# Patient Record
Sex: Male | Born: 1965 | Race: Black or African American | Hispanic: No | Smoking: Current every day smoker
Health system: Southern US, Community
[De-identification: ages and names within clinical notes are randomized; demographics above are authoritative.]

## PROBLEM LIST (undated history)

## (undated) DIAGNOSIS — J111 Influenza due to unidentified influenza virus with other respiratory manifestations: Secondary | ICD-10-CM

## (undated) DIAGNOSIS — G8929 Other chronic pain: Secondary | ICD-10-CM

## (undated) DIAGNOSIS — F32A Depression, unspecified: Secondary | ICD-10-CM

## (undated) DIAGNOSIS — Z9689 Presence of other specified functional implants: Secondary | ICD-10-CM

## (undated) DIAGNOSIS — M549 Dorsalgia, unspecified: Secondary | ICD-10-CM

## (undated) HISTORY — PX: SPINE SURGERY: SHX786

## (undated) HISTORY — DX: Depression, unspecified: F32.A

## (undated) HISTORY — DX: Influenza due to unidentified influenza virus with other respiratory manifestations: J11.1

## (undated) HISTORY — PX: FRACTURE SURGERY: SHX138

---
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
## 2007-06-15 ENCOUNTER — Ambulatory Visit: Payer: Self-pay | Admitting: Family Medicine

## 2013-12-06 ENCOUNTER — Emergency Department: Payer: Self-pay | Admitting: Emergency Medicine

## 2013-12-09 ENCOUNTER — Emergency Department: Payer: Self-pay | Admitting: Student

## 2014-05-16 ENCOUNTER — Emergency Department: Payer: Self-pay | Admitting: Emergency Medicine

## 2014-05-16 LAB — URINALYSIS, COMPLETE
BACTERIA: NONE SEEN
Bilirubin,UR: NEGATIVE
Blood: NEGATIVE
GLUCOSE, UR: NEGATIVE mg/dL (ref 0–75)
KETONE: NEGATIVE
Leukocyte Esterase: NEGATIVE
Nitrite: NEGATIVE
Ph: 6 (ref 4.5–8.0)
Protein: 30
RBC,UR: 1 /HPF (ref 0–5)
SPECIFIC GRAVITY: 1.032 (ref 1.003–1.030)
Squamous Epithelial: <1
WBC UR: 2 /HPF (ref 0–5)

## 2014-05-27 ENCOUNTER — Emergency Department: Payer: Self-pay | Admitting: Emergency Medicine

## 2014-05-27 HISTORY — PX: BACK SURGERY: SHX140

## 2014-05-29 ENCOUNTER — Ambulatory Visit: Payer: Self-pay | Admitting: Family Medicine

## 2015-01-13 ENCOUNTER — Other Ambulatory Visit: Payer: Self-pay | Admitting: Neurosurgery

## 2015-01-17 NOTE — Pre-Procedure Instructions (Addendum)
Wesley Adams  01/17/2015     No Pharmacies Listed   Your procedure is scheduled on Wed, Sept 28 @ 10:30 AM  Report to [facility] Admitting at 7:30 AM.  Call this number if you have problems the morning of surgery:  [phone]   Remember:  Do not eat food or drink liquids after midnight.  Take these medicines the morning of surgery with A SIP OF WATER  ---Pain medicine as needed             No Goody's,BC's,Aspirin,Ibuprofen,Aleve,Fish Oil,or any Herbal Medications.   Do not wear jewelry.  Do not wear lotions, powders, or colognes.  You may wear deodorant.  Men may shave face and neck.  Do not bring valuables to the hospital.  [facility] is not responsible for any belongings or valuables.  Contacts, dentures or bridgework may not be worn into surgery.  Leave your suitcase in the car.  After surgery it may be brought to your room.  For patients admitted to the hospital, discharge time will be determined by your treatment team.  Patients discharged the day of surgery will not be allowed to drive home.    Special instructions:  [place] - Preparing for Surgery  Before surgery, you can play an important role.  Because skin is not sterile, your skin needs to be as free of germs as possible.  You can reduce the number of germs on you skin by washing with CHG (chlorahexidine gluconate) soap before surgery.  CHG is an antiseptic cleaner which kills germs and bonds with the skin to continue killing germs even after washing.  Please DO NOT use if you have an allergy to CHG or antibacterial soaps.  If your skin becomes reddened/irritated stop using the CHG and inform your nurse when you arrive at Short Stay.  Do not shave (including legs and underarms) for at least 48 hours prior to the first CHG shower.  You may shave your face.  Please follow these instructions carefully:   1.  Shower with CHG Soap the night before surgery and the                                 morning of Surgery.  2.  If you choose to wash your hair, wash your hair first as usual with your       normal shampoo.  3.  After you shampoo, rinse your hair and body thoroughly to remove the                      Shampoo.  4.  Use CHG as you would any other liquid soap.  You can apply chg directly       to the skin and wash gently with scrungie or a clean washcloth.  5.  Apply the CHG Soap to your body ONLY FROM THE NECK DOWN.        Do not use on open wounds or open sores.  Avoid contact with your eyes,       ears, mouth and genitals (private parts).  Wash genitals (private parts)       with your normal soap.  6.  Wash thoroughly, paying special attention to the area where your surgery        will be performed.  7.  Thoroughly rinse your body with warm water from the neck down.  8.  DO NOT shower/wash with your normal soap after using and rinsing off       the CHG Soap.  9.  Pat yourself dry with a clean towel.            10.  Wear clean pajamas.            11.  Place clean sheets on your bed the night of your first shower and do not        sleep with pets.  Day of Surgery  Do not apply any lotions/deoderants the morning of surgery.  Please wear clean clothes to the hospital/surgery center.   Please read over the following fact sheets that you were given. Pain Booklet, Coughing and Deep Breathing, Blood Transfusion Information, Surgical Site Infection Prevention and Anesthesia Post-op Instructions

## 2015-01-20 ENCOUNTER — Encounter (HOSPITAL_COMMUNITY): Payer: Self-pay

## 2015-01-20 ENCOUNTER — Encounter (HOSPITAL_COMMUNITY)
Admission: RE | Admit: 2015-01-20 | Discharge: 2015-01-20 | Disposition: A | Discharge status: Home / Self Care | Payer: BLUE CROSS/BLUE SHIELD | Source: Ambulatory Visit | Attending: Neurosurgery | Admitting: Neurosurgery

## 2015-01-20 DIAGNOSIS — Z0183 Encounter for blood typing: Secondary | ICD-10-CM | POA: Diagnosis not present

## 2015-01-20 DIAGNOSIS — Z01812 Encounter for preprocedural laboratory examination: Secondary | ICD-10-CM | POA: Insufficient documentation

## 2015-01-20 DIAGNOSIS — M5136 Other intervertebral disc degeneration, lumbar region: Secondary | ICD-10-CM | POA: Diagnosis not present

## 2015-01-20 LAB — BASIC METABOLIC PANEL
Anion gap: 8 (ref 5–15)
BUN: 11 mg/dL (ref 6–20)
CALCIUM: 9.4 mg/dL (ref 8.9–10.3)
CHLORIDE: 106 mmol/L (ref 101–111)
CO2: 26 mmol/L (ref 22–32)
CREATININE: 0.81 mg/dL (ref 0.61–1.24)
Glucose, Bld: 95 mg/dL (ref 65–99)
Potassium: 4.3 mmol/L (ref 3.5–5.1)
SODIUM: 140 mmol/L (ref 135–145)

## 2015-01-20 LAB — TYPE AND SCREEN
ABO/RH(D): O POS
Antibody Screen: NEGATIVE

## 2015-01-20 LAB — SURGICAL PCR SCREEN
MRSA, PCR: NEGATIVE
Staphylococcus aureus: NEGATIVE

## 2015-01-20 LAB — CBC
HCT: 44 % (ref 39.0–52.0)
Hemoglobin: 14.8 g/dL (ref 13.0–17.0)
MCH: 30.5 pg (ref 26.0–34.0)
MCHC: 33.6 g/dL (ref 30.0–36.0)
MCV: 90.5 fL (ref 78.0–100.0)
PLATELETS: 189 10*3/uL (ref 150–400)
RBC: 4.86 MIL/uL (ref 4.22–5.81)
RDW: 14.3 % (ref 11.5–15.5)
WBC: 7.4 10*3/uL (ref 4.0–10.5)

## 2015-01-20 LAB — ABO/RH: ABO/RH(D): O POS

## 2015-01-20 NOTE — Progress Notes (Signed)
No cardiologist  PCP [place]

## 2015-01-20 NOTE — Pre-Procedure Instructions (Signed)
    TYSHAN ENDERLE  01/20/2015      [id number] - [place], [place] - [address] [place] [address] Phone: [phone] Fax: [phone]    Your procedure is scheduled on Wednesday Sept 28  Report to [facility] Admitting at  7:30 A.M.  Call this number if you have problems the morning of surgery:  [phone]   Remember:  Do not eat food or drink liquids after midnight.  Take these medicines the morning of surgery with A SIP OF WATER none   Do not wear jewelry, make-up or nail polish.  Do not wear lotions, powders, or perfumes.  You may wear deodorant.  Do not shave 48 hours prior to surgery.  Men may shave face and neck.  Do not bring valuables to the hospital.  [facility] is not responsible for any belongings or valuables.  Contacts, dentures or bridgework may not be worn into surgery.  Leave your suitcase in the car.  After surgery it may be brought to your room.  For patients admitted to the hospital, discharge time will be determined by your treatment team.  Patients discharged the day of surgery will not be allowed to drive home.    Special instructions:    Please read over the following fact sheets that you were given. Pain Booklet, Coughing and Deep Breathing, MRSA Information and Surgical Site Infection Prevention

## 2015-01-21 MED ORDER — CEFAZOLIN SODIUM-DEXTROSE 2-3 GM-% IV SOLR: 2.0000 g | INTRAVENOUS | Status: AC

## 2015-01-29 NOTE — Progress Notes (Signed)
Left message on pt's voicemail of time of arrival for tomorrow is 9:15 AM.

## 2015-01-30 ENCOUNTER — Inpatient Hospital Stay (HOSPITAL_COMMUNITY)
Admission: RE | Admit: 2015-01-30 | Discharge: 2015-01-31 | DRG: 460 | Disposition: A | Discharge status: Home / Self Care | Payer: BLUE CROSS/BLUE SHIELD | Source: Ambulatory Visit | Attending: Neurosurgery | Admitting: Neurosurgery

## 2015-01-30 ENCOUNTER — Encounter (HOSPITAL_COMMUNITY): Payer: Self-pay | Admitting: *Deleted

## 2015-01-30 ENCOUNTER — Inpatient Hospital Stay (HOSPITAL_COMMUNITY): Payer: BLUE CROSS/BLUE SHIELD | Admitting: Certified Registered"

## 2015-01-30 ENCOUNTER — Inpatient Hospital Stay (HOSPITAL_COMMUNITY): Payer: BLUE CROSS/BLUE SHIELD

## 2015-01-30 ENCOUNTER — Encounter (HOSPITAL_COMMUNITY)
Admission: RE | Disposition: A | Discharge status: Home / Self Care | Payer: BLUE CROSS/BLUE SHIELD | Source: Ambulatory Visit | Attending: Neurosurgery

## 2015-01-30 DIAGNOSIS — M5116 Intervertebral disc disorders with radiculopathy, lumbar region: Principal | ICD-10-CM | POA: Diagnosis present

## 2015-01-30 DIAGNOSIS — M5136 Other intervertebral disc degeneration, lumbar region: Secondary | ICD-10-CM | POA: Diagnosis present

## 2015-01-30 DIAGNOSIS — F17200 Nicotine dependence, unspecified, uncomplicated: Secondary | ICD-10-CM | POA: Diagnosis present

## 2015-01-30 DIAGNOSIS — M51369 Other intervertebral disc degeneration, lumbar region without mention of lumbar back pain or lower extremity pain: Secondary | ICD-10-CM | POA: Diagnosis present

## 2015-01-30 DIAGNOSIS — M549 Dorsalgia, unspecified: Secondary | ICD-10-CM

## 2015-01-30 SURGERY — POSTERIOR LUMBAR FUSION 1 LEVEL
Anesthesia: General | Site: Back

## 2015-01-30 MED ORDER — EPHEDRINE SULFATE 50 MG/ML IJ SOLN
INTRAMUSCULAR | Status: DC | PRN
  Administered 2015-01-30: 5 mg via INTRAVENOUS

## 2015-01-30 MED ORDER — CEFAZOLIN SODIUM-DEXTROSE 2-3 GM-% IV SOLR
2.0000 g | Freq: Three times a day (TID) | INTRAVENOUS | Status: AC
  Administered 2015-01-30 – 2015-01-31 (×2): 2 g via INTRAVENOUS
  Filled 2015-01-30 (×2): qty 50

## 2015-01-30 MED ORDER — FENTANYL CITRATE (PF) 250 MCG/5ML IJ SOLN
INTRAMUSCULAR | Status: AC
  Filled 2015-01-30: qty 5

## 2015-01-30 MED ORDER — BUPIVACAINE LIPOSOME 1.3 % IJ SUSP
INTRAMUSCULAR | Status: DC | PRN
  Administered 2015-01-30: 20 mL

## 2015-01-30 MED ORDER — 0.9 % SODIUM CHLORIDE (POUR BTL) OPTIME
TOPICAL | Status: DC | PRN
  Administered 2015-01-30: 1000 mL

## 2015-01-30 MED ORDER — ARTIFICIAL TEARS OP OINT
TOPICAL_OINTMENT | OPHTHALMIC | Status: DC | PRN
  Administered 2015-01-30: 1 via OPHTHALMIC

## 2015-01-30 MED ORDER — BUPIVACAINE-EPINEPHRINE (PF) 0.5% -1:200000 IJ SOLN
INTRAMUSCULAR | Status: DC | PRN
  Administered 2015-01-30: 10 mL via PERINEURAL

## 2015-01-30 MED ORDER — ONDANSETRON HCL 4 MG/2ML IJ SOLN: 4.0000 mg | INTRAMUSCULAR | Status: DC | PRN

## 2015-01-30 MED ORDER — MEPERIDINE HCL 25 MG/ML IJ SOLN: 6.2500 mg | INTRAMUSCULAR | Status: DC | PRN

## 2015-01-30 MED ORDER — ARTIFICIAL TEARS OP OINT
TOPICAL_OINTMENT | OPHTHALMIC | Status: AC
  Filled 2015-01-30: qty 3.5

## 2015-01-30 MED ORDER — VANCOMYCIN HCL 1000 MG IV SOLR
INTRAVENOUS | Status: DC | PRN
  Administered 2015-01-30: 1000 mg

## 2015-01-30 MED ORDER — BACITRACIN ZINC 500 UNIT/GM EX OINT
TOPICAL_OINTMENT | CUTANEOUS | Status: DC | PRN
  Administered 2015-01-30: 1 via TOPICAL

## 2015-01-30 MED ORDER — OXYCODONE-ACETAMINOPHEN 5-325 MG PO TABS
1.0000 | ORAL_TABLET | ORAL | Status: DC | PRN
  Administered 2015-01-31 (×3): 2 via ORAL
  Filled 2015-01-30 (×3): qty 2

## 2015-01-30 MED ORDER — PHENOL 1.4 % MT LIQD: 1.0000 | OROMUCOSAL | Status: DC | PRN

## 2015-01-30 MED ORDER — LACTATED RINGERS IV SOLN
INTRAVENOUS | Status: DC
  Administered 2015-01-30: 18:00:00 via INTRAVENOUS

## 2015-01-30 MED ORDER — BUPIVACAINE LIPOSOME 1.3 % IJ SUSP
20.0000 mL | Freq: Once | INTRAMUSCULAR | Status: DC
  Filled 2015-01-30: qty 20

## 2015-01-30 MED ORDER — MIDAZOLAM HCL 2 MG/2ML IJ SOLN
INTRAMUSCULAR | Status: AC
  Filled 2015-01-30: qty 4

## 2015-01-30 MED ORDER — THROMBIN 20000 UNITS EX SOLR
CUTANEOUS | Status: DC | PRN
  Administered 2015-01-30: 20 mL via TOPICAL

## 2015-01-30 MED ORDER — DEXAMETHASONE SODIUM PHOSPHATE 10 MG/ML IJ SOLN
INTRAMUSCULAR | Status: AC
  Filled 2015-01-30: qty 1

## 2015-01-30 MED ORDER — HYDROMORPHONE HCL 1 MG/ML IJ SOLN
INTRAMUSCULAR | Status: AC
  Filled 2015-01-30: qty 1

## 2015-01-30 MED ORDER — LIDOCAINE HCL (CARDIAC) 20 MG/ML IV SOLN
INTRAVENOUS | Status: DC | PRN
  Administered 2015-01-30: 100 mg via INTRAVENOUS

## 2015-01-30 MED ORDER — MORPHINE SULFATE (PF) 2 MG/ML IV SOLN
1.0000 mg | INTRAVENOUS | Status: DC | PRN
  Administered 2015-01-30: 2 mg via INTRAVENOUS
  Administered 2015-01-30 – 2015-01-31 (×2): 4 mg via INTRAVENOUS
  Filled 2015-01-30: qty 2
  Filled 2015-01-30: qty 1
  Filled 2015-01-30: qty 2

## 2015-01-30 MED ORDER — ONDANSETRON HCL 4 MG/2ML IJ SOLN
INTRAMUSCULAR | Status: AC
  Filled 2015-01-30: qty 2

## 2015-01-30 MED ORDER — ROCURONIUM BROMIDE 100 MG/10ML IV SOLN
INTRAVENOUS | Status: DC | PRN
  Administered 2015-01-30: 50 mg via INTRAVENOUS

## 2015-01-30 MED ORDER — DEXAMETHASONE SODIUM PHOSPHATE 10 MG/ML IJ SOLN
INTRAMUSCULAR | Status: DC | PRN
  Administered 2015-01-30: 10 mg via INTRAVENOUS

## 2015-01-30 MED ORDER — PROPOFOL 10 MG/ML IV BOLUS
INTRAVENOUS | Status: DC | PRN
  Administered 2015-01-30: 150 mg via INTRAVENOUS
  Administered 2015-01-30: 50 mg via INTRAVENOUS

## 2015-01-30 MED ORDER — CEFAZOLIN SODIUM-DEXTROSE 2-3 GM-% IV SOLR
INTRAVENOUS | Status: AC
Start: 2015-01-30 — End: 2015-01-30
  Administered 2015-01-30: 2 g via INTRAVENOUS
  Filled 2015-01-30: qty 50

## 2015-01-30 MED ORDER — MIDAZOLAM HCL 5 MG/5ML IJ SOLN
INTRAMUSCULAR | Status: DC | PRN
  Administered 2015-01-30: 2 mg via INTRAVENOUS

## 2015-01-30 MED ORDER — ALUM & MAG HYDROXIDE-SIMETH 200-200-20 MG/5ML PO SUSP: 30.0000 mL | Freq: Four times a day (QID) | ORAL | Status: DC | PRN

## 2015-01-30 MED ORDER — VANCOMYCIN HCL 1000 MG IV SOLR
INTRAVENOUS | Status: AC
  Filled 2015-01-30: qty 1000

## 2015-01-30 MED ORDER — ACETAMINOPHEN 650 MG RE SUPP: 650.0000 mg | RECTAL | Status: DC | PRN

## 2015-01-30 MED ORDER — DOCUSATE SODIUM 100 MG PO CAPS
100.0000 mg | ORAL_CAPSULE | Freq: Two times a day (BID) | ORAL | Status: DC
  Administered 2015-01-30 – 2015-01-31 (×2): 100 mg via ORAL
  Filled 2015-01-30 (×2): qty 1

## 2015-01-30 MED ORDER — HYDROCODONE-ACETAMINOPHEN 5-325 MG PO TABS
1.0000 | ORAL_TABLET | ORAL | Status: DC | PRN
Start: 2015-01-30 — End: 2015-01-31

## 2015-01-30 MED ORDER — BACITRACIN 50000 UNITS IM SOLR
INTRAMUSCULAR | Status: DC | PRN
  Administered 2015-01-30: 500 mL

## 2015-01-30 MED ORDER — ONDANSETRON HCL 4 MG/2ML IJ SOLN: 4.0000 mg | Freq: Once | INTRAMUSCULAR | Status: DC | PRN

## 2015-01-30 MED ORDER — DIAZEPAM 5 MG PO TABS: 5.0000 mg | ORAL_TABLET | Freq: Four times a day (QID) | ORAL | Status: DC | PRN

## 2015-01-30 MED ORDER — HYDROMORPHONE HCL 1 MG/ML IJ SOLN
0.2500 mg | INTRAMUSCULAR | Status: DC | PRN
Start: 2015-01-30 — End: 2015-01-30
  Administered 2015-01-30 (×2): 0.5 mg via INTRAVENOUS

## 2015-01-30 MED ORDER — MENTHOL 3 MG MT LOZG: 1.0000 | LOZENGE | OROMUCOSAL | Status: DC | PRN

## 2015-01-30 MED ORDER — BISACODYL 10 MG RE SUPP: 10.0000 mg | Freq: Every day | RECTAL | Status: DC | PRN

## 2015-01-30 MED ORDER — LACTATED RINGERS IV SOLN
INTRAVENOUS | Status: DC
  Administered 2015-01-30 (×3): via INTRAVENOUS

## 2015-01-30 MED ORDER — ONDANSETRON HCL 4 MG/2ML IJ SOLN
INTRAMUSCULAR | Status: DC | PRN
Start: 2015-01-30 — End: 2015-01-30
  Administered 2015-01-30: 4 mg via INTRAVENOUS

## 2015-01-30 MED ORDER — ACETAMINOPHEN 325 MG PO TABS: 650.0000 mg | ORAL_TABLET | ORAL | Status: DC | PRN

## 2015-01-30 MED ORDER — FENTANYL CITRATE (PF) 250 MCG/5ML IJ SOLN
INTRAMUSCULAR | Status: DC | PRN
  Administered 2015-01-30 (×5): 50 ug via INTRAVENOUS
  Administered 2015-01-30: 100 ug via INTRAVENOUS
  Administered 2015-01-30 (×6): 50 ug via INTRAVENOUS

## 2015-01-30 MED FILL — Heparin Sodium (Porcine) Inj 1000 Unit/ML: INTRAMUSCULAR | Qty: 30 | Status: AC

## 2015-01-30 MED FILL — Sodium Chloride IV Soln 0.9%: INTRAVENOUS | Qty: 1000 | Status: AC

## 2015-01-30 SURGICAL SUPPLY — 62 items
BAG DECANTER FOR FLEXI CONT (MISCELLANEOUS) ×2 IMPLANT
BENZOIN TINCTURE PRP APPL 2/3 (GAUZE/BANDAGES/DRESSINGS) ×2 IMPLANT
BLADE CLIPPER SURG (BLADE) IMPLANT
BRUSH SCRUB EZ PLAIN DRY (MISCELLANEOUS) ×2 IMPLANT
BUR MATCHSTICK NEURO 3.0 LAGG (BURR) ×2 IMPLANT
BUR PRECISION FLUTE 6.0 (BURR) ×2 IMPLANT
CAGE ALTERA 10X31X9-13 15D (Cage) ×2 IMPLANT
CANISTER SUCT 3000ML PPV (MISCELLANEOUS) ×2 IMPLANT
CAP REVERE LOCKING (Cap) ×8 IMPLANT
CONT SPEC 4OZ CLIKSEAL STRL BL (MISCELLANEOUS) ×2 IMPLANT
COVER BACK TABLE 60X90IN (DRAPES) ×2 IMPLANT
DRAPE C-ARM 42X72 X-RAY (DRAPES) ×4 IMPLANT
DRAPE LAPAROTOMY 100X72X124 (DRAPES) ×2 IMPLANT
DRAPE POUCH INSTRU U-SHP 10X18 (DRAPES) ×2 IMPLANT
DRAPE PROXIMA HALF (DRAPES) ×2 IMPLANT
DRAPE SURG 17X23 STRL (DRAPES) ×8 IMPLANT
ELECT BLADE 4.0 EZ CLEAN MEGAD (MISCELLANEOUS) ×2
ELECT REM PT RETURN 9FT ADLT (ELECTROSURGICAL) ×2
ELECTRODE BLDE 4.0 EZ CLN MEGD (MISCELLANEOUS) ×1 IMPLANT
ELECTRODE REM PT RTRN 9FT ADLT (ELECTROSURGICAL) ×1 IMPLANT
EVACUATOR 1/8 PVC DRAIN (DRAIN) IMPLANT
GAUZE SPONGE 4X4 12PLY STRL (GAUZE/BANDAGES/DRESSINGS) ×2 IMPLANT
GAUZE SPONGE 4X4 16PLY XRAY LF (GAUZE/BANDAGES/DRESSINGS) ×2 IMPLANT
GLOVE BIO SURGEON STRL SZ8 (GLOVE) ×4 IMPLANT
GLOVE BIO SURGEON STRL SZ8.5 (GLOVE) ×4 IMPLANT
GLOVE BIOGEL PI IND STRL 6.5 (GLOVE) ×1 IMPLANT
GLOVE BIOGEL PI INDICATOR 6.5 (GLOVE) ×1
GLOVE ECLIPSE 6.5 STRL STRAW (GLOVE) ×2 IMPLANT
GLOVE EXAM NITRILE LRG STRL (GLOVE) IMPLANT
GLOVE EXAM NITRILE MD LF STRL (GLOVE) IMPLANT
GLOVE EXAM NITRILE XL STR (GLOVE) IMPLANT
GLOVE EXAM NITRILE XS STR PU (GLOVE) IMPLANT
GOWN STRL REUS W/ TWL LRG LVL3 (GOWN DISPOSABLE) IMPLANT
GOWN STRL REUS W/ TWL XL LVL3 (GOWN DISPOSABLE) ×2 IMPLANT
GOWN STRL REUS W/TWL 2XL LVL3 (GOWN DISPOSABLE) IMPLANT
GOWN STRL REUS W/TWL LRG LVL3 (GOWN DISPOSABLE)
GOWN STRL REUS W/TWL XL LVL3 (GOWN DISPOSABLE) ×2
KIT BASIN OR (CUSTOM PROCEDURE TRAY) ×2 IMPLANT
KIT ROOM TURNOVER OR (KITS) ×2 IMPLANT
NEEDLE HYPO 21X1.5 SAFETY (NEEDLE) IMPLANT
NEEDLE HYPO 22GX1.5 SAFETY (NEEDLE) ×2 IMPLANT
NS IRRIG 1000ML POUR BTL (IV SOLUTION) ×2 IMPLANT
PACK LAMINECTOMY NEURO (CUSTOM PROCEDURE TRAY) ×2 IMPLANT
PAD ARMBOARD 7.5X6 YLW CONV (MISCELLANEOUS) ×6 IMPLANT
PATTIES SURGICAL .5 X1 (DISPOSABLE) IMPLANT
ROD REVERE 6.35 45MM (Rod) ×4 IMPLANT
SCREW 7.5X50MM (Screw) ×8 IMPLANT
SPONGE LAP 4X18 X RAY DECT (DISPOSABLE) IMPLANT
SPONGE NEURO XRAY DETECT 1X3 (DISPOSABLE) IMPLANT
SPONGE SURGIFOAM ABS GEL 100 (HEMOSTASIS) ×2 IMPLANT
STRIP BIOACTIVE 20CC 25X100X8 (Miscellaneous) ×2 IMPLANT
STRIP BIOACTIVE 5CC 25X50X4MM (Miscellaneous) ×2 IMPLANT
STRIP CLOSURE SKIN 1/2X4 (GAUZE/BANDAGES/DRESSINGS) ×2 IMPLANT
SUT VIC AB 1 CT1 18XBRD ANBCTR (SUTURE) ×2 IMPLANT
SUT VIC AB 1 CT1 8-18 (SUTURE) ×2
SUT VIC AB 2-0 CP2 18 (SUTURE) ×4 IMPLANT
TAPE CLOTH SURG 4X10 WHT LF (GAUZE/BANDAGES/DRESSINGS) ×2 IMPLANT
TAPE STRIPS DRAPE STRL (GAUZE/BANDAGES/DRESSINGS) ×2 IMPLANT
TOWEL OR 17X24 6PK STRL BLUE (TOWEL DISPOSABLE) ×2 IMPLANT
TOWEL OR 17X26 10 PK STRL BLUE (TOWEL DISPOSABLE) ×2 IMPLANT
TRAY FOLEY W/METER SILVER 14FR (SET/KITS/TRAYS/PACK) ×2 IMPLANT
WATER STERILE IRR 1000ML POUR (IV SOLUTION) ×2 IMPLANT

## 2015-01-30 NOTE — Anesthesia Postprocedure Evaluation (Signed)
Anesthesia Post Note  Patient: Wesley Adams  Procedure(s) Performed: Procedure(s) (LRB): POSTERIOR LUMBAR FUSION 1 LEVEL (N/A)  Anesthesia type: general  Patient location: PACU  Post pain: Pain level controlled  Post assessment: Patient's Cardiovascular Status Stable  Last Vitals:  Filed Vitals:   01/30/15 1530  BP:   Pulse: 73  Temp:   Resp: 18    Post vital signs: Reviewed and stable  Level of consciousness: sedated  Complications: No apparent anesthesia complications

## 2015-01-30 NOTE — Anesthesia Preprocedure Evaluation (Signed)
Anesthesia Evaluation  Patient identified by MRN, date of birth, ID band Patient awake    Reviewed: Allergy & Precautions, NPO status , Patient's Chart, lab work & pertinent test results  Airway Mallampati: I  TM Distance: >3 FB Neck ROM: Full    Dental   Pulmonary Current Smoker,    Pulmonary exam normal        Cardiovascular Normal cardiovascular exam     Neuro/Psych    GI/Hepatic   Endo/Other    Renal/GU      Musculoskeletal   Abdominal   Peds  Hematology   Anesthesia Other Findings   Reproductive/Obstetrics                             Anesthesia Physical Anesthesia Plan  ASA: II  Anesthesia Plan: General   Post-op Pain Management:    Induction: Intravenous  Airway Management Planned: Oral ETT  Additional Equipment:   Intra-op Plan:   Post-operative Plan: Extubation in OR  Informed Consent: I have reviewed the patients History and Physical, chart, labs and discussed the procedure including the risks, benefits and alternatives for the proposed anesthesia with the patient or authorized representative who has indicated his/her understanding and acceptance.     Plan Discussed with: CRNA and Surgeon  Anesthesia Plan Comments:         Anesthesia Quick Evaluation  

## 2015-01-30 NOTE — Op Note (Signed)
Brief history: The patient is a 49 year old black male on whom I performed an L4-5 discectomy. He has had persistent back and leg pain. He has failed medical management. He was worked up with a lumbar MRI which demonstrated disc degeneration most prominent at L4-5. I discussed the various treatment options with the patient including surgery. He has weighed the risks, benefits, and alternative surgery and decided proceed with an L4-5 decompression, instrumentation, and fusion.  Preoperative diagnosis: L4-5 Degenerative disc disease, spinal stenosis compressing both the L4 and the L5 nerve roots; lumbago; lumbar radiculopathy  Postoperative diagnosis: The same  Procedure: Bilateral L4-5 Laminotomy/foraminotomies to decompress the bilateral L4 and L5 nerve roots(the work required to do this was in addition to the work required to do the posterior lumbar interbody fusion because of the patient's spinal stenosis, facet arthropathy. Etc. requiring a wide decompression of the nerve roots.); L4-5 transforaminal lumbar interbody fusion with local morselized autograft bone and Kinnex graft extender; insertion of interbody prosthesis at L4-5 (globus peek expandable interbody prosthesis); posterior nonsegmental instrumentation from L4 to L5 with globus titanium pedicle screws and rods; posterior lateral arthrodesis at L4-5 with local morselized autograft bone and Kinnex bone graft extender.  Surgeon: Dr. Earle Gell  Asst.: Dr. Sherley Bounds  Anesthesia: Gen. endotracheal  Estimated blood loss: 250 mL  Drains: One medium Hemovac  Complications: None  Description of procedure: The patient was brought to the operating room by the anesthesia team. General endotracheal anesthesia was induced. The patient was turned to the prone position on the Wilson frame. The patient's lumbosacral region was then prepared with Betadine scrub and Betadine solution. Sterile drapes were applied.  I then injected the area to be  incised with Marcaine with epinephrine solution. I then used the scalpel to make a linear midline incision over the L4-5 interspace, incising through the old surgical scar. I then used electrocautery to perform a bilateral subperiosteal dissection exposing the spinous process and lamina of L4 and L5. We then obtained intraoperative radiograph to confirm our location. We then inserted the Verstrac retractor to provide exposure.  I began the decompression by using the high speed drill to perform laminotomies at L4-5 bilaterally. We then used the Kerrison punches to widen the laminotomy and removed the ligamentum flavum at L4-5 bilaterally. We used the Kerrison punches to remove the medial facets at L4-5 bilaterally. We performed wide foraminotomies about the bilateral L4 and L5 nerve roots completing the decompression.  We now turned our attention to the posterior lumbar interbody fusion. I used a scalpel to incise the intervertebral disc at L4-5 bilaterally. I then performed a partial intervertebral discectomy at L4-5 bilaterally using the pituitary forceps. We prepared the vertebral endplates at Y1-8 bilaterally for the fusion by removing the soft tissues with the curettes. We then used the trial spacers to pick the appropriate sized interbody prosthesis. We prefilled his prosthesis with a combination of local morselized autograft bone that we obtained during the decompression as well as Kinnex bone graft extender. We inserted the prefilled prosthesis into the interspace at L4-5. I then expanded the prosthesis. There was a good snug fit of the prosthesis in the interspace. We then filled and the remainder of the intervertebral disc space with local morselized autograft bone and Kinnex. This completed the posterior lumbar interbody arthrodesis.  We now turned attention to the instrumentation. Under fluoroscopic guidance we cannulated the bilateral L4 and L5 pedicles with the bone probe. We then removed the bone  probe. We then tapped  the pedicle with a 6.5 millimeter tap. We then removed the tap. We probed inside the tapped pedicle with a ball probe to rule out cortical breaches. We then inserted a 7.5 x 50 millimeter pedicle screw into the L4 and L5 pedicles bilaterally under fluoroscopic guidance. We then palpated along the medial aspect of the pedicles to rule out cortical breaches. There were none. The nerve roots were not injured. We then connected the unilateral pedicle screws with a lordotic rod. We compressed the construct and secured the rod in place with the caps. We then tightened the caps appropriately. This completed the instrumentation from L4-5.  We now turned our attention to the posterior lateral arthrodesis at L4-5. We used the high-speed drill to decorticate the remainder of the facets, pars, transverse process at L4-5. We then applied a combination of local morselized autograft bone and Kinnex bone graft extender over these decorticated posterior lateral structures. This completed the posterior lateral arthrodesis.  We then obtained hemostasis using bipolar electrocautery. We irrigated the wound out with bacitracin solution. We inspected the thecal sac and nerve roots and noted they were well decompressed. We then removed the retractor. I placed vancomycin powder in the wound. We placed a medium Hemovac drain in the epidural space and tunneled out through separate stab wound. We reapproximated patient's thoracolumbar fascia with interrupted #1 Vicryl suture. We reapproximated patient's subcutaneous tissue with interrupted 2-0 Vicryl suture. The reapproximated patient's skin with Steri-Strips and benzoin. The wound was then coated with bacitracin ointment. A sterile dressing was applied. The drapes were removed. The patient was subsequently returned to the supine position where they were extubated by the anesthesia team. He was then transported to the post anesthesia care unit in stable condition. All  sponge instrument and needle counts were reportedly correct at the end of this case.

## 2015-01-30 NOTE — Anesthesia Procedure Notes (Signed)
Procedure Name: Intubation Date/Time: 01/30/2015 11:21 AM Performed by: Merrilyn Puma B Pre-anesthesia Checklist: Patient identified, Timeout performed, Emergency Drugs available, Suction available and Patient being monitored Patient Re-evaluated:Patient Re-evaluated prior to inductionOxygen Delivery Method: Circle system utilized Preoxygenation: Pre-oxygenation with 100% oxygen Intubation Type: IV induction and Cricoid Pressure applied Ventilation: Mask ventilation without difficulty Laryngoscope Size: Mac and 4 Grade View: Grade III Tube type: Oral Tube size: 8.0 mm Number of attempts: 1 Airway Equipment and Method: Stylet Placement Confirmation: CO2 detector,  positive ETCO2,  ETT inserted through vocal cords under direct vision and breath sounds checked- equal and bilateral Secured at: 23 cm Tube secured with: Tape Dental Injury: Teeth and Oropharynx as per pre-operative assessment

## 2015-01-30 NOTE — H&P (Signed)
Subjective: The patient is a 49 year old black male on whom I previously performed a lumbar discectomy. He's had persistent back pain. He has failed medical management and was worked up with a lumbar MRI. This demonstrates disc degeneration at L4-5. I discussed the various treatment options with the patient. He has weighed the risks, benefits, and alternative surgery and decided to proceed with an L4-5 decompression, instrumentation, and fusion.   Past Medical History  Diagnosis Date  . Medical history non-contributory     Past Surgical History  Procedure Laterality Date  . Back surgery  Feb 2016    cut disc off of nerve with Dr Arnoldo Morale  . Fracture surgery Right as child    rod in right femur    No Known Allergies  Social History  Substance Use Topics  . Smoking status: Current Every Day Smoker -- 1.00 packs/day for 25 years    Types: Cigarettes  . Smokeless tobacco: Not on file  . Alcohol Use: 1.2 - 1.8 oz/week    2-3 Cans of beer per week     Comment: beer on weekend    History reviewed. No pertinent family history. Prior to Admission medications   Medication Sig Start Date End Date Taking? Authorizing Provider  ibuprofen (ADVIL,MOTRIN) 200 MG tablet Take 400 mg by mouth every 8 (eight) hours as needed for mild pain or moderate pain.   Yes Historical Provider, MD     Review of Systems  Positive ROS: As above  All other systems have been reviewed and were otherwise negative with the exception of those mentioned in the HPI and as above.  Objective: Vital signs in last 24 hours: Temp:  [97.2 F (36.2 C)] 97.2 F (36.2 C) (10/06 0921) Pulse Rate:  [56] 56 (10/06 0921) Resp:  [20] 20 (10/06 0921) BP: (142)/(72) 142/72 mmHg (10/06 0921) SpO2:  [100 %] 100 % (10/06 0921) Weight:  [91.173 kg (201 lb)] 91.173 kg (201 lb) (10/06 0936)  General Appearance: Alert, cooperative, no distress, Head: Normocephalic, without obvious abnormality, atraumatic Eyes: PERRL,  conjunctiva/corneas clear, EOM's intact,    Ears: Normal  Throat: Normal  Neck: Supple, symmetrical, trachea midline, no adenopathy; thyroid: No enlargement/tenderness/nodules; no carotid bruit or JVD Back: Symmetric, no curvature, ROM normal, no CVA tenderness. The patient's lumbar incision is well-healed. Lungs: Clear to auscultation bilaterally, respirations unlabored Heart: Regular rate and rhythm, no murmur, rub or gallop Abdomen: Soft, non-tender,, no masses, no organomegaly Extremities: Extremities normal, atraumatic, no cyanosis or edema Pulses: 2+ and symmetric all extremities Skin: Skin color, texture, turgor normal, no rashes or lesions  NEUROLOGIC:   Mental status: alert and oriented, no aphasia, good attention span, Fund of knowledge/ memory ok Motor Exam - grossly normal Sensory Exam - grossly normal Reflexes:  Coordination - grossly normal Gait - grossly normal Balance - grossly normal Cranial Nerves: I: smell Not tested  II: visual acuity  OS: Normal  OD: Normal   II: visual fields Full to confrontation  II: pupils Equal, round, reactive to light  III,VII: ptosis None  III,IV,VI: extraocular muscles  Full ROM  V: mastication Normal  V: facial light touch sensation  Normal  V,VII: corneal reflex  Present  VII: facial muscle function - upper  Normal  VII: facial muscle function - lower Normal  VIII: hearing Not tested  IX: soft palate elevation  Normal  IX,X: gag reflex Present  XI: trapezius strength  5/5  XI: sternocleidomastoid strength 5/5  XI: neck flexion strength  5/5  XII: tongue strength  Normal    Data Review Lab Results  Component Value Date   WBC 7.4 01/20/2015   HGB 14.8 01/20/2015   HCT 44.0 01/20/2015   MCV 90.5 01/20/2015   PLT 189 01/20/2015   Lab Results  Component Value Date   NA 140 01/20/2015   K 4.3 01/20/2015   CL 106 01/20/2015   CO2 26 01/20/2015   BUN 11 01/20/2015   CREATININE 0.81 01/20/2015   GLUCOSE 95 01/20/2015    No results found for: INR, PROTIME  Assessment/Plan: L4-5 disc degeneration, lumbago, lumbar radiculopathy: I have discussed the situation with the patient. I have reviewed his imaging studies with him and pointed out the abnormalities. We have discussed the various treatment options including surgery. I have described the surgical treatment option of an L4-5 decompression, instrumentation, and fusion. I have shown him surgical models. We have discussed the risks, benefits, alternatives, and likelihood of achieving her goals with surgery. I have answered all the patient's questions. He has decided to proceed with surgery.   Jhayla Podgorski D 01/30/2015 10:49 AM

## 2015-01-30 NOTE — Transfer of Care (Signed)
Immediate Anesthesia Transfer of Care Note  Patient: Wesley Adams  Procedure(s) Performed: Procedure(s) with comments: POSTERIOR LUMBAR FUSION 1 LEVEL (N/A) - L45 posterior lumbar interbody fusion with interbody prosthesis posterior lateral arthrodesis and posterior nonsegmental instrumentation  Patient Location: PACU  Anesthesia Type:General  Level of Consciousness: sedated  Airway & Oxygen Therapy: Patient Spontanous Breathing and Patient connected to nasal cannula oxygen  Post-op Assessment: Report given to RN, Post -op Vital signs reviewed and stable and Patient moving all extremities  Post vital signs: Reviewed and stable  Last Vitals:  Filed Vitals:   01/30/15 0921  BP: 142/72  Pulse: 56  Temp: 36.2 C  Resp: 20  HR 71, RR 12, BP 113/59, Sats 863%  Complications: No apparent anesthesia complications

## 2015-01-31 LAB — CBC
HCT: 36.8 % — ABNORMAL LOW (ref 39.0–52.0)
Hemoglobin: 12 g/dL — ABNORMAL LOW (ref 13.0–17.0)
MCH: 29.2 pg (ref 26.0–34.0)
MCHC: 32.6 g/dL (ref 30.0–36.0)
MCV: 89.5 fL (ref 78.0–100.0)
PLATELETS: 174 10*3/uL (ref 150–400)
RBC: 4.11 MIL/uL — AB (ref 4.22–5.81)
RDW: 13.8 % (ref 11.5–15.5)
WBC: 15.1 10*3/uL — AB (ref 4.0–10.5)

## 2015-01-31 LAB — BASIC METABOLIC PANEL
ANION GAP: 12 (ref 5–15)
BUN: 6 mg/dL (ref 6–20)
CALCIUM: 8.8 mg/dL — AB (ref 8.9–10.3)
CO2: 25 mmol/L (ref 22–32)
Chloride: 101 mmol/L (ref 101–111)
Creatinine, Ser: 0.81 mg/dL (ref 0.61–1.24)
GFR calc Af Amer: >60 mL/min (ref >60)
GLUCOSE: 119 mg/dL — AB (ref 65–99)
POTASSIUM: 3.9 mmol/L (ref 3.5–5.1)
SODIUM: 138 mmol/L (ref 135–145)

## 2015-01-31 MED ORDER — CYCLOBENZAPRINE HCL 10 MG PO TABS: 10.0000 mg | ORAL_TABLET | Freq: Three times a day (TID) | ORAL | Status: DC | PRN

## 2015-01-31 MED ORDER — DOCUSATE SODIUM 100 MG PO CAPS: 100.0000 mg | ORAL_CAPSULE | Freq: Two times a day (BID) | ORAL | Status: DC

## 2015-01-31 MED ORDER — OXYCODONE-ACETAMINOPHEN 10-325 MG PO TABS: 1.0000 | ORAL_TABLET | ORAL | Status: DC | PRN

## 2015-01-31 NOTE — Progress Notes (Signed)
Patient discharged home with significant other. IV removed and hemovac discontinued. Discharge information given. Left unit via wheelchair with volunteer services at 1217pm.

## 2015-01-31 NOTE — Evaluation (Signed)
Physical Therapy Evaluation Patient Details Name: Wesley Adams MRN: [id number] DOB: [birth year] Today's Date: 01/31/2015   History of Present Illness  pt presents with L4-5 PLIF and hx of Lumbar diskectomy.    Clinical Impression  Pt moving great and demonstrates good safety with mobility.  Pt ed on back precautions and continued mobility at home.  No further PT needs at this time, will sign off.      Follow Up Recommendations No PT follow up;Supervision - Intermittent    Equipment Recommendations  None recommended by PT    Recommendations for Other Services       Precautions / Restrictions Precautions Precautions: Back Precaution Booklet Issued: Yes (comment) Precaution Comments: Reviewed back precautions Required Braces or Orthoses: Spinal Brace Spinal Brace: Lumbar corset;Applied in sitting position Restrictions Weight Bearing Restrictions: No      Mobility  Bed Mobility Overal bed mobility: Modified Independent             General bed mobility comments: Cues initially for technique, but no physical A needed.    Transfers Overall transfer level: Modified independent Equipment used: None             General transfer comment: pt demonstrates good technique and safety.    Ambulation/Gait Ambulation/Gait assistance: Modified independent (Device/Increase time) Ambulation Distance (Feet): 200 Feet Assistive device: None Gait Pattern/deviations: WFL(Within Functional Limits)     General Gait Details: pt initially moving slowly and cautiously, but noted to have more fluid gait the further he ambulated.    Stairs Stairs: Yes Stairs assistance: Supervision Stair Management: One rail Left;Alternating pattern;Forwards Number of Stairs: 11 General stair comments: cues for safety on stairs with pt returning demonstration.    Wheelchair Mobility    Modified Rankin (Stroke Patients Only)       Balance Overall balance assessment: No apparent balance  deficits (not formally assessed)                                           Pertinent Vitals/Pain Pain Assessment: 0-10 Pain Score: 3  Pain Location: Back Pain Descriptors / Indicators: Sore Pain Intervention(s): Monitored during session;Premedicated before session;Repositioned    Home Living Family/patient expects to be discharged to:: Private residence Living Arrangements: Spouse/significant other;Children Available Help at Discharge: Family;Available 24 hours/day Type of Home: House Home Access: Stairs to enter Entrance Stairs-Rails: Left Entrance Stairs-Number of Steps: 3 Home Layout: One level Home Equipment: None      Prior Function Level of Independence: Independent               Hand Dominance        Extremity/Trunk Assessment   Upper Extremity Assessment: Overall WFL for tasks assessed           Lower Extremity Assessment: Overall WFL for tasks assessed      Cervical / Trunk Assessment: Normal  Communication   Communication: No difficulties  Cognition Arousal/Alertness: Awake/alert Behavior During Therapy: WFL for tasks assessed/performed Overall Cognitive Status: Within Functional Limits for tasks assessed                      General Comments      Exercises        Assessment/Plan    PT Assessment Patent does not need any further PT services  PT Diagnosis Difficulty walking   PT Problem List  PT Treatment Interventions     PT Goals (Current goals can be found in the Care Plan section) Acute Rehab PT Goals PT Goal Formulation: All assessment and education complete, DC therapy    Frequency     Barriers to discharge        Co-evaluation               End of Session Equipment Utilized During Treatment: Back brace Activity Tolerance: Patient tolerated treatment well Patient left: in chair;with call bell/phone within reach Nurse Communication: Mobility status         Time: 3094-0768 PT  Time Calculation (min) (ACUTE ONLY): 21 min   Charges:   PT Evaluation $Initial PT Evaluation Tier I: 1 Procedure     PT G CodesCatarina Hartshorn, [place] 01/31/2015, 11:01 AM

## 2015-01-31 NOTE — Care Management Note (Signed)
Case Management Note  Patient Details  Name: AZAEL RAGAIN MRN: [id number] Date of Birth: December 28, 1965  Subjective/Objective:                    Action/Plan: Patient admitted with lumbar degenerative disc disease. Pt had a L1 PLIF. Patient is from home with spouse. CM will continue to follow for discharge needs.   Expected Discharge Date:   (pending)               Expected Discharge Plan:  Home/Self Care  In-House Referral:     Discharge planning Services     Post Acute Care Choice:    Choice offered to:     DME Arranged:    DME Agency:     HH Arranged:    HH Agency:     Status of Service:  In process, will continue to follow  Medicare Important Message Given:    Date Medicare IM Given:    Medicare IM give by:    Date Additional Medicare IM Given:    Additional Medicare Important Message give by:     If discussed at [place] of Stay Meetings, dates discussed:    Additional Comments:  Pollie Friar, RN 01/31/2015, 10:37 AM

## 2015-01-31 NOTE — Progress Notes (Signed)
Ortho tech paged about pt back brace, and they notified nurse that it is coming from an outside vendor.  The brace should be here tomorrow.

## 2015-01-31 NOTE — Discharge Summary (Signed)
  Physician Discharge Summary  Patient ID: Wesley Adams MRN: [id number] DOB/AGE: 1965-11-19 49 y.o.  Admit date: 01/30/2015 Discharge date: 01/31/2015  Admission Diagnoses: L4-5 degenerative disc disease, lumbago, lumbar radiculopathy  Discharge Diagnoses: The same Active Problems:   Lumbar degenerative disc disease   Discharged Condition: good  Hospital Course: I performed an L4-5 decompression, instrumentation, and fusion on the patient on 01/30/2015. The surgery went well.  The patient's postoperative course was unremarkable. On postoperative day #1 he requested discharge to home. He was given oral and written discharge instructions. All his questions were answered. The plan is to send him home if he can urinate well as the Foley catheter has just been removed recently.  Consults: None Significant Diagnostic Studies: None Treatments: L4-5 decompression, agitation, and fusion. Discharge Exam: Blood pressure 117/60, pulse 55, temperature 98 F (36.7 C), temperature source Oral, resp. rate 18, height 6\' 1"  (1.854 m), weight 91.173 kg (201 lb), SpO2 99 %. The patient is alert and pleasant. His strength is normal in his lower extremities. He looks well. His dressing is clean and dry.  Disposition: Home     Medication List    STOP taking these medications        ibuprofen 200 MG tablet  Commonly known as:  ADVIL,MOTRIN      TAKE these medications        cyclobenzaprine 10 MG tablet  Commonly known as:  FLEXERIL  Take 1 tablet (10 mg total) by mouth 3 (three) times daily as needed for muscle spasms.     docusate sodium 100 MG capsule  Commonly known as:  COLACE  Take 1 capsule (100 mg total) by mouth 2 (two) times daily.     oxyCODONE-acetaminophen 10-325 MG tablet  Commonly known as:  PERCOCET  Take 1 tablet by mouth every 4 (four) hours as needed for pain.         SignedOphelia Charter 01/31/2015, 7:42 AM

## 2015-01-31 NOTE — Progress Notes (Signed)
Spoke with [employer], Fritz Pickerel regarding back brace. Gave height and weight measurements. Bio Tech will check on status of brace. Wendee Copp

## 2015-05-02 ENCOUNTER — Ambulatory Visit
Admission: RE | Admit: 2015-05-02 | Discharge: 2015-05-02 | Disposition: A | Discharge status: Home / Self Care | Payer: BLUE CROSS/BLUE SHIELD | Source: Ambulatory Visit | Attending: Neurosurgery | Admitting: Neurosurgery

## 2015-05-02 ENCOUNTER — Other Ambulatory Visit: Payer: Self-pay | Admitting: Neurosurgery

## 2015-05-02 DIAGNOSIS — M545 Low back pain, unspecified: Secondary | ICD-10-CM

## 2015-05-02 DIAGNOSIS — G8929 Other chronic pain: Secondary | ICD-10-CM

## 2015-05-02 DIAGNOSIS — Z9889 Other specified postprocedural states: Secondary | ICD-10-CM | POA: Insufficient documentation

## 2015-06-17 ENCOUNTER — Emergency Department
Admission: EM | Admit: 2015-06-17 | Discharge: 2015-06-17 | Disposition: A | Discharge status: Home / Self Care | Payer: BLUE CROSS/BLUE SHIELD | Attending: Emergency Medicine | Admitting: Emergency Medicine

## 2015-06-17 ENCOUNTER — Encounter: Payer: Self-pay | Admitting: Emergency Medicine

## 2015-06-17 DIAGNOSIS — F17210 Nicotine dependence, cigarettes, uncomplicated: Secondary | ICD-10-CM | POA: Diagnosis not present

## 2015-06-17 DIAGNOSIS — R112 Nausea with vomiting, unspecified: Secondary | ICD-10-CM | POA: Diagnosis present

## 2015-06-17 DIAGNOSIS — Z79899 Other long term (current) drug therapy: Secondary | ICD-10-CM | POA: Insufficient documentation

## 2015-06-17 DIAGNOSIS — A084 Viral intestinal infection, unspecified: Secondary | ICD-10-CM

## 2015-06-17 HISTORY — DX: Other chronic pain: G89.29

## 2015-06-17 HISTORY — DX: Dorsalgia, unspecified: M54.9

## 2015-06-17 LAB — COMPREHENSIVE METABOLIC PANEL
ALBUMIN: 4.3 g/dL (ref 3.5–5.0)
ALK PHOS: 66 U/L (ref 38–126)
ALT: 21 U/L (ref 17–63)
ANION GAP: 3 — AB (ref 5–15)
AST: 22 U/L (ref 15–41)
BUN: 14 mg/dL (ref 6–20)
CHLORIDE: 107 mmol/L (ref 101–111)
CO2: 29 mmol/L (ref 22–32)
Calcium: 9.3 mg/dL (ref 8.9–10.3)
Creatinine, Ser: 0.72 mg/dL (ref 0.61–1.24)
GFR calc Af Amer: >60 mL/min (ref >60)
GFR calc non Af Amer: >60 mL/min (ref >60)
GLUCOSE: 97 mg/dL (ref 65–99)
POTASSIUM: 4.2 mmol/L (ref 3.5–5.1)
SODIUM: 139 mmol/L (ref 135–145)
Total Bilirubin: 0.6 mg/dL (ref 0.3–1.2)
Total Protein: 7.8 g/dL (ref 6.5–8.1)

## 2015-06-17 LAB — CBC
HEMATOCRIT: 46 % (ref 40.0–52.0)
HEMOGLOBIN: 15.2 g/dL (ref 13.0–18.0)
MCH: 28.8 pg (ref 26.0–34.0)
MCHC: 33.1 g/dL (ref 32.0–36.0)
MCV: 87.1 fL (ref 80.0–100.0)
Platelets: 193 10*3/uL (ref 150–440)
RBC: 5.28 MIL/uL (ref 4.40–5.90)
RDW: 14.9 % — ABNORMAL HIGH (ref 11.5–14.5)
WBC: 9 10*3/uL (ref 3.8–10.6)

## 2015-06-17 LAB — LIPASE, BLOOD: Lipase: 30 U/L (ref 11–51)

## 2015-06-17 MED ORDER — ONDANSETRON HCL 4 MG/2ML IJ SOLN
INTRAMUSCULAR | Status: AC
  Filled 2015-06-17: qty 2

## 2015-06-17 MED ORDER — ONDANSETRON HCL 4 MG/2ML IJ SOLN
4.0000 mg | Freq: Once | INTRAMUSCULAR | Status: AC
  Administered 2015-06-17: 4 mg via INTRAVENOUS

## 2015-06-17 MED ORDER — ONDANSETRON HCL 4 MG PO TABS: 4.0000 mg | ORAL_TABLET | Freq: Every day | ORAL | Status: DC | PRN

## 2015-06-17 MED ORDER — SODIUM CHLORIDE 0.9 % IV SOLN
1000.0000 mL | Freq: Once | INTRAVENOUS | Status: AC
  Administered 2015-06-17: 1000 mL via INTRAVENOUS

## 2015-06-17 NOTE — Discharge Instructions (Signed)

## 2015-06-17 NOTE — ED Notes (Signed)
Pt to ed with c/o vomiting x 15 in the last 24 hours, also diarrhea x 10 in the last 24 hours.

## 2015-06-17 NOTE — ED Provider Notes (Signed)
[facility] Emergency Department Provider Note  ____________________________________________   I have reviewed the triage vital signs and the nursing notes.   HISTORY  Chief Complaint Nausea; Emesis; and Diarrhea    HPI Wesley Adams is a 50 y.o. male who presents with complaints of nausea vomiting and diarrhea started yesterday evening. He reports he is not able tolerate by mouth's. He reports watery diarrhea. Nonbilious nonbloody. He denies sick contacts. Denies fevers or chills. Denies abdominal pain     Past Medical History  Diagnosis Date  . Medical history non-contributory   . Chronic back pain     Patient Active Problem List   Diagnosis Date Noted  . Lumbar degenerative disc disease 01/30/2015    Past Surgical History  Procedure Laterality Date  . Back surgery  Feb 2016    cut disc off of nerve with Dr Arnoldo Morale  . Fracture surgery Right as child    rod in right femur    Current Outpatient Rx  Name  Route  Sig  Dispense  Refill  . cyclobenzaprine (FLEXERIL) 10 MG tablet   Oral   Take 1 tablet (10 mg total) by mouth 3 (three) times daily as needed for muscle spasms.   50 tablet   1   . docusate sodium (COLACE) 100 MG capsule   Oral   Take 1 capsule (100 mg total) by mouth 2 (two) times daily.   60 capsule   0   . oxyCODONE-acetaminophen (PERCOCET) 10-325 MG tablet   Oral   Take 1 tablet by mouth every 4 (four) hours as needed for pain.   100 tablet   0     Allergies Review of patient's allergies indicates no known allergies.  History reviewed. No pertinent family history.  Social History Social History  Substance Use Topics  . Smoking status: Current Every Day Smoker -- 1.00 packs/day for 25 years    Types: Cigarettes  . Smokeless tobacco: None  . Alcohol Use: No     Comment: beer on weekend    Review of Systems  Constitutional: Negative for fever. Negative for chills Eyes: Negative for visual  changes. ENT: Negative for sore throat Cardiovascular: Negative for chest pain. Respiratory: Negative for shortness of breath. Gastrointestinal: As above Genitourinary: Negative for dysuria. Musculoskeletal: Negative for back pain. Skin: Negative for rash. Neurological: Negative for headaches Psychiatric: No anxiety    ____________________________________________   PHYSICAL EXAM:  VITAL SIGNS: ED Triage Vitals  Enc Vitals Group     BP 06/17/15 1117 130/83 mmHg     Pulse Rate 06/17/15 1117 87     Resp 06/17/15 1117 20     Temp 06/17/15 1117 97.9 F (36.6 C)     Temp Source 06/17/15 1117 Oral     SpO2 06/17/15 1117 100 %     Weight 06/17/15 1117 205 lb (92.987 kg)     Height 06/17/15 1117 6\' 1"  (1.854 m)     Head Cir --      Peak Flow --      Pain Score 06/17/15 1117 7     Pain Loc --      Pain Edu? --      Excl. in [place]? --      Constitutional: Alert and oriented. No acute distress Eyes: Conjunctivae are normal.  ENT   Head: Normocephalic and atraumatic.   Mouth/Throat: Mucous membranes are moist. Cardiovascular: Normal rate, regular rhythm. Normal and symmetric distal pulses are present in all extremities.  Respiratory: Normal respiratory effort without tachypnea nor retractions.  Gastrointestinal: Soft and non-tender in all quadrants. No distention. There is no CVA tenderness. Genitourinary: deferred Musculoskeletal: Nontender with normal range of motion in all extremities. No lower extremity tenderness nor edema. Neurologic:  Normal speech and language.  Skin:  Skin is warm, dry and intact. No rash noted. Psychiatric: Mood and affect are normal. Patient exhibits appropriate insight and judgment.  ____________________________________________    LABS (pertinent positives/negatives)  Labs Reviewed  COMPREHENSIVE METABOLIC PANEL - Abnormal; Notable for the following:    Anion gap 3 (*)    All other components within normal limits  CBC - Abnormal;  Notable for the following:    RDW 14.9 (*)    All other components within normal limits  LIPASE, BLOOD    ____________________________________________   EKG  None  ____________________________________________    RADIOLOGY I have personally reviewed any xrays that were ordered on this patient: None  ____________________________________________   PROCEDURES  Procedure(s) performed: none  Critical Care performed:none  ____________________________________________   INITIAL IMPRESSION / ASSESSMENT AND PLAN / ED COURSE  Pertinent labs & imaging results that were available during my care of the patient were reviewed by me and considered in my medical decision making (see chart for details).  Patient presents with nausea vomiting and diarrhea. He has no abdominal tenderness to palpation. Symptoms are consistent with viral gastroenteritis which is rampant in the community at this time. We will treat with IV fluids, Zofran and reevaluate  Patient feeling significant better after fluids and Zofran. I'll discharge him home with outpatient perception for Zofran and PCP follow-up as needed  ____________________________________________   FINAL CLINICAL IMPRESSION(S) / ED DIAGNOSES  Final diagnoses:  Viral gastroenteritis     Lavonia Drafts, MD 06/17/15 1513

## 2015-06-30 ENCOUNTER — Other Ambulatory Visit: Payer: Self-pay | Admitting: Neurosurgery

## 2015-06-30 DIAGNOSIS — M545 Low back pain: Principal | ICD-10-CM

## 2015-06-30 DIAGNOSIS — G8929 Other chronic pain: Secondary | ICD-10-CM

## 2015-07-18 ENCOUNTER — Ambulatory Visit
Admission: RE | Admit: 2015-07-18 | Discharge: 2015-07-18 | Disposition: A | Discharge status: Home / Self Care | Payer: BLUE CROSS/BLUE SHIELD | Source: Ambulatory Visit | Attending: Neurosurgery | Admitting: Neurosurgery

## 2015-07-18 DIAGNOSIS — M2578 Osteophyte, vertebrae: Secondary | ICD-10-CM | POA: Diagnosis not present

## 2015-07-18 DIAGNOSIS — M545 Low back pain, unspecified: Secondary | ICD-10-CM

## 2015-07-18 DIAGNOSIS — M4806 Spinal stenosis, lumbar region: Secondary | ICD-10-CM | POA: Insufficient documentation

## 2015-07-18 DIAGNOSIS — M5186 Other intervertebral disc disorders, lumbar region: Secondary | ICD-10-CM | POA: Insufficient documentation

## 2015-07-18 DIAGNOSIS — G8929 Other chronic pain: Secondary | ICD-10-CM

## 2015-07-18 MED ORDER — GADOBENATE DIMEGLUMINE 529 MG/ML IV SOLN
20.0000 mL | Freq: Once | INTRAVENOUS | Status: AC | PRN
  Administered 2015-07-18: 19 mL via INTRAVENOUS

## 2015-07-31 ENCOUNTER — Ambulatory Visit (INDEPENDENT_AMBULATORY_CARE_PROVIDER_SITE_OTHER): Payer: BLUE CROSS/BLUE SHIELD | Admitting: Family Medicine

## 2015-07-31 ENCOUNTER — Encounter: Payer: Self-pay | Admitting: Family Medicine

## 2015-07-31 VITALS — BP 136/82 | HR 85 | Temp 98.9°F | Resp 16 | Ht 73.0 in | Wt 204.2 lb

## 2015-07-31 DIAGNOSIS — J069 Acute upper respiratory infection, unspecified: Secondary | ICD-10-CM | POA: Diagnosis not present

## 2015-07-31 DIAGNOSIS — J0100 Acute maxillary sinusitis, unspecified: Secondary | ICD-10-CM

## 2015-07-31 DIAGNOSIS — H9202 Otalgia, left ear: Secondary | ICD-10-CM | POA: Diagnosis not present

## 2015-07-31 MED ORDER — HYDROCOD POLST-CPM POLST ER 10-8 MG/5ML PO SUER: 5.0000 mL | Freq: Two times a day (BID) | ORAL | Status: DC | PRN

## 2015-07-31 MED ORDER — AMOXICILLIN-POT CLAVULANATE 875-125 MG PO TABS: 1.0000 | ORAL_TABLET | Freq: Two times a day (BID) | ORAL | Status: DC

## 2015-07-31 NOTE — Progress Notes (Signed)
Name: Wesley Adams   MRN: [id number]    DOB: 14-May-1965   Date:07/31/2015       Progress Note  Subjective  Chief Complaint  Chief Complaint  Patient presents with  . Sore Throat    hard to swallow  . Nasal Congestion    whitish mucus  . Ear Pain    left ear pain & pressure  . Cough    productive cough thick and greenish. otc Theraflu, NyQuil and Mucinex  . Sinusitis    left sided facial pain and pressure  . Fatigue    patient stated that he has had some bodyaches and hot flashes.  . Chills    patient stated that his sx started on Sunday night    HPI  URI/Sinusitis: he states he got sick 5 days ago, symptoms are getting progressively worse with left sinus pressure and pain, left otalgia, a productive cough, no wheezing or SOB, but he is feeling very tired and has subjective fever/chills. Mild dysphagia and is improving. He is a smoker  Patient Active Problem List   Diagnosis Date Noted  . Lumbar degenerative disc disease 01/30/2015    Past Surgical History  Procedure Laterality Date  . Back surgery  Feb 2016    cut disc off of nerve with Dr Arnoldo Morale  . Fracture surgery Right as child    rod in right femur    History reviewed. No pertinent family history.  Social History   Social History  . Marital Status: Legally Separated    Spouse Name: N/A  . Number of Children: N/A  . Years of Education: N/A   Occupational History  . Not on file.   Social History Main Topics  . Smoking status: Current Every Day Smoker -- 1.00 packs/day for 25 years    Types: Cigarettes  . Smokeless tobacco: Not on file  . Alcohol Use: No     Comment: beer on weekend  . Drug Use: No  . Sexual Activity: Not on file   Other Topics Concern  . Not on file   Social History Narrative     Current outpatient prescriptions:  .  gabapentin (NEURONTIN) 300 MG capsule, TAKE 1 CAPSULE BY ORAL ROUTE 3 TIMES EVERY DAY, Disp: , Rfl: 1 .  HYDROcodone-acetaminophen (NORCO) 10-325 MG tablet,  TAKE 1/2-1 TABLET BY ORAL ROUTE EVERY 6 HOURS AS NEEDED FOR PAIN, Disp: , Rfl: 0 .  amoxicillin-clavulanate (AUGMENTIN) 875-125 MG tablet, Take 1 tablet by mouth 2 (two) times daily., Disp: 20 tablet, Rfl: 0 .  chlorpheniramine-HYDROcodone (TUSSIONEX PENNKINETIC ER) 10-8 MG/5ML SUER, Take 5 mLs by mouth every 12 (twelve) hours as needed., Disp: 140 mL, Rfl: 0 .  cyclobenzaprine (FLEXERIL) 10 MG tablet, Take 1 tablet (10 mg total) by mouth 3 (three) times daily as needed for muscle spasms. (Patient not taking: Reported on 07/31/2015), Disp: 50 tablet, Rfl: 1 .  docusate sodium (COLACE) 100 MG capsule, Take 1 capsule (100 mg total) by mouth 2 (two) times daily. (Patient not taking: Reported on 07/31/2015), Disp: 60 capsule, Rfl: 0 .  ondansetron (ZOFRAN) 4 MG tablet, Take 1 tablet (4 mg total) by mouth daily as needed for nausea or vomiting. (Patient not taking: Reported on 07/31/2015), Disp: 20 tablet, Rfl: 1  No Known Allergies   ROS  Ten systems reviewed and is negative except as mentioned in HPI  Still having back pain and seeing neurosugeon  Objective  Filed Vitals:   07/31/15 1547  BP: 136/82  Pulse:  85  Temp: 98.9 F (37.2 C)  TempSrc: Oral  Resp: 16  Height: 6' 1"  (1.854 m)  Weight: 204 lb 3.2 oz (92.625 kg)  SpO2: 97%    Body mass index is 26.95 kg/(m^2).  Physical Exam  Constitutional: Patient appears well-developed and well-nourished.  No distress.  HEENT: head atraumatic, normocephalic, pupils equal and reactive to light, ears normal TM bilaretally, neck supple, throat within normal limits, tender during percussion of left maxillary sinus Cardiovascular: Normal rate, regular rhythm and normal heart sounds.  No murmur heard. No BLE edema. Pulmonary/Chest: Effort normal and breath sounds normal. No respiratory distress. Abdominal: Soft.  There is no tenderness. Psychiatric: Patient has a normal mood and affect. behavior is normal. Judgment and thought content  normal.  Recent Results (from the past 2160 hour(s))  Lipase, blood     Status: None   Collection Time: 06/17/15 11:24 AM  Result Value Ref Range   Lipase 30 11 - 51 U/L  Comprehensive metabolic panel     Status: Abnormal   Collection Time: 06/17/15 11:24 AM  Result Value Ref Range   Sodium 139 135 - 145 mmol/L   Potassium 4.2 3.5 - 5.1 mmol/L   Chloride 107 101 - 111 mmol/L   CO2 29 22 - 32 mmol/L   Glucose, Bld 97 65 - 99 mg/dL   BUN 14 6 - 20 mg/dL   Creatinine, Ser 0.72 0.61 - 1.24 mg/dL   Calcium 9.3 8.9 - 10.3 mg/dL   Total Protein 7.8 6.5 - 8.1 g/dL   Albumin 4.3 3.5 - 5.0 g/dL   AST 22 15 - 41 U/L   ALT 21 17 - 63 U/L   Alkaline Phosphatase 66 38 - 126 U/L   Total Bilirubin 0.6 0.3 - 1.2 mg/dL   GFR calc non Af Amer >60 >60 mL/min   GFR calc Af Amer >60 >60 mL/min    Comment: (NOTE) The eGFR has been calculated using the CKD EPI equation. This calculation has not been validated in all clinical situations. eGFR's persistently <60 mL/min signify possible Chronic Kidney Disease.    Anion gap 3 (L) 5 - 15  CBC     Status: Abnormal   Collection Time: 06/17/15 11:24 AM  Result Value Ref Range   WBC 9.0 3.8 - 10.6 K/uL   RBC 5.28 4.40 - 5.90 MIL/uL   Hemoglobin 15.2 13.0 - 18.0 g/dL   HCT 46.0 40.0 - 52.0 %   MCV 87.1 80.0 - 100.0 fL   MCH 28.8 26.0 - 34.0 pg   MCHC 33.1 32.0 - 36.0 g/dL   RDW 14.9 (H) 11.5 - 14.5 %   Platelets 193 150 - 440 K/uL      PHQ2/9: Depression screen PHQ 2/9 07/31/2015  Decreased Interest 0  Down, Depressed, Hopeless 0  PHQ - 2 Score 0     Fall Risk: Fall Risk  07/31/2015  Falls in the past year? No     Functional Status Survey: Is the patient deaf or have difficulty hearing?: No Does the patient have difficulty seeing, even when wearing glasses/contacts?: No Does the patient have difficulty concentrating, remembering, or making decisions?: No Does the patient have difficulty walking or climbing stairs?: No Does the patient  have difficulty dressing or bathing?: No Does the patient have difficulty doing errands alone such as visiting a doctor's office or shopping?: No    Assessment & Plan  1. Upper respiratory infection  He would like something for cough so he  can sleep better at night - chlorpheniramine-HYDROcodone (TUSSIONEX PENNKINETIC ER) 10-8 MG/5ML SUER; Take 5 mLs by mouth every 12 (twelve) hours as needed.  Dispense: 140 mL; Refill: 0  2. Otalgia, left  Advised saline spray, normal exam  3. Acute maxillary sinusitis, recurrence not specified  Explained it may be viral, but very tender on left maxillary sinus - amoxicillin-clavulanate (AUGMENTIN) 875-125 MG tablet; Take 1 tablet by mouth 2 (two) times daily.  Dispense: 20 tablet; Refill: 0

## 2015-11-04 ENCOUNTER — Ambulatory Visit
Admission: RE | Admit: 2015-11-04 | Discharge: 2015-11-04 | Disposition: A | Discharge status: Home / Self Care | Payer: BLUE CROSS/BLUE SHIELD | Source: Ambulatory Visit | Attending: Family Medicine | Admitting: Family Medicine

## 2015-11-04 ENCOUNTER — Encounter: Payer: Self-pay | Admitting: Family Medicine

## 2015-11-04 ENCOUNTER — Ambulatory Visit (INDEPENDENT_AMBULATORY_CARE_PROVIDER_SITE_OTHER): Payer: BLUE CROSS/BLUE SHIELD | Admitting: Family Medicine

## 2015-11-04 ENCOUNTER — Encounter (INDEPENDENT_AMBULATORY_CARE_PROVIDER_SITE_OTHER): Payer: Self-pay

## 2015-11-04 VITALS — BP 132/68 | HR 62 | Temp 98.4°F | Resp 15 | Ht 73.0 in | Wt 186.7 lb

## 2015-11-04 DIAGNOSIS — M545 Low back pain, unspecified: Secondary | ICD-10-CM

## 2015-11-04 DIAGNOSIS — M419 Scoliosis, unspecified: Secondary | ICD-10-CM | POA: Insufficient documentation

## 2015-11-04 DIAGNOSIS — Z981 Arthrodesis status: Secondary | ICD-10-CM | POA: Diagnosis not present

## 2015-11-04 MED ORDER — TIZANIDINE HCL 4 MG PO CAPS: 4.0000 mg | ORAL_CAPSULE | Freq: Three times a day (TID) | ORAL | Status: DC | PRN

## 2015-11-04 NOTE — Progress Notes (Signed)
Name: Wesley Adams   MRN: [id number]    DOB: 12/21/1965   Date:11/04/2015       Progress Note  Subjective  Chief Complaint  Chief Complaint  Patient presents with  . 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 Since then, has worsening lower back pain. He has history of spinal stenosis and disc bulging and normally sees Dr. Arnoldo Morale in [place] and had two lumbar surgeries in the last year (February and October 2016). Pain is worse with laying down, improves with walking to some extent. Pain is rated at 9/10. He tried to contact Dr. Arnoldo Morale who is out of the office (has an appointment scheduled for August). In addition, he reports feeling his 'balance is off' in his right leg and at times it feels his right leg is 'weak'. When he stands up, he feels numbness in his right leg.  Past Medical History  Diagnosis Date  . Medical history non-contributory   . Chronic back pain     Past Surgical History  Procedure Laterality Date  . Back surgery  Feb 2016    cut disc off of nerve with Dr Arnoldo Morale  . Fracture surgery Right as child    rod in right femur    History reviewed. No pertinent family history.  Social History   Social History  . Marital Status: Legally Separated    Spouse Name: N/A  . Number of Children: N/A  . Years of Education: N/A   Occupational History  . Not on file.   Social History Main Topics  . Smoking status: Current Every Day Smoker -- 1.00 packs/day for 25 years    Types: Cigarettes  . Smokeless tobacco: Not on file  . Alcohol Use: No     Comment: beer on weekend  . Drug Use: No  . Sexual Activity: Not on file   Other Topics Concern  . Not on file   Social History Narrative     Current outpatient prescriptions:  .  gabapentin (NEURONTIN) 300 MG capsule, TAKE 1 CAPSULE BY ORAL ROUTE 3 TIMES  EVERY DAY, Disp: , Rfl: 1 .  HYDROcodone-acetaminophen (NORCO) 10-325 MG tablet, TAKE 1/2-1 TABLET BY ORAL ROUTE EVERY 6 HOURS AS NEEDED FOR PAIN, Disp: , Rfl: 0 .  ondansetron (ZOFRAN) 4 MG tablet, Take 1 tablet (4 mg total) by mouth daily as needed for nausea or vomiting., Disp: 20 tablet, Rfl: 1  No Known Allergies   Review of Systems  Musculoskeletal: Positive for back pain.  Neurological: Negative for tingling and focal weakness.    Objective  Filed Vitals:   11/04/15 1055  BP: 132/68  Pulse: 62  Temp: 98.4 F (36.9 C)  TempSrc: Oral  Resp: 15  Height: 6\' 1"  (1.854 m)  Weight: 186 lb 11.2 oz (84.687 kg)  SpO2: 98%    Physical Exam  Constitutional: He is oriented to person, place, and time and well-developed, well-nourished, and in no distress.  Musculoskeletal:       Lumbar back: He exhibits tenderness, pain and spasm.       Back:  SLR test positive on the right side. Strength in right leg decreased due to pain, limited ROM in right leg. Plantar reflexes 2+   Neurological: He is alert and oriented to person, place, and time.  Psychiatric: Mood, memory,  affect and judgment normal.  Nursing note and vitals reviewed.    Assessment & Plan  1. Acute bilateral low back pain without sciatica Patient normally follows with spine specialist and is on chronic opioid therapy. Obtain x-ray of lumbar spine for evaluation of worsening low back pain since last week. Started on muscle relaxant therapy or paraspinal muscle spasm. - DG Lumbar Spine Complete; Future - tiZANidine (ZANAFLEX) 4 MG capsule; Take 1 capsule (4 mg total) by mouth 3 (three) times daily as needed for muscle spasms.  Dispense: 30 capsule; Refill: 0   Rinnah Peppel [facility] 11/04/2015 11:14 AM

## 2015-11-05 ENCOUNTER — Telehealth: Payer: Self-pay | Admitting: Emergency Medicine

## 2015-11-05 ENCOUNTER — Ambulatory Visit: Payer: BLUE CROSS/BLUE SHIELD | Admitting: Family Medicine

## 2015-11-05 NOTE — Telephone Encounter (Signed)
Was seen on Tuesday. Can he get a letter for work for 2 more days. The Neurosurgeon finally called back. MRI is scheduled waiting on approval from Insurance.

## 2015-11-05 NOTE — Telephone Encounter (Signed)
I don't know this patient.

## 2015-11-05 NOTE — Telephone Encounter (Signed)
Patient notified of x-ray results.

## 2015-11-06 ENCOUNTER — Encounter: Payer: Self-pay | Admitting: Emergency Medicine

## 2015-11-06 ENCOUNTER — Ambulatory Visit
Admission: EM | Admit: 2015-11-06 | Discharge: 2015-11-06 | Disposition: A | Discharge status: Home / Self Care | Payer: BLUE CROSS/BLUE SHIELD | Attending: Emergency Medicine | Admitting: Emergency Medicine

## 2015-11-06 DIAGNOSIS — M545 Low back pain, unspecified: Secondary | ICD-10-CM

## 2015-11-06 DIAGNOSIS — G8929 Other chronic pain: Secondary | ICD-10-CM | POA: Diagnosis not present

## 2015-11-06 MED ORDER — DICLOFENAC SODIUM 75 MG PO TBEC: 75.0000 mg | DELAYED_RELEASE_TABLET | Freq: Two times a day (BID) | ORAL | Status: DC

## 2015-11-06 MED ORDER — PREDNISONE 10 MG (21) PO TBPK: ORAL_TABLET | ORAL | Status: DC

## 2015-11-06 MED ORDER — KETOROLAC TROMETHAMINE 60 MG/2ML IM SOLN
60.0000 mg | Freq: Once | INTRAMUSCULAR | Status: AC
  Administered 2015-11-06: 60 mg via INTRAMUSCULAR

## 2015-11-06 NOTE — Telephone Encounter (Signed)
Patient notified to go to UC in [facility] or ER

## 2015-11-06 NOTE — ED Provider Notes (Signed)
HPI  SUBJECTIVE:  Wesley Adams is a 50 y.o. male who presents with an acute flare of his chronic back pain. States that he was mowing his yard 8 days ago, and felt a "pop" in his back. Reports burning constant pain, states that it feels like he has a "knot" in his back. Symptoms are worse with bending forward, turning, sitting still,  better with moving around. He has tried Zanaflex, hydrocodone and Percocet. He has not tried any NSAIDs. He has not been on steroids recently. He states that he has weakness in his right leg secondary to pain. He states that his balance feels "off". He saw an M.D. in his primary care practice on 7/11, had x-rays which were negative for any acute changes, and was prescribed Zanaflex.  Pt denies N/V, fevers, flank pain, abdominal pain, urinary urgency, frequency, dysuria, cloudy or odorous urine, hematuria.   No saddle anesthesia, distal numbness, bilateral radicular leg pain/weakness, fevers, bladder/ bowel incontinence, urinary retention, h/o CA / multiple myleoma. Reports 20 pound unintentional weight loss over the past 2 months, but states that his been under a significant amount of stress recently. No pain worse at night, h/o IVDU, h/o HIV. No history of diabetes, hypertension, kidney disease.  States feels identical to previous episodes of back pain.  Patient is on chronic opiate therapy for his back pain that is prescribed to him by his neurosurgeon, Dr. Arnoldo Morale.   Past Medical History  Diagnosis Date  . Medical history non-contributory   . Chronic back pain     Past Surgical History  Procedure Laterality Date  . Back surgery  Feb 2016    cut disc off of nerve with Dr Arnoldo Morale  . Fracture surgery Right as child    rod in right femur    History reviewed. No pertinent family history.  Social History  Substance Use Topics  . Smoking status: Current Every Day Smoker -- 1.00 packs/day for 25 years    Types: Cigarettes  . Smokeless tobacco: None  .  Alcohol Use: No     Comment: beer on weekend    No current facility-administered medications for this encounter.  Current outpatient prescriptions:  .  diclofenac (VOLTAREN) 75 MG EC tablet, Take 1 tablet (75 mg total) by mouth 2 (two) times daily. Take with food, Disp: 30 tablet, Rfl: 0 .  gabapentin (NEURONTIN) 300 MG capsule, TAKE 1 CAPSULE BY ORAL ROUTE 3 TIMES EVERY DAY, Disp: , Rfl: 1 .  HYDROcodone-acetaminophen (NORCO) 10-325 MG tablet, TAKE 1/2-1 TABLET BY ORAL ROUTE EVERY 6 HOURS AS NEEDED FOR PAIN, Disp: , Rfl: 0 .  ondansetron (ZOFRAN) 4 MG tablet, Take 1 tablet (4 mg total) by mouth daily as needed for nausea or vomiting., Disp: 20 tablet, Rfl: 1 .  predniSONE (STERAPRED UNI-PAK 21 TAB) 10 MG (21) TBPK tablet, Dispense one 6 day pack. Take as directed with food., Disp: 21 tablet, Rfl: 0 .  tiZANidine (ZANAFLEX) 4 MG capsule, Take 1 capsule (4 mg total) by mouth 3 (three) times daily as needed for muscle spasms., Disp: 30 capsule, Rfl: 0  No Known Allergies   ROS  As noted in HPI.   Physical Exam  BP 127/74 mmHg  Pulse 58  Temp(Src) 98.1 F (36.7 C) (Tympanic)  Resp 16  Ht 6\' 1"  (1.854 m)  Wt 186 lb (84.369 kg)  BMI 24.55 kg/m2  SpO2 100%  Constitutional: Well developed, well nourished, no acute distress Eyes:  EOMI, conjunctiva normal bilaterally HENT: Normocephalic,  atraumatic,mucus membranes moist Respiratory: Normal inspiratory effort Cardiovascular: Normal rate GI: nondistended. No suprapubic tenderness skin: No rash, skin intact Musculoskeletal: no CVAT. +  paralumbar tenderness, tenderness over bilateral lower back, + muscle spasm. No bony tenderness. Bilateral lower extremities nontender, baseline ROM with intact  PT pulses. No pain with passive  int/ext rotation flex/extension hips bilaterally. SLR positive bilaterally. Sensation baseline light touch bilaterally for Pt, DTR's symmetric and intact bilaterally KJ, pain aggravated with all active range of  motion at the hips and knees. Motor symmetric bilateral 5/5 hip flexion, quadriceps, hamstrings, foot dorsiflexion, foot plantarflexion, gait somewhat antalgic but without apparent new ataxia.  + 4/5 strength EHL compared to L side.  Neurologic: Alert & oriented x 3, no focal neuro deficits Psychiatric: Speech and behavior appropriate   ED Course   Medications  ketorolac (TORADOL) injection 60 mg (60 mg Intramuscular Given 11/06/15 1425)    No orders of the defined types were placed in this encounter.    No results found for this or any previous visit (from the past 24 hour(s)). No results found. Dg Lumbar Spine Complete  11/04/2015  CLINICAL DATA:  Low back injury bending over 6 days ago with onset of low back pain and right leg weakness. History of prior lumbar spine surgery. EXAM: LUMBAR SPINE - COMPLETE 4+ VIEW COMPARISON:  MRI lumbar spine 07/18/2015. Plain films lumbar spine 05/02/2015. FINDINGS: Vertebral body height and alignment are maintained. Convex right scoliosis is unchanged. The patient is status post L4-5 fusion. Hardware is intact. Aortic atherosclerosis is noted. IMPRESSION: No acute abnormality or change compared to the prior examination. Status post L4-5 fusion. Convex right scoliosis. Electronically Signed   By: Inge Rise M.D.   On: 11/04/2015 12:52   ED Clinical Impression  Acute exacerbation of chronic low back pain   ED Assessment/Plan  [place] narcotic database reviewed. Patient has been getting his narcotics from 2 providers for the past 6 months. Patient was prescribed a 30 day supply of Percocet 10/325 #60 on 6/28, which he filled on 7/2. Also filled 25 day supply of Norco 10/325 #100 on 6/15, and a 30 day supply of Percocet 10/325 #60 on 6/5.   Reviewed x-ray report from 7/11. No acute changes. Given that he has not had any further trauma,we'll not repeat films today.  Patient reports weakness, but states that this is due to pain rather than true weakness.  He does have some weakness in the L5 distribution on the right side on my exam, but again, this is due to pain per patient. He is ambulatory without ataxia. No evidence of acute spinal cord involvement at this time.  Called Dr. Arnoldo Morale' office. Patient has a myelogram ordered for next week. Once that this is done, patient is to call Dr. Arnoldo Morale' secretary and he will be worked in prior to his scheduled appointment on August 25.  Giving shot of Toradol. Plan to send him home with steroids, regular NSAIDs in addition to his Zanaflex and chronic opiate therapy.  Reevaluation, patient states that the Toradol helped.   Discussed medical decision-making, and plan for follow-up with the patient.  Discussed signs and symptoms that should prompt return to the emergency department.  Patient agrees with plan.   *This clinic note was created using Dragon dictation software. Therefore, there may be occasional mistakes despite careful proofreading.  ?    Melynda Ripple, MD 11/06/15 1451

## 2015-11-06 NOTE — ED Notes (Signed)
Patient c/o lower back pain that started last Wed.  Patient states that he was mowing the lawn when he felt a pop in his back.  Patient has scheduled a follow-up MRI in couple of weeks. Patient currently taking a muscle relaxer.

## 2015-11-14 ENCOUNTER — Other Ambulatory Visit: Payer: Self-pay | Admitting: Neurosurgery

## 2015-11-14 DIAGNOSIS — M545 Low back pain: Principal | ICD-10-CM

## 2015-11-14 DIAGNOSIS — G8929 Other chronic pain: Secondary | ICD-10-CM

## 2015-11-21 ENCOUNTER — Ambulatory Visit
Admission: RE | Admit: 2015-11-21 | Discharge: 2015-11-21 | Disposition: A | Discharge status: Home / Self Care | Payer: BLUE CROSS/BLUE SHIELD | Source: Ambulatory Visit | Attending: Neurosurgery | Admitting: Neurosurgery

## 2015-11-21 VITALS — BP 126/61 | HR 57

## 2015-11-21 DIAGNOSIS — M545 Low back pain: Principal | ICD-10-CM

## 2015-11-21 DIAGNOSIS — M5136 Other intervertebral disc degeneration, lumbar region: Secondary | ICD-10-CM

## 2015-11-21 DIAGNOSIS — G8929 Other chronic pain: Secondary | ICD-10-CM

## 2015-11-21 MED ORDER — IOPAMIDOL (ISOVUE-M 200) INJECTION 41%
15.0000 mL | Freq: Once | INTRAMUSCULAR | Status: AC
  Administered 2015-11-21: 15 mL via INTRATHECAL

## 2015-11-21 MED ORDER — MEPERIDINE HCL 100 MG/ML IJ SOLN
75.0000 mg | Freq: Once | INTRAMUSCULAR | Status: AC
  Administered 2015-11-21: 75 mg via INTRAMUSCULAR

## 2015-11-21 MED ORDER — DIAZEPAM 5 MG PO TABS
10.0000 mg | ORAL_TABLET | Freq: Once | ORAL | Status: AC
  Administered 2015-11-21: 10 mg via ORAL

## 2015-11-21 MED ORDER — ONDANSETRON HCL 4 MG/2ML IJ SOLN
4.0000 mg | Freq: Once | INTRAMUSCULAR | Status: AC
  Administered 2015-11-21: 4 mg via INTRAMUSCULAR

## 2015-11-21 NOTE — Discharge Instructions (Signed)

## 2016-01-07 ENCOUNTER — Ambulatory Visit (INDEPENDENT_AMBULATORY_CARE_PROVIDER_SITE_OTHER): Payer: Managed Care, Other (non HMO) | Admitting: Family Medicine

## 2016-01-07 ENCOUNTER — Other Ambulatory Visit: Payer: Self-pay | Admitting: Family Medicine

## 2016-01-07 ENCOUNTER — Encounter: Payer: Self-pay | Admitting: Family Medicine

## 2016-01-07 VITALS — BP 127/73 | HR 80 | Temp 98.5°F | Resp 17 | Ht 73.0 in | Wt 186.6 lb

## 2016-01-07 DIAGNOSIS — Z202 Contact with and (suspected) exposure to infections with a predominantly sexual mode of transmission: Secondary | ICD-10-CM | POA: Diagnosis not present

## 2016-01-07 NOTE — Patient Instructions (Signed)
Chlamydia, Male Chlamydia is an infection. It is spread through sexual contact. Chlamydia can be in different areas of the body. These areas include the urethra, throat, or rectum. It is important to treat chlamydia as soon as possible. It can damage other organs.  CAUSES  Chlamydia is caused by bacteria. It is a sexually transmitted disease. This means that it is passed from an infected partner during intimate contact. This contact could be with the genitals, mouth, or rectal area.  SIGNS AND SYMPTOMS  There may not be any symptoms. This is often the case early in the infection. If there are symptoms, they are usually mild and may only be noticeable in the morning. Symptoms you may notice include:   Burning with urination.  Pain or swelling in the testicles.  Watery mucus-like discharge from the penis.  Long-standing (chronic) pelvic pain after frequent infections.  Pain, swelling, or itching around the anus.  A sore throat.  Itching, burning, or redness in the eyes, or discharge from the eyes. DIAGNOSIS  To diagnose this infection, your health care provider will do a pelvic exam. A sample of urine or a swab from the rectum may be taken for testing.  TREATMENT  Chlamydia is treated with antibiotic medicines. Your health care provider may test you for infection again 3 months after treatment. HOME CARE INSTRUCTIONS  Take your antibiotic medicine as directed by your health care provider. Finish the antibiotic even if you start to feel better. Incomplete treatment will put you at risk for not being able to have children (sterility).   Take medicines only as directed by your health care provider.   Rest.   Inform any sexual partners about your infection. Even if they are symptom free or have a negative culture or evaluation, they should be treated for the condition.   Do not have sex (intercourse) until treatment is completed and your health care provider says it is okay.   Keep  all follow-up visits as directed by your health care provider.   Not all test results are available during your visit. If your test results are not back during the visit, make an appointment with your health care provider to find out the results. Do not assume everything is normal if you have not heard from your health care provider or the medical facility. It is your responsibility to get your test results. SEEK MEDICAL CARE IF:  You develop new joint pain.  You have a fever. SEEK IMMEDIATE MEDICAL CARE IF:   Your pain increases.   You have abnormal discharge.   You have pain during intercourse. MAKE SURE YOU:   Understand these instructions.  Will watch your condition.  Will get help right away if you are not doing well or get worse.   This information is not intended to replace advice given to you by your health care provider. Make sure you discuss any questions you have with your health care provider.   Document Released: 04/12/2005 Document Revised: 05/03/2014 Document Reviewed: 10/19/2012 Elsevier Interactive Patient Education [employer].

## 2016-01-07 NOTE — Progress Notes (Signed)
Name: Wesley Adams   MRN: [id number]    DOB: 23-Mar-1966   Date:01/07/2016       Progress Note  Subjective  Chief Complaint  Chief Complaint  Patient presents with  . Exposure to STD    Exposure to STD  The patient's pertinent negatives include no genital itching, genital lesions, penile discharge, penile pain, scrotal swelling or testicular pain. This is a new (His male sexual partner contacted him to inform him that she was diagnosed with Chlamydia and to have himself tested) problem. The patient is experiencing no pain. Pertinent negatives include no abdominal pain, chills, dysuria, fever, painful intercourse or urgency. He is sexually active. He inconsistently uses condoms. Yes, his partner has an STD. There is no history of chlamydia.    Past Medical History:  Diagnosis Date  . Chronic back pain   . Medical history non-contributory     Past Surgical History:  Procedure Laterality Date  . BACK SURGERY  Feb 2016   cut disc off of nerve with Dr Arnoldo Morale  . FRACTURE SURGERY Right as child   rod in right femur    History reviewed. No pertinent family history.  Social History   Social History  . Marital status: Legally Separated    Spouse name: N/A  . Number of children: N/A  . Years of education: N/A   Occupational History  . Not on file.   Social History Main Topics  . Smoking status: Current Every Day Smoker    Packs/day: 1.00    Years: 25.00    Types: Cigarettes  . Smokeless tobacco: Never Used  . Alcohol use No     Comment: beer on weekend  . Drug use: No  . Sexual activity: Not on file   Other Topics Concern  . Not on file   Social History Narrative  . No narrative on file     Current Outpatient Prescriptions:  .  HYDROcodone-acetaminophen (NORCO) 10-325 MG tablet, TAKE 1/2-1 TABLET BY ORAL ROUTE EVERY 6 HOURS AS NEEDED FOR PAIN, Disp: , Rfl: 0 .  tiZANidine (ZANAFLEX) 4 MG capsule, Take 1 capsule (4 mg total) by mouth 3 (three) times daily as  needed for muscle spasms., Disp: 30 capsule, Rfl: 0 .  diclofenac (VOLTAREN) 75 MG EC tablet, Take 1 tablet (75 mg total) by mouth 2 (two) times daily. Take with food (Patient not taking: Reported on 01/07/2016), Disp: 30 tablet, Rfl: 0 .  gabapentin (NEURONTIN) 300 MG capsule, TAKE 1 CAPSULE BY ORAL ROUTE 3 TIMES EVERY DAY, Disp: , Rfl: 1 .  ondansetron (ZOFRAN) 4 MG tablet, Take 1 tablet (4 mg total) by mouth daily as needed for nausea or vomiting. (Patient not taking: Reported on 01/07/2016), Disp: 20 tablet, Rfl: 1 .  oxyCODONE-acetaminophen (PERCOCET) 10-325 MG tablet, TAKE 1 TABLET BY ORAL ROUTE EVERY 8 HOURS AS NEEDED FOR PAIN, Disp: , Rfl: 0 .  predniSONE (STERAPRED UNI-PAK 21 TAB) 10 MG (21) TBPK tablet, Dispense one 6 day pack. Take as directed with food. (Patient not taking: Reported on 01/07/2016), Disp: 21 tablet, Rfl: 0  No Known Allergies   Review of Systems  Constitutional: Negative for chills and fever.  Gastrointestinal: Negative for abdominal pain.  Genitourinary: Negative for discharge, dysuria, hematuria, penile pain, scrotal swelling, testicular pain and urgency.     Objective  Vitals:   01/07/16 1558  BP: 127/73  Pulse: 80  Resp: 17  Temp: 98.5 F (36.9 C)  TempSrc: Oral  SpO2: 98%  Weight:  186 lb 9.6 oz (84.6 kg)  Height: 6\' 1"  (1.854 m)    Physical Exam  Constitutional: He is well-developed, well-nourished, and in no distress.  HENT:  Head: Normocephalic and atraumatic.  Cardiovascular: Normal rate, regular rhythm, S1 normal, S2 normal and normal heart sounds.   No murmur heard. Pulmonary/Chest: Effort normal and breath sounds normal. No respiratory distress. He has no rhonchi.  Genitourinary: Testes/scrotum normal and penis normal. Penis exhibits no lesions. No discharge found.  Skin: Skin is warm and dry.  Psychiatric: Mood, memory, affect and judgment normal.  Nursing note and vitals reviewed.     Assessment & Plan  1. Possible exposure to  STD Obtained standard STD exposure panel including testing for gonorrhea, chlamydia, HIV, hepatitis, and syphilis - STD Panel (HBSAG,HIV,RPR) - GC/Chlamydia Probe Amp   Wesley Adams Wesley A[facility] 01/07/2016 4:05 PM

## 2016-01-08 LAB — GC/CHLAMYDIA PROBE AMP
CT PROBE, AMP APTIMA: NOT DETECTED
GC Probe RNA: NOT DETECTED

## 2016-01-08 LAB — HIV ANTIBODY (ROUTINE TESTING W REFLEX): HIV 1&2 Ab, 4th Generation: NONREACTIVE

## 2016-01-08 LAB — HEPATITIS B SURFACE ANTIGEN: Hepatitis B Surface Ag: NEGATIVE

## 2016-01-08 LAB — RPR

## 2016-01-09 ENCOUNTER — Telehealth: Payer: Self-pay | Admitting: Family Medicine

## 2016-01-09 NOTE — Telephone Encounter (Signed)
COMPLETED

## 2016-01-23 ENCOUNTER — Encounter: Payer: Self-pay | Admitting: Emergency Medicine

## 2016-01-23 ENCOUNTER — Ambulatory Visit
Admission: EM | Admit: 2016-01-23 | Discharge: 2016-01-23 | Disposition: A | Discharge status: Home / Self Care | Payer: Managed Care, Other (non HMO) | Attending: Family Medicine | Admitting: Family Medicine

## 2016-01-23 DIAGNOSIS — J069 Acute upper respiratory infection, unspecified: Secondary | ICD-10-CM | POA: Diagnosis not present

## 2016-01-23 DIAGNOSIS — H6500 Acute serous otitis media, unspecified ear: Secondary | ICD-10-CM

## 2016-01-23 MED ORDER — AMOXICILLIN 875 MG PO TABS: 875.0000 mg | ORAL_TABLET | Freq: Two times a day (BID) | ORAL | 0 refills | Status: DC

## 2016-01-23 NOTE — ED Provider Notes (Signed)
[facility]    CSN: [id number] Arrival date & time: 01/23/16  1132     History   Chief Complaint Chief Complaint  Patient presents with  . Nasal Congestion  . Facial Pain    HPI Wesley Adams is a 50 y.o. male.   The history is provided by the patient.  URI  Presenting symptoms: congestion, ear pain, facial pain and fever   Severity:  Moderate Onset quality:  Sudden Duration:  3 days Timing:  Constant Progression:  Worsening Chronicity:  New Relieved by:  Nothing Ineffective treatments:  OTC medications Associated symptoms: headaches and sinus pain   Associated symptoms: no neck pain   Risk factors: sick contacts   Risk factors: not elderly, no chronic cardiac disease, no chronic kidney disease, no chronic respiratory disease, no diabetes mellitus, no immunosuppression, no recent illness and no recent travel     Past Medical History:  Diagnosis Date  . Chronic back pain   . Medical history non-contributory     Patient Active Problem List   Diagnosis Date Noted  . Acute low back pain without sciatica 11/04/2015  . Lumbar degenerative disc disease 01/30/2015    Past Surgical History:  Procedure Laterality Date  . BACK SURGERY  Feb 2016   cut disc off of nerve with Dr Arnoldo Morale  . FRACTURE SURGERY Right as child   rod in right femur       Home Medications    Prior to Admission medications   Medication Sig Start Date End Date Taking? Authorizing Provider  amoxicillin (AMOXIL) 875 MG tablet Take 1 tablet (875 mg total) by mouth 2 (two) times daily. 01/23/16   Norval Gable, MD  gabapentin (NEURONTIN) 300 MG capsule TAKE 1 CAPSULE BY ORAL ROUTE 3 TIMES EVERY DAY 06/27/15   Historical Provider, MD  HYDROcodone-acetaminophen (NORCO) 10-325 MG tablet TAKE 1/2-1 TABLET BY ORAL ROUTE EVERY 6 HOURS AS NEEDED FOR PAIN 07/21/15   Historical Provider, MD  ondansetron (ZOFRAN) 4 MG tablet Take 1 tablet (4 mg total) by mouth daily as needed for nausea or  vomiting. Patient not taking: Reported on 01/07/2016 06/17/15   Lavonia Drafts, MD  oxyCODONE-acetaminophen (PERCOCET) 10-325 MG tablet TAKE 1 TABLET BY ORAL ROUTE EVERY 8 HOURS AS NEEDED FOR PAIN 12/19/15   Historical Provider, MD    Family History History reviewed. No pertinent family history.  Social History Social History  Substance Use Topics  . Smoking status: Current Every Day Smoker    Packs/day: 1.00    Years: 25.00    Types: Cigarettes  . Smokeless tobacco: Never Used  . Alcohol use No     Comment: beer on weekend     Allergies   Review of patient's allergies indicates no known allergies.   Review of Systems Review of Systems  Constitutional: Positive for fever.  HENT: Positive for congestion and ear pain.   Musculoskeletal: Negative for neck pain.  Neurological: Positive for headaches.     Physical Exam Triage Vital Signs ED Triage Vitals  Enc Vitals Group     BP 01/23/16 1221 (!) 163/85     Pulse Rate 01/23/16 1221 82     Resp 01/23/16 1221 16     Temp 01/23/16 1221 97.5 F (36.4 C)     Temp Source 01/23/16 1221 Tympanic     SpO2 01/23/16 1221 100 %     Weight 01/23/16 1222 190 lb (86.2 kg)     Height 01/23/16 1222 6\' 1"  (1.854 m)  Head Circumference --      Peak Flow --      Pain Score 01/23/16 1223 4     Pain Loc --      Pain Edu? --      Excl. in [place]? --    No data found.   Updated Vital Signs BP (!) 163/85 (BP Location: Left Arm)   Pulse 82   Temp 97.5 F (36.4 C) (Tympanic)   Resp 16   Ht 6\' 1"  (1.854 m)   Wt 190 lb (86.2 kg)   SpO2 100%   BMI 25.07 kg/m   Visual Acuity Right Eye Distance:   Left Eye Distance:   Bilateral Distance:    Right Eye Near:   Left Eye Near:    Bilateral Near:     Physical Exam  Constitutional: He appears well-developed and well-nourished. No distress.  HENT:  Head: Normocephalic and atraumatic.  Right Ear: External ear and ear canal normal. Tympanic membrane is erythematous and bulging. A  middle ear effusion is present.  Left Ear: Tympanic membrane, external ear and ear canal normal.  Nose: Nose normal.  Mouth/Throat: Uvula is midline, oropharynx is clear and moist and mucous membranes are normal. No oropharyngeal exudate or tonsillar abscesses.  Eyes: Conjunctivae and EOM are normal. Pupils are equal, round, and reactive to light. Right eye exhibits no discharge. Left eye exhibits no discharge. No scleral icterus.  Neck: Normal range of motion. Neck supple. No tracheal deviation present. No thyromegaly present.  Cardiovascular: Normal rate, regular rhythm and normal heart sounds.   Pulmonary/Chest: Effort normal and breath sounds normal. No stridor. No respiratory distress. He has no wheezes. He has no rales. He exhibits no tenderness.  Lymphadenopathy:    He has no cervical adenopathy.  Neurological: He is alert.  Skin: Skin is warm and dry. No rash noted. He is not diaphoretic.  Nursing note and vitals reviewed.    UC Treatments / Results  Labs (all labs ordered are listed, but only abnormal results are displayed) Labs Reviewed - No data to display  EKG  EKG Interpretation None       Radiology No results found.  Procedures Procedures (including critical care time)  Medications Ordered in UC Medications - No data to display   Initial Impression / Assessment and Plan / UC Course  I have reviewed the triage vital signs and the nursing notes.  Pertinent labs & imaging results that were available during my care of the patient were reviewed by me and considered in my medical decision making (see chart for details).  Clinical Course      Final Clinical Impressions(s) / UC Diagnoses   Final diagnoses:  Acute upper respiratory infection  Acute serous otitis media, recurrence not specified, unspecified laterality    New Prescriptions Discharge Medication List as of 01/23/2016 12:40 PM    START taking these medications   Details  amoxicillin (AMOXIL)  875 MG tablet Take 1 tablet (875 mg total) by mouth 2 (two) times daily., Starting Fri 01/23/2016, Normal       1. diagnosis reviewed with patient 2. rx as per orders above; reviewed possible side effects, interactions, risks and benefits  3. Recommend supportive treatment with otc analgesics prn, rest 4. Follow-up prn if symptoms worsen or don't improve   Norval Gable, MD 01/23/16 1258

## 2016-01-23 NOTE — ED Triage Notes (Signed)
Patient c/o sinus pain and pressure, and nasal congestion for 2 days.

## 2016-06-24 ENCOUNTER — Other Ambulatory Visit: Payer: Self-pay | Admitting: Neurosurgery

## 2016-06-24 DIAGNOSIS — G8929 Other chronic pain: Secondary | ICD-10-CM

## 2016-06-24 DIAGNOSIS — M545 Low back pain: Principal | ICD-10-CM

## 2016-07-07 ENCOUNTER — Ambulatory Visit
Admission: RE | Admit: 2016-07-07 | Discharge: 2016-07-07 | Disposition: A | Discharge status: Home / Self Care | Payer: Managed Care, Other (non HMO) | Source: Ambulatory Visit | Attending: Neurosurgery | Admitting: Neurosurgery

## 2016-07-07 DIAGNOSIS — M48061 Spinal stenosis, lumbar region without neurogenic claudication: Secondary | ICD-10-CM | POA: Diagnosis not present

## 2016-07-07 DIAGNOSIS — G8929 Other chronic pain: Secondary | ICD-10-CM | POA: Diagnosis not present

## 2016-07-07 DIAGNOSIS — M5440 Lumbago with sciatica, unspecified side: Secondary | ICD-10-CM | POA: Insufficient documentation

## 2016-07-07 DIAGNOSIS — M2578 Osteophyte, vertebrae: Secondary | ICD-10-CM | POA: Diagnosis not present

## 2016-07-07 DIAGNOSIS — Z9889 Other specified postprocedural states: Secondary | ICD-10-CM | POA: Insufficient documentation

## 2016-07-07 DIAGNOSIS — Z981 Arthrodesis status: Secondary | ICD-10-CM | POA: Diagnosis not present

## 2016-07-07 DIAGNOSIS — M545 Low back pain: Secondary | ICD-10-CM

## 2016-07-07 MED ORDER — GADOBENATE DIMEGLUMINE 529 MG/ML IV SOLN
20.0000 mL | Freq: Once | INTRAVENOUS | Status: AC | PRN
  Administered 2016-07-07: 18 mL via INTRAVENOUS

## 2017-01-19 ENCOUNTER — Ambulatory Visit: Payer: Managed Care, Other (non HMO) | Admitting: Family Medicine

## 2017-03-23 HISTORY — PX: SPINAL CORD STIMULATOR INSERTION: SHX5378

## 2017-04-29 ENCOUNTER — Ambulatory Visit: Payer: Managed Care, Other (non HMO) | Admitting: Family Medicine

## 2017-04-29 ENCOUNTER — Encounter: Payer: Self-pay | Admitting: Family Medicine

## 2017-04-29 ENCOUNTER — Ambulatory Visit
Admission: RE | Admit: 2017-04-29 | Discharge: 2017-04-29 | Disposition: A | Discharge status: Home / Self Care | Payer: BLUE CROSS/BLUE SHIELD | Source: Ambulatory Visit | Attending: Family Medicine | Admitting: Family Medicine

## 2017-04-29 VITALS — BP 130/70 | HR 88 | Temp 98.2°F | Resp 18 | Ht 73.0 in | Wt 206.9 lb

## 2017-04-29 DIAGNOSIS — Z1211 Encounter for screening for malignant neoplasm of colon: Secondary | ICD-10-CM

## 2017-04-29 DIAGNOSIS — R1013 Epigastric pain: Secondary | ICD-10-CM | POA: Diagnosis not present

## 2017-04-29 DIAGNOSIS — Z9689 Presence of other specified functional implants: Secondary | ICD-10-CM | POA: Insufficient documentation

## 2017-04-29 DIAGNOSIS — Z1212 Encounter for screening for malignant neoplasm of rectum: Secondary | ICD-10-CM

## 2017-04-29 DIAGNOSIS — I700 Atherosclerosis of aorta: Secondary | ICD-10-CM | POA: Diagnosis not present

## 2017-04-29 HISTORY — DX: Presence of other specified functional implants: Z96.89

## 2017-04-29 LAB — BASIC METABOLIC PANEL WITH GFR
BUN: 12 mg/dL (ref 7–25)
CALCIUM: 9.4 mg/dL (ref 8.6–10.3)
CO2: 27 mmol/L (ref 20–32)
Chloride: 107 mmol/L (ref 98–110)
Creat: 0.89 mg/dL (ref 0.70–1.33)
GFR, EST AFRICAN AMERICAN: 115 mL/min/{1.73_m2} (ref >=60)
GFR, EST NON AFRICAN AMERICAN: 99 mL/min/{1.73_m2} (ref >=60)
Glucose, Bld: 109 mg/dL — ABNORMAL HIGH (ref 65–99)
POTASSIUM: 3.9 mmol/L (ref 3.5–5.3)
Sodium: 141 mmol/L (ref 135–146)

## 2017-04-29 LAB — CBC
HEMATOCRIT: 43.3 % (ref 38.5–50.0)
Hemoglobin: 14.7 g/dL (ref 13.2–17.1)
MCH: 29.6 pg (ref 27.0–33.0)
MCHC: 33.9 g/dL (ref 32.0–36.0)
MCV: 87.1 fL (ref 80.0–100.0)
MPV: 10.7 fL (ref 7.5–12.5)
Platelets: 208 10*3/uL (ref 140–400)
RBC: 4.97 10*6/uL (ref 4.20–5.80)
RDW: 13.5 % (ref 11.0–15.0)
WBC: 7.5 10*3/uL (ref 3.8–10.8)

## 2017-04-29 MED ORDER — OMEPRAZOLE 20 MG PO CPDR: 20.0000 mg | DELAYED_RELEASE_CAPSULE | Freq: Two times a day (BID) | ORAL | 0 refills | Status: DC

## 2017-04-29 MED ORDER — IOPAMIDOL (ISOVUE-300) INJECTION 61%
100.0000 mL | Freq: Once | INTRAVENOUS | Status: AC | PRN
  Administered 2017-04-29: 100 mL via INTRAVENOUS

## 2017-04-29 NOTE — Addendum Note (Signed)
Addended by: Saunders Glance A on: 04/29/2017 12:05 PM   Modules accepted: Orders

## 2017-04-29 NOTE — Patient Instructions (Addendum)
Food Choices for Gastroesophageal Reflux Disease, Adult When you have gastroesophageal reflux disease (GERD), the foods you eat and your eating habits are very important. Choosing the right foods can help ease your discomfort. What guidelines do I need to follow?  Choose fruits, vegetables, whole grains, and low-fat dairy products.  Choose low-fat meat, fish, and poultry.  Limit fats such as oils, salad dressings, butter, nuts, and avocado.  Keep a food diary. This helps you identify foods that cause symptoms.  Avoid foods that cause symptoms. These may be different for everyone.  Eat small meals often instead of 3 large meals a day.  Eat your meals slowly, in a place where you are relaxed.  Limit fried foods.  Cook foods using methods other than frying.  Avoid drinking alcohol.  Avoid drinking large amounts of liquids with your meals.  Avoid bending over or lying down until 2-3 hours after eating. What foods are not recommended? These are some foods and drinks that may make your symptoms worse: Vegetables  Tomatoes. Tomato juice. Tomato and spaghetti sauce. Chili peppers. Onion and garlic. Horseradish. Fruits  Oranges, grapefruit, and lemon (fruit and juice). Meats  High-fat meats, fish, and poultry. This includes hot dogs, ribs, ham, sausage, salami, and bacon. Dairy  Whole milk and chocolate milk. Sour cream. Cream. Butter. Ice cream. Cream cheese. Drinks  Coffee and tea. Bubbly (carbonated) drinks or energy drinks. Condiments  Hot sauce. Barbecue sauce. Sweets/Desserts  Chocolate and cocoa. Donuts. Peppermint and spearmint. Fats and Oils  High-fat foods. This includes French fries and potato chips. Other  Vinegar. Strong spices. This includes black pepper, white pepper, red pepper, cayenne, curry powder, cloves, ginger, and chili powder. The items listed above may not be a complete list of foods and drinks to avoid. Contact your dietitian for more information.    This information is not intended to replace advice given to you by your health care provider. Make sure you discuss any questions you have with your health care provider. Document Released: 10/12/2011 Document Revised: 09/18/2015 Document Reviewed: 02/14/2013 Elsevier Interactive Patient Education  2017 [employer]  

## 2017-04-29 NOTE — Progress Notes (Addendum)
Name: Wesley Adams   MRN: [id number]    DOB: Aug 27, 1965   Date:04/29/2017       Progress Note  Subjective  Chief Complaint  Chief Complaint  Patient presents with  . Abdominal Pain    since having surgery on November 28 for back stimulator.     HPI  Patient presents with concern for abdominal pain after having a back stimulator placed on 03/23/2017.  This was performed by Dr. Arnoldo Morale with [facility]. Pt had follow up about 3 weeks ago and noted this ongoing abdominal pain, Dr. Arnoldo Morale recommended he see his PCP for possible CT scan.   - Abdominal pain is central/upper pain, worse when he turns the stimulator up.  Also made worse after meals and when laying down at night.  Endorses intermittent nausea; no vomiting, no diarrhea, no heartburn or regurgitation, no blood in stools or dark/tarry stools.  Does have chronic constipation likely secondary to opioid use per patient. Having trouble sleeping because he is having discomfort around the stimulator insertion area as well.  Does not take NSAIDS. - Next follow up with Dr. Arnoldo Morale is in March 2019.  He does note that back pain is about 60% improved. - Denies weakness in extremities; does have some LLE stiffness and numbness/tingling - this has been discussed with Dr. Arnoldo Morale. - Pt due for colonoscopy - will refer today.  Patient Active Problem List   Diagnosis Date Noted  . Acute low back pain without sciatica 11/04/2015  . Lumbar degenerative disc disease 01/30/2015    Social History   Tobacco Use  . Smoking status: Current Every Day Smoker    Packs/day: 1.00    Years: 25.00    Pack years: 25.00    Types: Cigarettes  . Smokeless tobacco: Never Used  Substance Use Topics  . Alcohol use: No    Comment: beer on weekend    Current Outpatient Medications:  .  oxyCODONE-acetaminophen (PERCOCET) 10-325 MG tablet, TAKE 1 TABLET BY ORAL ROUTE EVERY 8 HOURS AS NEEDED FOR PAIN, Disp: , Rfl: 0 .  cyclobenzaprine  (FLEXERIL) 10 MG tablet, TAKE 1 TABLET BY ORAL ROUTE 3 TIMES EVERY DAY AS NEEDED FOR MUSCLE SPASMS, Disp: , Rfl: 1  No Known Allergies  ROS  Constitutional: Negative for fever or weight change.  Respiratory: Negative for cough and shortness of breath.   Cardiovascular: Negative for chest pain or palpitations.  Gastrointestinal: Positive for abdominal pain, no bowel changes. No urinary symptoms. Musculoskeletal: Negative for gait problem or joint swelling.  Skin: Negative for rash.  Neurological: Negative for dizziness or headache.  No other specific complaints in a complete review of systems (except as listed in HPI above).  Objective  Vitals:   04/29/17 1121  BP: 130/70  Pulse: 88  Resp: 18  Temp: 98.2 F (36.8 C)  TempSrc: Oral  SpO2: 97%  Weight: 206 lb 14.4 oz (93.8 kg)  Height: 6\' 1"  (1.854 m)   Body mass index is 27.3 kg/m.  Nursing Note and Vital Signs reviewed.  Physical Exam  Constitutional: Patient appears well-developed and well-nourished.  No distress.  HEENT: head atraumatic, normocephalic Cardiovascular: Normal rate, regular rhythm, S1/S2 present.  No murmur or rub heard. No BLE edema. Pulmonary/Chest: Effort normal and breath sounds clear. No respiratory distress or retractions. Abdominal: Soft and tenderness to central abdomen is noted, bowel sounds present x4 quadrants. Psychiatric: Patient has a normal mood and affect. behavior is normal. Judgment and thought content normal. Musculoskeletal: Normal  range of motion, no joint effusions. No gross deformities Neurological: he is alert and oriented to person, place, and time. No cranial nerve deficit. Coordination, balance, strength, speech and gait are normal.  Skin: Skin is warm and dry. No rash noted. No erythema. Surgical scar is healed, edges well approximated without erythema, exudate, or fluctuance.  Some tenderness in the area surrounding the stimulator. Psychiatric: Patient has a normal mood and  affect. behavior is normal. Judgment and thought content normal.  No results found for this or any previous visit (from the past 2160 hour(s)).   Assessment & Plan  1. Epigastric pain - omeprazole (PRILOSEC) 20 MG capsule; Take 1 capsule (20 mg total) by mouth 2 (two) times daily before a meal.  Dispense: 60 capsule; Refill: 0 - CT ABDOMEN PELVIS W WO CONTRAST; Future - CBC - BASIC METABOLIC PANEL WITH GFR  2. Spinal cord stimulator status - CT ABDOMEN PELVIS W WO CONTRAST; Future - Surgical and Office notes/imaging requested from [facility]  3. Screening for colorectal cancer - Ambulatory referral to Gastroenterology  -Red flags and when to present for emergency care or RTC including fever >101.50F, chest pain, shortness of breath, new/worsening/un-resolving symptoms, blood in stools/dark and tarry stools, vomiting, distended abdomen, weakness in extremities, saddle anesthesia reviewed with patient at time of visit. Follow up and care instructions discussed and provided in AVS.

## 2017-04-29 NOTE — Addendum Note (Signed)
Addended by: Hubbard Hartshorn on: 04/29/2017 11:56 AM   Modules accepted: Orders

## 2017-05-12 ENCOUNTER — Ambulatory Visit: Payer: Managed Care, Other (non HMO)

## 2017-05-13 ENCOUNTER — Telehealth: Payer: Self-pay | Admitting: Gastroenterology

## 2017-05-13 NOTE — Telephone Encounter (Signed)
Patient left a voice message that he was called to schedule a colonoscopy

## 2017-05-16 ENCOUNTER — Other Ambulatory Visit: Payer: Self-pay

## 2017-05-16 DIAGNOSIS — Z1211 Encounter for screening for malignant neoplasm of colon: Secondary | ICD-10-CM

## 2017-05-19 ENCOUNTER — Ambulatory Visit: Payer: BLUE CROSS/BLUE SHIELD | Admitting: Family Medicine

## 2017-05-19 ENCOUNTER — Encounter: Payer: Self-pay | Admitting: Emergency Medicine

## 2017-05-19 ENCOUNTER — Encounter: Payer: Self-pay | Admitting: Family Medicine

## 2017-05-19 VITALS — BP 118/72 | HR 72 | Temp 99.0°F | Resp 18 | Ht 73.0 in | Wt 205.9 lb

## 2017-05-19 DIAGNOSIS — J0110 Acute frontal sinusitis, unspecified: Secondary | ICD-10-CM | POA: Diagnosis not present

## 2017-05-19 MED ORDER — FLUTICASONE PROPIONATE 50 MCG/ACT NA SUSP: 2.0000 | Freq: Every day | NASAL | 0 refills | Status: DC

## 2017-05-19 MED ORDER — AMOXICILLIN-POT CLAVULANATE 875-125 MG PO TABS: 1.0000 | ORAL_TABLET | Freq: Two times a day (BID) | ORAL | 0 refills | Status: AC

## 2017-05-19 NOTE — Progress Notes (Signed)
Name: Wesley Adams   MRN: [id number]    DOB: [birth year]   Date:05/19/2017       Progress Note  Subjective  Chief Complaint  Chief Complaint  Patient presents with  . Sinusitis    odor, bad taste in mouth, headache, bodyache for 3 days    HPI  Pt presents with 2-3 days of body aches, subjective fevers/chills very mild cough (is a current smoker), decreased appetite, mild nausea. Also notes 2 weeks of nasal congestion - got better for a day or two, then the last couple of days it became worse and he noticed a foul smells/odor.  Denies: vomiting, diarrhea, chest pain, shortness of breath, abdominal pain.  Recent sick contacts include his daughters - two with URI's, one with strep and the flu.  Has tried theraflu and OTC cold medications yesterday which did not help.  Patient Active Problem List   Diagnosis Date Noted  . Spinal cord stimulator status 04/29/2017  . Acute low back pain without sciatica 11/04/2015  . Lumbar degenerative disc disease 01/30/2015    Social History   Tobacco Use  . Smoking status: Current Every Day Smoker    Packs/day: 1.00    Years: 25.00    Pack years: 25.00    Types: Cigarettes  . Smokeless tobacco: Never Used  Substance Use Topics  . Alcohol use: No    Comment: beer on weekend    Current Outpatient Medications:  .  cyclobenzaprine (FLEXERIL) 10 MG tablet, TAKE 1 TABLET BY ORAL ROUTE 3 TIMES EVERY DAY AS NEEDED FOR MUSCLE SPASMS, Disp: , Rfl: 1 .  omeprazole (PRILOSEC) 20 MG capsule, Take 1 capsule (20 mg total) by mouth 2 (two) times daily before a meal., Disp: 60 capsule, Rfl: 0 .  oxyCODONE-acetaminophen (PERCOCET) 10-325 MG tablet, TAKE 1 TABLET BY ORAL ROUTE EVERY 8 HOURS AS NEEDED FOR PAIN, Disp: , Rfl: 0  No Known Allergies  ROS  Ten systems reviewed and is negative except as mentioned in HPI  Objective  Vitals:   05/19/17 1112  BP: 118/72  Pulse: 72  Resp: 18  Temp: 99 F (37.2 C)  TempSrc: Oral  SpO2: 97%  Weight: 205  lb 14.4 oz (93.4 kg)  Height: 6\' 1"  (1.854 m)   Body mass index is 27.17 kg/m.  Nursing Note and Vital Signs reviewed.  Physical Exam  Constitutional: Patient appears well-developed and well-nourished.  No distress.  HEENT: head atraumatic, normocephalic, pupils equal and reactive to light, EOM's intact, TM's without erythema or bulging, +frontal sinus tenderness, neck supple with bilateral submandibular lymphadenopathy, oropharynx pink and moist without exudate Cardiovascular: Normal rate, regular rhythm, S1/S2 present.  No murmur or rub heard. No BLE edema. Pulmonary/Chest: Effort normal and breath sounds clear. No respiratory distress or retractions. Psychiatric: Patient has a normal mood and affect. behavior is normal. Judgment and thought content normal.  No results found for this or any previous visit (from the past 72 hour(s)).  Assessment & Plan  1. Acute non-recurrent frontal sinusitis - amoxicillin-clavulanate (AUGMENTIN) 875-125 MG tablet; Take 1 tablet by mouth 2 (two) times daily for 10 days.  Dispense: 20 tablet; Refill: 0 - fluticasone (FLONASE) 50 MCG/ACT nasal spray; Place 2 sprays into both nostrils daily.  Dispense: 16 g; Refill: 0  -Red flags and when to present for emergency care or RTC including fever >101.42F, chest pain, shortness of breath, new/worsening/un-resolving symptoms, pain with ocular movements, periorbital swelling reviewed with patient at time of visit. Follow up and  care instructions discussed and provided in AVS.

## 2017-05-19 NOTE — Patient Instructions (Addendum)

## 2017-05-25 ENCOUNTER — Other Ambulatory Visit: Payer: Self-pay | Admitting: Family Medicine

## 2017-05-25 DIAGNOSIS — R1013 Epigastric pain: Secondary | ICD-10-CM

## 2017-05-25 MED ORDER — OMEPRAZOLE 20 MG PO CPDR: 20.0000 mg | DELAYED_RELEASE_CAPSULE | Freq: Two times a day (BID) | ORAL | 0 refills | Status: DC

## 2017-05-25 NOTE — Progress Notes (Signed)
Patient is scheduling appointment. He want to schedule with you since he was a patient of Manuella Ghazi. Is this ok

## 2017-05-25 NOTE — Progress Notes (Signed)
That's fine - please ensure he is aware of our controlled substance policy. Thank you!

## 2017-05-25 NOTE — Progress Notes (Signed)
Patient has a pain management doctor in [place]

## 2017-05-25 NOTE — Progress Notes (Signed)
Received Rx refill request for Omeprazole.  30-day supply will be provided, pt needs regular follow up with PCP to discuss.

## 2017-05-26 ENCOUNTER — Other Ambulatory Visit: Payer: Self-pay

## 2017-05-26 ENCOUNTER — Encounter: Payer: Self-pay | Admitting: *Deleted

## 2017-05-27 ENCOUNTER — Ambulatory Visit: Payer: BLUE CROSS/BLUE SHIELD | Admitting: Family Medicine

## 2017-05-27 ENCOUNTER — Encounter: Payer: Self-pay | Admitting: Family Medicine

## 2017-05-27 ENCOUNTER — Other Ambulatory Visit: Payer: Self-pay | Admitting: Neurosurgery

## 2017-05-27 VITALS — BP 112/60 | HR 82 | Temp 98.2°F | Resp 18 | Ht 73.0 in | Wt 209.9 lb

## 2017-05-27 DIAGNOSIS — Z720 Tobacco use: Secondary | ICD-10-CM | POA: Diagnosis not present

## 2017-05-27 DIAGNOSIS — R1013 Epigastric pain: Secondary | ICD-10-CM | POA: Diagnosis not present

## 2017-05-27 DIAGNOSIS — Z9689 Presence of other specified functional implants: Secondary | ICD-10-CM | POA: Diagnosis not present

## 2017-05-27 DIAGNOSIS — M5136 Other intervertebral disc degeneration, lumbar region: Secondary | ICD-10-CM | POA: Diagnosis not present

## 2017-05-27 DIAGNOSIS — M51369 Other intervertebral disc degeneration, lumbar region without mention of lumbar back pain or lower extremity pain: Secondary | ICD-10-CM

## 2017-05-27 DIAGNOSIS — F1190 Opioid use, unspecified, uncomplicated: Secondary | ICD-10-CM | POA: Diagnosis not present

## 2017-05-27 MED ORDER — OMEPRAZOLE 20 MG PO CPDR: 20.0000 mg | DELAYED_RELEASE_CAPSULE | Freq: Every day | ORAL | 0 refills | Status: DC

## 2017-05-27 MED ORDER — RANITIDINE HCL 150 MG PO TABS: 150.0000 mg | ORAL_TABLET | Freq: Every day | ORAL | 0 refills | Status: DC

## 2017-05-27 NOTE — Patient Instructions (Addendum)

## 2017-05-27 NOTE — Progress Notes (Signed)
Name: Wesley Adams   MRN: [id number]    DOB: [birth year]   Date:05/27/2017       Progress Note  Subjective  Chief Complaint  Chief Complaint  Patient presents with  . Abdominal Pain    follow up, stimulator removal on February 21  . Medication Refill    HPI  Pt is a transfer from PCP Dr. Manuella Ghazi to myself today.  He has been seen in the past by myself for abdominal pain and presents today to follow up.  Epigastric Pain/GERD: Still worse when he turns the spinal stimulator up.  Also made worse after meals and when laying down at night.  Endorses intermittent nausea; no vomiting, no diarrhea, no heartburn or regurgitation, no blood in stools or dark/tarry stools.  Does have chronic constipation likely secondary to opioid use per patient.  Does not take NSAIDS.  Discussed risk of long-term PPI use, we will decrease dose to 20mg  once daily and add zantac once daily.  Spinal Cord Stimulator and Chronic Back Pain: Has been having ongoing pain since last visit for the same on 04/29/17 - CT scan of abdomen was negative.  He is still getting excruciating pain in the abdomen and genitals when the stimulator is turned on; currently has stimulator turned off until removal surgery.  Went back to see Dr. Arnoldo Morale on 06/16/2017 and it was agreed that he will have the spinal stimulator removed February 21.  Sees Dr. Brien Few in [place] for pain management at this time - he would like to be referred to Dr. Holley Raring who is local to [facility].  Colonoscopy is scheduled for 06/01/2017.  Tobacco Use: Was smoking 1ppd, down to 1/2 ppd since the beginning of the year; denies chest pain or shortness of breath.  He smokes more when he is stressed. Plans to quit in the next couple of months - wants to gradually taper.  Patient Active Problem List   Diagnosis Date Noted  . Spinal cord stimulator status 04/29/2017  . Acute low back pain without sciatica 11/04/2015  . Lumbar degenerative disc disease 01/30/2015    Past  Surgical History:  Procedure Laterality Date  . BACK SURGERY  Feb 2016   cut disc off of nerve with Dr Arnoldo Morale  . FRACTURE SURGERY Right as child   rod in right femur  . SPINAL CORD STIMULATOR INSERTION  03/23/2017   Dr Newman Pies, [facility]    No family history on file.  Social History   Socioeconomic History  . Marital status: Single    Spouse name: Not on file  . Number of children: Not on file  . Years of education: Not on file  . Highest education level: Not on file  Social Needs  . Financial resource strain: Not on file  . Food insecurity - worry: Not on file  . Food insecurity - inability: Not on file  . Transportation needs - medical: Not on file  . Transportation needs - non-medical: Not on file  Occupational History  . Not on file  Tobacco Use  . Smoking status: Current Every Day Smoker    Packs/day: 1.00    Years: 25.00    Pack years: 25.00    Types: Cigarettes  . Smokeless tobacco: Never Used  . Tobacco comment: down to 1/2 PPD  Substance and Sexual Activity  . Alcohol use: Yes    Alcohol/week: 1.8 oz    Types: 3 Cans of beer per week    Comment: beer on weekend  .  Drug use: No  . Sexual activity: Not on file  Other Topics Concern  . Not on file  Social History Narrative  . Not on file     Current Outpatient Medications:  .  cyclobenzaprine (FLEXERIL) 10 MG tablet, TAKE 1 TABLET BY ORAL ROUTE 3 TIMES EVERY DAY AS NEEDED FOR MUSCLE SPASMS, Disp: , Rfl: 1 .  fluticasone (FLONASE) 50 MCG/ACT nasal spray, Place 2 sprays into both nostrils daily., Disp: 16 g, Rfl: 0 .  HYDROcodone-acetaminophen (NORCO) 10-325 MG tablet, Take 1 tablet by mouth every 6 (six) hours as needed., Disp: , Rfl:  .  omeprazole (PRILOSEC) 20 MG capsule, Take 1 capsule (20 mg total) by mouth 2 (two) times daily before a meal., Disp: 60 capsule, Rfl: 0 .  oxyCODONE-acetaminophen (PERCOCET) 10-325 MG tablet, TAKE 1 TABLET BY ORAL ROUTE EVERY 8 HOURS AS NEEDED FOR  PAIN, Disp: , Rfl: 0 .  amoxicillin-clavulanate (AUGMENTIN) 875-125 MG tablet, Take 1 tablet by mouth 2 (two) times daily for 10 days. (Patient not taking: Reported on 05/27/2017), Disp: 20 tablet, Rfl: 0  No Known Allergies  ROS Constitutional: Negative for fever or weight change.  Respiratory: Negative for cough and shortness of breath.   Cardiovascular: Negative for chest pain or palpitations.  Gastrointestinal: See HPI  Musculoskeletal: Negative for gait problem or joint swelling.  Skin: Negative for rash.  Neurological: Negative for dizziness or headache.  No other specific complaints in a complete review of systems (except as listed in HPI above).  Objective  Vitals:   05/27/17 1541  BP: 112/60  Pulse: 82  Resp: 18  Temp: 98.2 F (36.8 C)  TempSrc: Oral  SpO2: 97%  Weight: 209 lb 14.4 oz (95.2 kg)  Height: 6\' 1"  (1.854 m)   Body mass index is 27.69 kg/m.  Physical Exam Constitutional: Patient appears well-developed and well-nourished. No distress.  HENT: Head: Normocephalic and atraumatic. Nose: Nose normal. Mouth/Throat: Oropharynx is clear and moist. No oropharyngeal exudate. Eyes: Conjunctivae and EOM are normal. Pupils are equal, round, and reactive to light. No scleral icterus.  Neck: Normal range of motion. Neck supple. No JVD present. No thyromegaly present.  Cardiovascular: Normal rate, regular rhythm and normal heart sounds.  No murmur heard. No BLE edema. Pulmonary/Chest: Effort normal and breath sounds normal. No respiratory distress. Abdominal: Soft and tenderness to central abdomen is noted, bowel sounds present x4 quadrants. Skin: Skin is warm and dry. No rash noted. No erythema. Surgical scar is healed, edges well approximated without erythema, exudate, or fluctuance.  Some tenderness in the area surrounding the stimulator. Psychiatric: Patient has a normal mood and affect. behavior is normal. Judgment and thought content normal.  No results found for this  or any previous visit (from the past 72 hour(s)).  PHQ2/9: Depression screen [facility] 2/9 04/29/2017 01/07/2016 11/04/2015 07/31/2015  Decreased Interest 0 0 0 0  Down, Depressed, Hopeless 0 0 0 0  PHQ - 2 Score 0 0 0 0  Fall Risk: Fall Risk  04/29/2017 01/07/2016 11/04/2015 07/31/2015  Falls in the past year? No No No No   Assessment & Plan  1. Epigastric pain - omeprazole (PRILOSEC) 20 MG capsule; Take 1 capsule (20 mg total) by mouth daily.  Dispense: 90 capsule; Refill: 0 - ranitidine (ZANTAC) 150 MG tablet; Take 1 tablet (150 mg total) by mouth daily.  Dispense: 90 tablet; Refill: 0 - We will continue to monitor his pain - we will decrease omeprazole to once daily, and substitute Zantac for  his 2nd dose of zantac with the eventual goal of stopping omeprazole and taking only zantac.  Encouraged him to continue GERD diet to avoid triggers and elevate HOB.  Follow up after spinal stimulator is removed.  2. Tobacco use - Continue cessation efforts - pt is congratulated on these efforts and encouraged to continue, to choose a quit date, and to RTC if he would like any medications or counseling referrals.  3. Spinal cord stimulator status - Ambulatory referral to Pain Clinic 4. Lumbar degenerative disc disease - Ambulatory referral to Pain Clinic 5. Chronic, continuous use of opioids - Ambulatory referral to Pain Clinic   Return for CPE w/ fasting 6-8 weeks. We will follow up with patient after stimulator is removed for CPE and to monitor epigastric pain.

## 2017-05-31 NOTE — Discharge Instructions (Signed)
General Anesthesia, Adult, Care After °These instructions provide you with information about caring for yourself after your procedure. Your health care provider may also give you more specific instructions. Your treatment has been planned according to current medical practices, but problems sometimes occur. Call your health care provider if you have any problems or questions after your procedure. °What can I expect after the procedure? °After the procedure, it is common to have: °· Vomiting. °· A sore throat. °· Mental slowness. ° °It is common to feel: °· Nauseous. °· Cold or shivery. °· Sleepy. °· Tired. °· Sore or achy, even in parts of your body where you did not have surgery. ° °Follow these instructions at home: °For at least 24 hours after the procedure: °· Do not: °? Participate in activities where you could fall or become injured. °? Drive. °? Use heavy machinery. °? Drink alcohol. °? Take sleeping pills or medicines that cause drowsiness. °? Make important decisions or sign legal documents. °? Take care of children on your own. °· Rest. °Eating and drinking °· If you vomit, drink water, juice, or soup when you can drink without vomiting. °· Drink enough fluid to keep your urine clear or pale yellow. °· Make sure you have little or no nausea before eating solid foods. °· Follow the diet recommended by your health care provider. °General instructions °· Have a responsible adult stay with you until you are awake and alert. °· Return to your normal activities as told by your health care provider. Ask your health care provider what activities are safe for you. °· Take over-the-counter and prescription medicines only as told by your health care provider. °· If you smoke, do not smoke without supervision. °· Keep all follow-up visits as told by your health care provider. This is important. °Contact a health care provider if: °· You continue to have nausea or vomiting at home, and medicines are not helpful. °· You  cannot drink fluids or start eating again. °· You cannot urinate after 8-12 hours. °· You develop a skin rash. °· You have fever. °· You have increasing redness at the site of your procedure. °Get help right away if: °· You have difficulty breathing. °· You have chest pain. °· You have unexpected bleeding. °· You feel that you are having a life-threatening or urgent problem. °This information is not intended to replace advice given to you by your health care provider. Make sure you discuss any questions you have with your health care provider. °Document Released: 07/19/2000 Document Revised: 09/15/2015 Document Reviewed: 03/27/2015 °[employer]vier Interactive Patient Education © 2018 [employer] ° °

## 2017-06-01 ENCOUNTER — Ambulatory Visit: Payer: BLUE CROSS/BLUE SHIELD | Admitting: Anesthesiology

## 2017-06-01 ENCOUNTER — Encounter
Admission: RE | Disposition: A | Discharge status: Home / Self Care | Payer: Self-pay | Source: Ambulatory Visit | Attending: Gastroenterology

## 2017-06-01 ENCOUNTER — Ambulatory Visit
Admission: RE | Admit: 2017-06-01 | Discharge: 2017-06-01 | Disposition: A | Discharge status: Home / Self Care | Payer: BLUE CROSS/BLUE SHIELD | Source: Ambulatory Visit | Attending: Gastroenterology | Admitting: Gastroenterology

## 2017-06-01 DIAGNOSIS — F17200 Nicotine dependence, unspecified, uncomplicated: Secondary | ICD-10-CM | POA: Insufficient documentation

## 2017-06-01 DIAGNOSIS — D125 Benign neoplasm of sigmoid colon: Secondary | ICD-10-CM

## 2017-06-01 DIAGNOSIS — K5730 Diverticulosis of large intestine without perforation or abscess without bleeding: Secondary | ICD-10-CM | POA: Insufficient documentation

## 2017-06-01 DIAGNOSIS — Z1211 Encounter for screening for malignant neoplasm of colon: Secondary | ICD-10-CM | POA: Insufficient documentation

## 2017-06-01 DIAGNOSIS — K2950 Unspecified chronic gastritis without bleeding: Secondary | ICD-10-CM | POA: Insufficient documentation

## 2017-06-01 DIAGNOSIS — Z79899 Other long term (current) drug therapy: Secondary | ICD-10-CM | POA: Diagnosis not present

## 2017-06-01 DIAGNOSIS — G8929 Other chronic pain: Secondary | ICD-10-CM | POA: Diagnosis not present

## 2017-06-01 DIAGNOSIS — R1013 Epigastric pain: Secondary | ICD-10-CM | POA: Diagnosis not present

## 2017-06-01 DIAGNOSIS — K635 Polyp of colon: Secondary | ICD-10-CM | POA: Insufficient documentation

## 2017-06-01 DIAGNOSIS — K269 Duodenal ulcer, unspecified as acute or chronic, without hemorrhage or perforation: Secondary | ICD-10-CM | POA: Diagnosis not present

## 2017-06-01 DIAGNOSIS — Z7951 Long term (current) use of inhaled steroids: Secondary | ICD-10-CM | POA: Diagnosis not present

## 2017-06-01 DIAGNOSIS — K228 Other specified diseases of esophagus: Secondary | ICD-10-CM | POA: Insufficient documentation

## 2017-06-01 HISTORY — PX: COLONOSCOPY WITH PROPOFOL: SHX5780

## 2017-06-01 HISTORY — PX: ESOPHAGOGASTRODUODENOSCOPY (EGD) WITH PROPOFOL: SHX5813

## 2017-06-01 HISTORY — PX: POLYPECTOMY: SHX5525

## 2017-06-01 SURGERY — COLONOSCOPY WITH PROPOFOL
Anesthesia: General | Wound class: Contaminated

## 2017-06-01 MED ORDER — FENTANYL CITRATE (PF) 100 MCG/2ML IJ SOLN: 25.0000 ug | INTRAMUSCULAR | Status: DC | PRN

## 2017-06-01 MED ORDER — MEPERIDINE HCL 25 MG/ML IJ SOLN: 6.2500 mg | INTRAMUSCULAR | Status: DC | PRN

## 2017-06-01 MED ORDER — LACTATED RINGERS IV SOLN
INTRAVENOUS | Status: DC
  Administered 2017-06-01: 07:00:00 via INTRAVENOUS

## 2017-06-01 MED ORDER — PROPOFOL 10 MG/ML IV BOLUS
INTRAVENOUS | Status: DC | PRN
  Administered 2017-06-01 (×2): 20 mg via INTRAVENOUS
  Administered 2017-06-01: 40 mg via INTRAVENOUS
  Administered 2017-06-01: 30 mg via INTRAVENOUS
  Administered 2017-06-01: 40 mg via INTRAVENOUS
  Administered 2017-06-01 (×2): 30 mg via INTRAVENOUS
  Administered 2017-06-01 (×2): 40 mg via INTRAVENOUS
  Administered 2017-06-01: 140 mg via INTRAVENOUS
  Administered 2017-06-01: 30 mg via INTRAVENOUS
  Administered 2017-06-01: 40 mg via INTRAVENOUS
  Administered 2017-06-01 (×2): 20 mg via INTRAVENOUS
  Administered 2017-06-01: 30 mg via INTRAVENOUS
  Administered 2017-06-01: 40 mg via INTRAVENOUS

## 2017-06-01 MED ORDER — STERILE WATER FOR IRRIGATION IR SOLN
Status: DC | PRN
  Administered 2017-06-01: 09:00:00

## 2017-06-01 MED ORDER — LIDOCAINE HCL (CARDIAC) 20 MG/ML IV SOLN
INTRAVENOUS | Status: DC | PRN
  Administered 2017-06-01: 50 mg via INTRAVENOUS

## 2017-06-01 MED ORDER — OXYCODONE HCL 5 MG/5ML PO SOLN: 5.0000 mg | Freq: Once | ORAL | Status: DC | PRN

## 2017-06-01 MED ORDER — SODIUM CHLORIDE 0.9 % IV SOLN: INTRAVENOUS | Status: DC

## 2017-06-01 MED ORDER — PROMETHAZINE HCL 25 MG/ML IJ SOLN: 6.2500 mg | INTRAMUSCULAR | Status: DC | PRN

## 2017-06-01 MED ORDER — OMEPRAZOLE 40 MG PO CPDR: 20.0000 mg | DELAYED_RELEASE_CAPSULE | Freq: Two times a day (BID) | ORAL | 0 refills | Status: DC

## 2017-06-01 MED ORDER — GLYCOPYRROLATE 0.2 MG/ML IJ SOLN
INTRAMUSCULAR | Status: DC | PRN
  Administered 2017-06-01: 0.1 mg via INTRAVENOUS

## 2017-06-01 MED ORDER — OXYCODONE HCL 5 MG PO TABS: 5.0000 mg | ORAL_TABLET | Freq: Once | ORAL | Status: DC | PRN

## 2017-06-01 SURGICAL SUPPLY — 25 items
CANISTER SUCT 1200ML W/VALVE (MISCELLANEOUS) ×3 IMPLANT
CLIP HMST 235XBRD CATH ROT (MISCELLANEOUS) IMPLANT
CLIP RESOLUTION 360 11X235 (MISCELLANEOUS)
ELECT REM PT RETURN 9FT ADLT (ELECTROSURGICAL)
ELECTRODE REM PT RTRN 9FT ADLT (ELECTROSURGICAL) IMPLANT
FCP ESCP3.2XJMB 240X2.8X (MISCELLANEOUS)
FORCEPS BIOP RAD 4 LRG CAP 4 (CUTTING FORCEPS) IMPLANT
FORCEPS BIOP RJ4 240 W/NDL (MISCELLANEOUS)
FORCEPS ESCP3.2XJMB 240X2.8X (MISCELLANEOUS) IMPLANT
GOWN CVR UNV OPN BCK APRN NK (MISCELLANEOUS) ×4 IMPLANT
GOWN ISOL THUMB LOOP REG UNIV (MISCELLANEOUS) ×2
INJECTOR VARIJECT VIN23 (MISCELLANEOUS) IMPLANT
KIT DEFENDO VALVE AND CONN (KITS) IMPLANT
KIT ENDO PROCEDURE OLY (KITS) ×3 IMPLANT
MARKER SPOT ENDO TATTOO 5ML (MISCELLANEOUS) IMPLANT
PROBE APC STR FIRE (PROBE) IMPLANT
RETRIEVER NET ROTH 2.5X230 LF (MISCELLANEOUS) IMPLANT
SNARE COLD EXACTO (MISCELLANEOUS) ×3 IMPLANT
SNARE SHORT THROW 13M SML OVAL (MISCELLANEOUS) ×3 IMPLANT
SNARE SHORT THROW 30M LRG OVAL (MISCELLANEOUS) IMPLANT
SNARE SNG USE RND 15MM (INSTRUMENTS) IMPLANT
SPOT EX ENDOSCOPIC TATTOO (MISCELLANEOUS)
TRAP ETRAP POLY (MISCELLANEOUS) ×3 IMPLANT
VARIJECT INJECTOR VIN23 (MISCELLANEOUS)
WATER STERILE IRR 250ML POUR (IV SOLUTION) ×3 IMPLANT

## 2017-06-01 NOTE — Op Note (Signed)
[facility]ology Patient Name: Wesley Adams Procedure Date: 06/01/2017 8:22 AM MRN: [id number] Account #: [id number] Date of Birth: [birth year] Admit Type: Outpatient Age: 52 Room: [facility] OR ROOM 1 Gender: Male Note Status: Finalized Procedure:            Upper GI endoscopy Indications:          Epigastric abdominal pain Providers:            Lin Landsman MD, MD Referring MD:         Otila Back. Manuella Ghazi (Referring MD) Medicines:            Monitored Anesthesia Care Complications:        No immediate complications. Estimated blood loss:                        Minimal. Procedure:            Pre-Anesthesia Assessment:                       - Prior to the procedure, a History and Physical was                        performed, and patient medications and allergies were                        reviewed. The patient is competent. The risks and                        benefits of the procedure and the sedation options and                        risks were discussed with the patient. All questions                        were answered and informed consent was obtained.                        Patient identification and proposed procedure were                        verified by the physician, the nurse, the                        anesthesiologist, the anesthetist and the technician in                        the pre-procedure area in the procedure room. Mental                        Status Examination: alert and oriented. Airway                        Examination: normal oropharyngeal airway and neck                        mobility. Respiratory Examination: clear to                        auscultation. CV Examination: normal. Prophylactic  Antibiotics: The patient does not require prophylactic                        antibiotics. Prior Anticoagulants: The patient has                        taken no previous anticoagulant or antiplatelet agents.                       ASA Grade Assessment: III - A patient with severe                        systemic disease. After reviewing the risks and                        benefits, the patient was deemed in satisfactory                        condition to undergo the procedure. The anesthesia plan                        was to use monitored anesthesia care (MAC). Immediately                        prior to administration of medications, the patient was                        re-assessed for adequacy to receive sedatives. The                        heart rate, respiratory rate, oxygen saturations, blood                        pressure, adequacy of pulmonary ventilation, and                        response to care were monitored throughout the                        procedure. The physical status of the patient was                        re-assessed after the procedure.                       After obtaining informed consent, the endoscope was                        passed under direct vision. Throughout the procedure,                        the patient's blood pressure, pulse, and oxygen                        saturations were monitored continuously. The Olympus                        GIF-HQ190 Endoscope (S#. [id number]) was introduced                        through the mouth,  and advanced to the second part of                        duodenum. The upper GI endoscopy was accomplished                        without difficulty. The patient tolerated the procedure                        fairly well. Findings:      One non-bleeding superficial duodenal ulcer with a clean ulcer base       (Forrest Class III) was found in the duodenal bulb. The lesion was 5 mm       in largest dimension.      The second portion of the duodenum was normal.      Diffuse moderately erythematous mucosa without bleeding was found in the       gastric body and in the gastric antrum. Biopsies were taken with a cold       forceps  for Helicobacter pylori testing.      The cardia and gastric fundus were normal on retroflexion.      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      The Z-line was irregular. Impression:           - One non-bleeding duodenal ulcer with a clean ulcer                        base (Forrest Class III).                       - Normal second portion of the duodenum.                       - Erythematous mucosa in the gastric body and antrum.                        Biopsied.                       - Ectopic gastric mucosa in the upper third of the                        esophagus.                       - Z-line irregular. Recommendation:       - Await pathology results.                       - Use Prilosec (omeprazole) 40 mg PO BID for 3 months. Procedure Code(s):    --- Professional ---                       [phone], Esophagogastroduodenoscopy, flexible, transoral;                        with biopsy, single or multiple Diagnosis Code(s):    --- Professional ---                       K26.9, Duodenal  ulcer, unspecified as acute or chronic,                        without hemorrhage or perforation                       K31.89, Other diseases of stomach and duodenum                       Q40.2, Other specified congenital malformations of                        stomach                       K22.8, Other specified diseases of esophagus                       R10.13, Epigastric pain CPT copyright 2016 American Medical Association. All rights reserved. The codes documented in this report are preliminary and upon coder review may  be revised to meet current compliance requirements. Dr. Ulyess Mort Lin Landsman MD, MD 06/01/2017 8:40:00 AM This report has been signed electronically. Number of Addenda: 0 Note Initiated On: 06/01/2017 8:22 AM Total Procedure Duration: 0 hours 8 minutes 51 seconds       [facility]

## 2017-06-01 NOTE — H&P (Addendum)
Wesley Darby, MD [address]  [place]  [place],  [address]  Main: [phone]  Fax: [phone] Pager: [phone]  Primary Care Physician:  Roselee Nova, MD Primary Gastroenterologist:  Dr. Cephas Adams  Pre-Procedure History & Physical: HPI:  Wesley Adams is a 52 y.o. male is here for an EGD and colonoscopy.   Past Medical History:  Diagnosis Date  . Chronic back pain     Past Surgical History:  Procedure Laterality Date  . BACK SURGERY  Feb 2016   cut disc off of nerve with Dr Arnoldo Morale  . FRACTURE SURGERY Right as child   rod in right femur  . SPINAL CORD STIMULATOR INSERTION  03/23/2017   Dr Newman Pies, [facility]    Prior to Admission medications   Medication Sig Start Date End Date Taking? Authorizing Provider  fluticasone (FLONASE) 50 MCG/ACT nasal spray Place 2 sprays into both nostrils daily. 05/19/17  Yes Hubbard Hartshorn, FNP  HYDROcodone-acetaminophen (NORCO) 10-325 MG tablet Take 1 tablet by mouth every 6 (six) hours as needed.   Yes [provider]  omeprazole (PRILOSEC) 20 MG capsule Take 1 capsule (20 mg total) by mouth daily. 05/27/17  Yes Hubbard Hartshorn, FNP  ranitidine (ZANTAC) 150 MG tablet Take 1 tablet (150 mg total) by mouth daily. 05/27/17  Yes Hubbard Hartshorn, FNP  cyclobenzaprine (FLEXERIL) 10 MG tablet TAKE 1 TABLET BY ORAL ROUTE 3 TIMES EVERY DAY AS NEEDED FOR MUSCLE SPASMS 04/15/17   [provider]    Allergies as of 05/16/2017  . (No Known Allergies)    Family History  Problem Relation Age of Onset  . Alcohol abuse Mother   . Heart attack Father   . Heart disease Father   . Obesity Father     Social History   Socioeconomic History  . Marital status: Single    Spouse name: Not on file  . Number of children: Not on file  . Years of education: Not on file  . Highest education level: Not on file  Social Needs  . Financial resource strain: Not on file  . Food insecurity -  worry: Not on file  . Food insecurity - inability: Not on file  . Transportation needs - medical: Not on file  . Transportation needs - non-medical: Not on file  Occupational History  . Not on file  Tobacco Use  . Smoking status: Current Every Day Smoker    Packs/day: 1.00    Years: 25.00    Pack years: 25.00    Types: Cigarettes  . Smokeless tobacco: Never Used  . Tobacco comment: down to 1/2 PPD  Substance and Sexual Activity  . Alcohol use: Yes    Alcohol/week: 1.8 oz    Types: 3 Cans of beer per week    Comment: beer on weekend  . Drug use: No  . Sexual activity: Not on file  Other Topics Concern  . Not on file  Social History Narrative  . Not on file    Review of Systems: See HPI, otherwise negative ROS  Physical Exam: BP 120/78   Pulse 88   Temp 98.1 F (36.7 C) (Temporal)   Resp 16   Ht 6\' 1"  (1.854 m)   Wt 200 lb (90.7 kg)   SpO2 99%   BMI 26.39 kg/m  General:   Alert,  pleasant and cooperative in NAD Head:  Normocephalic and atraumatic. Neck:  Supple; no masses or thyromegaly. Lungs:  Clear throughout to auscultation.    Heart:  Regular rate and rhythm. Abdomen:  Soft, nontender and nondistended. Normal bowel sounds, without guarding, and without rebound.   Neurologic:  Alert and  oriented x4;  grossly normal neurologically.  Impression/Plan: Wesley Adams is here for an colonoscopy to be performed for epigastric pain and  screening for colon cancer/polyps  Risks, benefits, limitations, and alternatives regarding  colonoscopy have been reviewed with the patient.  Questions have been answered.  All parties agreeable.   Sherri Sear, MD  06/01/2017, 8:02 AM

## 2017-06-01 NOTE — Anesthesia Postprocedure Evaluation (Signed)
Anesthesia Post Note  Patient: Wesley Adams  Procedure(s) Performed: COLONOSCOPY WITH PROPOFOL (N/A ) ESOPHAGOGASTRODUODENOSCOPY (EGD) WITH PROPOFOL  Patient location during evaluation: PACU Anesthesia Type: General Level of consciousness: awake and alert Pain management: pain level controlled Vital Signs Assessment: post-procedure vital signs reviewed and stable Respiratory status: spontaneous breathing, nonlabored ventilation, respiratory function stable and patient connected to nasal cannula oxygen Cardiovascular status: blood pressure returned to baseline and stable Postop Assessment: no apparent nausea or vomiting Anesthetic complications: no    Alcides Nutting ELAINE

## 2017-06-01 NOTE — Op Note (Signed)
[facility] Gastroenterology Patient Name: Wesley Adams Procedure Date: 06/01/2017 8:15 AM MRN: [id number] Account #: [id number] Date of Birth: May 09, 1965 Admit Type: Outpatient Age: 52 Room: [facility] OR ROOM 01 Gender: Male Note Status: Finalized Procedure:            Colonoscopy Indications:          Screening for colorectal malignant neoplasm, This is                        the patient's first colonoscopy Providers:            Lin Landsman MD, MD Referring MD:         Otila Back. Manuella Ghazi (Referring MD) Medicines:            Monitored Anesthesia Care Complications:        No immediate complications. Estimated blood loss: None. Procedure:            Pre-Anesthesia Assessment:                       - Prior to the procedure, a History and Physical was                        performed, and patient medications and allergies were                        reviewed. The patient is competent. The risks and                        benefits of the procedure and the sedation options and                        risks were discussed with the patient. All questions                        were answered and informed consent was obtained.                        Patient identification and proposed procedure were                        verified by the physician, the nurse, the                        anesthesiologist, the anesthetist and the technician in                        the pre-procedure area in the procedure room. Mental                        Status Examination: alert and oriented. Airway                        Examination: normal oropharyngeal airway and neck                        mobility. Respiratory Examination: clear to                        auscultation. CV Examination: normal. Prophylactic  Antibiotics: The patient does not require prophylactic                        antibiotics. Prior Anticoagulants: The patient has                        taken no  previous anticoagulant or antiplatelet agents.                        ASA Grade Assessment: III - A patient with severe                        systemic disease. After reviewing the risks and                        benefits, the patient was deemed in satisfactory                        condition to undergo the procedure. The anesthesia plan                        was to use monitored anesthesia care (MAC). Immediately                        prior to administration of medications, the patient was                        re-assessed for adequacy to receive sedatives. The                        heart rate, respiratory rate, oxygen saturations, blood                        pressure, adequacy of pulmonary ventilation, and                        response to care were monitored throughout the                        procedure. The physical status of the patient was                        re-assessed after the procedure.                       After obtaining informed consent, the colonoscope was                        passed under direct vision. Throughout the procedure,                        the patient's blood pressure, pulse, and oxygen                        saturations were monitored continuously. The [place] [phone]) was introduced through the  anus and advanced to the the cecum, identified by                        appendiceal orifice and ileocecal valve. The                        colonoscopy was unusually difficult due to restricted                        mobility of the colon. Successful completion of the                        procedure was aided by withdrawing the scope and                        replacing with the pediatric endoscope. The patient                        tolerated the procedure well. The quality of the bowel                        preparation was evaluated using the BBPS [facility] Bowel                         Preparation Scale) with scores of: Right Colon = 3,                        Transverse Colon = 3 and Left Colon = 3 (entire mucosa                        seen well with no residual staining, small fragments of                        stool or opaque liquid). The total BBPS score equals 9. Findings:      The perianal and digital rectal examinations were normal. Pertinent       negatives include normal sphincter tone and no palpable rectal lesions.      A few small-mouthed diverticula were found in the ascending colon.      A 5 mm polyp was found in the sigmoid colon. The polyp was sessile. The       polyp was removed with a cold snare. Resection and retrieval were       complete.      A diminutive polyp was found in the sigmoid colon. The polyp was       sessile. The polyp was removed with a cold biopsy forceps. Resection and       retrieval were complete.      The retroflexed view of the distal rectum and anal verge was normal and       showed no anal or rectal abnormalities. Impression:           - Narrow rectosigmoid junction, pediatric colonscope                        was used                       - Diverticulosis in the ascending colon.                       -  One 5 mm polyp in the sigmoid colon, removed with a                        cold snare. Resected and retrieved.                       - One diminutive polyp in the sigmoid colon, removed                        with a cold biopsy forceps. Resected and retrieved.                       - The distal rectum and anal verge are normal on                        retroflexion view. Recommendation:       - Discharge patient to home.                       - Resume previous diet today.                       - Continue present medications.                       - Await pathology results.                       - Repeat colonoscopy in 5-10 years for surveillance                        based on pathology results.                       - Return  to my office in 2 weeks. Procedure Code(s):    --- Professional ---                       [phone], Colonoscopy, flexible; with removal of tumor(s),                        polyp(s), or other lesion(s) by snare technique                       [id number], 23, Colonoscopy, flexible; with biopsy, single                        or multiple Diagnosis Code(s):    --- Professional ---                       Z12.11, Encounter for screening for malignant neoplasm                        of colon                       D12.5, Benign neoplasm of sigmoid colon                       K57.30, Diverticulosis of large intestine without                        perforation  or abscess without bleeding CPT copyright 2016 American Medical Association. All rights reserved. The codes documented in this report are preliminary and upon coder review may  be revised to meet current compliance requirements. Dr. Ulyess Mort Lin Landsman MD, MD 06/01/2017 9:09:17 AM This report has been signed electronically. Number of Addenda: 0 Note Initiated On: 06/01/2017 8:15 AM Scope Withdrawal Time: 0 hours 12 minutes 4 seconds  Total Procedure Duration: 0 hours 22 minutes 2 seconds       [facility]

## 2017-06-01 NOTE — Anesthesia Preprocedure Evaluation (Signed)
Anesthesia Evaluation  Patient identified by MRN, date of birth, ID band Patient awake    Reviewed: Allergy & Precautions, H&P , NPO status , Patient's Chart, lab work & pertinent test results, reviewed documented beta blocker date and time   History of Anesthesia Complications Negative for: history of anesthetic complications  Airway Mallampati: II  TM Distance: >3 FB Neck ROM: full    Dental no notable dental hx.    Pulmonary Current Smoker,    Pulmonary exam normal breath sounds clear to auscultation       Cardiovascular Exercise Tolerance: Good negative cardio ROS   Rhythm:regular Rate:Normal     Neuro/Psych Back pain Spinal cord stimulator placed 02/2017 negative psych ROS   GI/Hepatic negative GI ROS, Neg liver ROS,   Endo/Other  negative endocrine ROS  Renal/GU negative Renal ROS  negative genitourinary   Musculoskeletal   Abdominal   Peds  Hematology negative hematology ROS (+)   Anesthesia Other Findings   Reproductive/Obstetrics negative OB ROS                             Anesthesia Physical Anesthesia Plan  ASA: II  Anesthesia Plan: General   Post-op Pain Management:    Induction:   PONV Risk Score and Plan:   Airway Management Planned:   Additional Equipment:   Intra-op Plan:   Post-operative Plan:   Informed Consent: I have reviewed the patients History and Physical, chart, labs and discussed the procedure including the risks, benefits and alternatives for the proposed anesthesia with the patient or authorized representative who has indicated his/her understanding and acceptance.   Dental Advisory Given  Plan Discussed with: CRNA  Anesthesia Plan Comments:         Anesthesia Quick Evaluation

## 2017-06-01 NOTE — Anesthesia Procedure Notes (Signed)
Date/Time: 06/01/2017 8:22 AM Performed by: Cameron Ali, CRNA Pre-anesthesia Checklist: Patient identified, Emergency Drugs available, Suction available, Timeout performed and Patient being monitored Patient Re-evaluated:Patient Re-evaluated prior to induction Oxygen Delivery Method: Nasal cannula Placement Confirmation: positive ETCO2

## 2017-06-01 NOTE — Transfer of Care (Signed)
Immediate Anesthesia Transfer of Care Note  Patient: Wesley Adams  Procedure(s) Performed: COLONOSCOPY WITH PROPOFOL (N/A ) ESOPHAGOGASTRODUODENOSCOPY (EGD) WITH PROPOFOL  Patient Location: PACU  Anesthesia Type: General  Level of Consciousness: awake, alert  and patient cooperative  Airway and Oxygen Therapy: Patient Spontanous Breathing and Patient connected to supplemental oxygen  Post-op Assessment: Post-op Vital signs reviewed, Patient's Cardiovascular Status Stable, Respiratory Function Stable, Patent Airway and No signs of Nausea or vomiting  Post-op Vital Signs: Reviewed and stable  Complications: No apparent anesthesia complications

## 2017-06-02 ENCOUNTER — Encounter: Payer: Self-pay | Admitting: Gastroenterology

## 2017-06-06 ENCOUNTER — Encounter: Payer: Self-pay | Admitting: Gastroenterology

## 2017-06-13 ENCOUNTER — Other Ambulatory Visit: Payer: Self-pay

## 2017-06-13 ENCOUNTER — Encounter (HOSPITAL_COMMUNITY): Payer: Self-pay

## 2017-06-13 ENCOUNTER — Encounter (HOSPITAL_COMMUNITY)
Admission: RE | Admit: 2017-06-13 | Discharge: 2017-06-13 | Disposition: A | Discharge status: Home / Self Care | Payer: BLUE CROSS/BLUE SHIELD | Source: Ambulatory Visit | Attending: Neurosurgery | Admitting: Neurosurgery

## 2017-06-13 DIAGNOSIS — Z79899 Other long term (current) drug therapy: Secondary | ICD-10-CM | POA: Diagnosis not present

## 2017-06-13 DIAGNOSIS — M545 Low back pain: Secondary | ICD-10-CM | POA: Diagnosis not present

## 2017-06-13 DIAGNOSIS — F17210 Nicotine dependence, cigarettes, uncomplicated: Secondary | ICD-10-CM | POA: Diagnosis not present

## 2017-06-13 DIAGNOSIS — Z981 Arthrodesis status: Secondary | ICD-10-CM | POA: Diagnosis not present

## 2017-06-13 DIAGNOSIS — G8929 Other chronic pain: Secondary | ICD-10-CM | POA: Diagnosis not present

## 2017-06-13 LAB — CBC
HEMATOCRIT: 42.7 % (ref 39.0–52.0)
Hemoglobin: 14.2 g/dL (ref 13.0–17.0)
MCH: 30.1 pg (ref 26.0–34.0)
MCHC: 33.3 g/dL (ref 30.0–36.0)
MCV: 90.5 fL (ref 78.0–100.0)
Platelets: 216 10*3/uL (ref 150–400)
RBC: 4.72 MIL/uL (ref 4.22–5.81)
RDW: 14.5 % (ref 11.5–15.5)
WBC: 8.5 10*3/uL (ref 4.0–10.5)

## 2017-06-13 LAB — SURGICAL PCR SCREEN
MRSA, PCR: NEGATIVE
Staphylococcus aureus: NEGATIVE

## 2017-06-13 NOTE — Progress Notes (Signed)
PCP - Dr. Brigitte Pulse Cardiologist - denies  Patient denies shortness of breath, fever, cough and chest pain at PAT appointment   Patient verbalized understanding of instructions that were given to them at the PAT appointment. Patient was also instructed that they will need to review over the PAT instructions again at home before surgery.

## 2017-06-13 NOTE — Pre-Procedure Instructions (Signed)
Wesley Adams  06/13/2017      [id number] - [place], [place] - [address] [place] [address] Phone: [phone] Fax: [phone]    Your procedure is scheduled on Thursday February 21.  Report to [facility] Admitting at 8:15 A.M.  Call this number if you have problems the morning of surgery:  [phone]   Remember:  Do not eat food or drink liquids after midnight.  Take these medicines the morning of surgery with A SIP OF WATER:   Omeprazole (prilosec)  Ranitidine (zantac) Hydrocodone-acetaminophen (Norco) if needed  7 days prior to surgery STOP taking any Aspirin(unless otherwise instructed by your surgeon), Aleve, Naproxen, Ibuprofen, Motrin, Advil, Goody's, BC's, all herbal medications, fish oil, and all vitamins    Do not wear jewelry, make-up or nail polish.  Do not wear lotions, powders, or perfumes, or deodorant.  Do not shave 48 hours prior to surgery.  Men may shave face and neck.  Do not bring valuables to the hospital.  [facility] is not responsible for any belongings or valuables.  Contacts, dentures or bridgework may not be worn into surgery.  Leave your suitcase in the car.  After surgery it may be brought to your room.  For patients admitted to the hospital, discharge time will be determined by your treatment team.  Patients discharged the day of surgery will not be allowed to drive home.    Special instructions:    [facility]- Preparing For Surgery  Before surgery, you can play an important role. Because skin is not sterile, your skin needs to be as free of germs as possible. You can reduce the number of germs on your skin by washing with CHG (chlorahexidine gluconate) Soap before surgery.  CHG is an antiseptic cleaner which kills germs and bonds with the skin to continue killing germs even after washing.  Please do not use if you have an allergy to CHG or antibacterial soaps. If your skin becomes  reddened/irritated stop using the CHG.  Do not shave (including legs and underarms) for at least 48 hours prior to first CHG shower. It is OK to shave your face.  Please follow these instructions carefully.   1. Shower the NIGHT BEFORE SURGERY and the MORNING OF SURGERY with CHG.   2. If you chose to wash your hair, wash your hair first as usual with your normal shampoo.  3. After you shampoo, rinse your hair and body thoroughly to remove the shampoo.  4. Use CHG as you would any other liquid soap. You can apply CHG directly to the skin and wash gently with a scrungie or a clean washcloth.   5. Apply the CHG Soap to your body ONLY FROM THE NECK DOWN.  Do not use on open wounds or open sores. Avoid contact with your eyes, ears, mouth and genitals (private parts). Wash Face and genitals (private parts)  with your normal soap.  6. Wash thoroughly, paying special attention to the area where your surgery will be performed.  7. Thoroughly rinse your body with warm water from the neck down.  8. DO NOT shower/wash with your normal soap after using and rinsing off the CHG Soap.  9. Pat yourself dry with a CLEAN TOWEL.  10. Wear CLEAN PAJAMAS to bed the night before surgery, wear comfortable clothes the morning of surgery  11. Place CLEAN SHEETS on your bed the night of your first shower and  DO NOT SLEEP WITH PETS.    Day of Surgery: Do not apply any deodorants/lotions. Please wear clean clothes to the hospital/surgery center.      Please read over the following fact sheets that you were given. Coughing and Deep Breathing and Surgical Site Infection Prevention

## 2017-06-15 ENCOUNTER — Other Ambulatory Visit: Payer: Self-pay | Admitting: Family Medicine

## 2017-06-15 ENCOUNTER — Ambulatory Visit: Payer: BLUE CROSS/BLUE SHIELD | Admitting: Gastroenterology

## 2017-06-15 ENCOUNTER — Encounter: Payer: Self-pay | Admitting: *Deleted

## 2017-06-15 DIAGNOSIS — J0110 Acute frontal sinusitis, unspecified: Secondary | ICD-10-CM

## 2017-06-16 ENCOUNTER — Ambulatory Visit (HOSPITAL_COMMUNITY): Payer: BLUE CROSS/BLUE SHIELD | Admitting: Certified Registered"

## 2017-06-16 ENCOUNTER — Encounter (HOSPITAL_COMMUNITY): Payer: Self-pay | Admitting: Urology

## 2017-06-16 ENCOUNTER — Ambulatory Visit (HOSPITAL_COMMUNITY)
Admission: RE | Admit: 2017-06-16 | Discharge: 2017-06-16 | Disposition: A | Discharge status: Home / Self Care | Payer: BLUE CROSS/BLUE SHIELD | Source: Ambulatory Visit | Attending: Neurosurgery | Admitting: Neurosurgery

## 2017-06-16 ENCOUNTER — Encounter (HOSPITAL_COMMUNITY)
Admission: RE | Disposition: A | Discharge status: Home / Self Care | Payer: Self-pay | Source: Ambulatory Visit | Attending: Neurosurgery

## 2017-06-16 DIAGNOSIS — Z79899 Other long term (current) drug therapy: Secondary | ICD-10-CM | POA: Insufficient documentation

## 2017-06-16 DIAGNOSIS — G8929 Other chronic pain: Secondary | ICD-10-CM | POA: Diagnosis not present

## 2017-06-16 DIAGNOSIS — F17210 Nicotine dependence, cigarettes, uncomplicated: Secondary | ICD-10-CM | POA: Insufficient documentation

## 2017-06-16 DIAGNOSIS — M545 Low back pain: Secondary | ICD-10-CM | POA: Insufficient documentation

## 2017-06-16 DIAGNOSIS — Z981 Arthrodesis status: Secondary | ICD-10-CM | POA: Insufficient documentation

## 2017-06-16 HISTORY — PX: SPINAL CORD STIMULATOR REMOVAL: SHX5379

## 2017-06-16 SURGERY — LUMBAR SPINAL CORD STIMULATOR REMOVAL
Anesthesia: General | Site: Spine Lumbar

## 2017-06-16 MED ORDER — SUCCINYLCHOLINE CHLORIDE 20 MG/ML IJ SOLN
INTRAMUSCULAR | Status: DC | PRN
  Administered 2017-06-16: 110 mg via INTRAVENOUS

## 2017-06-16 MED ORDER — CEFAZOLIN SODIUM-DEXTROSE 2-4 GM/100ML-% IV SOLN
2.0000 g | INTRAVENOUS | Status: AC
  Administered 2017-06-16: 2 g via INTRAVENOUS

## 2017-06-16 MED ORDER — FENTANYL CITRATE (PF) 100 MCG/2ML IJ SOLN: 25.0000 ug | INTRAMUSCULAR | Status: DC | PRN

## 2017-06-16 MED ORDER — 0.9 % SODIUM CHLORIDE (POUR BTL) OPTIME
TOPICAL | Status: DC | PRN
  Administered 2017-06-16: 1000 mL

## 2017-06-16 MED ORDER — LIDOCAINE HCL (CARDIAC) 20 MG/ML IV SOLN
INTRAVENOUS | Status: DC | PRN
  Administered 2017-06-16: 100 mg via INTRAVENOUS

## 2017-06-16 MED ORDER — PROPOFOL 10 MG/ML IV BOLUS
INTRAVENOUS | Status: AC
  Filled 2017-06-16: qty 20

## 2017-06-16 MED ORDER — SUGAMMADEX SODIUM 200 MG/2ML IV SOLN
INTRAVENOUS | Status: DC | PRN
  Administered 2017-06-16: 200 mg via INTRAVENOUS

## 2017-06-16 MED ORDER — LACTATED RINGERS IV SOLN
INTRAVENOUS | Status: DC | PRN
  Administered 2017-06-16 (×2): via INTRAVENOUS

## 2017-06-16 MED ORDER — THROMBIN 5000 UNITS EX SOLR
CUTANEOUS | Status: AC
  Filled 2017-06-16: qty 10000

## 2017-06-16 MED ORDER — CHLORHEXIDINE GLUCONATE CLOTH 2 % EX PADS: 6.0000 | MEDICATED_PAD | Freq: Once | CUTANEOUS | Status: DC

## 2017-06-16 MED ORDER — BACITRACIN ZINC 500 UNIT/GM EX OINT
TOPICAL_OINTMENT | CUTANEOUS | Status: DC | PRN
  Administered 2017-06-16: 1 via TOPICAL

## 2017-06-16 MED ORDER — MIDAZOLAM HCL 5 MG/5ML IJ SOLN
INTRAMUSCULAR | Status: DC | PRN
  Administered 2017-06-16: 2 mg via INTRAVENOUS

## 2017-06-16 MED ORDER — BACITRACIN ZINC 500 UNIT/GM EX OINT
TOPICAL_OINTMENT | CUTANEOUS | Status: AC
  Filled 2017-06-16: qty 28.35

## 2017-06-16 MED ORDER — SODIUM CHLORIDE 0.9 % IR SOLN
Status: DC | PRN
  Administered 2017-06-16: 500 mL

## 2017-06-16 MED ORDER — VANCOMYCIN HCL 1000 MG IV SOLR
INTRAVENOUS | Status: AC
  Filled 2017-06-16: qty 1000

## 2017-06-16 MED ORDER — BUPIVACAINE-EPINEPHRINE (PF) 0.5% -1:200000 IJ SOLN
INTRAMUSCULAR | Status: AC
  Filled 2017-06-16: qty 30

## 2017-06-16 MED ORDER — PROPOFOL 10 MG/ML IV BOLUS
INTRAVENOUS | Status: DC | PRN
  Administered 2017-06-16: 180 mg via INTRAVENOUS

## 2017-06-16 MED ORDER — PHENYLEPHRINE 40 MCG/ML (10ML) SYRINGE FOR IV PUSH (FOR BLOOD PRESSURE SUPPORT)
PREFILLED_SYRINGE | INTRAVENOUS | Status: AC
  Filled 2017-06-16: qty 10

## 2017-06-16 MED ORDER — BUPIVACAINE-EPINEPHRINE (PF) 0.5% -1:200000 IJ SOLN
INTRAMUSCULAR | Status: DC | PRN
  Administered 2017-06-16: 6 mL

## 2017-06-16 MED ORDER — DEXAMETHASONE SODIUM PHOSPHATE 10 MG/ML IJ SOLN
INTRAMUSCULAR | Status: DC | PRN
  Administered 2017-06-16: 10 mg via INTRAVENOUS

## 2017-06-16 MED ORDER — MIDAZOLAM HCL 2 MG/2ML IJ SOLN
INTRAMUSCULAR | Status: AC
  Filled 2017-06-16: qty 2

## 2017-06-16 MED ORDER — BUPIVACAINE HCL (PF) 0.5 % IJ SOLN
INTRAMUSCULAR | Status: AC
  Filled 2017-06-16: qty 30

## 2017-06-16 MED ORDER — ROCURONIUM BROMIDE 100 MG/10ML IV SOLN
INTRAVENOUS | Status: DC | PRN
  Administered 2017-06-16: 40 mg via INTRAVENOUS

## 2017-06-16 MED ORDER — CEFAZOLIN SODIUM-DEXTROSE 2-4 GM/100ML-% IV SOLN
INTRAVENOUS | Status: AC
Start: 2017-06-16 — End: 2017-06-16
  Filled 2017-06-16: qty 100

## 2017-06-16 MED ORDER — FENTANYL CITRATE (PF) 100 MCG/2ML IJ SOLN
INTRAMUSCULAR | Status: DC | PRN
  Administered 2017-06-16 (×2): 50 ug via INTRAVENOUS

## 2017-06-16 MED ORDER — ONDANSETRON HCL 4 MG/2ML IJ SOLN
INTRAMUSCULAR | Status: DC | PRN
  Administered 2017-06-16: 4 mg via INTRAVENOUS

## 2017-06-16 MED ORDER — FENTANYL CITRATE (PF) 250 MCG/5ML IJ SOLN
INTRAMUSCULAR | Status: AC
  Filled 2017-06-16: qty 5

## 2017-06-16 MED ORDER — LACTATED RINGERS IV SOLN
INTRAVENOUS | Status: DC
  Administered 2017-06-16: 07:00:00 via INTRAVENOUS

## 2017-06-16 MED ORDER — PHENYLEPHRINE HCL 10 MG/ML IJ SOLN
INTRAMUSCULAR | Status: DC | PRN
  Administered 2017-06-16 (×3): 80 ug via INTRAVENOUS

## 2017-06-16 SURGICAL SUPPLY — 55 items
BAG DECANTER FOR FLEXI CONT (MISCELLANEOUS) ×2 IMPLANT
BENZOIN TINCTURE PRP APPL 2/3 (GAUZE/BANDAGES/DRESSINGS) ×2 IMPLANT
BLADE CLIPPER SURG (BLADE) IMPLANT
BUR MATCHSTICK NEURO 3.0 LAGG (BURR) IMPLANT
BUR PRECISION FLUTE 6.0 (BURR) IMPLANT
CARTRIDGE OIL MAESTRO DRILL (MISCELLANEOUS) IMPLANT
CLSR STERI-STRIP ANTIMIC 1/2X4 (GAUZE/BANDAGES/DRESSINGS) ×2 IMPLANT
DERMABOND ADVANCED (GAUZE/BANDAGES/DRESSINGS) ×1
DERMABOND ADVANCED .7 DNX12 (GAUZE/BANDAGES/DRESSINGS) ×1 IMPLANT
DIFFUSER DRILL AIR PNEUMATIC (MISCELLANEOUS) IMPLANT
DRAPE LAPAROTOMY 100X72X124 (DRAPES) ×2 IMPLANT
DRAPE SURG 17X23 STRL (DRAPES) ×8 IMPLANT
DRSG OPSITE POSTOP 3X4 (GAUZE/BANDAGES/DRESSINGS) ×2 IMPLANT
DRSG OPSITE POSTOP 4X6 (GAUZE/BANDAGES/DRESSINGS) ×2 IMPLANT
ELECT REM PT RETURN 9FT ADLT (ELECTROSURGICAL) ×2
ELECTRODE REM PT RTRN 9FT ADLT (ELECTROSURGICAL) ×1 IMPLANT
GAUZE SPONGE 4X4 12PLY STRL LF (GAUZE/BANDAGES/DRESSINGS) ×2 IMPLANT
GAUZE SPONGE 4X4 16PLY XRAY LF (GAUZE/BANDAGES/DRESSINGS) IMPLANT
GLOVE BIO SURGEON STRL SZ8 (GLOVE) ×4 IMPLANT
GLOVE BIO SURGEON STRL SZ8.5 (GLOVE) ×4 IMPLANT
GLOVE BIOGEL PI IND STRL 6.5 (GLOVE) ×2 IMPLANT
GLOVE BIOGEL PI IND STRL 7.5 (GLOVE) ×3 IMPLANT
GLOVE BIOGEL PI INDICATOR 6.5 (GLOVE) ×2
GLOVE BIOGEL PI INDICATOR 7.5 (GLOVE) ×3
GLOVE EXAM NITRILE LRG STRL (GLOVE) IMPLANT
GLOVE EXAM NITRILE XL STR (GLOVE) IMPLANT
GLOVE EXAM NITRILE XS STR PU (GLOVE) IMPLANT
GLOVE SURG SS PI 6.0 STRL IVOR (GLOVE) ×8 IMPLANT
GLOVE SURG SS PI 7.0 STRL IVOR (GLOVE) ×6 IMPLANT
GOWN STRL REUS W/ TWL LRG LVL3 (GOWN DISPOSABLE) ×1 IMPLANT
GOWN STRL REUS W/ TWL XL LVL3 (GOWN DISPOSABLE) ×3 IMPLANT
GOWN STRL REUS W/TWL 2XL LVL3 (GOWN DISPOSABLE) IMPLANT
GOWN STRL REUS W/TWL LRG LVL3 (GOWN DISPOSABLE) ×1
GOWN STRL REUS W/TWL XL LVL3 (GOWN DISPOSABLE) ×3
KIT BASIN OR (CUSTOM PROCEDURE TRAY) ×2 IMPLANT
KIT ROOM TURNOVER OR (KITS) ×2 IMPLANT
NEEDLE HYPO 22GX1.5 SAFETY (NEEDLE) ×2 IMPLANT
NS IRRIG 1000ML POUR BTL (IV SOLUTION) ×2 IMPLANT
OIL CARTRIDGE MAESTRO DRILL (MISCELLANEOUS)
PACK LAMINECTOMY NEURO (CUSTOM PROCEDURE TRAY) ×2 IMPLANT
PAD ARMBOARD 7.5X6 YLW CONV (MISCELLANEOUS) ×2 IMPLANT
PATTIES SURGICAL .5 X1 (DISPOSABLE) IMPLANT
SPONGE LAP 4X18 X RAY DECT (DISPOSABLE) IMPLANT
SPONGE SURGIFOAM ABS GEL SZ50 (HEMOSTASIS) IMPLANT
STAPLER SKIN PROX WIDE 3.9 (STAPLE) ×2 IMPLANT
STRIP CLOSURE SKIN 1/2X4 (GAUZE/BANDAGES/DRESSINGS) ×2 IMPLANT
SUT SILK 0 TIES 10X30 (SUTURE) IMPLANT
SUT SILK 2 0 PERMA HAND 18 BK (SUTURE) ×4 IMPLANT
SUT SILK 2 0 TIES 10X30 (SUTURE) IMPLANT
SUT VIC AB 1 CT1 18XBRD ANBCTR (SUTURE) ×1 IMPLANT
SUT VIC AB 1 CT1 8-18 (SUTURE) ×1
SUT VIC AB 2-0 CP2 18 (SUTURE) ×4 IMPLANT
TOWEL GREEN STERILE (TOWEL DISPOSABLE) ×2 IMPLANT
TOWEL GREEN STERILE FF (TOWEL DISPOSABLE) ×2 IMPLANT
WATER STERILE IRR 1000ML POUR (IV SOLUTION) ×2 IMPLANT

## 2017-06-16 NOTE — Anesthesia Postprocedure Evaluation (Signed)
Anesthesia Post Note  Patient: Wesley Adams  Procedure(s) Performed: SPINAL CORD STIMULATOR REMOVAL (N/A Spine Lumbar)     Patient location during evaluation: PACU Anesthesia Type: General Level of consciousness: awake Pain management: pain level controlled Vital Signs Assessment: post-procedure vital signs reviewed and stable Respiratory status: spontaneous breathing Cardiovascular status: stable Anesthetic complications: no    Last Vitals:  Vitals:   06/16/17 1245 06/16/17 1306  BP: (!) 144/74 (!) 165/98  Pulse: (!) 57 72  Resp: 14 15  Temp: (!) 36.4 C (!) 36.3 C  SpO2: 99% 100%    Last Pain:  Vitals:   06/16/17 1245  TempSrc:   PainSc: 0-No pain                 Shamond Skelton

## 2017-06-16 NOTE — OR Nursing (Signed)
Spinal cord stimulator explanted and wasted

## 2017-06-16 NOTE — Transfer of Care (Signed)
Immediate Anesthesia Transfer of Care Note  Patient: Wesley Adams  Procedure(s) Performed: SPINAL CORD STIMULATOR REMOVAL (N/A Spine Lumbar)  Patient Location: PACU  Anesthesia Type:General  Level of Consciousness: awake, drowsy and patient cooperative  Airway & Oxygen Therapy: Patient Spontanous Breathing and Patient connected to nasal cannula oxygen  Post-op Assessment: Report given to RN, Post -op Vital signs reviewed and stable and Patient moving all extremities X 4  Post vital signs: Reviewed and stable  Last Vitals:  Vitals:   06/16/17 0723 06/16/17 1200  BP: (!) 152/79 (P) 139/76  Pulse: 72   Resp: 20   Temp: 36.4 C (!) (P) 36.4 C  SpO2: 99%     Last Pain:  Vitals:   06/16/17 0723  TempSrc: Oral  PainSc: 7          Complications: No apparent anesthesia complications

## 2017-06-16 NOTE — Anesthesia Procedure Notes (Signed)
Procedure Name: Intubation Date/Time: 06/16/2017 10:42 AM Performed by: Gaylene Brooks, CRNA Pre-anesthesia Checklist: Patient identified, Emergency Drugs available, Suction available and Patient being monitored Patient Re-evaluated:Patient Re-evaluated prior to induction Oxygen Delivery Method: Circle System Utilized Preoxygenation: Pre-oxygenation with 100% oxygen Induction Type: IV induction Ventilation: Mask ventilation without difficulty Laryngoscope Size: Miller and 2 Grade View: Grade II Tube type: Oral Tube size: 7.5 mm Number of attempts: 1 Airway Equipment and Method: Stylet Placement Confirmation: ETT inserted through vocal cords under direct vision,  positive ETCO2 and breath sounds checked- equal and bilateral Secured at: 23 cm Tube secured with: Tape Dental Injury: Teeth and Oropharynx as per pre-operative assessment

## 2017-06-16 NOTE — H&P (Signed)
Subjective: The patient is a 52 year old black male who has had chronic back pain.  He has had a lumbar discectomy and an L4-5 fusion.  Most recently I placed a spinal cord stimulator on 03/23/2017.  The patient complains of worsening back and abdominal pain since placement of the stimulator.  We discussed the various treatment options.  He has decided to have it removed.  Past Medical History:  Diagnosis Date  . Chronic back pain     Past Surgical History:  Procedure Laterality Date  . BACK SURGERY  Feb 2016   cut disc off of nerve with Dr Arnoldo Morale  . COLONOSCOPY WITH PROPOFOL N/A 06/01/2017   Procedure: COLONOSCOPY WITH PROPOFOL;  Surgeon: Lin Landsman, MD;  Location: [place];  Service: Endoscopy;  Laterality: N/A;  . ESOPHAGOGASTRODUODENOSCOPY (EGD) WITH PROPOFOL  06/01/2017   Procedure: ESOPHAGOGASTRODUODENOSCOPY (EGD) WITH PROPOFOL;  Surgeon: Lin Landsman, MD;  Location: [place];  Service: Endoscopy;;  . FRACTURE SURGERY Right as child   rod in right femur  . POLYPECTOMY  06/01/2017   Procedure: POLYPECTOMY;  Surgeon: Lin Landsman, MD;  Location: [place];  Service: Endoscopy;;  . SPINAL CORD STIMULATOR INSERTION  03/23/2017   Dr Newman Pies, [facility]    No Known Allergies  Social History   Tobacco Use  . Smoking status: Current Every Day Smoker    Packs/day: 1.00    Years: 25.00    Pack years: 25.00    Types: Cigarettes  . Smokeless tobacco: Never Used  . Tobacco comment: down to 1/2 PPD  Substance Use Topics  . Alcohol use: Yes    Alcohol/week: 1.8 oz    Types: 3 Cans of beer per week    Comment: beer on weekend    Family History  Problem Relation Age of Onset  . Alcohol abuse Mother   . Heart attack Father   . Heart disease Father   . Obesity Father    Prior to Admission medications   Medication Sig Start Date End Date Taking? Authorizing Provider  HYDROcodone-acetaminophen (NORCO) 10-325 MG  tablet Take 1 tablet by mouth every 6 (six) hours as needed for moderate pain.    Yes [provider]  omeprazole (PRILOSEC) 40 MG capsule Take 1 capsule (40 mg total) by mouth 2 (two) times daily before a meal. 06/01/17 08/30/17  Vanga, Tally Due, MD  ranitidine (ZANTAC) 150 MG tablet Take 1 tablet (150 mg total) by mouth daily. 05/27/17   Hubbard Hartshorn, FNP     Review of Systems  Positive ROS: As above  All other systems have been reviewed and were otherwise negative with the exception of those mentioned in the HPI and as above.  Objective: Vital signs in last 24 hours: Temp:  [97.6 F (36.4 C)] 97.6 F (36.4 C) (02/21 0723) Pulse Rate:  [72] 72 (02/21 0723) Resp:  [20] 20 (02/21 0723) BP: (152)/(79) 152/79 (02/21 0723) SpO2:  [99 %] 99 % (02/21 0723) Estimated body mass index is 27.19 kg/m as calculated from the following:   Height as of 06/13/17: 6\' 1"  (1.854 m).   Weight as of 06/13/17: 93.5 kg (206 lb 1.6 oz).   General Appearance: Alert Head: Normocephalic, without obvious abnormality, atraumatic Eyes: PERRL, conjunctiva/corneas clear, EOM's intact,    Ears: Normal  Throat: Normal  Neck: Supple, Back: unremarkable.  The patient's lumbar incisions are well-healed. Lungs: Clear to auscultation bilaterally, respirations unlabored Heart: Regular rate and rhythm, no murmur, rub  or gallop Abdomen: Soft, non-tender Extremities: Extremities normal, atraumatic, no cyanosis or edema Skin: unremarkable  NEUROLOGIC:   Mental status: alert and oriented,Motor Exam - grossly normal Sensory Exam - grossly normal Reflexes:  Coordination - grossly normal Gait - grossly normal Balance - grossly normal Cranial Nerves: I: smell Not tested  II: visual acuity  OS: Normal  OD: Normal   II: visual fields Full to confrontation  II: pupils Equal, round, reactive to light  III,VII: ptosis None  III,IV,VI: extraocular muscles  Full ROM  V: mastication Normal  V: facial light  touch sensation  Normal  V,VII: corneal reflex  Present  VII: facial muscle function - upper  Normal  VII: facial muscle function - lower Normal  VIII: hearing Not tested  IX: soft palate elevation  Normal  IX,X: gag reflex Present  XI: trapezius strength  5/5  XI: sternocleidomastoid strength 5/5  XI: neck flexion strength  5/5  XII: tongue strength  Normal    Data Review Lab Results  Component Value Date   WBC 8.5 06/13/2017   HGB 14.2 06/13/2017   HCT 42.7 06/13/2017   MCV 90.5 06/13/2017   PLT 216 06/13/2017   Lab Results  Component Value Date   NA 141 04/29/2017   K 3.9 04/29/2017   CL 107 04/29/2017   CO2 27 04/29/2017   BUN 12 04/29/2017   CREATININE 0.89 04/29/2017   GLUCOSE 109 (H) 04/29/2017   No results found for: INR, PROTIME  Assessment/Plan: Failed back syndrome, chronic back pain: I have discussed the situation with the patient.  We have discussed the various treatment options including removal of the stimulator.  I described as surgery to him.  We have discussed the risks, benefits, alternatives, expected postoperative course, and likelihood of achieving our goals with surgery.  I have answered all his questions.  He has decided to proceed with removal of a spinal cord stimulator.   Ophelia Charter 06/16/2017 10:30 AM

## 2017-06-16 NOTE — Op Note (Signed)
Brief history: The patient is a 52 year old black male whose had chronic back pain.  He has had a previous lumbar discectomy and lumbar fusion.  Most recently he had a placement of a spinal cord stimulator in November 2018.  He has complained of persistent back pain and abdominal pain.  We discussed the various treatment options.  He has requested removal of the stimulator after weighing the risks, benefits, and alternatives.  Preop diagnosis: Failed back syndrome, chronic low back pain  Postop diagnosis: The same  Procedure: Removal of spinal cord stimulator  Surgeon: Dr. Earle Gell  Assistant: None  Anesthesia: General tracheal  Estimated blood loss: Minimal  Specimens: None  Complications: None  Drains: None  Description of procedure: The patient was brought to the operating room by the anesthesia team.  General endotracheal anesthesia was induced.  The patient was turned to the prone position on the Wilson frame.  His thoracolumbar region was then prepared with Betadine scrub and Betadine solution.  Sterile drapes were applied.  I then injected the area to be incised with Marcaine with epinephrine solution.  Patient scalpel to make an incision through the previous surgical scars at his thoracic region in the midline and over his left flank.  I used electrocautery to expose the spinal cord stimulator as well as the leads.  There was no evidence of infection.  I ligated the suture at the thoracic interspinous ligament which was holding the leads in place.  I then removed the leads remove the stimulator.  We then irrigated the wound out with bacitracin solution.  I removed the retractors.  I reapproximated patient's thoracic fascia with interrupted 0 Vicryl suture.  I reapproximated the subcutaneous tissue with interrupted 2-0 Vicryl suture.  I then reapproximated the skin with Steri-Strips and benzoin.  A sterile dressing was applied.  The drapes were removed.  By report all sponge  instrument and needle counts were correct at the end this case.

## 2017-06-16 NOTE — Anesthesia Preprocedure Evaluation (Signed)
Anesthesia Evaluation  Patient identified by MRN, date of birth, ID band Patient awake    Reviewed: Allergy & Precautions, NPO status , Patient's Chart, lab work & pertinent test results  Airway Mallampati: II  TM Distance: >3 FB     Dental   Pulmonary neg pulmonary ROS, Current Smoker,    breath sounds clear to auscultation       Cardiovascular negative cardio ROS   Rhythm:Regular Rate:Normal     Neuro/Psych    GI/Hepatic negative GI ROS, Neg liver ROS,   Endo/Other  negative endocrine ROS  Renal/GU negative Renal ROS     Musculoskeletal  (+) Arthritis ,   Abdominal   Peds  Hematology   Anesthesia Other Findings   Reproductive/Obstetrics                             Anesthesia Physical Anesthesia Plan  ASA: III  Anesthesia Plan: General   Post-op Pain Management:    Induction: Intravenous  PONV Risk Score and Plan: 1 and Treatment may vary due to age or medical condition  Airway Management Planned: Oral ETT  Additional Equipment:   Intra-op Plan:   Post-operative Plan: Possible Post-op intubation/ventilation  Informed Consent: I have reviewed the patients History and Physical, chart, labs and discussed the procedure including the risks, benefits and alternatives for the proposed anesthesia with the patient or authorized representative who has indicated his/her understanding and acceptance.   Dental advisory given  Plan Discussed with: CRNA and Anesthesiologist  Anesthesia Plan Comments:         Anesthesia Quick Evaluation

## 2017-06-16 NOTE — Discharge Summary (Signed)
Physician Discharge Summary  Patient ID: Wesley Adams MRN: [id number] DOB/AGE: 1966-02-25 52 y.o.  Admit date: 06/16/2017 Discharge date: 06/16/2017  Admission Diagnoses: Failed back syndrome, chronic back pain  Discharge Diagnoses: The same Active Problems:   * No active hospital problems. *   Discharged Condition: good  Hospital Course: I removed the patient's spinal cord stimulator on 06/16/2017.  The patient's postoperative course was unremarkable.  He was discharged home.  He was given written and oral discharge instructions.  All his questions were answered.  Consults: None Significant Diagnostic Studies: None Treatments: Removal of spinal cord stimulator   discharge Exam: Blood pressure (!) 152/79, pulse 72, temperature 97.6 F (36.4 C), temperature source Oral, resp. rate 20, SpO2 99 %.  The patient is alert and pleasant.  He is moving his lower extremities well.  Disposition: Home  Discharge Instructions     Remove dressing in 72 hours   Complete by:  As directed     Remove dressing in 72 hours   Complete by:  As directed    Call MD for:  difficulty breathing, headache or visual disturbances   Complete by:  As directed    Call MD for:  difficulty breathing, headache or visual disturbances   Complete by:  As directed    Call MD for:  extreme fatigue   Complete by:  As directed    Call MD for:  extreme fatigue   Complete by:  As directed    Call MD for:  hives   Complete by:  As directed    Call MD for:  hives   Complete by:  As directed    Call MD for:  persistant dizziness or light-headedness   Complete by:  As directed    Call MD for:  persistant dizziness or light-headedness   Complete by:  As directed    Call MD for:  persistant nausea and vomiting   Complete by:  As directed    Call MD for:  persistant nausea and vomiting   Complete by:  As directed    Call MD for:  redness, tenderness, or signs of infection (pain, swelling, redness, odor or  green/yellow discharge around incision site)   Complete by:  As directed    Call MD for:  redness, tenderness, or signs of infection (pain, swelling, redness, odor or green/yellow discharge around incision site)   Complete by:  As directed    Call MD for:  severe uncontrolled pain   Complete by:  As directed    Call MD for:  severe uncontrolled pain   Complete by:  As directed    Call MD for:  temperature >100.4   Complete by:  As directed    Call MD for:  temperature >100.4   Complete by:  As directed    Diet - low sodium heart healthy   Complete by:  As directed    Diet - low sodium heart healthy   Complete by:  As directed    Discharge instructions   Complete by:  As directed    Call [phone] for a followup appointment. Take a stool softener while you are using pain medications.   Discharge instructions   Complete by:  As directed    Call [phone] for a followup appointment. Take a stool softener while you are using pain medications.   Driving Restrictions   Complete by:  As directed    Do not drive for 2 weeks.   Driving Restrictions  Complete by:  As directed    Do not drive for 2 weeks.   Increase activity slowly   Complete by:  As directed    Increase activity slowly   Complete by:  As directed    Lifting restrictions   Complete by:  As directed    Do not lift more than 5 pounds. No excessive bending or twisting.   Lifting restrictions   Complete by:  As directed    Do not lift more than 5 pounds. No excessive bending or twisting.   May shower / Bathe   Complete by:  As directed    Remove dressing 3 days after surgery.. Leave the incision alone.  You may shower.   May shower / Bathe   Complete by:  As directed    He may shower. Leave the incision alone.     Allergies as of 06/16/2017   No Known Allergies     Medication List    TAKE these medications   HYDROcodone-acetaminophen 10-325 MG tablet Commonly known as:  NORCO Take 1 tablet by mouth every  6 (six) hours as needed for moderate pain.   omeprazole 40 MG capsule Commonly known as:  PRILOSEC Take 1 capsule (40 mg total) by mouth 2 (two) times daily before a meal.   ranitidine 150 MG tablet Commonly known as:  ZANTAC Take 1 tablet (150 mg total) by mouth daily.        Signed: Ophelia Charter 06/16/2017, 11:57 AM

## 2017-06-17 ENCOUNTER — Encounter (HOSPITAL_COMMUNITY): Payer: Self-pay | Admitting: Neurosurgery

## 2017-06-29 ENCOUNTER — Other Ambulatory Visit: Payer: Self-pay | Admitting: Family Medicine

## 2017-06-29 DIAGNOSIS — J0110 Acute frontal sinusitis, unspecified: Secondary | ICD-10-CM

## 2017-07-07 ENCOUNTER — Other Ambulatory Visit: Payer: Self-pay | Admitting: Family Medicine

## 2017-07-07 DIAGNOSIS — J0110 Acute frontal sinusitis, unspecified: Secondary | ICD-10-CM

## 2017-07-07 NOTE — Telephone Encounter (Signed)
Flonase not on active medication list.

## 2017-07-18 ENCOUNTER — Telehealth: Payer: Self-pay | Admitting: Emergency Medicine

## 2017-07-18 NOTE — Telephone Encounter (Signed)
Referral has been resent for review.

## 2017-07-18 NOTE — Telephone Encounter (Signed)
Referral was placed at last visit for pain management.  Could you please look into the status of the referral? Thanks!

## 2017-07-18 NOTE — Telephone Encounter (Signed)
At previous appointment you stated he could get a referral to Pain Management. Please put in order

## 2017-08-02 ENCOUNTER — Other Ambulatory Visit: Payer: Self-pay

## 2017-08-02 ENCOUNTER — Emergency Department
Admission: EM | Admit: 2017-08-02 | Discharge: 2017-08-02 | Disposition: A | Discharge status: Home / Self Care | Payer: BLUE CROSS/BLUE SHIELD | Attending: Student in an Organized Health Care Education/Training Program | Admitting: Student in an Organized Health Care Education/Training Program

## 2017-08-02 DIAGNOSIS — F17210 Nicotine dependence, cigarettes, uncomplicated: Secondary | ICD-10-CM | POA: Diagnosis not present

## 2017-08-02 DIAGNOSIS — R112 Nausea with vomiting, unspecified: Secondary | ICD-10-CM | POA: Insufficient documentation

## 2017-08-02 DIAGNOSIS — Z79899 Other long term (current) drug therapy: Secondary | ICD-10-CM | POA: Diagnosis not present

## 2017-08-02 DIAGNOSIS — R197 Diarrhea, unspecified: Secondary | ICD-10-CM | POA: Insufficient documentation

## 2017-08-02 LAB — CBC
HCT: 50.5 % (ref 40.0–52.0)
Hemoglobin: 16.7 g/dL (ref 13.0–18.0)
MCH: 30.1 pg (ref 26.0–34.0)
MCHC: 33.1 g/dL (ref 32.0–36.0)
MCV: 90.9 fL (ref 80.0–100.0)
PLATELETS: 189 10*3/uL (ref 150–440)
RBC: 5.56 MIL/uL (ref 4.40–5.90)
RDW: 14.6 % — AB (ref 11.5–14.5)
WBC: 10.4 10*3/uL (ref 3.8–10.6)

## 2017-08-02 LAB — COMPREHENSIVE METABOLIC PANEL
ALT: 23 U/L (ref 17–63)
AST: 27 U/L (ref 15–41)
Albumin: 4.6 g/dL (ref 3.5–5.0)
Alkaline Phosphatase: 75 U/L (ref 38–126)
Anion gap: 8 (ref 5–15)
BUN: 11 mg/dL (ref 6–20)
CHLORIDE: 106 mmol/L (ref 101–111)
CO2: 23 mmol/L (ref 22–32)
CREATININE: 0.88 mg/dL (ref 0.61–1.24)
Calcium: 9.4 mg/dL (ref 8.9–10.3)
GFR calc Af Amer: >60 mL/min (ref >60)
GFR calc non Af Amer: >60 mL/min (ref >60)
Glucose, Bld: 108 mg/dL — ABNORMAL HIGH (ref 65–99)
Potassium: 3.9 mmol/L (ref 3.5–5.1)
SODIUM: 137 mmol/L (ref 135–145)
Total Bilirubin: 0.8 mg/dL (ref 0.3–1.2)
Total Protein: 8.2 g/dL — ABNORMAL HIGH (ref 6.5–8.1)

## 2017-08-02 LAB — URINALYSIS, COMPLETE (UACMP) WITH MICROSCOPIC
Bacteria, UA: NONE SEEN
Bilirubin Urine: NEGATIVE
Glucose, UA: NEGATIVE mg/dL
KETONES UR: NEGATIVE mg/dL
Leukocytes, UA: NEGATIVE
Nitrite: NEGATIVE
PH: 5 (ref 5.0–8.0)
Protein, ur: NEGATIVE mg/dL
SPECIFIC GRAVITY, URINE: 1.023 (ref 1.005–1.030)

## 2017-08-02 LAB — LIPASE, BLOOD: LIPASE: 28 U/L (ref 11–51)

## 2017-08-02 MED ORDER — ONDANSETRON 4 MG PO TBDP
4.0000 mg | ORAL_TABLET | Freq: Once | ORAL | Status: AC | PRN
  Administered 2017-08-02: 4 mg via ORAL
  Filled 2017-08-02: qty 1

## 2017-08-02 MED ORDER — ONDANSETRON 4 MG PO TBDP: 4.0000 mg | ORAL_TABLET | Freq: Three times a day (TID) | ORAL | 0 refills | Status: DC | PRN

## 2017-08-02 NOTE — ED Triage Notes (Signed)
Pt c/o N/V/D since this morning.Wesley KitchenMarland Adams

## 2017-08-02 NOTE — Discharge Instructions (Addendum)

## 2017-08-02 NOTE — ED Provider Notes (Signed)
[facility] Emergency Department Provider Note    First MD Initiated Contact with Patient 08/02/17 1742     (approximate)  I have reviewed the triage vital signs and the nursing notes.   HISTORY  Chief Complaint Diarrhea and Emesis    HPI Wesley Adams is a 52 y.o. male since her chief complaint of nausea vomiting nonbloody diarrhea since this morning.  States that his family were sick with similar symptoms 2 days ago.  Was unable to work today.  States that any fluids or anything he eats runs right through him.  Denies any significant pain.  Was given Zofran in triage with improvement in symptoms.  No chest pain or shortness of breath.  Past Medical History:  Diagnosis Date  . Chronic back pain    Family History  Problem Relation Age of Onset  . Alcohol abuse Mother   . Heart attack Father   . Heart disease Father   . Obesity Father    Past Surgical History:  Procedure Laterality Date  . BACK SURGERY  Feb 2016   cut disc off of nerve with Dr Arnoldo Morale  . COLONOSCOPY WITH PROPOFOL N/A 06/01/2017   Procedure: COLONOSCOPY WITH PROPOFOL;  Surgeon: Lin Landsman, MD;  Location: [place];  Service: Endoscopy;  Laterality: N/A;  . ESOPHAGOGASTRODUODENOSCOPY (EGD) WITH PROPOFOL  06/01/2017   Procedure: ESOPHAGOGASTRODUODENOSCOPY (EGD) WITH PROPOFOL;  Surgeon: Lin Landsman, MD;  Location: [place];  Service: Endoscopy;;  . FRACTURE SURGERY Right as child   rod in right femur  . POLYPECTOMY  06/01/2017   Procedure: POLYPECTOMY;  Surgeon: Lin Landsman, MD;  Location: [place];  Service: Endoscopy;;  . SPINAL CORD STIMULATOR INSERTION  03/23/2017   Dr Newman Pies, [facility] Neurosurgery  . SPINAL CORD STIMULATOR REMOVAL N/A 06/16/2017   Procedure: SPINAL CORD STIMULATOR REMOVAL;  Surgeon: Newman Pies, MD;  Location: [place];  Service: Neurosurgery;  Laterality: N/A;   Patient Active Problem List   Diagnosis Date Noted  . Epigastric pain   . Special screening for malignant neoplasms, colon   . Chronic, continuous use of opioids 05/27/2017  . Tobacco use 05/27/2017  . Spinal cord stimulator status 04/29/2017  . Acute low back pain without sciatica 11/04/2015  . Lumbar degenerative disc disease 01/30/2015      Prior to Admission medications   Medication Sig Start Date End Date Taking? Authorizing Provider  HYDROcodone-acetaminophen (NORCO) 10-325 MG tablet Take 1 tablet by mouth every 6 (six) hours as needed for moderate pain.     [provider]  omeprazole (PRILOSEC) 40 MG capsule Take 1 capsule (40 mg total) by mouth 2 (two) times daily before a meal. 06/01/17 08/30/17  Vanga, Tally Due, MD  ondansetron (ZOFRAN ODT) 4 MG disintegrating tablet Take 1 tablet (4 mg total) by mouth every 8 (eight) hours as needed for nausea or vomiting. 08/02/17   Merlyn Lot, MD  ranitidine (ZANTAC) 150 MG tablet Take 1 tablet (150 mg total) by mouth daily. 05/27/17   Hubbard Hartshorn, FNP    Allergies Patient has no known allergies.    Social History Social History   Tobacco Use  . Smoking status: Current Every Day Smoker    Packs/day: 1.00    Years: 25.00    Pack years: 25.00    Types: Cigarettes  . Smokeless tobacco: Never Used  . Tobacco comment: down to 1/2 PPD  Substance Use Topics  . Alcohol use: Yes  Alcohol/week: 1.8 oz    Types: 3 Cans of beer per week    Comment: beer on weekend  . Drug use: No    Review of Systems Patient denies headaches, rhinorrhea, blurry vision, numbness, shortness of breath, chest pain, edema, cough, abdominal pain, nausea, vomiting, diarrhea, dysuria, fevers, rashes or hallucinations unless otherwise stated above in HPI. ____________________________________________   PHYSICAL EXAM:  VITAL SIGNS: Vitals:   08/02/17 1536  BP: (!) 141/80  Pulse: 87  Resp: 18  Temp: 98.5 F (36.9 C)  SpO2: 99%    Constitutional: Alert and  oriented. Well appearing and in no acute distress. Eyes: Conjunctivae are normal.  Head: Atraumatic. Nose: No congestion/rhinnorhea. Mouth/Throat: Mucous membranes are moist.   Neck: No stridor. Painless ROM.  Cardiovascular: Normal rate, regular rhythm. Grossly normal heart sounds.  Good peripheral circulation. Respiratory: Normal respiratory effort.  No retractions. Lungs CTAB. Gastrointestinal: Soft and nontender. No distention. No abdominal bruits. No CVA tenderness. Genitourinary:  Musculoskeletal: No lower extremity tenderness nor edema.  No joint effusions. Neurologic:  Normal speech and language. No gross focal neurologic deficits are appreciated. No facial droop Skin:  Skin is warm, dry and intact. No rash noted. Psychiatric: Mood and affect are normal. Speech and behavior are normal.  ____________________________________________   LABS (all labs ordered are listed, but only abnormal results are displayed)  Results for orders placed or performed during the hospital encounter of 08/02/17 (from the past 24 hour(s))  Lipase, blood     Status: None   Collection Time: 08/02/17  3:41 PM  Result Value Ref Range   Lipase 28 11 - 51 U/L  Comprehensive metabolic panel     Status: Abnormal   Collection Time: 08/02/17  3:41 PM  Result Value Ref Range   Sodium 137 135 - 145 mmol/L   Potassium 3.9 3.5 - 5.1 mmol/L   Chloride 106 101 - 111 mmol/L   CO2 23 22 - 32 mmol/L   Glucose, Bld 108 (H) 65 - 99 mg/dL   BUN 11 6 - 20 mg/dL   Creatinine, Ser 0.88 0.61 - 1.24 mg/dL   Calcium 9.4 8.9 - 10.3 mg/dL   Total Protein 8.2 (H) 6.5 - 8.1 g/dL   Albumin 4.6 3.5 - 5.0 g/dL   AST 27 15 - 41 U/L   ALT 23 17 - 63 U/L   Alkaline Phosphatase 75 38 - 126 U/L   Total Bilirubin 0.8 0.3 - 1.2 mg/dL   GFR calc non Af Amer >60 >60 mL/min   GFR calc Af Amer >60 >60 mL/min   Anion gap 8 5 - 15  CBC     Status: Abnormal   Collection Time: 08/02/17  3:41 PM  Result Value Ref Range   WBC 10.4 3.8 -  10.6 K/uL   RBC 5.56 4.40 - 5.90 MIL/uL   Hemoglobin 16.7 13.0 - 18.0 g/dL   HCT 50.5 40.0 - 52.0 %   MCV 90.9 80.0 - 100.0 fL   MCH 30.1 26.0 - 34.0 pg   MCHC 33.1 32.0 - 36.0 g/dL   RDW 14.6 (H) 11.5 - 14.5 %   Platelets 189 150 - 440 K/uL  Urinalysis, Complete w Microscopic     Status: Abnormal   Collection Time: 08/02/17  3:41 PM  Result Value Ref Range   Color, Urine YELLOW (A) YELLOW   APPearance CLEAR (A) CLEAR   Specific Gravity, Urine 1.023 1.005 - 1.030   pH 5.0 5.0 - 8.0   Glucose,  UA NEGATIVE NEGATIVE mg/dL   Hgb urine dipstick MODERATE (A) NEGATIVE   Bilirubin Urine NEGATIVE NEGATIVE   Ketones, ur NEGATIVE NEGATIVE mg/dL   Protein, ur NEGATIVE NEGATIVE mg/dL   Nitrite NEGATIVE NEGATIVE   Leukocytes, UA NEGATIVE NEGATIVE   RBC / HPF 0-5 0 - 5 RBC/hpf   WBC, UA 0-5 0 - 5 WBC/hpf   Bacteria, UA NONE SEEN NONE SEEN   Squamous Epithelial / LPF 0-5 (A) NONE SEEN   Mucus PRESENT    ____________________________________________  ______________________________  RADIOLOGY   ____________________________________________   PROCEDURES  Procedure(s) performed:  Procedures    Critical Care performed: no ____________________________________________   INITIAL IMPRESSION / ASSESSMENT AND PLAN / ED COURSE  Pertinent labs & imaging results that were available during my care of the patient were reviewed by me and considered in my medical decision making (see chart for details).  DDX: Enteritis, colitis, obstruction, UTI, stone, appendicitis  Wesley Adams is a 52 y.o. who presents to the ED with symptoms as described above.  Patient well-appearing.  Blood work ordered with no differential was reassuring.  His abdominal exam is soft and benign.  Most clinically consistent with enteritis.  Patient tolerating oral hydration.  Will be given prescription for Zofran discussed strict return precautions.  Provide work note.      As part of my medical decision making, I  reviewed the following data within the [place] notes reviewed and incorporated, Labs reviewed, notes from prior ED visits and [place] Controlled Substance Database   ____________________________________________   FINAL CLINICAL IMPRESSION(S) / ED DIAGNOSES  Final diagnoses:  Nausea vomiting and diarrhea      NEW MEDICATIONS STARTED DURING THIS VISIT:  New Prescriptions   ONDANSETRON (ZOFRAN ODT) 4 MG DISINTEGRATING TABLET    Take 1 tablet (4 mg total) by mouth every 8 (eight) hours as needed for nausea or vomiting.     Note:  This document was prepared using Dragon voice recognition software and may include unintentional dictation errors.    Merlyn Lot, MD 08/02/17 Jeri Lager

## 2017-08-20 ENCOUNTER — Other Ambulatory Visit: Payer: Self-pay | Admitting: Family Medicine

## 2017-08-20 DIAGNOSIS — R1013 Epigastric pain: Secondary | ICD-10-CM

## 2017-08-22 ENCOUNTER — Other Ambulatory Visit: Payer: Self-pay | Admitting: Gastroenterology

## 2017-08-22 DIAGNOSIS — R1013 Epigastric pain: Secondary | ICD-10-CM

## 2017-08-25 ENCOUNTER — Encounter: Payer: Self-pay | Admitting: Student in an Organized Health Care Education/Training Program

## 2017-08-25 ENCOUNTER — Other Ambulatory Visit: Payer: Self-pay

## 2017-08-25 ENCOUNTER — Ambulatory Visit
Payer: BLUE CROSS/BLUE SHIELD | Attending: Student in an Organized Health Care Education/Training Program | Admitting: Student in an Organized Health Care Education/Training Program

## 2017-08-25 VITALS — BP 160/92 | HR 58 | Temp 97.9°F | Resp 16 | Ht 72.0 in | Wt 205.0 lb

## 2017-08-25 DIAGNOSIS — T85192D Other mechanical complication of implanted electronic neurostimulator (electrode) of spinal cord, subsequent encounter: Secondary | ICD-10-CM | POA: Diagnosis not present

## 2017-08-25 DIAGNOSIS — M5136 Other intervertebral disc degeneration, lumbar region: Secondary | ICD-10-CM | POA: Insufficient documentation

## 2017-08-25 DIAGNOSIS — G894 Chronic pain syndrome: Secondary | ICD-10-CM | POA: Insufficient documentation

## 2017-08-25 DIAGNOSIS — M961 Postlaminectomy syndrome, not elsewhere classified: Secondary | ICD-10-CM | POA: Diagnosis not present

## 2017-08-25 DIAGNOSIS — M545 Low back pain: Secondary | ICD-10-CM | POA: Insufficient documentation

## 2017-08-25 DIAGNOSIS — R1013 Epigastric pain: Secondary | ICD-10-CM | POA: Insufficient documentation

## 2017-08-25 DIAGNOSIS — F17210 Nicotine dependence, cigarettes, uncomplicated: Secondary | ICD-10-CM | POA: Insufficient documentation

## 2017-08-25 DIAGNOSIS — M25552 Pain in left hip: Secondary | ICD-10-CM | POA: Diagnosis not present

## 2017-08-25 DIAGNOSIS — M48061 Spinal stenosis, lumbar region without neurogenic claudication: Secondary | ICD-10-CM | POA: Diagnosis not present

## 2017-08-25 DIAGNOSIS — M5416 Radiculopathy, lumbar region: Secondary | ICD-10-CM | POA: Diagnosis not present

## 2017-08-25 DIAGNOSIS — Z981 Arthrodesis status: Secondary | ICD-10-CM

## 2017-08-25 DIAGNOSIS — Z79899 Other long term (current) drug therapy: Secondary | ICD-10-CM | POA: Diagnosis not present

## 2017-08-25 DIAGNOSIS — M9983 Other biomechanical lesions of lumbar region: Secondary | ICD-10-CM

## 2017-08-25 DIAGNOSIS — Z79891 Long term (current) use of opiate analgesic: Secondary | ICD-10-CM | POA: Diagnosis not present

## 2017-08-25 MED ORDER — TIZANIDINE HCL 4 MG PO TABS: 4.0000 mg | ORAL_TABLET | Freq: Three times a day (TID) | ORAL | 1 refills | Status: DC | PRN

## 2017-08-25 NOTE — Progress Notes (Signed)
Safety precautions to be maintained throughout the outpatient stay will include: orient to surroundings, keep bed in low position, maintain call bell within reach at all times, provide assistance with transfer out of bed and ambulation.  

## 2017-08-25 NOTE — Progress Notes (Signed)
Patient's Name: Wesley Adams  MRN: [id number]  Referring Provider: Hubbard Hartshorn, FNP  DOB: 05/31/1965  PCP: Steele Sizer, MD  DOS: 08/25/2017  Note by: Gillis Santa, MD  Service setting: Ambulatory outpatient  Specialty: Interventional Pain Management  Location: [facility] (AMB) Pain Management Facility  Visit type: Initial Patient Evaluation  Patient type: New Patient   Primary Reason(s) for Visit: Encounter for initial evaluation of one or more chronic problems (new to examiner) potentially causing chronic pain, and posing a threat to normal musculoskeletal function. (Level of risk: High) CC: Back Pain (lower, left) and Hip Pain (left)  HPI  Wesley Adams is a 52 y.o. year old, male patient, who comes today to see [place] for the first time for an initial evaluation of his chronic pain. He has Lumbar degenerative disc disease; Acute low back pain without sciatica; Spinal cord stimulator status; Chronic, continuous use of opioids; Tobacco use; Epigastric pain; and Special screening for malignant neoplasms, colon on their problem list. Today he comes in for evaluation of his Back Pain (lower, left) and Hip Pain (left)  Pain Assessment: Location: Left, Lower Back Radiating: sometimes pain radiates to right side of lower back; sometimes pain/numbness radiates down back of left leg to knee Onset: More than a month ago Duration: Chronic pain Quality: Constant, Numbness, Burning Severity: 7 /10 (self-reported pain score)  Note: Reported level is inconsistent with clinical observations.                         When using our objective Pain Scale, levels between 6 and 10/10 are said to belong in an emergency room, as it progressively worsens from a 6/10, described as severely limiting, requiring emergency care not usually available at an outpatient pain management facility. At a 6/10 level, communication becomes difficult and requires great effort. Assistance to reach the emergency department may be required.  Facial flushing and profuse sweating along with potentially dangerous increases in heart rate and blood pressure will be evident. Effect on ADL: limits activities with 4 young children (ages 52-6yo); cannot pursue racecar hobby Timing: Constant Modifying factors: hydrocodone, hot showers  Onset and Duration: Present longer than 3 months Cause of pain: Work related accident or event Severity: NAS-11 at its worse: 10/10, NAS-11 at its best: 6/10, NAS-11 now: 7/10 and NAS-11 on the average: 7/10 Timing: Morning, Night, During activity or exercise and After activity or exercise Aggravating Factors: Bending, Climbing, Lifiting, Prolonged sitting, Prolonged standing, Twisting and Walking Alleviating Factors: Medications and Warm showers or baths Associated Problems: Numbness, Spasms, Tingling, Weakness and Pain that wakes patient up Quality of Pain: Aching, Burning, Nagging and Sharp Previous Examinations or Tests: MRI scan, Myelogram, X-rays and Neurosurgical evaluation Previous Treatments: Narcotic medications and Spinal cord stimulator  The patient comes into the clinics today for the first time for a chronic pain management evaluation.   52 year old male with history of chronic low back pain status post lumbar discectomy followed by revision surgery which included L4-L5 decompression, instrumentation, fusion.  This was in 2016.  Patient continued to have persistent left-sided and left radicular pain radicular pain.  His neurosurgeon, Dr. Arnoldo Morale proceeded to place a spinal cord stimulator on 03/23/2017 which was not beneficial for his back pain.  Stimulator did not also provide any benefit for his chronic epigastric pain either.  Stimulator was removed on 06/16/2017.  Patient is on chronic opioid therapy which includes hydrocodone 10 mg 3-4 times daily as needed which she  has been consistently receiving from his neurosurgeon Dr. Arnoldo Morale since March 2016.  Patient has tried various medications in the  past including membrane stabilizers such as gabapentin, TCA which he cannot recall, muscle relaxants including Flexeril and baclofen, NSAIDs including ibuprofen, naproxen, diclofenac.  Today I took the time to provide the patient with information regarding my pain practice. The patient was informed that my practice is divided into two sections: an interventional pain management section, as well as a completely separate and distinct medication management section. I explained that I have procedure days for my interventional therapies, and evaluation days for follow-ups and medication management. Because of the amount of documentation required during both, they are kept separated. This means that there is the possibility that he may be scheduled for a procedure on one day, and medication management the next. I have also informed him that because of staffing and facility limitations, I no longer take patients for medication management only. To illustrate the reasons for this, I gave the patient the example of surgeons, and how inappropriate it would be to refer a patient to his/her care, just to write for the post-surgical antibiotics on a surgery done by a different surgeon.   Because interventional pain management is my board-certified specialty, the patient was informed that joining my practice means that they are open to any and all interventional therapies. I made it clear that this does not mean that they will be forced to have any procedures done. What this means is that I believe interventional therapies to be essential part of the diagnosis and proper management of chronic pain conditions. Therefore, patients not interested in these interventional alternatives will be better served under the care of a different practitioner.  The patient was also made aware of my Comprehensive Pain Management Safety Guidelines where by joining my practice, they limit all of their nerve blocks and joint injections to those  done by our practice, for as long as we are retained to manage their care.   Historic Controlled Substance Pharmacotherapy Review  PMP and historical list of controlled substances: Hydrocodone 10 mg 3 times daily to 4 times daily as needed, quantity 90-100 a month MME/day: 50-60 mg/day Medications: The patient did not bring the medication(s) to the appointment, as requested in our "New Patient Package" Pharmacodynamics: Desired effects: Analgesia: The patient reports >50% benefit. Reported improvement in function: The patient reports medication allows him to accomplish basic ADLs. Clinically meaningful improvement in function (CMIF): Sustained CMIF goals met Perceived effectiveness: Described as relatively effective, allowing for increase in activities of daily living (ADL) Undesirable effects: Side-effects or Adverse reactions: None reported Historical Monitoring: The patient  reports that he does not use drugs. List of all UDS Test(s): No results found for: MDMA, COCAINSCRNUR, [place], [place], CANNABQUANT, [place], [place] List of other Serum/Urine Drug Screening Test(s):  No results found for: AMPHSCRSER, BARBSCRSER, BENZOSCRSER, COCAINSCRSER, COCAINSCRNUR, PCPSCRSER, PCPQUANT, THCSCRSER, THCU, CANNABQUANT, OPIATESCRSER, OXYSCRSER, PROPOXSCRSER, ETH Historical Background Evaluation: [place] PMP: Six (6) year initial data search conducted.             [place] Department of public safety, offender search: Editor, commissioning Information) Non-contributory Risk Assessment Profile: Aberrant behavior: None observed or detected today Risk factors for fatal opioid overdose: None identified today Fatal overdose hazard ratio (HR): Calculation deferred Non-fatal overdose hazard ratio (HR): Calculation deferred Risk of opioid abuse or dependence: 0.7-3.0% with doses ? 36 MME/day and 6.1-26% with doses ? 120 MME/day. Substance use disorder (SUD) risk level: Moderate Opioid risk  tool (ORT) (Total Score): 0 Opioid Risk  Tool - 08/25/17 9244      Family History of Substance Abuse   Alcohol  Negative    Illegal Drugs  Negative    Rx Drugs  Negative      Personal History of Substance Abuse   Alcohol  Negative    Illegal Drugs  Negative      Age   Age between 78-45 years   No      History of Preadolescent Sexual Abuse   History of Preadolescent Sexual Abuse  Negative or Male      Psychological Disease   Psychological Disease  Negative    Depression  Negative      Total Score   Opioid Risk Tool Scoring  0    Opioid Risk Interpretation  Low Risk      ORT Scoring interpretation table:  Score <3 = Low Risk for SUD  Score between 4-7 = Moderate Risk for SUD  Score >8 = High Risk for Opioid Abuse   PHQ-2 Depression Scale:  Total score: 0  PHQ-2 Scoring interpretation table: (Score and probability of major depressive disorder)  Score 0 = No depression  Score 1 = 15.4% Probability  Score 2 = 21.1% Probability  Score 3 = 38.4% Probability  Score 4 = 45.5% Probability  Score 5 = 56.4% Probability  Score 6 = 78.6% Probability   PHQ-9 Depression Scale:  Total score: 0  PHQ-9 Scoring interpretation table:  Score 0-4 = No depression  Score 5-9 = Mild depression  Score 10-14 = Moderate depression  Score 15-19 = Moderately severe depression  Score 20-27 = Severe depression (2.4 times higher risk of SUD and 2.89 times higher risk of overuse)   Pharmacologic Plan: As per protocol, I have not taken over any controlled substance management, pending the results of ordered tests and/or consults.            Initial impression: Pending review of available data and ordered tests.  Meds   Current Outpatient Medications:  .  HYDROcodone-acetaminophen (NORCO) 10-325 MG tablet, Take 1 tablet by mouth every 6 (six) hours as needed for moderate pain. , Disp: , Rfl:  .  omeprazole (PRILOSEC) 40 MG capsule, TAKE 1 CAPSULE (40 MG TOTAL) BY MOUTH 2 (TWO) TIMES DAILY BEFORE A MEAL., Disp: 180 capsule, Rfl: 0 .   ondansetron (ZOFRAN ODT) 4 MG disintegrating tablet, Take 1 tablet (4 mg total) by mouth every 8 (eight) hours as needed for nausea or vomiting. (Patient not taking: Reported on 08/25/2017), Disp: 10 tablet, Rfl: 0 .  ranitidine (ZANTAC) 150 MG tablet, Take 1 tablet (150 mg total) by mouth daily. (Patient not taking: Reported on 08/25/2017), Disp: 90 tablet, Rfl: 0 .  tiZANidine (ZANAFLEX) 4 MG tablet, Take 1 tablet (4 mg total) by mouth every 8 (eight) hours as needed for muscle spasms., Disp: 90 tablet, Rfl: 1  Imaging Review   Results for orders placed during the hospital encounter of 11/21/15  CT LUMBAR SPINE W CONTRAST   Narrative CLINICAL DATA:  Chronic low back pain with posterolateral right lower extremity pain. EXAM: LUMBAR MYELOGRAM FLUOROSCOPY TIME:  Radiation Exposure Index (as provided by the fluoroscopic device): 189.52 microGray*m^2 Fluoroscopy Time (in minutes and seconds):  31 seconds PROCEDURE: After thorough discussion of risks and benefits of the procedure including bleeding, infection, injury to nerves, blood vessels, adjacent structures as well as headache and CSF leak, written and oral informed consent was obtained. Consent was obtained  by Dr. Logan Bores. Time out form was completed. Patient was positioned prone on the fluoroscopy table. Local anesthesia was provided with 1% lidocaine without epinephrine after prepped and draped in the usual sterile fashion. Puncture was performed at L3-4 using a 3 1/2 inch 22-gauge spinal needle via a right paramedian approach. Using a single pass through the dura, the needle was placed within the thecal sac, with return of clear CSF. 15 mL of Isovue M 200 was injected into the thecal sac, with normal opacification of the nerve roots and cauda equina consistent with free flow within the subarachnoid space. I personally performed the lumbar puncture and administered the intrathecal contrast. I also personally supervised acquisition  of the myelogram images. TECHNIQUE: Contiguous axial images were obtained through the Lumbar spine after the intrathecal infusion of infusion. Coronal and sagittal reconstructions were obtained of the axial image sets. COMPARISON:  Lumbar spine MRI 07/18/2015 FINDINGS: LUMBAR MYELOGRAM FINDINGS: Normal lumbar segmentation. There is mild lumbar dextroscoliosis with apex at L3-4. There is no listhesis, and there is no evidence of abnormal motion on upright flexion or extension images. Small ventral extradural defects are present from L1-2 to L3-4 with evidence of likely mild spinal stenosis at the 2 latter levels. Sequelae of L4-5 posterior and interbody fusion are again identified. CT LUMBAR MYELOGRAM FINDINGS: Mild lumbar dextroscoliosis with with apex at L3-4. No listhesis. No fracture. Mild disc space narrowing at L1-2. Prior L4-5 PLIF with evidence of solid osseous fusion across the disc space. No evidence of pedicle screw loosening or fracture. Conus medullaris terminates at L1. Moderate aortic atherosclerosis. L1-2: Circumferential disc bulging slightly asymmetric to the left and mild facet and ligamentum flavum hypertrophy result in borderline to mild spinal stenosis and mild left lateral recess stenosis, unchanged. No spinal stenosis. L2-3: Mild circumferential disc bulging and moderate ligamentum flavum thickening result in mild spinal stenosis, mild bilateral lateral recess stenosis, and minimal bilateral neural foraminal stenosis, unchanged. L3-4: Circumferential disc bulging, right foraminal/ extraforaminal disc osteophyte complex, and moderate ligamentum flavum thickening result in mild spinal stenosis and moderate right and mild left neural foraminal stenosis, unchanged. Disc and osteophyte displace the right L3 nerve lateral to the foramen. L4-5: Prior laminectomies and fusion. At most mild left neural foraminal narrowing due to spurring. No spinal stenosis. L5-S1:   Mild facet arthrosis without disc herniation or stenosis. IMPRESSION: 1. Mild multifactorial spinal stenosis at L2-3 and L3-4. 2. Moderate right foraminal stenosis at L3-4. Extraforaminal right L3 nerve displacement by disc and osteophyte. 3. L4-5 fusion without residual stenosis. 4. Aortic atherosclerosis. Electronically Signed   By: Logan Bores M.D.   On: 11/21/2015 17:12   Lumbar DG 1V:  Results for orders placed during the hospital encounter of 01/30/15  DG Lumbar Spine 1 View   Narrative CLINICAL DATA:  M54.9 (ICD-10-CM) - Back pain  EXAM: LUMBAR SPINE - 1 VIEW  COMPARISON:  MRI 11/14/2014  FINDINGS: Posterior retractor is in place. A surgical instrument is identified posterior to the disc space at L3-4.  IMPRESSION: Intraoperative localization.   Electronically Signed   By: Nolon Nations M.D.   On: 01/30/2015 13:22     Lumbar DG 2-3 views:  Results for orders placed during the hospital encounter of 01/30/15  DG Lumbar Spine 2-3 Views   Narrative CLINICAL DATA:  Lumbar fusion.  EXAM: DG C-ARM 61-120 MIN; LUMBAR SPINE - 2-3 VIEW  COMPARISON:  Intraoperative films, same date.  FINDINGS: There are pedicle screws and interbody fusion device at  T5-5. No complicating features are demonstrated. Normal alignment of the lumbar vertebral bodies.  IMPRESSION: Pedicle screws and interbody fusion device in good position at D3-2 without complicating features.   Electronically Signed   By: Marijo Sanes M.D.   On: 01/30/2015 16:23    Lumbar DG (Complete) 4+V:  Results for orders placed during the hospital encounter of 11/04/15  DG Lumbar Spine Complete   Narrative CLINICAL DATA:  Low back injury bending over 6 days ago with onset of low back pain and right leg weakness. History of prior lumbar spine surgery.  EXAM: LUMBAR SPINE - COMPLETE 4+ VIEW  COMPARISON:  MRI lumbar spine 07/18/2015. Plain films lumbar spine 05/02/2015.  FINDINGS: Vertebral  body height and alignment are maintained. Convex right scoliosis is unchanged. The patient is status post L4-5 fusion. Hardware is intact. Aortic atherosclerosis is noted.  IMPRESSION: No acute abnormality or change compared to the prior examination.  Status post L4-5 fusion.  Convex right scoliosis.   Electronically Signed   By: Inge Rise M.D.   On: 11/04/2015 12:52     Lumbar DG Myelogram Lumbosacral:  Results for orders placed during the hospital encounter of 11/21/15  DG MYELOGRAPHY LUMBAR INJ LUMBOSACRAL   Narrative CLINICAL DATA:  Chronic low back pain with posterolateral right lower extremity pain. EXAM: LUMBAR MYELOGRAM FLUOROSCOPY TIME:  Radiation Exposure Index (as provided by the fluoroscopic device): 189.52 microGray*m^2 Fluoroscopy Time (in minutes and seconds):  31 seconds PROCEDURE: After thorough discussion of risks and benefits of the procedure including bleeding, infection, injury to nerves, blood vessels, adjacent structures as well as headache and CSF leak, written and oral informed consent was obtained. Consent was obtained by Dr. Logan Bores. Time out form was completed. Patient was positioned prone on the fluoroscopy table. Local anesthesia was provided with 1% lidocaine without epinephrine after prepped and draped in the usual sterile fashion. Puncture was performed at L3-4 using a 3 1/2 inch 22-gauge spinal needle via a right paramedian approach. Using a single pass through the dura, the needle was placed within the thecal sac, with return of clear CSF. 15 mL of Isovue M 200 was injected into the thecal sac, with normal opacification of the nerve roots and cauda equina consistent with free flow within the subarachnoid space. I personally performed the lumbar puncture and administered the intrathecal contrast. I also personally supervised acquisition of the myelogram images. TECHNIQUE: Contiguous axial images were obtained through the  Lumbar spine after the intrathecal infusion of infusion. Coronal and sagittal reconstructions were obtained of the axial image sets. COMPARISON:  Lumbar spine MRI 07/18/2015 FINDINGS: LUMBAR MYELOGRAM FINDINGS: Normal lumbar segmentation. There is mild lumbar dextroscoliosis with apex at L3-4. There is no listhesis, and there is no evidence of abnormal motion on upright flexion or extension images. Small ventral extradural defects are present from L1-2 to L3-4 with evidence of likely mild spinal stenosis at the 2 latter levels. Sequelae of L4-5 posterior and interbody fusion are again identified. CT LUMBAR MYELOGRAM FINDINGS: Mild lumbar dextroscoliosis with with apex at L3-4. No listhesis. No fracture. Mild disc space narrowing at L1-2. Prior L4-5 PLIF with evidence of solid osseous fusion across the disc space. No evidence of pedicle screw loosening or fracture. Conus medullaris terminates at L1. Moderate aortic atherosclerosis. L1-2: Circumferential disc bulging slightly asymmetric to the left and mild facet and ligamentum flavum hypertrophy result in borderline to mild spinal stenosis and mild left lateral recess stenosis, unchanged. No spinal stenosis. L2-3: Mild circumferential disc  bulging and moderate ligamentum flavum thickening result in mild spinal stenosis, mild bilateral lateral recess stenosis, and minimal bilateral neural foraminal stenosis, unchanged. L3-4: Circumferential disc bulging, right foraminal/ extraforaminal disc osteophyte complex, and moderate ligamentum flavum thickening result in mild spinal stenosis and moderate right and mild left neural foraminal stenosis, unchanged. Disc and osteophyte displace the right L3 nerve lateral to the foramen. L4-5: Prior laminectomies and fusion. At most mild left neural foraminal narrowing due to spurring. No spinal stenosis. L5-S1:  Mild facet arthrosis without disc herniation or stenosis. IMPRESSION: 1. Mild  multifactorial spinal stenosis at L2-3 and L3-4. 2. Moderate right foraminal stenosis at L3-4. Extraforaminal right L3 nerve displacement by disc and osteophyte. 3. L4-5 fusion without residual stenosis. 4. Aortic atherosclerosis. Electronically Signed   By: Logan Bores M.D.   On: 11/21/2015 17:12   Complexity Note: Imaging results reviewed. Results shared with Mr. Davies, using Layman's terms.                         ROS  Cardiovascular: No reported cardiovascular signs or symptoms such as High blood pressure, coronary artery disease, abnormal heart rate or rhythm, heart attack, blood thinner therapy or heart weakness and/or failure Pulmonary or Respiratory: Smoking Neurological: No reported neurological signs or symptoms such as seizures, abnormal skin sensations, urinary and/or fecal incontinence, being born with an abnormal open spine and/or a tethered spinal cord Review of Past Neurological Studies: No results found for this or any previous visit. Psychological-Psychiatric: No reported psychological or psychiatric signs or symptoms such as difficulty sleeping, anxiety, depression, delusions or hallucinations (schizophrenial), mood swings (bipolar disorders) or suicidal ideations or attempts Gastrointestinal: Vomiting blood (Ulcers) and Reflux or heatburn Genitourinary: No reported renal or genitourinary signs or symptoms such as difficulty voiding or producing urine, peeing blood, non-functioning kidney, kidney stones, difficulty emptying the bladder, difficulty controlling the flow of urine, or chronic kidney disease Hematological: No reported hematological signs or symptoms such as prolonged bleeding, low or poor functioning platelets, bruising or bleeding easily, hereditary bleeding problems, low energy levels due to low hemoglobin or being anemic Endocrine: No reported endocrine signs or symptoms such as high or low blood sugar, rapid heart rate due to high thyroid levels, obesity or  weight gain due to slow thyroid or thyroid disease Rheumatologic: No reported rheumatological signs and symptoms such as fatigue, joint pain, tenderness, swelling, redness, heat, stiffness, decreased range of motion, with or without associated rash Musculoskeletal: Negative for myasthenia gravis, muscular dystrophy, multiple sclerosis or malignant hyperthermia Work History: Working part time  Allergies  Mr. Getter has No Known Allergies.  Laboratory Chemistry  Inflammation Markers (CRP: Acute Phase) (ESR: Chronic Phase) No results found for: CRP, ESRSEDRATE, LATICACIDVEN                       Rheumatology Markers No results found for: RF, ANA, Therisa [place], [facility]                      Renal Function Markers Lab Results  Component Value Date   BUN 11 08/02/2017   CREATININE 0.88 08/02/2017   GFRAA >60 08/02/2017   GFRNONAA >60 08/02/2017                              Hepatic Function Markers Lab Results  Component Value Date   AST 27 08/02/2017  ALT 23 08/02/2017   ALBUMIN 4.6 08/02/2017   ALKPHOS 75 08/02/2017   LIPASE 28 08/02/2017                        Electrolytes Lab Results  Component Value Date   NA 137 08/02/2017   K 3.9 08/02/2017   CL 106 08/02/2017   CALCIUM 9.4 08/02/2017                        Neuropathy Markers Lab Results  Component Value Date   HIV NONREACTIVE 01/07/2016                        Bone Pathology Markers No results found for: [place], [id number], [id number], [id number], 25OHVITD1, 25OHVITD2, 25OHVITD3, TESTOFREE, TESTOSTERONE                       Coagulation Parameters Lab Results  Component Value Date   PLT 189 08/02/2017                        Cardiovascular Markers Lab Results  Component Value Date   HGB 16.7 08/02/2017   HCT 50.5 08/02/2017                         CA Markers No results found for: CEA, CA125, LABCA2                      Note: Lab results reviewed.  [place]  Drug: Mr. Hettich   reports that he does not use drugs. Alcohol:  reports that he drinks about 1.8 oz of alcohol per week. Tobacco:  reports that he has been smoking cigarettes.  He has a 25.00 pack-year smoking history. He has never used smokeless tobacco. Medical:  has a past medical history of Chronic back pain. Family: family history includes Alcohol abuse in his mother; Heart attack in his father; Heart disease in his father; Obesity in his father.  Past Surgical History:  Procedure Laterality Date  . BACK SURGERY  Feb 2016   cut disc off of nerve with Dr Arnoldo Morale  . COLONOSCOPY WITH PROPOFOL N/A 06/01/2017   Procedure: COLONOSCOPY WITH PROPOFOL;  Surgeon: Lin Landsman, MD;  Location: [facility];  Service: Endoscopy;  Laterality: N/A;  . ESOPHAGOGASTRODUODENOSCOPY (EGD) WITH PROPOFOL  06/01/2017   Procedure: ESOPHAGOGASTRODUODENOSCOPY (EGD) WITH PROPOFOL;  Surgeon: Lin Landsman, MD;  Location: [facility];  Service: Endoscopy;;  . FRACTURE SURGERY Right as child   rod in right femur  . POLYPECTOMY  06/01/2017   Procedure: POLYPECTOMY;  Surgeon: Lin Landsman, MD;  Location: [place];  Service: Endoscopy;;  . SPINAL CORD STIMULATOR INSERTION  03/23/2017   Dr Newman Pies, [facility] Neurosurgery  . SPINAL CORD STIMULATOR REMOVAL N/A 06/16/2017   Procedure: SPINAL CORD STIMULATOR REMOVAL;  Surgeon: Newman Pies, MD;  Location: [place];  Service: Neurosurgery;  Laterality: N/A;   Active Ambulatory Problems    Diagnosis Date Noted  . Lumbar degenerative disc disease 01/30/2015  . Acute low back pain without sciatica 11/04/2015  . Spinal cord stimulator status 04/29/2017  . Chronic, continuous use of opioids 05/27/2017  . Tobacco use 05/27/2017  . Epigastric pain   . Special screening for malignant neoplasms, colon    Resolved Ambulatory Problems    Diagnosis Date Noted  .  No Resolved Ambulatory Problems   Past Medical History:  Diagnosis Date  . Chronic  back pain    Constitutional Exam  General appearance: Well nourished, well developed, and well hydrated. In no apparent acute distress Vitals:   08/25/17 0808  BP: (!) 160/92  Pulse: (!) 58  Resp: 16  Temp: 97.9 F (36.6 C)  TempSrc: Oral  SpO2: 100%  Weight: 205 lb (93 kg)  Height: 6' (1.829 m)   BMI Assessment: Estimated body mass index is 27.8 kg/m as calculated from the following:   Height as of this encounter: 6' (1.829 m).   Weight as of this encounter: 205 lb (93 kg).  BMI interpretation table: BMI level Category Range association with higher incidence of chronic pain  <18 kg/m2 Underweight   18.5-24.9 kg/m2 Ideal body weight   25-29.9 kg/m2 Overweight Increased incidence by 20%  30-34.9 kg/m2 Obese (Class I) Increased incidence by 68%  35-39.9 kg/m2 Severe obesity (Class II) Increased incidence by 136%  >40 kg/m2 Extreme obesity (Class III) Increased incidence by 254%   Patient's current BMI Ideal Body weight  Body mass index is 27.8 kg/m. Ideal body weight: 77.6 kg (171 lb 1.2 oz) Adjusted ideal body weight: 83.8 kg (184 lb 10.3 oz)   BMI Readings from Last 4 Encounters:  08/25/17 27.80 kg/m  08/02/17 27.71 kg/m  06/13/17 27.19 kg/m  06/01/17 26.39 kg/m   Wt Readings from Last 4 Encounters:  08/25/17 205 lb (93 kg)  08/02/17 210 lb (95.3 kg)  06/13/17 206 lb 1.6 oz (93.5 kg)  06/01/17 200 lb (90.7 kg)  Psych/Mental status: Alert, oriented x 3 (person, place, & time)       Eyes: PERLA Respiratory: No evidence of acute respiratory distress  Cervical Spine Area Exam  Skin & Axial Inspection: No masses, redness, edema, swelling, or associated skin lesions Alignment: Symmetrical Functional ROM: Unrestricted ROM      Stability: No instability detected Muscle Tone/Strength: Functionally intact. No obvious neuro-muscular anomalies detected. Sensory (Neurological): Unimpaired Palpation: No palpable anomalies              Upper Extremity (UE) Exam     Side: Right upper extremity  Side: Left upper extremity  Skin & Extremity Inspection: Skin color, temperature, and hair growth are WNL. No peripheral edema or cyanosis. No masses, redness, swelling, asymmetry, or associated skin lesions. No contractures.  Skin & Extremity Inspection: Skin color, temperature, and hair growth are WNL. No peripheral edema or cyanosis. No masses, redness, swelling, asymmetry, or associated skin lesions. No contractures.  Functional ROM: Unrestricted ROM          Functional ROM: Unrestricted ROM          Muscle Tone/Strength: Functionally intact. No obvious neuro-muscular anomalies detected.  Muscle Tone/Strength: Functionally intact. No obvious neuro-muscular anomalies detected.  Sensory (Neurological): Unimpaired          Sensory (Neurological): Unimpaired          Palpation: No palpable anomalies              Palpation: No palpable anomalies              Specialized Test(s): Deferred         Specialized Test(s): Deferred          Thoracic Spine Area Exam  Skin & Axial Inspection: No masses, redness, or swelling Alignment: Symmetrical Functional ROM: Unrestricted ROM Stability: No instability detected Muscle Tone/Strength: Functionally intact. No obvious neuro-muscular anomalies detected. Sensory (Neurological):  Unimpaired Muscle strength & Tone: No palpable anomalies  Lumbar Spine Area Exam  Skin & Axial Inspection: Well healed scar from previous spine surgery detected Alignment: Symmetrical Functional ROM: Decreased ROM       Stability: No instability detected Muscle Tone/Strength: Functionally intact. No obvious neuro-muscular anomalies detected. Sensory (Neurological): Dermatomal pain pattern Palpation: Complains of area being tender to palpation       Provocative Tests: Lumbar Hyperextension and rotation test: Positive due to fusion restriction. Lumbar Lateral bending test: Positive ipsilateral radicular pain, bilaterally. Positive for bilateral  foraminal stenosis. Patrick's Maneuver: evaluation deferred today                    Gait & Posture Assessment  Ambulation: Unassisted Gait: Relatively normal for age and body habitus Posture: WNL   Lower Extremity Exam    Side: Right lower extremity  Side: Left lower extremity  Skin & Extremity Inspection: Skin color, temperature, and hair growth are WNL. No peripheral edema or cyanosis. No masses, redness, swelling, asymmetry, or associated skin lesions. No contractures.  Skin & Extremity Inspection: Skin color, temperature, and hair growth are WNL. No peripheral edema or cyanosis. No masses, redness, swelling, asymmetry, or associated skin lesions. No contractures.  Functional ROM: Unrestricted ROM          Functional ROM: Unrestricted ROM          Muscle Tone/Strength: Functionally intact. No obvious neuro-muscular anomalies detected.  Muscle Tone/Strength: Functionally intact. No obvious neuro-muscular anomalies detected.  Sensory (Neurological): Unimpaired  Sensory (Neurological): Unimpaired  Palpation: No palpable anomalies  Palpation: No palpable anomalies   Assessment  Primary Diagnosis & Pertinent Problem List: The primary encounter diagnosis was Failed back surgical syndrome. 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  Visit Diagnosis (New problems to examiner): 1. Failed back surgical syndrome   2. 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 Spinal stenosis of lumbar region with radiculopathy   5. Failure of spinal cord stimulator, subsequent encounter   6. Chronic pain syndrome   7. Lumbar radiculopathy (L>R)    52 year old male with history of chronic low back pain that radiates to his  left leg we discussed status post lumbar discectomy followed by revision surgery which included L4-L5 decompression, instrumentation, fusion.  This was in 2016.  Patient continued to have persistent left-sided radicular pain.  His neurosurgeon, Dr. Arnoldo Morale proceeded to place a spinal cord stimulator on 03/23/2017 which was not beneficial for his back pain.  Stimulator did not also provide any benefit for his chronic epigastric pain either.  Stimulator was removed on 06/16/2017.  Patient is on chronic opioid therapy which includes hydrocodone 10 mg 4 times daily as needed which she has been consistently receiving from his neurosurgeon Dr. Arnoldo Morale since March 2016.  Patient has tried various medications in the past including membrane stabilizers such as gabapentin, TCA which he cannot recall, muscle relaxants including Flexeril and baclofen, NSAIDs including ibuprofen, naproxen, diclofenac.  In regards to treatment plan, we can work the patient up for consideration of chronic opioid therapy in our clinic which includes baseline UDS.  Patient has had a psychological evaluation in the past when he was being considered for spinal cord stimulator trial.  There were  no red flags or concerns.    In regard to interventional options, patient has tried spinal cord stimulator and has had that removed since it was not beneficial.  We discussed lumbar versus caudal epidural steroid injection for his chronic lumbar radicular symptoms on the left.  Risks and benefits were discussed.  Patient has never had an epidural steroid injection in the past.  Patient is currently not on any blood thinners.  Plan: -UDS -caudal for sx of LEFT lumbosacral radiculopathy -Pending UDS, COT to consist of chronic opioid regimen of Hydrocodone 10 mg QID prn (which has been his chronic opioid regimen since 2016). MME=40 -Tizanidine prn for muscle spasms/relaxation  Note: Please be advised that as per protocol, today's visit has been an  evaluation only. We have not taken over the patient's controlled substance management.  Ordered Lab-work, Procedure(s), Referral(s), & Consult(s): Orders Placed This Encounter  Procedures  . Lumbar Epidural Injection   Pharmacotherapy (current): Medications ordered:  Meds ordered this encounter  Medications  . tiZANidine (ZANAFLEX) 4 MG tablet    Sig: Take 1 tablet (4 mg total) by mouth every 8 (eight) hours as needed for muscle spasms.    Dispense:  90 tablet    Refill:  1   Medications administered during this visit: Idalia Needle. Nickolson had no medications administered during this visit.   Pharmacological management options:  Opioid Analgesics: The patient was informed that there is no guarantee that he would be a candidate for opioid analgesics. The decision will be made following CDC guidelines. This decision will be based on the results of diagnostic studies, as well as Mr. Esguerra risk profile.   Membrane stabilizer: To be determined at a later time, has tried Gabapentin  Muscle relaxant: To be determined at a later time has tried Flexeril, will trial Tizanidine  NSAID: To be determined at a later time tried Ibuprofen, Naproxen,  Diclofenac  Other analgesic(s): To be determined at a later time   Interventional management options: Mr. Raineri was informed that there is no guarantee that he would be a candidate for interventional therapies. The decision will be based on the results of diagnostic studies, as well as Mr. Rann risk profile.  Procedure(s) under consideration:  Lumbar/Caudal ESI   Provider-requested follow-up: Return in about 2 weeks (around 09/08/2017) for Procedure.  Future Appointments  Date Time Provider [place]  09/12/2017  8:45 AM Gillis Santa, MD [facility] None    Primary Care Physician: Steele Sizer, MD Location: [facility] Note by: Gillis Santa, M.D, Date: 08/25/2017; Time: 9:10 AM  Patient  Instructions   Rx for Zanaflex has been escribed to your pharmacy.  GENERAL RISKS AND COMPLICATIONS  What are the risk, side effects and possible complications? Generally speaking, most procedures are safe.  However, with any procedure there are risks, side effects, and the possibility of complications.  The risks and complications are dependent upon the sites that are lesioned, or the type of nerve block to be performed.  The closer the procedure is to the spine, the more serious the risks are.  Great care is taken when placing the radio frequency needles, block needles or lesioning probes, but sometimes complications can occur. 1. Infection: Any time there is an injection through the skin, there is a risk of infection.  This is why sterile conditions are used for these blocks.  There are four possible types of infection. 1. Localized skin infection. 2. Central Nervous System Infection-This can be in the form  of Meningitis, which can be deadly. 3. Epidural Infections-This can be in the form of an epidural abscess, which can cause pressure inside of the spine, causing compression of the spinal cord with subsequent paralysis. This would require an emergency surgery to decompress, and there are no guarantees that the patient would recover from the paralysis. 4. Discitis-This is an infection of the intervertebral discs.  It occurs in about 1% of discography procedures.  It is difficult to treat and it may lead to surgery.        2. Pain: the needles have to go through skin and soft tissues, will cause soreness.       3. Damage to internal structures:  The nerves to be lesioned may be near blood vessels or    other nerves which can be potentially damaged.       4. Bleeding: Bleeding is more common if the patient is taking blood thinners such as  aspirin, Coumadin, Ticiid, Plavix, etc., or if he/she have some genetic predisposition  such as hemophilia. Bleeding into the spinal canal can cause compression  of the spinal  cord with subsequent paralysis.  This would require an emergency surgery to  decompress and there are no guarantees that the patient would recover from the  paralysis.       5. Pneumothorax:  Puncturing of a lung is a possibility, every time a needle is introduced in  the area of the chest or upper back.  Pneumothorax refers to free air around the  collapsed lung(s), inside of the thoracic cavity (chest cavity).  Another two possible  complications related to a similar event would include: Hemothorax and Chylothorax.   These are variations of the Pneumothorax, where instead of air around the collapsed  lung(s), you may have blood or chyle, respectively.       6. Spinal headaches: They may occur with any procedures in the area of the spine.       7. Persistent CSF (Cerebro-Spinal Fluid) leakage: This is a rare problem, but may occur  with prolonged intrathecal or epidural catheters either due to the formation of a fistulous  track or a dural tear.       8. Nerve damage: By working so close to the spinal cord, there is always a possibility of  nerve damage, which could be as serious as a permanent spinal cord injury with  paralysis.       9. Death:  Although rare, severe deadly allergic reactions known as "Anaphylactic  reaction" can occur to any of the medications used.      10. Worsening of the symptoms:  We can always make thing worse.  What are the chances of something like this happening? Chances of any of this occuring are extremely low.  By statistics, you have more of a chance of getting killed in a motor vehicle accident: while driving to the hospital than any of the above occurring .  Nevertheless, you should be aware that they are possibilities.  In general, it is similar to taking a shower.  Everybody knows that you can slip, hit your head and get killed.  Does that mean that you should not shower again?  Nevertheless always keep in mind that statistics do not mean anything if you  happen to be on the wrong side of them.  Even if a procedure has a 1 (one) in a 1,000,000 (million) chance of going wrong, it you happen to be that one..Also, keep in mind that  by statistics, you have more of a chance of having something go wrong when taking medications.  Who should not have this procedure? If you are on a blood thinning medication (e.g. Coumadin, Plavix, see list of "Blood Thinners"), or if you have an active infection going on, you should not have the procedure.  If you are taking any blood thinners, please inform your physician.  How should I prepare for this procedure?  Do not eat or drink anything at least six hours prior to the procedure.  Bring a driver with you .  It cannot be a taxi.  Come accompanied by an adult that can drive you back, and that is strong enough to help you if your legs get weak or numb from the local anesthetic.  Take all of your medicines the morning of the procedure with just enough water to swallow them.  If you have diabetes, make sure that you are scheduled to have your procedure done first thing in the morning, whenever possible.  If you have diabetes, take only half of your insulin dose and notify our nurse that you have done so as soon as you arrive at the clinic.  If you are diabetic, but only take blood sugar pills (oral hypoglycemic), then do not take them on the morning of your procedure.  You may take them after you have had the procedure.  Do not take aspirin or any aspirin-containing medications, at least eleven (11) days prior to the procedure.  They may prolong bleeding.  Wear loose fitting clothing that may be easy to take off and that you would not mind if it got stained with Betadine or blood.  Do not wear any jewelry or perfume  Remove any nail coloring.  It will interfere with some of our monitoring equipment.  NOTE: Remember that this is not meant to be interpreted as a complete list of all possible complications.   Unforeseen problems may occur.  BLOOD THINNERS The following drugs contain aspirin or other products, which can cause increased bleeding during surgery and should not be taken for 2 weeks prior to and 1 week after surgery.  If you should need take something for relief of minor pain, you may take acetaminophen which is found in Tylenol,m Datril, Anacin-3 and Panadol. It is not blood thinner. The products listed below are.  Do not take any of the products listed below in addition to any listed on your instruction sheet.  A.P.C or A.P.C with Codeine Codeine Phosphate Capsules #3 Ibuprofen Ridaura  ABC compound Congesprin Imuran rimadil  Advil Cope Indocin Robaxisal  Alka-Seltzer Effervescent Pain Reliever and Antacid Coricidin or Coricidin-D  Indomethacin Rufen  Alka-Seltzer plus Cold Medicine Cosprin Ketoprofen S-A-C Tablets  Anacin Analgesic Tablets or Capsules Coumadin Korlgesic Salflex  Anacin Extra Strength Analgesic tablets or capsules CP-2 Tablets Lanoril Salicylate  Anaprox Cuprimine Capsules Levenox Salocol  Anexsia-D Dalteparin Magan Salsalate  Anodynos Darvon compound Magnesium Salicylate Sine-off  Ansaid Dasin Capsules Magsal Sodium Salicylate  Anturane Depen Capsules Marnal Soma  APF Arthritis pain formula Dewitt's Pills Measurin Stanback  Argesic Dia-Gesic Meclofenamic Sulfinpyrazone  Arthritis Bayer Timed Release Aspirin Diclofenac Meclomen Sulindac  Arthritis pain formula Anacin Dicumarol Medipren Supac  Analgesic (Safety coated) Arthralgen Diffunasal Mefanamic Suprofen  Arthritis Strength Bufferin Dihydrocodeine Mepro Compound Suprol  Arthropan liquid Dopirydamole Methcarbomol with Aspirin Synalgos  ASA tablets/Enseals Disalcid Micrainin Tagament  Ascriptin Doan's Midol Talwin  Ascriptin A/D Dolene Mobidin Tanderil  Ascriptin Extra Strength Dolobid Moblgesic Ticlid  Ascriptin with  Codeine Doloprin or Doloprin with Codeine Momentum Tolectin  Asperbuf Duoprin Mono-gesic  Trendar  Aspergum Duradyne Motrin or Motrin IB Triminicin  Aspirin plain, buffered or enteric coated Durasal Myochrisine Trigesic  Aspirin Suppositories Easprin Nalfon Trillsate  Aspirin with Codeine Ecotrin Regular or Extra Strength Naprosyn Uracel  Atromid-S Efficin Naproxen Ursinus  Auranofin Capsules Elmiron Neocylate Vanquish  Axotal Emagrin Norgesic Verin  Azathioprine Empirin or Empirin with Codeine Normiflo Vitamin E  Azolid Emprazil Nuprin Voltaren  Bayer Aspirin plain, buffered or children's or timed BC Tablets or powders Encaprin Orgaran Warfarin Sodium  Buff-a-Comp Enoxaparin Orudis Zorpin  Buff-a-Comp with Codeine Equegesic Os-Cal-Gesic   Buffaprin Excedrin plain, buffered or Extra Strength Oxalid   Bufferin Arthritis Strength Feldene Oxphenbutazone   Bufferin plain or Extra Strength Feldene Capsules Oxycodone with Aspirin   Bufferin with Codeine Fenoprofen Fenoprofen Pabalate or Pabalate-SF   Buffets II Flogesic Panagesic   Buffinol plain or Extra Strength Florinal or Florinal with Codeine Panwarfarin   Buf-Tabs Flurbiprofen Penicillamine   Butalbital Compound Four-way cold tablets Penicillin   Butazolidin Fragmin Pepto-Bismol   Carbenicillin Geminisyn Percodan   Carna Arthritis Reliever Geopen Persantine   Carprofen Gold's salt Persistin   Chloramphenicol Goody's Phenylbutazone   Chloromycetin Haltrain Piroxlcam   Clmetidine heparin Plaquenil   Cllnoril Hyco-pap Ponstel   Clofibrate Hydroxy chloroquine Propoxyphen         Before stopping any of these medications, be sure to consult the physician who ordered them.  Some, such as Coumadin (Warfarin) are ordered to prevent or treat serious conditions such as "deep thrombosis", "pumonary embolisms", and other heart problems.  The amount of time that you may need off of the medication may also vary with the medication and the reason for which you were taking it.  If you are taking any of these medications, please make sure  you notify your pain physician before you undergo any procedures.         Epidural Steroid Injection Patient Information  Description: The epidural space surrounds the nerves as they exit the spinal cord.  In some patients, the nerves can be compressed and inflamed by a bulging disc or a tight spinal canal (spinal stenosis).  By injecting steroids into the epidural space, we can bring irritated nerves into direct contact with a potentially helpful medication.  These steroids act directly on the irritated nerves and can reduce swelling and inflammation which often leads to decreased pain.  Epidural steroids may be injected anywhere along the spine and from the neck to the low back depending upon the location of your pain.   After numbing the skin with local anesthetic (like Novocaine), a small needle is passed into the epidural space slowly.  You may experience a sensation of pressure while this is being done.  The entire block usually last less than 10 minutes.  Conditions which may be treated by epidural steroids:   Low back and leg pain  Neck and arm pain  Spinal stenosis  Post-laminectomy syndrome  Herpes zoster (shingles) pain  Pain from compression fractures  Preparation for the injection:  1. Do not eat any solid food or dairy products within 8 hours of your appointment.  2. You may drink clear liquids up to 3 hours before appointment.  Clear liquids include water, black coffee, juice or soda.  No milk or cream please. 3. You may take your regular medication, including pain medications, with a sip of water before your appointment  Diabetics should hold regular insulin (if taken  separately) and take 1/2 normal NPH dos the morning of the procedure.  Carry some sugar containing items with you to your appointment. 4. A driver must accompany you and be prepared to drive you home after your procedure.  5. Bring all your current medications with your. 6. An IV may be inserted and  sedation may be given at the discretion of the physician.   7. A blood pressure cuff, EKG and other monitors will often be applied during the procedure.  Some patients may need to have extra oxygen administered for a short period. 8. You will be asked to provide medical information, including your allergies, prior to the procedure.  We must know immediately if you are taking blood thinners (like Coumadin/Warfarin)  Or if you are allergic to IV iodine contrast (dye). We must know if you could possible be pregnant.  Possible side-effects:  Bleeding from needle site  Infection (rare, may require surgery)  Nerve injury (rare)  Numbness & tingling (temporary)  Difficulty urinating (rare, temporary)  Spinal headache ( a headache worse with upright posture)  Light -headedness (temporary)  Pain at injection site (several days)  Decreased blood pressure (temporary)  Weakness in arm/leg (temporary)  Pressure sensation in back/neck (temporary)  Call if you experience:  Fever/chills associated with headache or increased back/neck pain.  Headache worsened by an upright position.  New onset weakness or numbness of an extremity below the injection site  Hives or difficulty breathing (go to the emergency room)  Inflammation or drainage at the infection site  Severe back/neck pain  Any new symptoms which are concerning to you  Please note:  Although the local anesthetic injected can often make your back or neck feel good for several hours after the injection, the pain will likely return.  It takes 3-7 days for steroids to work in the epidural space.  You may not notice any pain relief for at least that one week.  If effective, we will often do a series of three injections spaced 3-6 weeks apart to maximally decrease your pain.  After the initial series, we generally will wait several months before considering a repeat injection of the same type.  If you have any questions, please  call [phone] [facility]

## 2017-08-25 NOTE — Patient Instructions (Addendum)
Rx for Zanaflex has been escribed to your pharmacy.  GENERAL RISKS AND COMPLICATIONS  What are the risk, side effects and possible complications? Generally speaking, most procedures are safe.  However, with any procedure there are risks, side effects, and the possibility of complications.  The risks and complications are dependent upon the sites that are lesioned, or the type of nerve block to be performed.  The closer the procedure is to the spine, the more serious the risks are.  Great care is taken when placing the radio frequency needles, block needles or lesioning probes, but sometimes complications can occur. 1. Infection: Any time there is an injection through the skin, there is a risk of infection.  This is why sterile conditions are used for these blocks.  There are four possible types of infection. 1. Localized skin infection. 2. Central Nervous System Infection-This can be in the form of Meningitis, which can be deadly. 3. Epidural Infections-This can be in the form of an epidural abscess, which can cause pressure inside of the spine, causing compression of the spinal cord with subsequent paralysis. This would require an emergency surgery to decompress, and there are no guarantees that the patient would recover from the paralysis. 4. Discitis-This is an infection of the intervertebral discs.  It occurs in about 1% of discography procedures.  It is difficult to treat and it may lead to surgery.        2. Pain: the needles have to go through skin and soft tissues, will cause soreness.       3. Damage to internal structures:  The nerves to be lesioned may be near blood vessels or    other nerves which can be potentially damaged.       4. Bleeding: Bleeding is more common if the patient is taking blood thinners such as  aspirin, Coumadin, Ticiid, Plavix, etc., or if he/she have some genetic predisposition  such as hemophilia. Bleeding into the spinal canal can cause compression of the spinal   cord with subsequent paralysis.  This would require an emergency surgery to  decompress and there are no guarantees that the patient would recover from the  paralysis.       5. Pneumothorax:  Puncturing of a lung is a possibility, every time a needle is introduced in  the area of the chest or upper back.  Pneumothorax refers to free air around the  collapsed lung(s), inside of the thoracic cavity (chest cavity).  Another two possible  complications related to a similar event would include: Hemothorax and Chylothorax.   These are variations of the Pneumothorax, where instead of air around the collapsed  lung(s), you may have blood or chyle, respectively.       6. Spinal headaches: They may occur with any procedures in the area of the spine.       7. Persistent CSF (Cerebro-Spinal Fluid) leakage: This is a rare problem, but may occur  with prolonged intrathecal or epidural catheters either due to the formation of a fistulous  track or a dural tear.       8. Nerve damage: By working so close to the spinal cord, there is always a possibility of  nerve damage, which could be as serious as a permanent spinal cord injury with  paralysis.       9. Death:  Although rare, severe deadly allergic reactions known as "Anaphylactic  reaction" can occur to any of the medications used.      10. Worsening  of the symptoms:  We can always make thing worse.  What are the chances of something like this happening? Chances of any of this occuring are extremely low.  By statistics, you have more of a chance of getting killed in a motor vehicle accident: while driving to the hospital than any of the above occurring .  Nevertheless, you should be aware that they are possibilities.  In general, it is similar to taking a shower.  Everybody knows that you can slip, hit your head and get killed.  Does that mean that you should not shower again?  Nevertheless always keep in mind that statistics do not mean anything if you happen to be on  the wrong side of them.  Even if a procedure has a 1 (one) in a 1,000,000 (million) chance of going wrong, it you happen to be that one..Also, keep in mind that by statistics, you have more of a chance of having something go wrong when taking medications.  Who should not have this procedure? If you are on a blood thinning medication (e.g. Coumadin, Plavix, see list of "Blood Thinners"), or if you have an active infection going on, you should not have the procedure.  If you are taking any blood thinners, please inform your physician.  How should I prepare for this procedure?  Do not eat or drink anything at least six hours prior to the procedure.  Bring a driver with you .  It cannot be a taxi.  Come accompanied by an adult that can drive you back, and that is strong enough to help you if your legs get weak or numb from the local anesthetic.  Take all of your medicines the morning of the procedure with just enough water to swallow them.  If you have diabetes, make sure that you are scheduled to have your procedure done first thing in the morning, whenever possible.  If you have diabetes, take only half of your insulin dose and notify our nurse that you have done so as soon as you arrive at the clinic.  If you are diabetic, but only take blood sugar pills (oral hypoglycemic), then do not take them on the morning of your procedure.  You may take them after you have had the procedure.  Do not take aspirin or any aspirin-containing medications, at least eleven (11) days prior to the procedure.  They may prolong bleeding.  Wear loose fitting clothing that may be easy to take off and that you would not mind if it got stained with Betadine or blood.  Do not wear any jewelry or perfume  Remove any nail coloring.  It will interfere with some of our monitoring equipment.  NOTE: Remember that this is not meant to be interpreted as a complete list of all possible complications.  Unforeseen problems  may occur.  BLOOD THINNERS The following drugs contain aspirin or other products, which can cause increased bleeding during surgery and should not be taken for 2 weeks prior to and 1 week after surgery.  If you should need take something for relief of minor pain, you may take acetaminophen which is found in Tylenol,m Datril, Anacin-3 and Panadol. It is not blood thinner. The products listed below are.  Do not take any of the products listed below in addition to any listed on your instruction sheet.  A.P.C or A.P.C with Codeine Codeine Phosphate Capsules #3 Ibuprofen Ridaura  ABC compound Congesprin Imuran rimadil  Advil Cope Indocin Robaxisal  Alka-Seltzer Effervescent  Pain Reliever and Antacid Coricidin or Coricidin-D  Indomethacin Rufen  Alka-Seltzer plus Cold Medicine Cosprin Ketoprofen S-A-C Tablets  Anacin Analgesic Tablets or Capsules Coumadin Korlgesic Salflex  Anacin Extra Strength Analgesic tablets or capsules CP-2 Tablets Lanoril Salicylate  Anaprox Cuprimine Capsules Levenox Salocol  Anexsia-D Dalteparin Magan Salsalate  Anodynos Darvon compound Magnesium Salicylate Sine-off  Ansaid Dasin Capsules Magsal Sodium Salicylate  Anturane Depen Capsules Marnal Soma  APF Arthritis pain formula Dewitt's Pills Measurin Stanback  Argesic Dia-Gesic Meclofenamic Sulfinpyrazone  Arthritis Bayer Timed Release Aspirin Diclofenac Meclomen Sulindac  Arthritis pain formula Anacin Dicumarol Medipren Supac  Analgesic (Safety coated) Arthralgen Diffunasal Mefanamic Suprofen  Arthritis Strength Bufferin Dihydrocodeine Mepro Compound Suprol  Arthropan liquid Dopirydamole Methcarbomol with Aspirin Synalgos  ASA tablets/Enseals Disalcid Micrainin Tagament  Ascriptin Doan's Midol Talwin  Ascriptin A/D Dolene Mobidin Tanderil  Ascriptin Extra Strength Dolobid Moblgesic Ticlid  Ascriptin with Codeine Doloprin or Doloprin with Codeine Momentum Tolectin  Asperbuf Duoprin Mono-gesic Trendar  Aspergum  Duradyne Motrin or Motrin IB Triminicin  Aspirin plain, buffered or enteric coated Durasal Myochrisine Trigesic  Aspirin Suppositories Easprin Nalfon Trillsate  Aspirin with Codeine Ecotrin Regular or Extra Strength Naprosyn Uracel  Atromid-S Efficin Naproxen Ursinus  Auranofin Capsules Elmiron Neocylate Vanquish  Axotal Emagrin Norgesic Verin  Azathioprine Empirin or Empirin with Codeine Normiflo Vitamin E  Azolid Emprazil Nuprin Voltaren  Bayer Aspirin plain, buffered or children's or timed BC Tablets or powders Encaprin Orgaran Warfarin Sodium  Buff-a-Comp Enoxaparin Orudis Zorpin  Buff-a-Comp with Codeine Equegesic Os-Cal-Gesic   Buffaprin Excedrin plain, buffered or Extra Strength Oxalid   Bufferin Arthritis Strength Feldene Oxphenbutazone   Bufferin plain or Extra Strength Feldene Capsules Oxycodone with Aspirin   Bufferin with Codeine Fenoprofen Fenoprofen Pabalate or Pabalate-SF   Buffets II Flogesic Panagesic   Buffinol plain or Extra Strength Florinal or Florinal with Codeine Panwarfarin   Buf-Tabs Flurbiprofen Penicillamine   Butalbital Compound Four-way cold tablets Penicillin   Butazolidin Fragmin Pepto-Bismol   Carbenicillin Geminisyn Percodan   Carna Arthritis Reliever Geopen Persantine   Carprofen Gold's salt Persistin   Chloramphenicol Goody's Phenylbutazone   Chloromycetin Haltrain Piroxlcam   Clmetidine heparin Plaquenil   Cllnoril Hyco-pap Ponstel   Clofibrate Hydroxy chloroquine Propoxyphen         Before stopping any of these medications, be sure to consult the physician who ordered them.  Some, such as Coumadin (Warfarin) are ordered to prevent or treat serious conditions such as "deep thrombosis", "pumonary embolisms", and other heart problems.  The amount of time that you may need off of the medication may also vary with the medication and the reason for which you were taking it.  If you are taking any of these medications, please make sure you notify your pain  physician before you undergo any procedures.         Epidural Steroid Injection Patient Information  Description: The epidural space surrounds the nerves as they exit the spinal cord.  In some patients, the nerves can be compressed and inflamed by a bulging disc or a tight spinal canal (spinal stenosis).  By injecting steroids into the epidural space, we can bring irritated nerves into direct contact with a potentially helpful medication.  These steroids act directly on the irritated nerves and can reduce swelling and inflammation which often leads to decreased pain.  Epidural steroids may be injected anywhere along the spine and from the neck to the low back depending upon the location of your pain.   After numbing the  skin with local anesthetic (like Novocaine), a small needle is passed into the epidural space slowly.  You may experience a sensation of pressure while this is being done.  The entire block usually last less than 10 minutes.  Conditions which may be treated by epidural steroids:   Low back and leg pain  Neck and arm pain  Spinal stenosis  Post-laminectomy syndrome  Herpes zoster (shingles) pain  Pain from compression fractures  Preparation for the injection:  1. Do not eat any solid food or dairy products within 8 hours of your appointment.  2. You may drink clear liquids up to 3 hours before appointment.  Clear liquids include water, black coffee, juice or soda.  No milk or cream please. 3. You may take your regular medication, including pain medications, with a sip of water before your appointment  Diabetics should hold regular insulin (if taken separately) and take 1/2 normal NPH dos the morning of the procedure.  Carry some sugar containing items with you to your appointment. 4. A driver must accompany you and be prepared to drive you home after your procedure.  5. Bring all your current medications with your. 6. An IV may be inserted and sedation may be given  at the discretion of the physician.   7. A blood pressure cuff, EKG and other monitors will often be applied during the procedure.  Some patients may need to have extra oxygen administered for a short period. 8. You will be asked to provide medical information, including your allergies, prior to the procedure.  We must know immediately if you are taking blood thinners (like Coumadin/Warfarin)  Or if you are allergic to IV iodine contrast (dye). We must know if you could possible be pregnant.  Possible side-effects:  Bleeding from needle site  Infection (rare, may require surgery)  Nerve injury (rare)  Numbness & tingling (temporary)  Difficulty urinating (rare, temporary)  Spinal headache ( a headache worse with upright posture)  Light -headedness (temporary)  Pain at injection site (several days)  Decreased blood pressure (temporary)  Weakness in arm/leg (temporary)  Pressure sensation in back/neck (temporary)  Call if you experience:  Fever/chills associated with headache or increased back/neck pain.  Headache worsened by an upright position.  New onset weakness or numbness of an extremity below the injection site  Hives or difficulty breathing (go to the emergency room)  Inflammation or drainage at the infection site  Severe back/neck pain  Any new symptoms which are concerning to you  Please note:  Although the local anesthetic injected can often make your back or neck feel good for several hours after the injection, the pain will likely return.  It takes 3-7 days for steroids to work in the epidural space.  You may not notice any pain relief for at least that one week.  If effective, we will often do a series of three injections spaced 3-6 weeks apart to maximally decrease your pain.  After the initial series, we generally will wait several months before considering a repeat injection of the same type.  If you have any questions, please call [phone] [facility]

## 2017-09-12 ENCOUNTER — Ambulatory Visit
Admission: RE | Admit: 2017-09-12 | Discharge: 2017-09-12 | Disposition: A | Discharge status: Home / Self Care | Payer: BLUE CROSS/BLUE SHIELD | Source: Ambulatory Visit | Attending: Student in an Organized Health Care Education/Training Program | Admitting: Student in an Organized Health Care Education/Training Program

## 2017-09-12 ENCOUNTER — Ambulatory Visit (HOSPITAL_BASED_OUTPATIENT_CLINIC_OR_DEPARTMENT_OTHER): Payer: BLUE CROSS/BLUE SHIELD | Admitting: Student in an Organized Health Care Education/Training Program

## 2017-09-12 ENCOUNTER — Encounter: Payer: Self-pay | Admitting: Student in an Organized Health Care Education/Training Program

## 2017-09-12 VITALS — BP 141/81 | HR 71 | Temp 97.6°F | Resp 10 | Ht 72.0 in | Wt 205.0 lb

## 2017-09-12 DIAGNOSIS — M5416 Radiculopathy, lumbar region: Secondary | ICD-10-CM | POA: Diagnosis not present

## 2017-09-12 DIAGNOSIS — G894 Chronic pain syndrome: Secondary | ICD-10-CM | POA: Insufficient documentation

## 2017-09-12 DIAGNOSIS — Z981 Arthrodesis status: Secondary | ICD-10-CM

## 2017-09-12 DIAGNOSIS — M961 Postlaminectomy syndrome, not elsewhere classified: Secondary | ICD-10-CM | POA: Insufficient documentation

## 2017-09-12 DIAGNOSIS — M5489 Other dorsalgia: Secondary | ICD-10-CM | POA: Diagnosis present

## 2017-09-12 MED ORDER — LACTATED RINGERS IV SOLN
1000.0000 mL | Freq: Once | INTRAVENOUS | Status: AC
  Administered 2017-09-12: 1000 mL via INTRAVENOUS

## 2017-09-12 MED ORDER — FENTANYL CITRATE (PF) 100 MCG/2ML IJ SOLN
25.0000 ug | INTRAMUSCULAR | Status: DC | PRN
  Administered 2017-09-12: 50 ug via INTRAVENOUS
  Filled 2017-09-12: qty 2

## 2017-09-12 MED ORDER — DEXAMETHASONE SODIUM PHOSPHATE 10 MG/ML IJ SOLN
10.0000 mg | Freq: Once | INTRAMUSCULAR | Status: AC
  Administered 2017-09-12: 10 mg
  Filled 2017-09-12: qty 1

## 2017-09-12 MED ORDER — LIDOCAINE HCL (PF) 1 % IJ SOLN
4.5000 mL | Freq: Once | INTRAMUSCULAR | Status: AC
  Administered 2017-09-12: 4.5 mL
  Filled 2017-09-12: qty 5

## 2017-09-12 MED ORDER — IOPAMIDOL (ISOVUE-M 200) INJECTION 41%
10.0000 mL | Freq: Once | INTRAMUSCULAR | Status: AC
  Administered 2017-09-12: 10 mL via EPIDURAL
  Filled 2017-09-12: qty 10

## 2017-09-12 MED ORDER — ROPIVACAINE HCL 2 MG/ML IJ SOLN
2.0000 mL | Freq: Once | INTRAMUSCULAR | Status: AC
  Administered 2017-09-12: 2 mL via EPIDURAL
  Filled 2017-09-12: qty 10

## 2017-09-12 MED ORDER — SODIUM CHLORIDE 0.9% FLUSH
2.0000 mL | Freq: Once | INTRAVENOUS | Status: AC
  Administered 2017-09-12: 2 mL

## 2017-09-12 NOTE — Progress Notes (Signed)
Safety precautions to be maintained throughout the outpatient stay will include: orient to surroundings, keep bed in low position, maintain call bell within reach at all times, provide assistance with transfer out of bed and ambulation.  

## 2017-09-12 NOTE — Progress Notes (Signed)
Patient's Name: Wesley Adams  MRN: [id number]  Referring Provider: Steele Sizer, MD  DOB: 1966/03/29  PCP: Steele Sizer, MD  DOS: 09/12/2017  Note by: Gillis Santa, MD  Service setting: Ambulatory outpatient  Specialty: Interventional Pain Management  Patient type: Established  Location: [facility] (AMB) Pain Management Facility  Visit type: Interventional Procedure   Primary Reason for Visit: Interventional Pain Management Treatment. CC: Back Pain (lower bilateral)  Procedure:  Anesthesia, Analgesia, Anxiolysis:  Type: Diagnostic Epidural Steroid Injection Region: Caudal Level: Sacrococcygeal   Laterality: Midline aiming at the left  Type: Moderate (Conscious) Sedation combined with Local Anesthesia Indication(s): Analgesia and Anxiety Route: Intravenous (IV) IV Access: Secured Sedation: Meaningful verbal contact was maintained at all times during the procedure  Local Anesthetic: Lidocaine 1%   Indications: 1. Lumbar radiculopathy (L>R)   2. Failed back surgical syndrome   3. History of lumbar fusion (L4/5)   4. Chronic pain syndrome    Pain Score: Pre-procedure: 9 /10 Post-procedure: 5 /10  Pre-op Assessment:  Wesley Adams is a 52 y.o. (year old), male patient, seen today for interventional treatment. He  has a past surgical history that includes Back surgery (Feb 2016); Spinal cord stimulator insertion (03/23/2017); Colonoscopy with propofol (N/A, 06/01/2017); Esophagogastroduodenoscopy (egd) with propofol (06/01/2017); polypectomy (06/01/2017); Spinal cord stimulator removal (N/A, 06/16/2017); and Fracture surgery (Right, as child). Wesley Adams has a current medication list which includes the following prescription(s): omeprazole, tizanidine, hydrocodone-acetaminophen, ondansetron, and ranitidine, and the following Facility-Administered Medications: fentanyl and lactated ringers. His primarily concern today is the Back Pain (lower bilateral)  Initial Vital Signs:  Pulse/HCG Rate:  68ECG Heart Rate: 63 Temp: 97.6 F (36.4 C) Resp: 16 BP: 140/79 SpO2: 100 %  BMI: Estimated body mass index is 27.8 kg/m as calculated from the following:   Height as of this encounter: 6' (1.829 m).   Weight as of this encounter: 205 lb (93 kg).  Risk Assessment: Allergies: Reviewed. He has No Known Allergies.  Allergy Precautions: None required Coagulopathies: Reviewed. None identified.  Blood-thinner therapy: None at this time Active Infection(s): Reviewed. None identified. Wesley Adams is afebrile  Site Confirmation: Wesley Adams was asked to confirm the procedure and laterality before marking the site Procedure checklist: Completed Consent: Before the procedure and under the influence of no sedative(s), amnesic(s), or anxiolytics, the patient was informed of the treatment options, risks and possible complications. To fulfill our ethical and legal obligations, as recommended by the American Medical Association's Code of Ethics, I have informed the patient of my clinical impression; the nature and purpose of the treatment or procedure; the risks, benefits, and possible complications of the intervention; the alternatives, including doing nothing; the risk(s) and benefit(s) of the alternative treatment(s) or procedure(s); and the risk(s) and benefit(s) of doing nothing. The patient was provided information about the general risks and possible complications associated with the procedure. These may include, but are not limited to: failure to achieve desired goals, infection, bleeding, organ or nerve damage, allergic reactions, paralysis, and death. In addition, the patient was informed of those risks and complications associated to Spine-related procedures, such as failure to decrease pain; infection (i.e.: Meningitis, epidural or intraspinal abscess); bleeding (i.e.: epidural hematoma, subarachnoid hemorrhage, or any other type of intraspinal or peri-dural bleeding); organ or nerve damage  (i.e.: Any type of peripheral nerve, nerve root, or spinal cord injury) with subsequent damage to sensory, motor, and/or autonomic systems, resulting in permanent pain, numbness, and/or weakness of one or several areas of the body;  allergic reactions; (i.e.: anaphylactic reaction); and/or death. Furthermore, the patient was informed of those risks and complications associated with the medications. These include, but are not limited to: allergic reactions (i.e.: anaphylactic or anaphylactoid reaction(s)); adrenal axis suppression; blood sugar elevation that in diabetics may result in ketoacidosis or comma; water retention that in patients with history of congestive heart failure may result in shortness of breath, pulmonary edema, and decompensation with resultant heart failure; weight gain; swelling or edema; medication-induced neural toxicity; particulate matter embolism and blood vessel occlusion with resultant organ, and/or nervous system infarction; and/or aseptic necrosis of one or more joints. Finally, the patient was informed that Medicine is not an exact science; therefore, there is also the possibility of unforeseen or unpredictable risks and/or possible complications that may result in a catastrophic outcome. The patient indicated having understood very clearly. We have given the patient no guarantees and we have made no promises. Enough time was given to the patient to ask questions, all of which were answered to the patient's satisfaction. Wesley Adams has indicated that he wanted to continue with the procedure. Attestation: I, the ordering provider, attest that I have discussed with the patient the benefits, risks, side-effects, alternatives, likelihood of achieving goals, and potential problems during recovery for the procedure that I have provided informed consent. Date  Time: 09/12/2017  8:07 AM  Pre-Procedure Preparation:  Monitoring: As per clinic protocol. Respiration, ETCO2, SpO2, BP, heart  rate and rhythm monitor placed and checked for adequate function Safety Precautions: Patient was assessed for positional comfort and pressure points before starting the procedure. Time-out: I initiated and conducted the "Time-out" before starting the procedure, as per protocol. The patient was asked to participate by confirming the accuracy of the "Time Out" information. Verification of the correct person, site, and procedure were performed and confirmed by me, the nursing staff, and the patient. "Time-out" conducted as per Joint Commission's Universal Protocol (UP.01.01.01). Time: 0913  Description of Procedure Process:   Position: Prone Target Area: Caudal Epidural Canal. Approach: Midline approach. Area Prepped: Entire Posterior Sacrococcygeal Region Prepping solution: ChloraPrep (2% chlorhexidine gluconate and 70% isopropyl alcohol) Safety Precautions: Aspiration looking for blood return was conducted prior to all injections. At no point did we inject any substances, as a needle was being advanced. No attempts were made at seeking any paresthesias. Safe injection practices and needle disposal techniques used. Medications properly checked for expiration dates. SDV (single dose vial) medications used. Description of the Procedure: Protocol guidelines were followed. The patient was placed in position over the fluoroscopy table. The target area was identified and the area prepped in the usual manner. Skin desensitized using vapocoolant spray. Skin & deeper tissues infiltrated with local anesthetic. Appropriate amount of time allowed to pass for local anesthetics to take effect. The procedure needles were then advanced to the target area. Proper needle placement secured. Negative aspiration confirmed. Solution injected in intermittent fashion, asking for systemic symptoms every 0.5cc of injectate. The needles were then removed and the area cleansed, making sure to leave some of the prepping solution back  to take advantage of its long term bactericidal properties. Vitals:   09/12/17 0920 09/12/17 0927 09/12/17 0938 09/12/17 0948  BP: (!) 143/94 (!) 146/86 (!) 148/80 (!) 141/81  Pulse: 71     Resp: 20 18 18 10   Temp:      TempSrc:      SpO2: 96% 99% 99% 98%  Weight:      Height:  Start Time: 0913 hrs. End Time: 0918 hrs. Materials:  Needle(s) Type: Epidural needle Gauge: 17G Length: 3.5-in Medication(s): Please see orders for medications and dosing details. 8 CC solution made of 5 cc of preservative-free saline, 2 cc of 0.2% ropivacaine, 1 cc of Decadron 10 mg/cc. Imaging Guidance (Spinal):  Type of Imaging Technique: Fluoroscopy Guidance (Spinal) Indication(s): Assistance in needle guidance and placement for procedures requiring needle placement in or near specific anatomical locations not easily accessible without such assistance. Exposure Time: Please see nurses notes. Contrast: Before injecting any contrast, we confirmed that the patient did not have an allergy to iodine, shellfish, or radiological contrast. Once satisfactory needle placement was completed at the desired level, radiological contrast was injected. Contrast injected under live fluoroscopy. No contrast complications. See chart for type and volume of contrast used. Fluoroscopic Guidance: I was personally present during the use of fluoroscopy. "Tunnel Vision Technique" used to obtain the best possible view of the target area. Parallax error corrected before commencing the procedure. "Direction-depth-direction" technique used to introduce the needle under continuous pulsed fluoroscopy. Once target was reached, antero-posterior, oblique, and lateral fluoroscopic projection used confirm needle placement in all planes. Images permanently stored in EMR. Interpretation: I personally interpreted the imaging intraoperatively. Adequate needle placement confirmed in multiple planes. Appropriate spread of contrast into desired area  was observed. No evidence of afferent or efferent intravascular uptake. No intrathecal or subarachnoid spread observed. Permanent images saved into the patient's record.  Antibiotic Prophylaxis:   Anti-infectives (From admission, onward)   None     Indication(s): None identified  Post-operative Assessment:  Post-procedure Vital Signs:  Pulse/HCG Rate: 7166 Temp: 97.6 F (36.4 C) Resp: 10 BP: (!) 141/81 SpO2: 98 %  EBL: None  Complications: No immediate post-treatment complications observed by team, or reported by patient.  Note: The patient tolerated the entire procedure well. A repeat set of vitals were taken after the procedure and the patient was kept under observation following institutional policy, for this type of procedure. Post-procedural neurological assessment was performed, showing return to baseline, prior to discharge. The patient was provided with post-procedure discharge instructions, including a section on how to identify potential problems. Should any problems arise concerning this procedure, the patient was given instructions to immediately contact us, at any time, without hesitation. In any case, we plan to contact the patient by telephone for a follow-up status report regarding this interventional procedure.  Comments:  No additional relevant information. 5 out of 5 strength bilateral lower extremity: Plantar flexion, dorsiflexion, knee flexion, knee extension.  Plan of Care    Imaging Orders     DG C-Arm 1-60 Min-No Report Procedure Orders    No procedure(s) ordered today    Medications ordered for procedure: Meds ordered this encounter  Medications  . lactated ringers infusion 1,000 mL  . fentaNYL (SUBLIMAZE) injection 25-100 mcg    Make sure Narcan is available in the pyxis when using this medication. In the event of respiratory depression (RR< 8/min): Titrate NARCAN (naloxone) in increments of 0.1 to 0.2 mg IV at 2-3 minute intervals, until desired  degree of reversal.  . iopamidol (ISOVUE-M) 41 % intrathecal injection 10 mL  . ropivacaine (PF) 2 mg/mL (0.2%) (NAROPIN) injection 2 mL  . sodium chloride flush (NS) 0.9 % injection 2 mL  . dexamethasone (DECADRON) injection 10 mg  . lidocaine (PF) (XYLOCAINE) 1 % injection 4.5 mL   Medications administered: We administered lactated ringers, fentaNYL, iopamidol, ropivacaine (PF) 2 mg/mL (0.2%), sodium chloride  flush, dexamethasone, and lidocaine (PF).  See the medical record for exact dosing, route, and time of administration.  New Prescriptions   No medications on file   Disposition: Discharge home  Discharge Date & Time: 09/12/2017; 0948 hrs.   Physician-requested Follow-up: Return in about 3 weeks (around 10/03/2017) for Post Procedure Evaluation, Medication Management.  Future Appointments  Date Time Provider [place]  10/03/2017 12:30 PM Gillis Santa, MD [facility] None   Primary Care Physician: Steele Sizer, MD Location: [facility] Note by: Gillis Santa, MD Date: 09/12/2017; Time: 10:07 AM  Disclaimer:  Medicine is not an exact science. The only guarantee in medicine is that nothing is guaranteed. It is important to note that the decision to proceed with this intervention was based on the information collected from the patient. The Data and conclusions were drawn from the patient's questionnaire, the interview, and the physical examination. Because the information was provided in large part by the patient, it cannot be guaranteed that it has not been purposely or unconsciously manipulated. Every effort has been made to obtain as much relevant data as possible for this evaluation. It is important to note that the conclusions that lead to this procedure are derived in large part from the available data. Always take into account that the treatment will also be dependent on availability of resources and existing treatment guidelines, considered by  other Pain Management Practitioners as being common knowledge and practice, at the time of the intervention. For Medico-Legal purposes, it is also important to point out that variation in procedural techniques and pharmacological choices are the acceptable norm. The indications, contraindications, technique, and results of the above procedure should only be interpreted and judged by a Board-Certified Interventional Pain Specialist with extensive familiarity and expertise in the same exact procedure and technique.

## 2017-09-13 ENCOUNTER — Telehealth: Payer: Self-pay

## 2017-09-13 NOTE — Telephone Encounter (Signed)
Post procedure phone call.  Patient states he is doing OK.  He is a little sore.  Instructed to put heat today and to call us for any further questions or concerns.

## 2017-10-03 ENCOUNTER — Other Ambulatory Visit: Payer: Self-pay

## 2017-10-03 ENCOUNTER — Other Ambulatory Visit: Payer: Self-pay | Admitting: Nurse Practitioner

## 2017-10-03 ENCOUNTER — Ambulatory Visit
Payer: BLUE CROSS/BLUE SHIELD | Attending: Student in an Organized Health Care Education/Training Program | Admitting: Nurse Practitioner

## 2017-10-03 VITALS — BP 138/86 | HR 58 | Temp 98.1°F | Resp 16 | Ht 73.0 in | Wt 210.0 lb

## 2017-10-03 DIAGNOSIS — R1013 Epigastric pain: Secondary | ICD-10-CM | POA: Insufficient documentation

## 2017-10-03 DIAGNOSIS — M5441 Lumbago with sciatica, right side: Secondary | ICD-10-CM | POA: Diagnosis not present

## 2017-10-03 DIAGNOSIS — G8929 Other chronic pain: Secondary | ICD-10-CM | POA: Insufficient documentation

## 2017-10-03 DIAGNOSIS — M5442 Lumbago with sciatica, left side: Secondary | ICD-10-CM | POA: Insufficient documentation

## 2017-10-03 DIAGNOSIS — M51369 Other intervertebral disc degeneration, lumbar region without mention of lumbar back pain or lower extremity pain: Secondary | ICD-10-CM

## 2017-10-03 DIAGNOSIS — G894 Chronic pain syndrome: Secondary | ICD-10-CM

## 2017-10-03 DIAGNOSIS — Z79899 Other long term (current) drug therapy: Secondary | ICD-10-CM | POA: Diagnosis not present

## 2017-10-03 DIAGNOSIS — F17210 Nicotine dependence, cigarettes, uncomplicated: Secondary | ICD-10-CM | POA: Diagnosis not present

## 2017-10-03 DIAGNOSIS — M5136 Other intervertebral disc degeneration, lumbar region: Secondary | ICD-10-CM | POA: Diagnosis not present

## 2017-10-03 DIAGNOSIS — M545 Low back pain: Secondary | ICD-10-CM | POA: Diagnosis present

## 2017-10-03 DIAGNOSIS — Z79891 Long term (current) use of opiate analgesic: Secondary | ICD-10-CM

## 2017-10-03 DIAGNOSIS — F1190 Opioid use, unspecified, uncomplicated: Secondary | ICD-10-CM

## 2017-10-03 MED ORDER — HYDROCODONE-ACETAMINOPHEN 10-325 MG PO TABS: 1.0000 | ORAL_TABLET | Freq: Four times a day (QID) | ORAL | 0 refills | Status: DC | PRN

## 2017-10-03 NOTE — Progress Notes (Signed)
Safety precautions to be maintained throughout the outpatient stay will include: orient to surroundings, keep bed in low position, maintain call bell within reach at all times, provide assistance with transfer out of bed and ambulation.  

## 2017-10-03 NOTE — Progress Notes (Signed)
Patient's Name: Wesley Adams  MRN: [id number]  Referring Provider: Steele Sizer, MD  DOB: Feb 21, 1966  PCP: Steele Sizer, MD  DOS: 10/03/2017  Note by: Vevelyn Francois NP  Service setting: Ambulatory outpatient  Specialty: Interventional Pain Management  Location: [facility] (AMB) Pain Management Facility    Patient type: Established    Primary Reason(s) for Visit: Encounter for prescription drug management & post-procedure evaluation of chronic illness with mild to moderate exacerbation(Level of risk: moderate) CC: Back Pain (lower)  HPI  [name] is a 52 y.o. year old, male patient, who comes today for a post-procedure evaluation and medication management. He has Lumbar degenerative disc disease; Acute low back pain without sciatica; Spinal cord stimulator status; Chronic, continuous use of opioids; Tobacco use; Epigastric pain; Special screening for malignant neoplasms, colon; Chronic bilateral low back pain with bilateral sciatica; Chronic pain syndrome; and Opiate use on their problem list. His primarily concern today is the Back Pain (lower)  Pain Assessment: Location: Right, Left Back Radiating: hips/buttocks bilaterally down back of leg  to behind knee Onset: More than a month ago Duration: Chronic pain Quality: Discomfort, Constant, Aching, Burning, Numbness, Nagging Severity: 9 /10 (subjective, self-reported pain score)  Note: Reported level is compatible with observation. Clinically the patient looks like a 2/10 A 2/10 is viewed as "Mild to Moderate" and described as noticeable and distracting. Impossible to hide from other people. More frequent flare-ups. Still possible to adapt and function close to normal. It can be very annoying and may have occasional stronger flare-ups. With discipline, patients may get used to it and adapt. Information on the proper use of the pain scale provided to the patient today. When using our objective Pain Scale, levels between 6 and 10/10 are said  to belong in an emergency room, as it progressively worsens from a 6/10, described as severely limiting, requiring emergency care not usually available at an outpatient pain management facility. At a 6/10 level, communication becomes difficult and requires great effort. Assistance to reach the emergency department may be required. Facial flushing and profuse sweating along with potentially dangerous increases in heart rate and blood pressure will be evident. Effect on ADL: difficulty sleeping,prolonged walking or sitting Timing: Constant Modifying factors: MJ, medication, hot BP: 138/86  HR: (!) 71  [name] was last seen on 09/13/2017 for a procedure. During today's appointment we reviewed Mr. Craton post-procedure results, as well as his outpatient medication regimen. He is SP lumbar spine surgery. He is SP SCS which was effective for 60 % effective however  failed secondary to placement issues. He is SP LESI,  he feels like his back pain maybe worse. He admits that his legs get numb with sitting in the legs. He has just pressure in his lower back with burning. He admits that he is not able to sleep. He admits that he works part-time.   Further details on both, my assessment(s), as well as the proposed treatment plan, please see below.  Post-Procedure Assessment  5/20/19Procedure: LESI  Pre-procedure pain score:  9/10 Post-procedure pain score: 5/10         Influential Factors: BMI: 27.71 kg/m Intra-procedural challenges: None observed.         Assessment challenges: None detected.              Reported side-effects: None.        Post-procedural adverse reactions or complications: None reported         Sedation: Please see nurses note. When  no sedatives are used, the analgesic levels obtained are directly associated to the effectiveness of the local anesthetics. However, when sedation is provided, the level of analgesia obtained during the initial 1 hour following the intervention, is  believed to be the result of a combination of factors. These factors may include, but are not limited to: 1. The effectiveness of the local anesthetics used. 2. The effects of the analgesic(s) and/or anxiolytic(s) used. 3. The degree of discomfort experienced by the patient at the time of the procedure. 4. The patients ability and reliability in recalling and recording the events. 5. The presence and influence of possible secondary gains and/or psychosocial factors. Reported result: Relief experienced during the 1st hour after the procedure: 100 % (Ultra-Short Term Relief)            Interpretative annotation: Clinically appropriate result. Analgesia during this period is likely to be Local Anesthetic and/or IV Sedative (Analgesic/Anxiolytic) related.          Effects of local anesthetic: The analgesic effects attained during this period are directly associated to the localized infiltration of local anesthetics and therefore cary significant diagnostic value as to the etiological location, or anatomical origin, of the pain. Expected duration of relief is directly dependent on the pharmacodynamics of the local anesthetic used. Long-acting (4-6 hours) anesthetics used.  Reported result: Relief during the next 4 to 6 hour after the procedure: 70 % (Short-Term Relief)            Interpretative annotation: Clinically appropriate result. Analgesia during this period is likely to be Local Anesthetic-related.          Long-term benefit: Defined as the period of time past the expected duration of local anesthetics (1 hour for short-acting and 4-6 hours for long-acting). With the possible exception of prolonged sympathetic blockade from the local anesthetics, benefits during this period are typically attributed to, or associated with, other factors such as analgesic sensory neuropraxia, antiinflammatory effects, or beneficial biochemical changes provided by agents other than the local anesthetics.  Reported  result: Extended relief following procedure: 0 % (Long-Term Relief)            Interpretative annotation: Clinically appropriate result. Good relief. No permanent benefit expected. Inflammation plays a part in the etiology to the pain.          Current benefits: Defined as reported results that persistent at this point in time.   Analgesia: 0 %            Function: Back to baseline maybe worse ROM: Back to baseline maybe worse Interpretative annotation: Recurrence of symptoms.            Interpretation: Results would suggest failure of therapy in achieving desired goal(s).                  Plan:  Please see "Plan of Care" for details.                Laboratory Chemistry  Inflammation Markers (CRP: Acute Phase) (ESR: Chronic Phase) No results found for: CRP, ESRSEDRATE, LATICACIDVEN                       Rheumatology Markers No results found for: RF, ANA, LABURIC, URICUR, LYMEIGGIGMAB, LYMEABIGMQN, HLAB27                      Renal Function Markers Lab Results  Component Value Date   BUN 11 08/02/2017  CREATININE 0.88 27/09/2374   BCR NOT APPLICABLE 28/31/5176   GFRAA >60 08/02/2017   GFRNONAA >60 08/02/2017                             Hepatic Function Markers Lab Results  Component Value Date   AST 27 08/02/2017   ALT 23 08/02/2017   ALBUMIN 4.6 08/02/2017   ALKPHOS 75 08/02/2017   LIPASE 28 08/02/2017                        Electrolytes Lab Results  Component Value Date   NA 137 08/02/2017   K 3.9 08/02/2017   CL 106 08/02/2017   CALCIUM 9.4 08/02/2017                        Neuropathy Markers Lab Results  Component Value Date   HIV NONREACTIVE 01/07/2016                        Bone Pathology Markers No results found for: [place], [id number], [id number], [id number], 25OHVITD1, 25OHVITD2, 25OHVITD3, TESTOFREE, TESTOSTERONE                       Coagulation Parameters Lab Results  Component Value Date   PLT 189 08/02/2017                         Cardiovascular Markers Lab Results  Component Value Date   HGB 16.7 08/02/2017   HCT 50.5 08/02/2017                         CA Markers No results found for: CEA, CA125, LABCA2                      Note: Lab results reviewed.  Recent Diagnostic Imaging Results  DG C-Arm 1-60 Min-No Report Fluoroscopy was utilized by the requesting physician.  No radiographic  interpretation.   Complexity Note: Imaging results reviewed. Results shared with Mr. Ransford, using Layman's terms.                         Meds   Current Outpatient Medications:  .  omeprazole (PRILOSEC) 40 MG capsule, TAKE 1 CAPSULE (40 MG TOTAL) BY MOUTH 2 (TWO) TIMES DAILY BEFORE A MEAL., Disp: 180 capsule, Rfl: 0 .  tiZANidine (ZANAFLEX) 4 MG tablet, Take 1 tablet (4 mg total) by mouth every 8 (eight) hours as needed for muscle spasms., Disp: 90 tablet, Rfl: 1 .  HYDROcodone-acetaminophen (NORCO) 10-325 MG tablet, Take 1 tablet by mouth every 6 (six) hours as needed for severe pain., Disp: 120 tablet, Rfl: 0  ROS  Constitutional: Denies any fever or chills Gastrointestinal: No reported hemesis, hematochezia, vomiting, or acute GI distress Musculoskeletal: Denies any acute onset joint swelling, redness, loss of ROM, or weakness Neurological: No reported episodes of acute onset apraxia, aphasia, dysarthria, agnosia, amnesia, paralysis, loss of coordination, or loss of consciousness  Allergies  Mr. Mamaril has No Known Allergies.  [place]  Drug: Mr. Groleau  reports that he does not use drugs. Alcohol:  reports that he drinks about 1.8 oz of alcohol per week. Tobacco:  reports that he has been smoking cigarettes.  He has a 25.00 pack-year smoking history. He has never  used smokeless tobacco. Medical:  has a past medical history of Chronic back pain. Surgical: Mr. Gelpi  has a past surgical history that includes Back surgery (Feb 2016); Spinal cord stimulator insertion (03/23/2017); Colonoscopy with propofol  (N/A, 06/01/2017); Esophagogastroduodenoscopy (egd) with propofol (06/01/2017); polypectomy (06/01/2017); Spinal cord stimulator removal (N/A, 06/16/2017); and Fracture surgery (Right, as child). Family: family history includes Alcohol abuse in his mother; Heart attack in his father; Heart disease in his father; Obesity in his father.  Constitutional Exam  General appearance: Well nourished, well developed, and well hydrated. In no apparent acute distress Vitals:   10/03/17 1325  BP: 138/86  Pulse: (!) 58  Resp: 16  Temp: 98.1 F (36.7 C)  SpO2: 99%  Weight: 210 lb (95.3 kg)  Height: 6' 1"  (1.854 m)   BMI Assessment: Estimated body mass index is 27.71 kg/m as calculated from the following:   Height as of this encounter: 6' 1"  (1.854 m).   Weight as of this encounter: 210 lb (95.3 kg). Psych/Mental status: Alert, oriented x 3 (person, place, & time)       Eyes: PERLA Respiratory: No evidence of acute respiratory distress   Thoracic Spine Area Exam  Skin & Axial Inspection: Well healed scar from previous spine surgery detected Alignment: Symmetrical Functional ROM: Unrestricted ROM Stability: No instability detected Muscle Tone/Strength: Functionally intact. No obvious neuro-muscular anomalies detected. Sensory (Neurological): Unimpaired Muscle strength & Tone: No palpable anomalies  Lumbar Spine Area Exam  Skin & Axial Inspection: Well healed scar from previous spine surgery detected Alignment: Symmetrical Functional ROM: Pain restricted ROM       Stability: No instability detected Muscle Tone/Strength: Functionally intact. No obvious neuro-muscular anomalies detected. Sensory (Neurological): Unimpaired Palpation: Complains of area being tender to palpation       Provocative Tests: Lumbar Hyperextension/rotation test: Unable to perform       Lumbar quadrant test (Kemp's test): deferred today       Lumbar Lateral bending test: Unable to perform       Patrick's Maneuver: Unable to  perform                   FABER test: deferred today       Thigh-thrust test: deferred today       S-I compression test: deferred today       S-I distraction test: deferred today        Gait & Posture Assessment  Ambulation: Unassisted Gait: Relatively normal for age and body habitus Posture: WNL   Lower Extremity Exam    Side: Right lower extremity  Side: Left lower extremity  Stability: No instability observed          Stability: No instability observed          Skin & Extremity Inspection: Skin color, temperature, and hair growth are WNL. No peripheral edema or cyanosis. No masses, redness, swelling, asymmetry, or associated skin lesions. No contractures.  Skin & Extremity Inspection: Skin color, temperature, and hair growth are WNL. No peripheral edema or cyanosis. No masses, redness, swelling, asymmetry, or associated skin lesions. No contractures.  Functional ROM: Unrestricted ROM                  Functional ROM: Unrestricted ROM                  Muscle Tone/Strength: Functionally intact. No obvious neuro-muscular anomalies detected.  Muscle Tone/Strength: Functionally intact. No obvious neuro-muscular anomalies detected.  Sensory (Neurological): Unimpaired  Sensory (Neurological): Unimpaired  Palpation: No palpable anomalies  Palpation: No palpable anomalies   Assessment  Primary Diagnosis & Pertinent Problem List: The primary encounter diagnosis was Chronic bilateral low back pain with bilateral sciatica. Diagnoses of Chronic pain syndrome, Lumbar degenerative disc disease, Opiate use, and Long term prescription opiate use were also pertinent to this visit.  Status Diagnosis  Controlled Controlled Controlled 1. Chronic bilateral low back pain with bilateral sciatica   2. Chronic pain syndrome   3. Lumbar degenerative disc disease   4. Opiate use   5. Long term prescription opiate use     Problems updated and reviewed during this visit: Problem  Chronic Bilateral Low Back  Pain With Bilateral Sciatica  Chronic Pain Syndrome  Opiate Use   Plan of Care  Pharmacotherapy (Medications Ordered): Meds ordered this encounter  Medications  . HYDROcodone-acetaminophen (NORCO) 10-325 MG tablet    Sig: Take 1 tablet by mouth every 6 (six) hours as needed for severe pain.    Dispense:  120 tablet    Refill:  0    Do not place this medication, or any other prescription from our practice, on "Automatic Refill". Patient may have prescription filled one day early if pharmacy is closed on scheduled refill date. Do not fill until:  To last until:    Order Specific Question:   Supervising Provider    Answer:   Milinda Pointer [phone]   New Prescriptions   HYDROCODONE-ACETAMINOPHEN (NORCO) 10-325 MG TABLET    Take 1 tablet by mouth every 6 (six) hours as needed for severe pain.   Medications administered today: Idalia Needle. Mac[place]en had no medications administered during this visit. Lab-work, procedure(s), and/or referral(s): Orders Placed This Encounter  Procedures  . ToxASSURE Select 13 (MW), Urine  . Ambulatory referral to Physical Therapy   Imaging and/or referral(s): AMB REFERRAL TO PHYSICAL THERAPY  Interventional therapies: Planned, scheduled, and/or pending:   Not at this time.   Considering:   TFF LESI  Bilateral Lumbar Facet   Provider-requested follow-up: Return in about 1 month (around 10/31/2017) for MedMgmt with Me Donella Stade Edison Pace).  Future Appointments  Date Time Provider [place]  11/02/2017  1:30 PM Vevelyn Francois, NP [facility] None   Primary Care Physician: Steele Sizer, MD Location: [facility] Note by: Vevelyn Francois NP Date: 10/03/2017; Time: 3:53 PM  Pain Score Disclaimer: We use the NRS-11 scale. This is a self-reported, subjective measurement of pain severity with only modest accuracy. It is used primarily to identify changes within a particular patient. It must be understood that outpatient  pain scales are significantly less accurate that those used for research, where they can be applied under ideal controlled circumstances with minimal exposure to variables. In reality, the score is likely to be a combination of pain intensity and pain affect, where pain affect describes the degree of emotional arousal or changes in action readiness caused by the sensory experience of pain. Factors such as social and work situation, setting, emotional state, anxiety levels, expectation, and prior pain experience may influence pain perception and show large inter-individual differences that may also be affected by time variables.  Patient instructions provided during this appointment: Patient Instructions  ____________________________________________________________________________________________  Medication Rules  Applies to: All patients receiving prescriptions (written or electronic).  Pharmacy of record: Pharmacy where electronic prescriptions will be sent. If written prescriptions are taken to a different pharmacy, please inform the nursing staff. The pharmacy listed in the electronic medical record  should be the one where you would like electronic prescriptions to be sent.  Prescription refills: Only during scheduled appointments. Applies to both, written and electronic prescriptions.  NOTE: The following applies primarily to controlled substances (Opioid* Pain Medications).   Patient's responsibilities: 1. Pain Pills: Bring all pain pills to every appointment (except for procedure appointments). 2. Pill Bottles: Bring pills in original pharmacy bottle. Always bring newest bottle. Bring bottle, even if empty. 3. Medication refills: You are responsible for knowing and keeping track of what medications you need refilled. The day before your appointment, write a list of all prescriptions that need to be refilled. Bring that list to your appointment and give it to the admitting nurse.  Prescriptions will be written only during appointments. If you forget a medication, it will not be "Called in", "Faxed", or "electronically sent". You will need to get another appointment to get these prescribed. 4. Prescription Accuracy: You are responsible for carefully inspecting your prescriptions before leaving our office. Have the discharge nurse carefully go over each prescription with you, before taking them home. Make sure that your name is accurately spelled, that your address is correct. Check the name and dose of your medication to make sure it is accurate. Check the number of pills, and the written instructions to make sure they are clear and accurate. Make sure that you are given enough medication to last until your next medication refill appointment. 5. Taking Medication: Take medication as prescribed. Never take more pills than instructed. Never take medication more frequently than prescribed. Taking less pills or less frequently is permitted and encouraged, when it comes to controlled substances (written prescriptions).  6. Inform other Doctors: Always inform, all of your healthcare providers, of all the medications you take. 7. Pain Medication from other Providers: You are not allowed to accept any additional pain medication from any other Doctor or Healthcare provider. There are two exceptions to this rule. (see below) In the event that you require additional pain medication, you are responsible for notifying us, as stated below. 8. Medication Agreement: You are responsible for carefully reading and following our Medication Agreement. This must be signed before receiving any prescriptions from our practice. Safely store a copy of your signed Agreement. Violations to the Agreement will result in no further prescriptions. (Additional copies of our Medication Agreement are available upon request.) 9. Laws, Rules, & Regulations: All patients are expected to follow all Federal and [employer],  [employer], Rules, [employer]. Ignorance of the Laws does not constitute a valid excuse. The use of any illegal substances is prohibited. 10. Adopted CDC guidelines & recommendations: Target dosing levels will be at or below 60 MME/day. Use of benzodiazepines** is not recommended.  Exceptions: There are only two exceptions to the rule of not receiving pain medications from other Healthcare Providers. 1. Exception #1 (Emergencies): In the event of an emergency (i.e.: accident requiring emergency care), you are allowed to receive additional pain medication. However, you are responsible for: As soon as you are able, call our office [phone], at any time of the day or night, and leave a message stating your name, the date and nature of the emergency, and the name and dose of the medication prescribed. In the event that your call is answered by a member of our staff, make sure to document and save the date, time, and the name of the person that took your information.  2. Exception #2 (Planned Surgery): In the event that you are scheduled  by another doctor or dentist to have any type of surgery or procedure, you are allowed (for a period no longer than 30 days), to receive additional pain medication, for the acute post-op pain. However, in this case, you are responsible for picking up a copy of our "Post-op Pain Management for Surgeons" handout, and giving it to your surgeon or dentist. This document is available at our office, and does not require an appointment to obtain it. Simply go to our office during business hours (Monday-Thursday from 8:00 AM to 4:00 PM) (Friday 8:00 AM to 12:00 Noon) or if you have a scheduled appointment with [place], prior to your surgery, and ask for it by name. In addition, you will need to provide [place] with your name, name of your surgeon, type of surgery, and date of procedure or surgery.  *Opioid medications include: morphine, codeine, oxycodone, oxymorphone, hydrocodone,  hydromorphone, meperidine, tramadol, tapentadol, buprenorphine, fentanyl, methadone. **Benzodiazepine medications include: diazepam (Valium), alprazolam (Xanax), clonazepam (Klonopine), lorazepam (Ativan), clorazepate (Tranxene), chlordiazepoxide (Librium), estazolam (Prosom), oxazepam (Serax), temazepam (Restoril), triazolam (Halcion) (Last updated: 06/23/2017) ____________________________________________________________________________________________

## 2017-10-03 NOTE — Patient Instructions (Signed)
____________________________________________________________________________________________  Medication Rules  Applies to: All patients receiving prescriptions (written or electronic).  Pharmacy of record: Pharmacy where electronic prescriptions will be sent. If written prescriptions are taken to a different pharmacy, please inform the nursing staff. The pharmacy listed in the electronic medical record should be the one where you would like electronic prescriptions to be sent.  Prescription refills: Only during scheduled appointments. Applies to both, written and electronic prescriptions.  NOTE: The following applies primarily to controlled substances (Opioid* Pain Medications).   Patient's responsibilities: 1. Pain Pills: Bring all pain pills to every appointment (except for procedure appointments). 2. Pill Bottles: Bring pills in original pharmacy bottle. Always bring newest bottle. Bring bottle, even if empty. 3. Medication refills: You are responsible for knowing and keeping track of what medications you need refilled. The day before your appointment, write a list of all prescriptions that need to be refilled. Bring that list to your appointment and give it to the admitting nurse. Prescriptions will be written only during appointments. If you forget a medication, it will not be "Called in", "Faxed", or "electronically sent". You will need to get another appointment to get these prescribed. 4. Prescription Accuracy: You are responsible for carefully inspecting your prescriptions before leaving our office. Have the discharge nurse carefully go over each prescription with you, before taking them home. Make sure that your name is accurately spelled, that your address is correct. Check the name and dose of your medication to make sure it is accurate. Check the number of pills, and the written instructions to make sure they are clear and accurate. Make sure that you are given enough medication to last  until your next medication refill appointment. 5. Taking Medication: Take medication as prescribed. Never take more pills than instructed. Never take medication more frequently than prescribed. Taking less pills or less frequently is permitted and encouraged, when it comes to controlled substances (written prescriptions).  6. Inform other Doctors: Always inform, all of your healthcare providers, of all the medications you take. 7. Pain Medication from other Providers: You are not allowed to accept any additional pain medication from any other Doctor or Healthcare provider. There are two exceptions to this rule. (see below) In the event that you require additional pain medication, you are responsible for notifying us, as stated below. 8. Medication Agreement: You are responsible for carefully reading and following our Medication Agreement. This must be signed before receiving any prescriptions from our practice. Safely store a copy of your signed Agreement. Violations to the Agreement will result in no further prescriptions. (Additional copies of our Medication Agreement are available upon request.) 9. Laws, Rules, & Regulations: All patients are expected to follow all Federal and State Laws, Statutes, Rules, & Regulations. Ignorance of the Laws does not constitute a valid excuse. The use of any illegal substances is prohibited. 10. Adopted CDC guidelines & recommendations: Target dosing levels will be at or below 60 MME/day. Use of benzodiazepines** is not recommended.  Exceptions: There are only two exceptions to the rule of not receiving pain medications from other Healthcare Providers. 1. Exception #1 (Emergencies): In the event of an emergency (i.e.: accident requiring emergency care), you are allowed to receive additional pain medication. However, you are responsible for: As soon as you are able, call our office [phone], at any time of the day or night, and leave a message stating your name, the  date and nature of the emergency, and the name and dose of the medication   prescribed. In the event that your call is answered by a member of our staff, make sure to document and save the date, time, and the name of the person that took your information.  2. Exception #2 (Planned Surgery): In the event that you are scheduled by another doctor or dentist to have any type of surgery or procedure, you are allowed (for a period no longer than 30 days), to receive additional pain medication, for the acute post-op pain. However, in this case, you are responsible for picking up a copy of our "Post-op Pain Management for Surgeons" handout, and giving it to your surgeon or dentist. This document is available at our office, and does not require an appointment to obtain it. Simply go to our office during business hours (Monday-Thursday from 8:00 AM to 4:00 PM) (Friday 8:00 AM to 12:00 Noon) or if you have a scheduled appointment with us, prior to your surgery, and ask for it by name. In addition, you will need to provide us with your name, name of your surgeon, type of surgery, and date of procedure or surgery.  *Opioid medications include: morphine, codeine, oxycodone, oxymorphone, hydrocodone, hydromorphone, meperidine, tramadol, tapentadol, buprenorphine, fentanyl, methadone. **Benzodiazepine medications include: diazepam (Valium), alprazolam (Xanax), clonazepam (Klonopine), lorazepam (Ativan), clorazepate (Tranxene), chlordiazepoxide (Librium), estazolam (Prosom), oxazepam (Serax), temazepam (Restoril), triazolam (Halcion) (Last updated: 06/23/2017) ____________________________________________________________________________________________    

## 2017-10-07 LAB — TOXASSURE SELECT 13 (MW), URINE

## 2017-11-02 ENCOUNTER — Ambulatory Visit: Payer: BLUE CROSS/BLUE SHIELD | Attending: Nurse Practitioner | Admitting: Nurse Practitioner

## 2017-11-02 ENCOUNTER — Encounter: Payer: Self-pay | Admitting: Nurse Practitioner

## 2017-11-02 ENCOUNTER — Other Ambulatory Visit: Payer: Self-pay

## 2017-11-02 VITALS — BP 151/89 | HR 71 | Temp 98.0°F | Resp 18 | Ht 73.0 in | Wt 210.0 lb

## 2017-11-02 DIAGNOSIS — M5136 Other intervertebral disc degeneration, lumbar region: Secondary | ICD-10-CM | POA: Diagnosis not present

## 2017-11-02 DIAGNOSIS — F17210 Nicotine dependence, cigarettes, uncomplicated: Secondary | ICD-10-CM | POA: Diagnosis not present

## 2017-11-02 DIAGNOSIS — M5441 Lumbago with sciatica, right side: Secondary | ICD-10-CM | POA: Diagnosis not present

## 2017-11-02 DIAGNOSIS — G894 Chronic pain syndrome: Secondary | ICD-10-CM | POA: Diagnosis not present

## 2017-11-02 DIAGNOSIS — Z5181 Encounter for therapeutic drug level monitoring: Secondary | ICD-10-CM | POA: Diagnosis present

## 2017-11-02 DIAGNOSIS — Z79891 Long term (current) use of opiate analgesic: Secondary | ICD-10-CM

## 2017-11-02 DIAGNOSIS — G8929 Other chronic pain: Secondary | ICD-10-CM | POA: Diagnosis not present

## 2017-11-02 DIAGNOSIS — M5442 Lumbago with sciatica, left side: Secondary | ICD-10-CM

## 2017-11-02 MED ORDER — HYDROCODONE-ACETAMINOPHEN 10-325 MG PO TABS: 1.0000 | ORAL_TABLET | Freq: Four times a day (QID) | ORAL | 0 refills | Status: DC | PRN

## 2017-11-02 MED ORDER — TIZANIDINE HCL 4 MG PO TABS: 4.0000 mg | ORAL_TABLET | Freq: Three times a day (TID) | ORAL | 1 refills | Status: DC | PRN

## 2017-11-02 NOTE — Progress Notes (Signed)
Nursing Pain Medication Assessment:  Safety precautions to be maintained throughout the outpatient stay will include: orient to surroundings, keep bed in low position, maintain call bell within reach at all times, provide assistance with transfer out of bed and ambulation.  Medication Inspection Compliance: Pill count conducted under aseptic conditions, in front of the patient. Neither the pills nor the bottle was removed from the patient's sight at any time. Once count was completed pills were immediately returned to the patient in their original bottle.  Medication: Hydrocodone/APAP Pill/Patch Count: 6 of 120 pills remain Pill/Patch Appearance: Markings consistent with prescribed medication Bottle Appearance: Standard pharmacy container. Clearly labeled. Filled Date: 06 / 10 / 2019 Last Medication intake:  Today

## 2017-11-02 NOTE — Patient Instructions (Addendum)
____________________________________________________________________________________________  Medication Rules  Applies to: All patients receiving prescriptions (written or electronic).  Pharmacy of record: Pharmacy where electronic prescriptions will be sent. If written prescriptions are taken to a different pharmacy, please inform the nursing staff. The pharmacy listed in the electronic medical record should be the one where you would like electronic prescriptions to be sent.  Prescription refills: Only during scheduled appointments. Applies to both, written and electronic prescriptions.  NOTE: The following applies primarily to controlled substances (Opioid* Pain Medications).   Patient's responsibilities: 1. Pain Pills: Bring all pain pills to every appointment (except for procedure appointments). 2. Pill Bottles: Bring pills in original pharmacy bottle. Always bring newest bottle. Bring bottle, even if empty. 3. Medication refills: You are responsible for knowing and keeping track of what medications you need refilled. The day before your appointment, write a list of all prescriptions that need to be refilled. Bring that list to your appointment and give it to the admitting nurse. Prescriptions will be written only during appointments. If you forget a medication, it will not be "Called in", "Faxed", or "electronically sent". You will need to get another appointment to get these prescribed. 4. Prescription Accuracy: You are responsible for carefully inspecting your prescriptions before leaving our office. Have the discharge nurse carefully go over each prescription with you, before taking them home. Make sure that your name is accurately spelled, that your address is correct. Check the name and dose of your medication to make sure it is accurate. Check the number of pills, and the written instructions to make sure they are clear and accurate. Make sure that you are given enough medication to last  until your next medication refill appointment. 5. Taking Medication: Take medication as prescribed. Never take more pills than instructed. Never take medication more frequently than prescribed. Taking less pills or less frequently is permitted and encouraged, when it comes to controlled substances (written prescriptions).  6. Inform other Doctors: Always inform, all of your healthcare providers, of all the medications you take. 7. Pain Medication from other Providers: You are not allowed to accept any additional pain medication from any other Doctor or Healthcare provider. There are two exceptions to this rule. (see below) In the event that you require additional pain medication, you are responsible for notifying us, as stated below. 8. Medication Agreement: You are responsible for carefully reading and following our Medication Agreement. This must be signed before receiving any prescriptions from our practice. Safely store a copy of your signed Agreement. Violations to the Agreement will result in no further prescriptions. (Additional copies of our Medication Agreement are available upon request.) 9. Laws, Rules, & Regulations: All patients are expected to follow all Federal and State Laws, Statutes, Rules, & Regulations. Ignorance of the Laws does not constitute a valid excuse. The use of any illegal substances is prohibited. 10. Adopted CDC guidelines & recommendations: Target dosing levels will be at or below 60 MME/day. Use of benzodiazepines** is not recommended.  Exceptions: There are only two exceptions to the rule of not receiving pain medications from other Healthcare Providers. 1. Exception #1 (Emergencies): In the event of an emergency (i.e.: accident requiring emergency care), you are allowed to receive additional pain medication. However, you are responsible for: As soon as you are able, call our office [phone], at any time of the day or night, and leave a message stating your name, the  date and nature of the emergency, and the name and dose of the medication   prescribed. In the event that your call is answered by a member of our staff, make sure to document and save the date, time, and the name of the person that took your information.  2. Exception #2 (Planned Surgery): In the event that you are scheduled by another doctor or dentist to have any type of surgery or procedure, you are allowed (for a period no longer than 30 days), to receive additional pain medication, for the acute post-op pain. However, in this case, you are responsible for picking up a copy of our "Post-op Pain Management for Surgeons" handout, and giving it to your surgeon or dentist. This document is available at our office, and does not require an appointment to obtain it. Simply go to our office during business hours (Monday-Thursday from 8:00 AM to 4:00 PM) (Friday 8:00 AM to 12:00 Noon) or if you have a scheduled appointment with us, prior to your surgery, and ask for it by name. In addition, you will need to provide us with your name, name of your surgeon, type of surgery, and date of procedure or surgery.  *Opioid medications include: morphine, codeine, oxycodone, oxymorphone, hydrocodone, hydromorphone, meperidine, tramadol, tapentadol, buprenorphine, fentanyl, methadone. **Benzodiazepine medications include: diazepam (Valium), alprazolam (Xanax), clonazepam (Klonopine), lorazepam (Ativan), clorazepate (Tranxene), chlordiazepoxide (Librium), estazolam (Prosom), oxazepam (Serax), temazepam (Restoril), triazolam (Halcion) (Last updated: 06/23/2017) ____________________________________________________________________________________________   ____________________________________________________________________________________________  Pain Scale  Introduction: The pain score used by this practice is the Verbal Numerical Rating Scale (VNRS-11). This is an 11-point scale. It is for adults and children 10 years or  older. There are significant differences in how the pain score is reported, used, and applied. Forget everything you learned in the past and learn this scoring system.  General Information: The scale should reflect your current level of pain. Unless you are specifically asked for the level of your worst pain, or your average pain. If you are asked for one of these two, then it should be understood that it is over the past 24 hours.  Basic Activities of Daily Living (ADL): Personal hygiene, dressing, eating, transferring, and using restroom.  Instructions: Most patients tend to report their level of pain as a combination of two factors, their physical pain and their psychosocial pain. This last one is also known as "suffering" and it is reflection of how physical pain affects you socially and psychologically. From now on, report them separately. From this point on, when asked to report your pain level, report only your physical pain. Use the following table for reference.  Pain Clinic Pain Levels (0-5/10)  Pain Level Score  Description  No Pain 0   Mild pain 1 Nagging, annoying, but does not interfere with basic activities of daily living (ADL). Patients are able to eat, bathe, get dressed, toileting (being able to get on and off the toilet and perform personal hygiene functions), transfer (move in and out of bed or a chair without assistance), and maintain continence (able to control bladder and bowel functions). Blood pressure and heart rate are unaffected. A normal heart rate for a healthy adult ranges from 60 to 100 bpm (beats per minute).   Mild to moderate pain 2 Noticeable and distracting. Impossible to hide from other people. More frequent flare-ups. Still possible to adapt and function close to normal. It can be very annoying and may have occasional stronger flare-ups. With discipline, patients may get used to it and adapt.   Moderate pain 3 Interferes significantly with activities of daily  living (ADL).   It becomes difficult to feed, bathe, get dressed, get on and off the toilet or to perform personal hygiene functions. Difficult to get in and out of bed or a chair without assistance. Very distracting. With effort, it can be ignored when deeply involved in activities.   Moderately severe pain 4 Impossible to ignore for more than a few minutes. With effort, patients may still be able to manage work or participate in some social activities. Very difficult to concentrate. Signs of autonomic nervous system discharge are evident: dilated pupils (mydriasis); mild sweating (diaphoresis); sleep interference. Heart rate becomes elevated (>115 bpm). Diastolic blood pressure (lower number) rises above 100 mmHg. Patients find relief in laying down and not moving.   Severe pain 5 Intense and extremely unpleasant. Associated with frowning face and frequent crying. Pain overwhelms the senses.  Ability to do any activity or maintain social relationships becomes significantly limited. Conversation becomes difficult. Pacing back and forth is common, as getting into a comfortable position is nearly impossible. Pain wakes you up from deep sleep. Physical signs will be obvious: pupillary dilation; increased sweating; goosebumps; brisk reflexes; cold, clammy hands and feet; nausea, vomiting or dry heaves; loss of appetite; significant sleep disturbance with inability to fall asleep or to remain asleep. When persistent, significant weight loss is observed due to the complete loss of appetite and sleep deprivation.  Blood pressure and heart rate becomes significantly elevated. Caution: If elevated blood pressure triggers a pounding headache associated with blurred vision, then the patient should immediately seek attention at an urgent or emergency care unit, as these may be signs of an impending stroke.    Emergency Department Pain Levels (6-10/10)  Emergency Room Pain 6 Severely limiting. Requires emergency care  and should not be seen or managed at an outpatient pain management facility. Communication becomes difficult and requires great effort. Assistance to reach the emergency department may be required. Facial flushing and profuse sweating along with potentially dangerous increases in heart rate and blood pressure will be evident.   Distressing pain 7 Self-care is very difficult. Assistance is required to transport, or use restroom. Assistance to reach the emergency department will be required. Tasks requiring coordination, such as bathing and getting dressed become very difficult.   Disabling pain 8 Self-care is no longer possible. At this level, pain is disabling. The individual is unable to do even the most "basic" activities such as walking, eating, bathing, dressing, transferring to a bed, or toileting. Fine motor skills are lost. It is difficult to think clearly.   Incapacitating pain 9 Pain becomes incapacitating. Thought processing is no longer possible. Difficult to remember your own name. Control of movement and coordination are lost.   The worst pain imaginable 10 At this level, most patients pass out from pain. When this level is reached, collapse of the autonomic nervous system occurs, leading to a sudden drop in blood pressure and heart rate. This in turn results in a temporary and dramatic drop in blood flow to the brain, leading to a loss of consciousness. Fainting is one of the body's self defense mechanisms. Passing out puts the brain in a calmed state and causes it to shut down for a while, in order to begin the healing process.    Summary: 1. Refer to this scale when providing us with your pain level. 2. Be accurate and careful when reporting your pain level. This will help with your care. 3. Over-reporting your pain level will lead to loss of credibility. 4. Even   a level of 1/10 means that there is pain and will be treated at our facility. 5. High, inaccurate reporting will be  documented as "Symptom Exaggeration", leading to loss of credibility and suspicions of possible secondary gains such as obtaining more narcotics, or wanting to appear disabled, for fraudulent reasons. 6. Only pain levels of 5 or below will be seen at our facility. 7. Pain levels of 6 and above will be sent to the Emergency Department and the appointment cancelled. ____________________________________________________________________________________________    

## 2017-11-02 NOTE — Progress Notes (Signed)
Patient's Name: Wesley Adams  MRN: [id number]  Referring Provider: Steele Sizer, MD  DOB: 1965-07-10  PCP: Steele Sizer, MD  DOS: 11/02/2017  Note by: Vevelyn Francois NP  Service setting: Ambulatory outpatient  Specialty: Interventional Pain Management  Location: [facility] (AMB) Pain Management Facility    Patient type: Established    Primary Reason(s) for Visit: Encounter for prescription drug management. (Level of risk: moderate)  CC: Back Pain (low) and Leg Pain (worse on the left)  HPI  Wesley Adams is a 52 y.o. year old, male patient, who comes today for a medication management evaluation. He has Lumbar degenerative disc disease; Acute low back pain without sciatica; Spinal cord stimulator status; Chronic, continuous use of opioids; Tobacco use; Epigastric pain; Special screening for malignant neoplasms, colon; Chronic bilateral low back pain with bilateral sciatica; Chronic pain syndrome; Opiate use; and Long term current use of opiate analgesic on their problem list. His primarily concern today is the Back Pain (low) and Leg Pain (worse on the left)  Pain Assessment: Location: Lower Back Radiating: legs Onset: More than a month ago Duration: Chronic pain Quality: Numbness, Aching, Constant Severity: 8 /10 (subjective, self-reported pain score)  Note: Reported level is compatible with observation. Clinically the patient looks like a 3/10 A 3/10 is viewed as "Moderate" and described as significantly interfering with activities of daily living (ADL). It becomes difficult to feed, bathe, get dressed, get on and off the toilet or to perform personal hygiene functions. Difficult to get in and out of bed or a chair without assistance. Very distracting. With effort, it can be ignored when deeply involved in activities. Information on the proper use of the pain scale provided to the patient today. When using our objective Pain Scale, levels between 6 and 10/10 are said to belong in an emergency  room, as it progressively worsens from a 6/10, described as severely limiting, requiring emergency care not usually available at an outpatient pain management facility. At a 6/10 level, communication becomes difficult and requires great effort. Assistance to reach the emergency department may be required. Facial flushing and profuse sweating along with potentially dangerous increases in heart rate and blood pressure will be evident. Effect on ADL:   Timing: Constant Modifying factors: medications, rest, heat BP: (!) 151/89  HR: 71  Wesley Adams was last scheduled for an appointment on 10/03/2017 for medication management. During today's appointment we reviewed Wesley Adams chronic pain status, as well as his outpatient medication regimen. He admits that his pain is getting worse. He states that the SCS was effective for his pain however secondary to poor battery back placement it was too painful. He admits that the Norco is somewhat effective for his pain. He does not want to get "hooked" on them. He feels like this makes him itch. He feels like it has improved since his is taking it regularly. He admits that he went through withdrawal symptoms secondary to the [facility].  He admits that the [facility] was effective to help him sleep. He admits that he sleeps for 5 hours.  He wants to get a second opinion with neurosurgeon at [facility], that was recommended by his friend. He is trying to be hopeful and using his spirtuality to have him get through.   The patient  reports that he does not use drugs. His body mass index is 27.71 kg/m.  Further details on both, my assessment(s), as well as the proposed treatment plan, please see below.  Controlled Substance Pharmacotherapy  Assessment REMS (Risk Evaluation and Mitigation Strategy)  Analgesic: Hydrocodone /APAP 10/363m QID MME/day: 40 mg/day.  SHart Rochester RN  11/02/2017  2:15 PM  Sign at close encounter Nursing Pain Medication Assessment:  Safety  precautions to be maintained throughout the outpatient stay will include: orient to surroundings, keep bed in low position, maintain call bell within reach at all times, provide assistance with transfer out of bed and ambulation.  Medication Inspection Compliance: Pill count conducted under aseptic conditions, in front of the patient. Neither the pills nor the bottle was removed from the patient's sight at any time. Once count was completed pills were immediately returned to the patient in their original bottle.  Medication: Hydrocodone/APAP Pill/Patch Count: 6 of 120 pills remain Pill/Patch Appearance: Markings consistent with prescribed medication Bottle Appearance: Standard pharmacy container. Clearly labeled. Filled Date: 06 / 10 / 2019 Last Medication intake:  Today   Pharmacokinetics: Liberation and absorption (onset of action): WNL Distribution (time to peak effect): WNL Metabolism and excretion (duration of action): WNL         Pharmacodynamics: Desired effects: Analgesia: Mr. JLinchreports >50% benefit. Functional ability: Patient reports that medication allows him to accomplish basic ADLs Clinically meaningful improvement in function (CMIF): Sustained CMIF goals met Perceived effectiveness: Described as relatively effective, allowing for increase in activities of daily living (ADL) Undesirable effects: Side-effects or Adverse reactions: None reported Monitoring: [place] PMP: Online review of the past 163-montheriod conducted. Compliant with practice rules and regulations Last UDS on record: Summary  Date Value Ref Range Status  10/03/2017 FINAL  Final    Comment:    ==================================================================== TOXASSURE SELECT 13 (MW) ==================================================================== Test                             Result       Flag       Units Drug Present not Declared for Prescription Verification   Carboxy-THC                     52           UNEXPECTED ng/mg creat    Carboxy-THC is a metabolite of tetrahydrocannabinol  (THC).    [facility], but THC is also present    in a scheduled prescription medication. ==================================================================== Test                      Result    Flag   Units      Ref Range   Creatinine              63               mg/dL      >=20 ==================================================================== Declared Medications:  The flagging and interpretation on this report are based on the  following declared medications.  Unexpected results may arise from  inaccuracies in the declared medications.  **Note: The testing scope of this panel does not include following  reported medications:  Omeprazole  Tizanidine ==================================================================== For clinical consultation, please call ([phone]====================================================================    UDS interpretation: Compliant          Medication Assessment Form: Reviewed. Patient indicates being compliant with therapy Treatment compliance: Compliant Risk Assessment Profile: Aberrant behavior: See prior evaluations. None observed or detected today Comorbid factors increasing risk of overdose: See prior notes. No additional risks detected today Risk of substance  use disorder (SUD): Low Opioid Risk Tool - 10/03/17 1333      Family History of Substance Abuse   Alcohol  Negative    Illegal Drugs  Negative    Rx Drugs  Negative      Personal History of Substance Abuse   Alcohol  Negative    Illegal Drugs  Positive Male or Male [place] went needed for pain   M[place] went needed for pain   Rx Drugs  Negative      Psychological Disease   Psychological Disease  Negative    Depression  Negative      Total Score   Opioid Risk Tool Scoring  4    Opioid Risk Interpretation  Moderate Risk      ORT Scoring interpretation  table:  Score <3 = Low Risk for SUD  Score between 4-7 = Moderate Risk for SUD  Score >8 = High Risk for Opioid Abuse   Risk Mitigation Strategies:  Patient Counseling: Covered Patient-Prescriber Agreement (PPA): Present and active  Notification to other healthcare providers: Done  Pharmacologic Plan: No change in therapy, at this time.             Laboratory Chemistry  Inflammation Markers (CRP: Acute Phase) (ESR: Chronic Phase) No results found for: CRP, ESRSEDRATE, LATICACIDVEN                       Rheumatology Markers No results found for: RF, ANA, LABURIC, URICUR, LYMEIGGIGMAB, LYMEABIGMQN, HLAB27                      Renal Function Markers Lab Results  Component Value Date   BUN 11 08/02/2017   CREATININE 0.88 31/43/8887   BCR NOT APPLICABLE 57/97/2820   GFRAA >60 08/02/2017   GFRNONAA >60 08/02/2017                             Hepatic Function Markers Lab Results  Component Value Date   AST 27 08/02/2017   ALT 23 08/02/2017   ALBUMIN 4.6 08/02/2017   ALKPHOS 75 08/02/2017   LIPASE 28 08/02/2017                        Electrolytes Lab Results  Component Value Date   NA 137 08/02/2017   K 3.9 08/02/2017   CL 106 08/02/2017   CALCIUM 9.4 08/02/2017                        Neuropathy Markers Lab Results  Component Value Date   HIV NONREACTIVE 01/07/2016                        Bone Pathology Markers No results found for: [place], [id number], [id number], [id number], 25OHVITD1, 25OHVITD2, 25OHVITD3, TESTOFREE, TESTOSTERONE                       Coagulation Parameters Lab Results  Component Value Date   PLT 189 08/02/2017                        Cardiovascular Markers Lab Results  Component Value Date   HGB 16.7 08/02/2017   HCT 50.5 08/02/2017  CA Markers No results found for: CEA, CA125, LABCA2                      Note: Lab results reviewed.  Recent Diagnostic Imaging Results  DG C-Arm 1-60 Min-No Report Fluoroscopy  was utilized by the requesting physician.  No radiographic  interpretation.   Complexity Note: Imaging results reviewed. Results shared with Mr. Brayman, using Layman's terms.                         Meds   Current Outpatient Medications:  .  HYDROcodone-acetaminophen (NORCO) 10-325 MG tablet, Take 1 tablet by mouth every 6 (six) hours as needed for severe pain., Disp: 120 tablet, Rfl: 0 .  tiZANidine (ZANAFLEX) 4 MG tablet, Take 1 tablet (4 mg total) by mouth every 8 (eight) hours as needed for muscle spasms., Disp: 90 tablet, Rfl: 1  ROS  Constitutional: Denies any fever or chills Gastrointestinal: No reported hemesis, hematochezia, vomiting, or acute GI distress Musculoskeletal: Denies any acute onset joint swelling, redness, loss of ROM, or weakness Neurological: No reported episodes of acute onset apraxia, aphasia, dysarthria, agnosia, amnesia, paralysis, loss of coordination, or loss of consciousness  Allergies  Mr. Acree has No Known Allergies.  [place]  Drug: Mr. Dock  reports that he does not use drugs. Alcohol:  reports that he drinks about 1.8 oz of alcohol per week. Tobacco:  reports that he has been smoking cigarettes.  He has a 25.00 pack-year smoking history. He has never used smokeless tobacco. Medical:  has a past medical history of Chronic back pain. Surgical: Mr. Hemmerich  has a past surgical history that includes Back surgery (Feb 2016); Spinal cord stimulator insertion (03/23/2017); Colonoscopy with propofol (N/A, 06/01/2017); Esophagogastroduodenoscopy (egd) with propofol (06/01/2017); polypectomy (06/01/2017); Spinal cord stimulator removal (N/A, 06/16/2017); and Fracture surgery (Right, as child). Family: family history includes Alcohol abuse in his mother; Heart attack in his father; Heart disease in his father; Obesity in his father.  Constitutional Exam  General appearance: Well nourished, well developed, and well hydrated. In no apparent acute distress Vitals:    11/02/17 1416  BP: (!) 151/89  Pulse: 71  Resp: 18  Temp: 98 F (36.7 C)  TempSrc: Oral  SpO2: 99%  Weight: 210 lb (95.3 kg)  Height: 6' 1"  (1.854 m)   BMI Assessment: Estimated body mass index is 27.71 kg/m as calculated from the following:   Height as of this encounter: 6' 1"  (1.854 m).   Weight as of this encounter: 210 lb (95.3 kg). Psych/Mental status: Alert, oriented x 3 (person, place, & time)       Eyes: PERLA Respiratory: No evidence of acute respiratory distress Thoracic Spine Area Exam  Skin & Axial Inspection: Well healed scar from previous spine surgery detected Alignment: Symmetrical Functional ROM: Unrestricted ROM Stability: No instability detected Muscle Tone/Strength: Functionally intact. No obvious neuro-muscular anomalies detected. Sensory (Neurological): Unimpaired Muscle strength & Tone: Complains of area being tender to palpation  Lumbar Spine Area Exam  Skin & Axial Inspection: Well healed scar from previous spine surgery detected Alignment: Symmetrical Functional ROM: Unrestricted ROM       Stability: No instability detected Muscle Tone/Strength: Functionally intact. No obvious neuro-muscular anomalies detected. Sensory (Neurological): Unimpaired Palpation: Complains of area being tender to palpation       Provocative Tests: Lumbar Hyperextension/rotation test: deferred today       Lumbar quadrant test (Kemp's test): deferred today  Lumbar Lateral bending test: deferred today       Patrick's Maneuver: deferred today                   FABER test: deferred today       Thigh-thrust test: deferred today       S-I compression test: deferred today       S-I distraction test: deferred today        Gait & Posture Assessment  Ambulation: Unassisted Gait: Relatively normal for age and body habitus Posture: WNL   Lower Extremity Exam    Side: Right lower extremity  Side: Left lower extremity  Stability: No instability observed           Stability: No instability observed          Skin & Extremity Inspection: Skin color, temperature, and hair growth are WNL. No peripheral edema or cyanosis. No masses, redness, swelling, asymmetry, or associated skin lesions. No contractures.  Skin & Extremity Inspection: Skin color, temperature, and hair growth are WNL. No peripheral edema or cyanosis. No masses, redness, swelling, asymmetry, or associated skin lesions. No contractures.  Functional ROM: Unrestricted ROM                  Functional ROM: Unrestricted ROM                  Muscle Tone/Strength: Functionally intact. No obvious neuro-muscular anomalies detected.  Muscle Tone/Strength: Functionally intact. No obvious neuro-muscular anomalies detected.  Sensory (Neurological): Unimpaired  Sensory (Neurological): Unimpaired  Palpation: No palpable anomalies  Palpation: No palpable anomalies   Assessment  Primary Diagnosis & Pertinent Problem List: The primary encounter diagnosis was Chronic bilateral low back pain with bilateral sciatica. Diagnoses of Lumbar degenerative disc disease, Chronic pain syndrome, and Long term current use of opiate analgesic were also pertinent to this visit.  Status Diagnosis  Persistent Persistent Controlled 1. Chronic bilateral low back pain with bilateral sciatica   2. Lumbar degenerative disc disease   3. Chronic pain syndrome   4. Long term current use of opiate analgesic     Problems updated and reviewed during this visit: Problem  Long Term Current Use of Opiate Analgesic   Plan of Care  Pharmacotherapy (Medications Ordered): Meds ordered this encounter  Medications  . tiZANidine (ZANAFLEX) 4 MG tablet    Sig: Take 1 tablet (4 mg total) by mouth every 8 (eight) hours as needed for muscle spasms.    Dispense:  90 tablet    Refill:  1    Order Specific Question:   Supervising Provider    Answer:   Milinda Pointer [phone]  . HYDROcodone-acetaminophen (NORCO) 10-325 MG tablet    Sig:  Take 1 tablet by mouth every 6 (six) hours as needed for severe pain.    Dispense:  120 tablet    Refill:  0    Do not place this medication, or any other prescription from our practice, on "Automatic Refill". Patient may have prescription filled one day early if pharmacy is closed on scheduled refill date. Do not fill until: 11/03/2017 To last until: 12/03/2017    Order Specific Question:   Supervising Provider    Answer:   Milinda Pointer [phone]   New Prescriptions   No medications on file   Medications administered today: Javonne Dorko. Strutz had no medications administered during this visit. Lab-work, procedure(s), and/or referral(s): No orders of the defined types were placed in this encounter.  Imaging and/or  referral(s): None  Interventional therapies: Planned, scheduled, and/or pending:   Not at this time.   Considering:   TFF LESI  Bilateral Lumbar Facet    Provider-requested follow-up: Return in about 1 month (around 11/30/2017) for MedMgmt with Me Donella Stade Edison Pace).  Future Appointments  Date Time Provider [place]  11/29/2017  2:30 PM Vevelyn Francois, NP [facility] None   Primary Care Physician: Steele Sizer, MD Location: [facility] Outpatient Pain Management Facility Note by: Vevelyn Francois NP Date: 11/02/2017; Time: 11:40 AM  Pain Score Disclaimer: We use the NRS-11 scale. This is a self-reported, subjective measurement of pain severity with only modest accuracy. It is used primarily to identify changes within a particular patient. It must be understood that outpatient pain scales are significantly less accurate that those used for research, where they can be applied under ideal controlled circumstances with minimal exposure to variables. In reality, the score is likely to be a combination of pain intensity and pain affect, where pain affect describes the degree of emotional arousal or changes in action readiness caused by the sensory experience of pain. Factors  such as social and work situation, setting, emotional state, anxiety levels, expectation, and prior pain experience may influence pain perception and show large inter-individual differences that may also be affected by time variables.  Patient instructions provided during this appointment: Patient Instructions   ____________________________________________________________________________________________  Medication Rules  Applies to: All patients receiving prescriptions (written or electronic).  Pharmacy of record: Pharmacy where electronic prescriptions will be sent. If written prescriptions are taken to a different pharmacy, please inform the nursing staff. The pharmacy listed in the electronic medical record should be the one where you would like electronic prescriptions to be sent.  Prescription refills: Only during scheduled appointments. Applies to both, written and electronic prescriptions.  NOTE: The following applies primarily to controlled substances (Opioid* Pain Medications).   Patient's responsibilities: 1. Pain Pills: Bring all pain pills to every appointment (except for procedure appointments). 2. Pill Bottles: Bring pills in original pharmacy bottle. Always bring newest bottle. Bring bottle, even if empty. 3. Medication refills: You are responsible for knowing and keeping track of what medications you need refilled. The day before your appointment, write a list of all prescriptions that need to be refilled. Bring that list to your appointment and give it to the admitting nurse. Prescriptions will be written only during appointments. If you forget a medication, it will not be "Called in", "Faxed", or "electronically sent". You will need to get another appointment to get these prescribed. 4. Prescription Accuracy: You are responsible for carefully inspecting your prescriptions before leaving our office. Have the discharge nurse carefully go over each prescription with you, before  taking them home. Make sure that your name is accurately spelled, that your address is correct. Check the name and dose of your medication to make sure it is accurate. Check the number of pills, and the written instructions to make sure they are clear and accurate. Make sure that you are given enough medication to last until your next medication refill appointment. 5. Taking Medication: Take medication as prescribed. Never take more pills than instructed. Never take medication more frequently than prescribed. Taking less pills or less frequently is permitted and encouraged, when it comes to controlled substances (written prescriptions).  6. Inform other Doctors: Always inform, all of your healthcare providers, of all the medications you take. 7. Pain Medication from other Providers: You are not allowed to accept any additional pain medication  from any other Doctor or Healthcare provider. There are two exceptions to this rule. (see below) In the event that you require additional pain medication, you are responsible for notifying us, as stated below. 8. Medication Agreement: You are responsible for carefully reading and following our Medication Agreement. This must be signed before receiving any prescriptions from our practice. Safely store a copy of your signed Agreement. Violations to the Agreement will result in no further prescriptions. (Additional copies of our Medication Agreement are available upon request.) 9. Laws, Rules, & Regulations: All patients are expected to follow all Federal and [employer], [employer], Rules, [employer]. Ignorance of the Laws does not constitute a valid excuse. The use of any illegal substances is prohibited. 10. Adopted CDC guidelines & recommendations: Target dosing levels will be at or below 60 MME/day. Use of benzodiazepines** is not recommended.  Exceptions: There are only two exceptions to the rule of not receiving pain medications from other Healthcare  Providers. 1. Exception #1 (Emergencies): In the event of an emergency (i.e.: accident requiring emergency care), you are allowed to receive additional pain medication. However, you are responsible for: As soon as you are able, call our office [phone], at any time of the day or night, and leave a message stating your name, the date and nature of the emergency, and the name and dose of the medication prescribed. In the event that your call is answered by a member of our staff, make sure to document and save the date, time, and the name of the person that took your information.  2. Exception #2 (Planned Surgery): In the event that you are scheduled by another doctor or dentist to have any type of surgery or procedure, you are allowed (for a period no longer than 30 days), to receive additional pain medication, for the acute post-op pain. However, in this case, you are responsible for picking up a copy of our "Post-op Pain Management for Surgeons" handout, and giving it to your surgeon or dentist. This document is available at our office, and does not require an appointment to obtain it. Simply go to our office during business hours (Monday-Thursday from 8:00 AM to 4:00 PM) (Friday 8:00 AM to 12:00 Noon) or if you have a scheduled appointment with [place], prior to your surgery, and ask for it by name. In addition, you will need to provide [place] with your name, name of your surgeon, type of surgery, and date of procedure or surgery.  *Opioid medications include: morphine, codeine, oxycodone, oxymorphone, hydrocodone, hydromorphone, meperidine, tramadol, tapentadol, buprenorphine, fentanyl, methadone. **Benzodiazepine medications include: diazepam (Valium), alprazolam (Xanax), clonazepam (Klonopine), lorazepam (Ativan), clorazepate (Tranxene), chlordiazepoxide (Librium), estazolam (Prosom), oxazepam (Serax), temazepam (Restoril), triazolam (Halcion) (Last updated:  06/23/2017) ____________________________________________________________________________________________   ____________________________________________________________________________________________  Pain Scale  Introduction: The pain score used by this practice is the Verbal Numerical Rating Scale (VNRS-11). This is an 11-point scale. It is for adults and children 10 years or older. There are significant differences in how the pain score is reported, used, and applied. Forget everything you learned in the past and learn this scoring system.  General Information: The scale should reflect your current level of pain. Unless you are specifically asked for the level of your worst pain, or your average pain. If you are asked for one of these two, then it should be understood that it is over the past 24 hours.  Basic Activities of Daily Living (ADL): Personal hygiene, dressing, eating, transferring, and using restroom.  Instructions: Most  patients tend to report their level of pain as a combination of two factors, their physical pain and their psychosocial pain. This last one is also known as "suffering" and it is reflection of how physical pain affects you socially and psychologically. From now on, report them separately. From this point on, when asked to report your pain level, report only your physical pain. Use the following table for reference.  Pain Clinic Pain Levels (0-5/10)  Pain Level Score  Description  No Pain 0   Mild pain 1 Nagging, annoying, but does not interfere with basic activities of daily living (ADL). Patients are able to eat, bathe, get dressed, toileting (being able to get on and off the toilet and perform personal hygiene functions), transfer (move in and out of bed or a chair without assistance), and maintain continence (able to control bladder and bowel functions). Blood pressure and heart rate are unaffected. A normal heart rate for a healthy adult ranges from 60 to 100 bpm  (beats per minute).   Mild to moderate pain 2 Noticeable and distracting. Impossible to hide from other people. More frequent flare-ups. Still possible to adapt and function close to normal. It can be very annoying and may have occasional stronger flare-ups. With discipline, patients may get used to it and adapt.   Moderate pain 3 Interferes significantly with activities of daily living (ADL). It becomes difficult to feed, bathe, get dressed, get on and off the toilet or to perform personal hygiene functions. Difficult to get in and out of bed or a chair without assistance. Very distracting. With effort, it can be ignored when deeply involved in activities.   Moderately severe pain 4 Impossible to ignore for more than a few minutes. With effort, patients may still be able to manage work or participate in some social activities. Very difficult to concentrate. Signs of autonomic nervous system discharge are evident: dilated pupils (mydriasis); mild sweating (diaphoresis); sleep interference. Heart rate becomes elevated (>115 bpm). Diastolic blood pressure (lower number) rises above 100 mmHg. Patients find relief in laying down and not moving.   Severe pain 5 Intense and extremely unpleasant. Associated with frowning face and frequent crying. Pain overwhelms the senses.  Ability to do any activity or maintain social relationships becomes significantly limited. Conversation becomes difficult. Pacing back and forth is common, as getting into a comfortable position is nearly impossible. Pain wakes you up from deep sleep. Physical signs will be obvious: pupillary dilation; increased sweating; goosebumps; brisk reflexes; cold, clammy hands and feet; nausea, vomiting or dry heaves; loss of appetite; significant sleep disturbance with inability to fall asleep or to remain asleep. When persistent, significant weight loss is observed due to the complete loss of appetite and sleep deprivation.  Blood pressure and heart  rate becomes significantly elevated. Caution: If elevated blood pressure triggers a pounding headache associated with blurred vision, then the patient should immediately seek attention at an urgent or emergency care unit, as these may be signs of an impending stroke.    Emergency Department Pain Levels (6-10/10)  Emergency Room Pain 6 Severely limiting. Requires emergency care and should not be seen or managed at an outpatient pain management facility. Communication becomes difficult and requires great effort. Assistance to reach the emergency department may be required. Facial flushing and profuse sweating along with potentially dangerous increases in heart rate and blood pressure will be evident.   Distressing pain 7 Self-care is very difficult. Assistance is required to transport, or use restroom. Assistance to  reach the emergency department will be required. Tasks requiring coordination, such as bathing and getting dressed become very difficult.   Disabling pain 8 Self-care is no longer possible. At this level, pain is disabling. The individual is unable to do even the most "basic" activities such as walking, eating, bathing, dressing, transferring to a bed, or toileting. Fine motor skills are lost. It is difficult to think clearly.   Incapacitating pain 9 Pain becomes incapacitating. Thought processing is no longer possible. Difficult to remember your own name. Control of movement and coordination are lost.   The worst pain imaginable 10 At this level, most patients pass out from pain. When this level is reached, collapse of the autonomic nervous system occurs, leading to a sudden drop in blood pressure and heart rate. This in turn results in a temporary and dramatic drop in blood flow to the brain, leading to a loss of consciousness. Fainting is one of the body's self defense mechanisms. Passing out puts the brain in a calmed state and causes it to shut down for a while, in order to begin the  healing process.    Summary: 1. Refer to this scale when providing [place] with your pain level. 2. Be accurate and careful when reporting your pain level. This will help with your care. 3. Over-reporting your pain level will lead to loss of credibility. 4. Even a level of 1/10 means that there is pain and will be treated at our facility. 5. High, inaccurate reporting will be documented as "Symptom Exaggeration", leading to loss of credibility and suspicions of possible secondary gains such as obtaining more narcotics, or wanting to appear disabled, for fraudulent reasons. 6. Only pain levels of 5 or below will be seen at our facility. 7. Pain levels of 6 and above will be sent to the Emergency Department and the appointment cancelled. ____________________________________________________________________________________________

## 2017-11-03 DIAGNOSIS — Z79891 Long term (current) use of opiate analgesic: Secondary | ICD-10-CM | POA: Insufficient documentation

## 2017-11-13 ENCOUNTER — Other Ambulatory Visit: Payer: Self-pay | Admitting: Gastroenterology

## 2017-11-13 DIAGNOSIS — R1013 Epigastric pain: Secondary | ICD-10-CM

## 2017-11-29 ENCOUNTER — Other Ambulatory Visit: Payer: Self-pay

## 2017-11-29 ENCOUNTER — Encounter: Payer: Self-pay | Admitting: Nurse Practitioner

## 2017-11-29 ENCOUNTER — Ambulatory Visit: Payer: BLUE CROSS/BLUE SHIELD | Attending: Nurse Practitioner | Admitting: Nurse Practitioner

## 2017-11-29 VITALS — BP 153/88 | HR 70 | Temp 97.8°F | Resp 16 | Ht 73.0 in | Wt 210.0 lb

## 2017-11-29 DIAGNOSIS — Z79891 Long term (current) use of opiate analgesic: Secondary | ICD-10-CM | POA: Diagnosis not present

## 2017-11-29 DIAGNOSIS — Z79899 Other long term (current) drug therapy: Secondary | ICD-10-CM | POA: Diagnosis not present

## 2017-11-29 DIAGNOSIS — M5136 Other intervertebral disc degeneration, lumbar region: Secondary | ICD-10-CM | POA: Diagnosis not present

## 2017-11-29 DIAGNOSIS — M5442 Lumbago with sciatica, left side: Secondary | ICD-10-CM | POA: Diagnosis not present

## 2017-11-29 DIAGNOSIS — F17210 Nicotine dependence, cigarettes, uncomplicated: Secondary | ICD-10-CM | POA: Insufficient documentation

## 2017-11-29 DIAGNOSIS — Z5181 Encounter for therapeutic drug level monitoring: Secondary | ICD-10-CM | POA: Insufficient documentation

## 2017-11-29 DIAGNOSIS — G894 Chronic pain syndrome: Secondary | ICD-10-CM | POA: Diagnosis not present

## 2017-11-29 DIAGNOSIS — M5441 Lumbago with sciatica, right side: Secondary | ICD-10-CM | POA: Diagnosis not present

## 2017-11-29 DIAGNOSIS — G8929 Other chronic pain: Secondary | ICD-10-CM

## 2017-11-29 MED ORDER — HYDROCODONE-ACETAMINOPHEN 10-325 MG PO TABS: 1.0000 | ORAL_TABLET | Freq: Four times a day (QID) | ORAL | 0 refills | Status: DC | PRN

## 2017-11-29 MED ORDER — TIZANIDINE HCL 4 MG PO TABS: 4.0000 mg | ORAL_TABLET | Freq: Three times a day (TID) | ORAL | 1 refills | Status: DC | PRN

## 2017-11-29 NOTE — Progress Notes (Signed)
Nursing Pain Medication Assessment:  Safety precautions to be maintained throughout the outpatient stay will include: orient to surroundings, keep bed in low position, maintain call bell within reach at all times, provide assistance with transfer out of bed and ambulation.  Medication Inspection Compliance: Pill count conducted under aseptic conditions, in front of the patient. Neither the pills nor the bottle was removed from the patient's sight at any time. Once count was completed pills were immediately returned to the patient in their original bottle.  Medication: Hydrocodone/APAP Pill/Patch Count: 1 of 120 pills remain Pill/Patch Appearance: Markings consistent with prescribed medication Bottle Appearance: Standard pharmacy container. Clearly labeled. Filled Date: 7 / 52 / 2019 Last Medication intake:  Today

## 2017-11-29 NOTE — Patient Instructions (Addendum)
Zanaflex has been escribed to your pharmacy.  You have been given Rx for Hydrocodone with Tylenol to last until 01/02/2018.____________________________________________________________________________________________  Medication Rules  Applies to: All patients receiving prescriptions (written or electronic).  Pharmacy of record: Pharmacy where electronic prescriptions will be sent. If written prescriptions are taken to a different pharmacy, please inform the nursing staff. The pharmacy listed in the electronic medical record should be the one where you would like electronic prescriptions to be sent.  Prescription refills: Only during scheduled appointments. Applies to both, written and electronic prescriptions.  NOTE: The following applies primarily to controlled substances (Opioid* Pain Medications).   Patient's responsibilities: 1. Pain Pills: Bring all pain pills to every appointment (except for procedure appointments). 2. Pill Bottles: Bring pills in original pharmacy bottle. Always bring newest bottle. Bring bottle, even if empty. 3. Medication refills: You are responsible for knowing and keeping track of what medications you need refilled. The day before your appointment, write a list of all prescriptions that need to be refilled. Bring that list to your appointment and give it to the admitting nurse. Prescriptions will be written only during appointments. If you forget a medication, it will not be "Called in", "Faxed", or "electronically sent". You will need to get another appointment to get these prescribed. 4. Prescription Accuracy: You are responsible for carefully inspecting your prescriptions before leaving our office. Have the discharge nurse carefully go over each prescription with you, before taking them home. Make sure that your name is accurately spelled, that your address is correct. Check the name and dose of your medication to make sure it is accurate. Check the number of pills,  and the written instructions to make sure they are clear and accurate. Make sure that you are given enough medication to last until your next medication refill appointment. 5. Taking Medication: Take medication as prescribed. Never take more pills than instructed. Never take medication more frequently than prescribed. Taking less pills or less frequently is permitted and encouraged, when it comes to controlled substances (written prescriptions).  6. Inform other Doctors: Always inform, all of your healthcare providers, of all the medications you take. 7. Pain Medication from other Providers: You are not allowed to accept any additional pain medication from any other Doctor or Healthcare provider. There are two exceptions to this rule. (see below) In the event that you require additional pain medication, you are responsible for notifying us, as stated below. 8. Medication Agreement: You are responsible for carefully reading and following our Medication Agreement. This must be signed before receiving any prescriptions from our practice. Safely store a copy of your signed Agreement. Violations to the Agreement will result in no further prescriptions. (Additional copies of our Medication Agreement are available upon request.) 9. Laws, Rules, & Regulations: All patients are expected to follow all Federal and [employer], [employer], Rules, [employer]. Ignorance of the Laws does not constitute a valid excuse. The use of any illegal substances is prohibited. 10. Adopted CDC guidelines & recommendations: Target dosing levels will be at or below 60 MME/day. Use of benzodiazepines** is not recommended.  Exceptions: There are only two exceptions to the rule of not receiving pain medications from other Healthcare Providers. 1. Exception #1 (Emergencies): In the event of an emergency (i.e.: accident requiring emergency care), you are allowed to receive additional pain medication. However, you are responsible for: As  soon as you are able, call our office [phone], at any time of the day or night, and leave  a message stating your name, the date and nature of the emergency, and the name and dose of the medication prescribed. In the event that your call is answered by a member of our staff, make sure to document and save the date, time, and the name of the person that took your information.  2. Exception #2 (Planned Surgery): In the event that you are scheduled by another doctor or dentist to have any type of surgery or procedure, you are allowed (for a period no longer than 30 days), to receive additional pain medication, for the acute post-op pain. However, in this case, you are responsible for picking up a copy of our "Post-op Pain Management for Surgeons" handout, and giving it to your surgeon or dentist. This document is available at our office, and does not require an appointment to obtain it. Simply go to our office during business hours (Monday-Thursday from 8:00 AM to 4:00 PM) (Friday 8:00 AM to 12:00 Noon) or if you have a scheduled appointment with [place], prior to your surgery, and ask for it by name. In addition, you will need to provide [place] with your name, name of your surgeon, type of surgery, and date of procedure or surgery.  *Opioid medications include: morphine, codeine, oxycodone, oxymorphone, hydrocodone, hydromorphone, meperidine, tramadol, tapentadol, buprenorphine, fentanyl, methadone. **Benzodiazepine medications include: diazepam (Valium), alprazolam (Xanax), clonazepam (Klonopine), lorazepam (Ativan), clorazepate (Tranxene), chlordiazepoxide (Librium), estazolam (Prosom), oxazepam (Serax), temazepam (Restoril), triazolam (Halcion) (Last updated: 06/23/2017) ____________________________________________________________________________________________   ____________________________________________________________________________________________  Pain Scale  Introduction: The pain score used by  this practice is the Verbal Numerical Rating Scale (VNRS-11). This is an 11-point scale. It is for adults and children 10 years or older. There are significant differences in how the pain score is reported, used, and applied. Forget everything you learned in the past and learn this scoring system.  General Information: The scale should reflect your current level of pain. Unless you are specifically asked for the level of your worst pain, or your average pain. If you are asked for one of these two, then it should be understood that it is over the past 24 hours.  Basic Activities of Daily Living (ADL): Personal hygiene, dressing, eating, transferring, and using restroom.  Instructions: Most patients tend to report their level of pain as a combination of two factors, their physical pain and their psychosocial pain. This last one is also known as "suffering" and it is reflection of how physical pain affects you socially and psychologically. From now on, report them separately. From this point on, when asked to report your pain level, report only your physical pain. Use the following table for reference.  Pain Clinic Pain Levels (0-5/10)  Pain Level Score  Description  No Pain 0   Mild pain 1 Nagging, annoying, but does not interfere with basic activities of daily living (ADL). Patients are able to eat, bathe, get dressed, toileting (being able to get on and off the toilet and perform personal hygiene functions), transfer (move in and out of bed or a chair without assistance), and maintain continence (able to control bladder and bowel functions). Blood pressure and heart rate are unaffected. A normal heart rate for a healthy adult ranges from 60 to 100 bpm (beats per minute).   Mild to moderate pain 2 Noticeable and distracting. Impossible to hide from other people. More frequent flare-ups. Still possible to adapt and function close to normal. It can be very annoying and may have occasional stronger  flare-ups. With discipline, patients  may get used to it and adapt.   Moderate pain 3 Interferes significantly with activities of daily living (ADL). It becomes difficult to feed, bathe, get dressed, get on and off the toilet or to perform personal hygiene functions. Difficult to get in and out of bed or a chair without assistance. Very distracting. With effort, it can be ignored when deeply involved in activities.   Moderately severe pain 4 Impossible to ignore for more than a few minutes. With effort, patients may still be able to manage work or participate in some social activities. Very difficult to concentrate. Signs of autonomic nervous system discharge are evident: dilated pupils (mydriasis); mild sweating (diaphoresis); sleep interference. Heart rate becomes elevated (>115 bpm). Diastolic blood pressure (lower number) rises above 100 mmHg. Patients find relief in laying down and not moving.   Severe pain 5 Intense and extremely unpleasant. Associated with frowning face and frequent crying. Pain overwhelms the senses.  Ability to do any activity or maintain social relationships becomes significantly limited. Conversation becomes difficult. Pacing back and forth is common, as getting into a comfortable position is nearly impossible. Pain wakes you up from deep sleep. Physical signs will be obvious: pupillary dilation; increased sweating; goosebumps; brisk reflexes; cold, clammy hands and feet; nausea, vomiting or dry heaves; loss of appetite; significant sleep disturbance with inability to fall asleep or to remain asleep. When persistent, significant weight loss is observed due to the complete loss of appetite and sleep deprivation.  Blood pressure and heart rate becomes significantly elevated. Caution: If elevated blood pressure triggers a pounding headache associated with blurred vision, then the patient should immediately seek attention at an urgent or emergency care unit, as these may be signs of an  impending stroke.    Emergency Department Pain Levels (6-10/10)  Emergency Room Pain 6 Severely limiting. Requires emergency care and should not be seen or managed at an outpatient pain management facility. Communication becomes difficult and requires great effort. Assistance to reach the emergency department may be required. Facial flushing and profuse sweating along with potentially dangerous increases in heart rate and blood pressure will be evident.   Distressing pain 7 Self-care is very difficult. Assistance is required to transport, or use restroom. Assistance to reach the emergency department will be required. Tasks requiring coordination, such as bathing and getting dressed become very difficult.   Disabling pain 8 Self-care is no longer possible. At this level, pain is disabling. The individual is unable to do even the most "basic" activities such as walking, eating, bathing, dressing, transferring to a bed, or toileting. Fine motor skills are lost. It is difficult to think clearly.   Incapacitating pain 9 Pain becomes incapacitating. Thought processing is no longer possible. Difficult to remember your own name. Control of movement and coordination are lost.   The worst pain imaginable 10 At this level, most patients pass out from pain. When this level is reached, collapse of the autonomic nervous system occurs, leading to a sudden drop in blood pressure and heart rate. This in turn results in a temporary and dramatic drop in blood flow to the brain, leading to a loss of consciousness. Fainting is one of the body's self defense mechanisms. Passing out puts the brain in a calmed state and causes it to shut down for a while, in order to begin the healing process.    Summary: 1. Refer to this scale when providing [place] with your pain level. 2. Be accurate and careful when reporting your pain  level. This will help with your care. 3. Over-reporting your pain level will lead to loss of  credibility. 4. Even a level of 1/10 means that there is pain and will be treated at our facility. 5. High, inaccurate reporting will be documented as "Symptom Exaggeration", leading to loss of credibility and suspicions of possible secondary gains such as obtaining more narcotics, or wanting to appear disabled, for fraudulent reasons. 6. Only pain levels of 5 or below will be seen at our facility. 7. Pain levels of 6 and above will be sent to the Emergency Department and the appointment cancelled. ____________________________________________________________________________________________

## 2017-11-29 NOTE — Progress Notes (Signed)
Patient's Name: Wesley Adams  MRN: [id number]  Referring Provider: Steele Sizer, MD  DOB: 06-24-1965  PCP: Steele Sizer, MD  DOS: 11/29/2017  Note by: Vevelyn Francois NP  Service setting: Ambulatory outpatient  Specialty: Interventional Pain Management  Location: [facility] (AMB) Pain Management Facility    Patient type: Established    Primary Reason(s) for Visit: Encounter for prescription drug management. (Level of risk: moderate)  CC: Back Pain (lower)  HPI  Mr. Arth is a 52 y.o. year old, male patient, who comes today for a medication management evaluation. He has Lumbar degenerative disc disease; Acute low back pain without sciatica; Spinal cord stimulator status; Chronic, continuous use of opioids; Tobacco use; Epigastric pain; Special screening for malignant neoplasms, colon; Chronic bilateral low back pain with bilateral sciatica; Chronic pain syndrome; Opiate use; and Long term current use of opiate analgesic on their problem list. His primarily concern today is the Back Pain (lower)  Pain Assessment: Location: Lower Back Radiating: legs Duration: Chronic pain Severity: 9 /10 (subjective, self-reported pain score)  Note: Reported level is compatible with observation. Clinically the patient looks like a 2/10 A 2/10 is viewed as "Mild to Moderate" and described as noticeable and distracting. Impossible to hide from other people. More frequent flare-ups. Still possible to adapt and function close to normal. It can be very annoying and may have occasional stronger flare-ups. With discipline, patients may get used to it and adapt. Information on the proper use of the pain scale provided to the patient today. When using our objective Pain Scale, levels between 6 and 10/10 are said to belong in an emergency room, as it progressively worsens from a 6/10, described as severely limiting, requiring emergency care not usually available at an outpatient pain management facility. At a 6/10 level,  communication becomes difficult and requires great effort. Assistance to reach the emergency department may be required. Facial flushing and profuse sweating along with potentially dangerous increases in heart rate and blood pressure will be evident. Effect on ADL: difficulty sleeping and walking Modifying factors: meds, rest, heat BP: (!) 153/88  HR: 70  [name] was last scheduled for an appointment on 11/02/2017 for medication management. During today's appointment we reviewed Mr. Dall chronic pain status, as well as his outpatient medication regimen. He admits that his pain has increase. He is not sure what he has done. He admits that has thought about going to the ER. He has started [employer]. He is considering the [facility] again. He is really wants to have this.   The patient  reports that he does not use drugs. His body mass index is 27.71 kg/m.  Further details on both, my assessment(s), as well as the proposed treatment plan, please see below.  Controlled Substance Pharmacotherapy Assessment REMS (Risk Evaluation and Mitigation Strategy)  Analgesic: Hydrocodone /APAP 10/341m QID MME/day: 40 mg/day.  WRise Patience RN  11/29/2017  2:57 PM  Signed Nursing Pain Medication Assessment:  Safety precautions to be maintained throughout the outpatient stay will include: orient to surroundings, keep bed in low position, maintain call bell within reach at all times, provide assistance with transfer out of bed and ambulation.  Medication Inspection Compliance: Pill count conducted under aseptic conditions, in front of the patient. Neither the pills nor the bottle was removed from the patient's sight at any time. Once count was completed pills were immediately returned to the patient in their original bottle.  Medication: Hydrocodone/APAP Pill/Patch Count: 1 of 120 pills remain Pill/Patch  Appearance: Markings consistent with prescribed medication Bottle Appearance: Standard pharmacy  container. Clearly labeled. Filled Date: 7 / 8 / 2019 Last Medication intake:  Today   Pharmacokinetics: Liberation and absorption (onset of action): WNL Distribution (time to peak effect): WNL Metabolism and excretion (duration of action): WNL         Pharmacodynamics: Desired effects: Analgesia: Mr. Esson reports >50% benefit. Functional ability: Patient reports that medication allows him to accomplish basic ADLs Clinically meaningful improvement in function (CMIF): Sustained CMIF goals met Perceived effectiveness: Described as relatively effective, allowing for increase in activities of daily living (ADL) Undesirable effects: Side-effects or Adverse reactions: None reported Monitoring:  PMP: Online review of the past 74-monthperiod conducted. Compliant with practice rules and regulations Last UDS on record: Summary  Date Value Ref Range Status  10/03/2017 FINAL  Final    Comment:    ==================================================================== TOXASSURE SELECT 13 (MW) ==================================================================== Test                             Result       Flag       Units Drug Present not Declared for Prescription Verification   Carboxy-THC                    52           UNEXPECTED ng/mg creat    Carboxy-THC is a metabolite of tetrahydrocannabinol  (THC).    [facility], but THC is also present    in a scheduled prescription medication. ==================================================================== Test                      Result    Flag   Units      Ref Range   Creatinine              63               mg/dL      >=20 ==================================================================== Declared Medications:  The flagging and interpretation on this report are based on the  following declared medications.  Unexpected results may arise from  inaccuracies in the declared medications.  **Note: The testing  scope of this panel does not include following  reported medications:  Omeprazole  Tizanidine ==================================================================== For clinical consultation, please call ([phone] ====================================================================    UDS interpretation: Compliant          Medication Assessment Form: Reviewed. Patient indicates being compliant with therapy Treatment compliance: Compliant Risk Assessment Profile: Aberrant behavior: See prior evaluations. None observed or detected today Comorbid factors increasing risk of overdose: See prior notes. No additional risks detected today Risk of substance use disorder (SUD): Low Opioid Risk Tool - 11/29/17 1457      Family History of Substance Abuse   Alcohol  Negative    Illegal Drugs  Negative    Rx Drugs  Negative      Personal History of Substance Abuse   Alcohol  Negative    Illegal Drugs  Positive Male or Male    Rx Drugs  Negative      Age   Age between 127-45years   No      History of Preadolescent Sexual Abuse   History of Preadolescent Sexual Abuse  Negative or Male      Psychological Disease   Psychological Disease  Negative    Depression  Negative  Total Score   Opioid Risk Tool Scoring  4    Opioid Risk Interpretation  Moderate Risk      ORT Scoring interpretation table:  Score <3 = Low Risk for SUD  Score between 4-7 = Moderate Risk for SUD  Score >8 = High Risk for Opioid Abuse   Risk Mitigation Strategies:  Patient Counseling: Covered Patient-Prescriber Agreement (PPA): Present and active  Notification to other healthcare providers: Done  Pharmacologic Plan: No change in therapy, at this time.             Laboratory Chemistry  Inflammation Markers (CRP: Acute Phase) (ESR: Chronic Phase) No results found for: CRP, ESRSEDRATE, LATICACIDVEN                       Rheumatology Markers No results found for: RF, ANA, LABURIC, URICUR,  LYMEIGGIGMAB, LYMEABIGMQN, HLAB27                      Renal Function Markers Lab Results  Component Value Date   BUN 11 08/02/2017   CREATININE 0.88 54/56/2563   BCR NOT APPLICABLE 89/37/3428   GFRAA >60 08/02/2017   GFRNONAA >60 08/02/2017                             Hepatic Function Markers Lab Results  Component Value Date   AST 27 08/02/2017   ALT 23 08/02/2017   ALBUMIN 4.6 08/02/2017   ALKPHOS 75 08/02/2017   LIPASE 28 08/02/2017                        Electrolytes Lab Results  Component Value Date   NA 137 08/02/2017   K 3.9 08/02/2017   CL 106 08/02/2017   CALCIUM 9.4 08/02/2017                        Neuropathy Markers Lab Results  Component Value Date   HIV NONREACTIVE 01/07/2016                        Bone Pathology Markers No results found for: [place], [id number], BT5974BU3, AG5364WO0, 25OHVITD1, 25OHVITD2, 25OHVITD3, TESTOFREE, TESTOSTERONE                       Coagulation Parameters Lab Results  Component Value Date   PLT 189 08/02/2017                        Cardiovascular Markers Lab Results  Component Value Date   HGB 16.7 08/02/2017   HCT 50.5 08/02/2017                         CA Markers No results found for: CEA, CA125, LABCA2                      Note: Lab results reviewed.  Recent Diagnostic Imaging Results  DG C-Arm 1-60 Min-No Report Fluoroscopy was utilized by the requesting physician.  No radiographic  interpretation.   Complexity Note: Imaging results reviewed. Results shared with Mr. Sayegh, using Layman's terms.                         Meds   Current Outpatient Medications:  .  HYDROcodone-acetaminophen (NORCO) 10-325 MG tablet, Take 1 tablet by mouth every 6 (six) hours as needed for severe pain., Disp: 120 tablet, Rfl: 0 .  tiZANidine (ZANAFLEX) 4 MG tablet, Take 1 tablet (4 mg total) by mouth every 8 (eight) hours as needed for muscle spasms., Disp: 90 tablet, Rfl: 1  ROS  Constitutional: Denies any fever or  chills Gastrointestinal: No reported hemesis, hematochezia, vomiting, or acute GI distress Musculoskeletal: Denies any acute onset joint swelling, redness, loss of ROM, or weakness Neurological: No reported episodes of acute onset apraxia, aphasia, dysarthria, agnosia, amnesia, paralysis, loss of coordination, or loss of consciousness  Allergies  Mr. Cerreta has No Known Allergies.  [place]  Drug: Mr. Couper  reports that he does not use drugs. Alcohol:  reports that he drinks about 1.8 oz of alcohol per week. Tobacco:  reports that he has been smoking cigarettes.  He has a 25.00 pack-year smoking history. He has never used smokeless tobacco. Medical:  has a past medical history of Chronic back pain. Surgical: Mr. Nixon  has a past surgical history that includes Back surgery (Feb 2016); Spinal cord stimulator insertion (03/23/2017); Colonoscopy with propofol (N/A, 06/01/2017); Esophagogastroduodenoscopy (egd) with propofol (06/01/2017); polypectomy (06/01/2017); Spinal cord stimulator removal (N/A, 06/16/2017); and Fracture surgery (Right, as child). Family: family history includes Alcohol abuse in his mother; Heart attack in his father; Heart disease in his father; Obesity in his father.  Constitutional Exam  General appearance: Well nourished, well developed, and well hydrated. In no apparent acute distress Vitals:   11/29/17 1448 11/29/17 1452  BP: (!) 157/97 (!) 153/88  Pulse: 70   Resp: 16   Temp: 97.8 F (36.6 C)   SpO2: 99%   Weight: 210 lb (95.3 kg)   Height: 6' 1"  (1.854 m)    BMI Assessment: Estimated body mass index is 27.71 kg/m as calculated from the following:   Height as of this encounter: 6' 1"  (1.854 m).   Weight as of this encounter: 210 lb (95.3 kg).  BMI interpretation table: BMI level Category Range association with higher incidence of chronic pain  <18 kg/m2 Underweight   18.5-24.9 kg/m2 Ideal body weight   25-29.9 kg/m2 Overweight Increased incidence by 20%   30-34.9 kg/m2 Obese (Class I) Increased incidence by 68%  35-39.9 kg/m2 Severe obesity (Class II) Increased incidence by 136%  >40 kg/m2 Extreme obesity (Class III) Increased incidence by 254%   Patient's current BMI Ideal Body weight  Body mass index is 27.71 kg/m. Ideal body weight: 79.9 kg (176 lb 2.4 oz) Adjusted ideal body weight: 86 kg (189 lb 11 oz)   BMI Readings from Last 4 Encounters:  11/29/17 27.71 kg/m  11/02/17 27.71 kg/m  10/03/17 27.71 kg/m  09/12/17 27.80 kg/m   Wt Readings from Last 4 Encounters:  11/29/17 210 lb (95.3 kg)  11/02/17 210 lb (95.3 kg)  10/03/17 210 lb (95.3 kg)  09/12/17 205 lb (93 kg)  Psych/Mental status: Alert, oriented x 3 (person, place, & time)       Eyes: PERLA Respiratory: No evidence of acute respiratory distress  Cervical Spine Area Exam  Skin & Axial Inspection: No masses, redness, edema, swelling, or associated skin lesions Alignment: Symmetrical Functional ROM: Unrestricted ROM      Stability: No instability detected Muscle Tone/Strength: Functionally intact. No obvious neuro-muscular anomalies detected. Sensory (Neurological): Unimpaired Palpation: No palpable anomalies              Upper Extremity (UE) Exam    Side:  Right upper extremity  Side: Left upper extremity  Skin & Extremity Inspection: Skin color, temperature, and hair growth are WNL. No peripheral edema or cyanosis. No masses, redness, swelling, asymmetry, or associated skin lesions. No contractures.  Skin & Extremity Inspection: Skin color, temperature, and hair growth are WNL. No peripheral edema or cyanosis. No masses, redness, swelling, asymmetry, or associated skin lesions. No contractures.  Functional ROM: Unrestricted ROM          Functional ROM: Unrestricted ROM          Muscle Tone/Strength: Functionally intact. No obvious neuro-muscular anomalies detected.  Muscle Tone/Strength: Functionally intact. No obvious neuro-muscular anomalies detected.  Sensory  (Neurological): Unimpaired          Sensory (Neurological): Unimpaired          Palpation: No palpable anomalies              Palpation: No palpable anomalies              Provocative Test(s):  Phalen's test: deferred Tinel's test: deferred Apley's scratch test (touch opposite shoulder):  Action 1 (Across chest): deferred Action 2 (Overhead): deferred Action 3 (LB reach): deferred   Provocative Test(s):  Phalen's test: deferred Tinel's test: deferred Apley's scratch test (touch opposite shoulder):  Action 1 (Across chest): deferred Action 2 (Overhead): deferred Action 3 (LB reach): deferred    Thoracic Spine Area Exam  Skin & Axial Inspection: No masses, redness, or swelling Alignment: Symmetrical Functional ROM: Unrestricted ROM Stability: No instability detected Muscle Tone/Strength: Functionally intact. No obvious neuro-muscular anomalies detected. Sensory (Neurological): Unimpaired Muscle strength & Tone: No palpable anomalies  Lumbar Spine Area Exam  Skin & Axial Inspection: No masses, redness, or swelling Alignment: Symmetrical Functional ROM: Unrestricted ROM       Stability: No instability detected Muscle Tone/Strength: Functionally intact. No obvious neuro-muscular anomalies detected. Sensory (Neurological): Unimpaired Palpation: No palpable anomalies       Provocative Tests: Hyperextension/rotation test: deferred today       Lumbar quadrant test (Kemp's test): deferred today       Lateral bending test: deferred today       Patrick's Maneuver: deferred today                   FABER test: deferred today                   S-I anterior distraction/compression test: deferred today         S-I lateral compression test: deferred today         S-I Thigh-thrust test: deferred today         S-I Gaenslen's test: deferred today           Gait & Posture Assessment  Ambulation: Unassisted Gait: Relatively normal for age and body habitus Posture: WNL   Lower Extremity  Exam    Side: Right lower extremity  Side: Left lower extremity  Stability: No instability observed          Stability: No instability observed          Skin & Extremity Inspection: Skin color, temperature, and hair growth are WNL. No peripheral edema or cyanosis. No masses, redness, swelling, asymmetry, or associated skin lesions. No contractures.  Skin & Extremity Inspection: Skin color, temperature, and hair growth are WNL. No peripheral edema or cyanosis. No masses, redness, swelling, asymmetry, or associated skin lesions. No contractures.  Functional ROM: Unrestricted ROM  Functional ROM: Unrestricted ROM                  Muscle Tone/Strength: Functionally intact. No obvious neuro-muscular anomalies detected.  Muscle Tone/Strength: Functionally intact. No obvious neuro-muscular anomalies detected.  Sensory (Neurological): Unimpaired  Sensory (Neurological): Unimpaired  Palpation: No palpable anomalies  Palpation: No palpable anomalies   Assessment  Primary Diagnosis & Pertinent Problem List: The primary encounter diagnosis was Chronic bilateral low back pain with bilateral sciatica. Diagnoses of Lumbar degenerative disc disease, Chronic pain syndrome, and Long term current use of opiate analgesic were also pertinent to this visit.  Status Diagnosis  Controlled Controlled Controlled 1. Chronic bilateral low back pain with bilateral sciatica   2. Lumbar degenerative disc disease   3. Chronic pain syndrome   4. Long term current use of opiate analgesic     Problems updated and reviewed during this visit: No problems updated. Plan of Care  Pharmacotherapy (Medications Ordered): No orders of the defined types were placed in this encounter.  New Prescriptions   No medications on file   Medications administered today: Teaghan Formica. Maragh had no medications administered during this visit. Lab-work, procedure(s), and/or referral(s): No orders of the defined types were  placed in this encounter.  Imaging and/or referral(s): None  Interventional therapies: Planned, scheduled, and/or pending:   Not at this time.  Provider-requested follow-up: Return in about 1 month (around 12/27/2017) for MedMgmt with Me Donella Stade Edison Pace).  No future appointments. Primary Care Physician: Steele Sizer, MD Location: [facility] Note by: Vevelyn Francois NP Date: 11/29/2017; Time: 3:18 PM  Pain Score Disclaimer: We use the NRS-11 scale. This is a self-reported, subjective measurement of pain severity with only modest accuracy. It is used primarily to identify changes within a particular patient. It must be understood that outpatient pain scales are significantly less accurate that those used for research, where they can be applied under ideal controlled circumstances with minimal exposure to variables. In reality, the score is likely to be a combination of pain intensity and pain affect, where pain affect describes the degree of emotional arousal or changes in action readiness caused by the sensory experience of pain. Factors such as social and work situation, setting, emotional state, anxiety levels, expectation, and prior pain experience may influence pain perception and show large inter-individual differences that may also be affected by time variables.  Patient instructions provided during this appointment: Patient Instructions   ____________________________________________________________________________________________  Medication Rules  Applies to: All patients receiving prescriptions (written or electronic).  Pharmacy of record: Pharmacy where electronic prescriptions will be sent. If written prescriptions are taken to a different pharmacy, please inform the nursing staff. The pharmacy listed in the electronic medical record should be the one where you would like electronic prescriptions to be sent.  Prescription refills: Only during  scheduled appointments. Applies to both, written and electronic prescriptions.  NOTE: The following applies primarily to controlled substances (Opioid* Pain Medications).   Patient's responsibilities: 1. Pain Pills: Bring all pain pills to every appointment (except for procedure appointments). 2. Pill Bottles: Bring pills in original pharmacy bottle. Always bring newest bottle. Bring bottle, even if empty. 3. Medication refills: You are responsible for knowing and keeping track of what medications you need refilled. The day before your appointment, write a list of all prescriptions that need to be refilled. Bring that list to your appointment and give it to the admitting nurse. Prescriptions will be written only during appointments. If you  forget a medication, it will not be "Called in", "Faxed", or "electronically sent". You will need to get another appointment to get these prescribed. 4. Prescription Accuracy: You are responsible for carefully inspecting your prescriptions before leaving our office. Have the discharge nurse carefully go over each prescription with you, before taking them home. Make sure that your name is accurately spelled, that your address is correct. Check the name and dose of your medication to make sure it is accurate. Check the number of pills, and the written instructions to make sure they are clear and accurate. Make sure that you are given enough medication to last until your next medication refill appointment. 5. Taking Medication: Take medication as prescribed. Never take more pills than instructed. Never take medication more frequently than prescribed. Taking less pills or less frequently is permitted and encouraged, when it comes to controlled substances (written prescriptions).  6. Inform other Doctors: Always inform, all of your healthcare providers, of all the medications you take. 7. Pain Medication from other Providers: You are not allowed to accept any additional pain  medication from any other Doctor or Healthcare provider. There are two exceptions to this rule. (see below) In the event that you require additional pain medication, you are responsible for notifying us, as stated below. 8. Medication Agreement: You are responsible for carefully reading and following our Medication Agreement. This must be signed before receiving any prescriptions from our practice. Safely store a copy of your signed Agreement. Violations to the Agreement will result in no further prescriptions. (Additional copies of our Medication Agreement are available upon request.) 9. Laws, Rules, & Regulations: All patients are expected to follow all Federal and [employer], [employer], Rules, [employer]. Ignorance of the Laws does not constitute a valid excuse. The use of any illegal substances is prohibited. 10. Adopted CDC guidelines & recommendations: Target dosing levels will be at or below 60 MME/day. Use of benzodiazepines** is not recommended.  Exceptions: There are only two exceptions to the rule of not receiving pain medications from other Healthcare Providers. 1. Exception #1 (Emergencies): In the event of an emergency (i.e.: accident requiring emergency care), you are allowed to receive additional pain medication. However, you are responsible for: As soon as you are able, call our office [phone], at any time of the day or night, and leave a message stating your name, the date and nature of the emergency, and the name and dose of the medication prescribed. In the event that your call is answered by a member of our staff, make sure to document and save the date, time, and the name of the person that took your information.  2. Exception #2 (Planned Surgery): In the event that you are scheduled by another doctor or dentist to have any type of surgery or procedure, you are allowed (for a period no longer than 30 days), to receive additional pain medication, for the acute post-op pain.  However, in this case, you are responsible for picking up a copy of our "Post-op Pain Management for Surgeons" handout, and giving it to your surgeon or dentist. This document is available at our office, and does not require an appointment to obtain it. Simply go to our office during business hours (Monday-Thursday from 8:00 AM to 4:00 PM) (Friday 8:00 AM to 12:00 Noon) or if you have a scheduled appointment with [place], prior to your surgery, and ask for it by name. In addition, you will need to provide [place] with your name, name  of your surgeon, type of surgery, and date of procedure or surgery.  *Opioid medications include: morphine, codeine, oxycodone, oxymorphone, hydrocodone, hydromorphone, meperidine, tramadol, tapentadol, buprenorphine, fentanyl, methadone. **Benzodiazepine medications include: diazepam (Valium), alprazolam (Xanax), clonazepam (Klonopine), lorazepam (Ativan), clorazepate (Tranxene), chlordiazepoxide (Librium), estazolam (Prosom), oxazepam (Serax), temazepam (Restoril), triazolam (Halcion) (Last updated: 06/23/2017) ____________________________________________________________________________________________   ____________________________________________________________________________________________  Pain Scale  Introduction: The pain score used by this practice is the Verbal Numerical Rating Scale (VNRS-11). This is an 11-point scale. It is for adults and children 10 years or older. There are significant differences in how the pain score is reported, used, and applied. Forget everything you learned in the past and learn this scoring system.  General Information: The scale should reflect your current level of pain. Unless you are specifically asked for the level of your worst pain, or your average pain. If you are asked for one of these two, then it should be understood that it is over the past 24 hours.  Basic Activities of Daily Living (ADL): Personal hygiene, dressing, eating,  transferring, and using restroom.  Instructions: Most patients tend to report their level of pain as a combination of two factors, their physical pain and their psychosocial pain. This last one is also known as "suffering" and it is reflection of how physical pain affects you socially and psychologically. From now on, report them separately. From this point on, when asked to report your pain level, report only your physical pain. Use the following table for reference.  Pain Clinic Pain Levels (0-5/10)  Pain Level Score  Description  No Pain 0   Mild pain 1 Nagging, annoying, but does not interfere with basic activities of daily living (ADL). Patients are able to eat, bathe, get dressed, toileting (being able to get on and off the toilet and perform personal hygiene functions), transfer (move in and out of bed or a chair without assistance), and maintain continence (able to control bladder and bowel functions). Blood pressure and heart rate are unaffected. A normal heart rate for a healthy adult ranges from 60 to 100 bpm (beats per minute).   Mild to moderate pain 2 Noticeable and distracting. Impossible to hide from other people. More frequent flare-ups. Still possible to adapt and function close to normal. It can be very annoying and may have occasional stronger flare-ups. With discipline, patients may get used to it and adapt.   Moderate pain 3 Interferes significantly with activities of daily living (ADL). It becomes difficult to feed, bathe, get dressed, get on and off the toilet or to perform personal hygiene functions. Difficult to get in and out of bed or a chair without assistance. Very distracting. With effort, it can be ignored when deeply involved in activities.   Moderately severe pain 4 Impossible to ignore for more than a few minutes. With effort, patients may still be able to manage work or participate in some social activities. Very difficult to concentrate. Signs of autonomic nervous  system discharge are evident: dilated pupils (mydriasis); mild sweating (diaphoresis); sleep interference. Heart rate becomes elevated (>115 bpm). Diastolic blood pressure (lower number) rises above 100 mmHg. Patients find relief in laying down and not moving.   Severe pain 5 Intense and extremely unpleasant. Associated with frowning face and frequent crying. Pain overwhelms the senses.  Ability to do any activity or maintain social relationships becomes significantly limited. Conversation becomes difficult. Pacing back and forth is common, as getting into a comfortable position is nearly impossible. Pain wakes you up from  deep sleep. Physical signs will be obvious: pupillary dilation; increased sweating; goosebumps; brisk reflexes; cold, clammy hands and feet; nausea, vomiting or dry heaves; loss of appetite; significant sleep disturbance with inability to fall asleep or to remain asleep. When persistent, significant weight loss is observed due to the complete loss of appetite and sleep deprivation.  Blood pressure and heart rate becomes significantly elevated. Caution: If elevated blood pressure triggers a pounding headache associated with blurred vision, then the patient should immediately seek attention at an urgent or emergency care unit, as these may be signs of an impending stroke.    Emergency Department Pain Levels (6-10/10)  Emergency Room Pain 6 Severely limiting. Requires emergency care and should not be seen or managed at an outpatient pain management facility. Communication becomes difficult and requires great effort. Assistance to reach the emergency department may be required. Facial flushing and profuse sweating along with potentially dangerous increases in heart rate and blood pressure will be evident.   Distressing pain 7 Self-care is very difficult. Assistance is required to transport, or use restroom. Assistance to reach the emergency department will be required. Tasks requiring  coordination, such as bathing and getting dressed become very difficult.   Disabling pain 8 Self-care is no longer possible. At this level, pain is disabling. The individual is unable to do even the most "basic" activities such as walking, eating, bathing, dressing, transferring to a bed, or toileting. Fine motor skills are lost. It is difficult to think clearly.   Incapacitating pain 9 Pain becomes incapacitating. Thought processing is no longer possible. Difficult to remember your own name. Control of movement and coordination are lost.   The worst pain imaginable 10 At this level, most patients pass out from pain. When this level is reached, collapse of the autonomic nervous system occurs, leading to a sudden drop in blood pressure and heart rate. This in turn results in a temporary and dramatic drop in blood flow to the brain, leading to a loss of consciousness. Fainting is one of the body's self defense mechanisms. Passing out puts the brain in a calmed state and causes it to shut down for a while, in order to begin the healing process.    Summary: 1. Refer to this scale when providing [place] with your pain level. 2. Be accurate and careful when reporting your pain level. This will help with your care. 3. Over-reporting your pain level will lead to loss of credibility. 4. Even a level of 1/10 means that there is pain and will be treated at our facility. 5. High, inaccurate reporting will be documented as "Symptom Exaggeration", leading to loss of credibility and suspicions of possible secondary gains such as obtaining more narcotics, or wanting to appear disabled, for fraudulent reasons. 6. Only pain levels of 5 or below will be seen at our facility. 7. Pain levels of 6 and above will be sent to the Emergency Department and the appointment cancelled. ____________________________________________________________________________________________

## 2017-12-03 LAB — TOXASSURE SELECT 13 (MW), URINE

## 2017-12-06 ENCOUNTER — Encounter: Payer: Self-pay | Admitting: Family Medicine

## 2017-12-06 ENCOUNTER — Encounter: Payer: Self-pay | Admitting: Radiology

## 2017-12-06 ENCOUNTER — Emergency Department: Payer: BLUE CROSS/BLUE SHIELD

## 2017-12-06 ENCOUNTER — Other Ambulatory Visit: Payer: Self-pay

## 2017-12-06 ENCOUNTER — Observation Stay
Admission: EM | Admit: 2017-12-06 | Discharge: 2017-12-08 | Disposition: A | Discharge status: Home / Self Care | Payer: BLUE CROSS/BLUE SHIELD | Attending: Internal Medicine | Admitting: Internal Medicine

## 2017-12-06 ENCOUNTER — Ambulatory Visit: Payer: BLUE CROSS/BLUE SHIELD | Admitting: Family Medicine

## 2017-12-06 VITALS — BP 132/86 | HR 77 | Temp 98.4°F | Ht 73.0 in | Wt 214.4 lb

## 2017-12-06 DIAGNOSIS — G894 Chronic pain syndrome: Secondary | ICD-10-CM | POA: Insufficient documentation

## 2017-12-06 DIAGNOSIS — Z8249 Family history of ischemic heart disease and other diseases of the circulatory system: Secondary | ICD-10-CM | POA: Diagnosis not present

## 2017-12-06 DIAGNOSIS — R200 Anesthesia of skin: Secondary | ICD-10-CM | POA: Diagnosis not present

## 2017-12-06 DIAGNOSIS — R079 Chest pain, unspecified: Secondary | ICD-10-CM | POA: Diagnosis not present

## 2017-12-06 DIAGNOSIS — I771 Stricture of artery: Secondary | ICD-10-CM | POA: Insufficient documentation

## 2017-12-06 DIAGNOSIS — R319 Hematuria, unspecified: Secondary | ICD-10-CM | POA: Insufficient documentation

## 2017-12-06 DIAGNOSIS — M5441 Lumbago with sciatica, right side: Secondary | ICD-10-CM | POA: Insufficient documentation

## 2017-12-06 DIAGNOSIS — Z23 Encounter for immunization: Secondary | ICD-10-CM | POA: Insufficient documentation

## 2017-12-06 DIAGNOSIS — Z981 Arthrodesis status: Secondary | ICD-10-CM | POA: Diagnosis not present

## 2017-12-06 DIAGNOSIS — I700 Atherosclerosis of aorta: Secondary | ICD-10-CM | POA: Insufficient documentation

## 2017-12-06 DIAGNOSIS — I2510 Atherosclerotic heart disease of native coronary artery without angina pectoris: Secondary | ICD-10-CM | POA: Insufficient documentation

## 2017-12-06 DIAGNOSIS — I10 Essential (primary) hypertension: Secondary | ICD-10-CM | POA: Insufficient documentation

## 2017-12-06 DIAGNOSIS — Z720 Tobacco use: Secondary | ICD-10-CM

## 2017-12-06 DIAGNOSIS — E7800 Pure hypercholesterolemia, unspecified: Secondary | ICD-10-CM | POA: Insufficient documentation

## 2017-12-06 DIAGNOSIS — R0789 Other chest pain: Principal | ICD-10-CM | POA: Insufficient documentation

## 2017-12-06 DIAGNOSIS — F17210 Nicotine dependence, cigarettes, uncomplicated: Secondary | ICD-10-CM | POA: Diagnosis not present

## 2017-12-06 DIAGNOSIS — N281 Cyst of kidney, acquired: Secondary | ICD-10-CM | POA: Insufficient documentation

## 2017-12-06 DIAGNOSIS — R0602 Shortness of breath: Secondary | ICD-10-CM

## 2017-12-06 LAB — CBC
HCT: 43.6 % (ref 40.0–52.0)
HCT: 42 % (ref 40.0–52.0)
HEMOGLOBIN: 14.9 g/dL (ref 13.0–18.0)
Hemoglobin: 14.3 g/dL (ref 13.0–18.0)
MCH: 31.2 pg (ref 26.0–34.0)
MCH: 30.8 pg (ref 26.0–34.0)
MCHC: 34.2 g/dL (ref 32.0–36.0)
MCHC: 34 g/dL (ref 32.0–36.0)
MCV: 91.3 fL (ref 80.0–100.0)
MCV: 90.7 fL (ref 80.0–100.0)
Platelets: 183 10*3/uL (ref 150–440)
Platelets: 175 10*3/uL (ref 150–440)
RBC: 4.77 MIL/uL (ref 4.40–5.90)
RBC: 4.63 MIL/uL (ref 4.40–5.90)
RDW: 14.2 % (ref 11.5–14.5)
RDW: 14.2 % (ref 11.5–14.5)
WBC: 9.2 10*3/uL (ref 3.8–10.6)
WBC: 7.3 10*3/uL (ref 3.8–10.6)

## 2017-12-06 LAB — URINALYSIS, COMPLETE (UACMP) WITH MICROSCOPIC
Bacteria, UA: NONE SEEN
Bilirubin Urine: NEGATIVE
Glucose, UA: NEGATIVE mg/dL
Ketones, ur: NEGATIVE mg/dL
Leukocytes, UA: NEGATIVE
Nitrite: NEGATIVE
Protein, ur: NEGATIVE mg/dL
Specific Gravity, Urine: 1.009 (ref 1.005–1.030)
Squamous Epithelial / HPF: NONE SEEN (ref 0–5)
WBC, UA: NONE SEEN WBC/hpf (ref 0–5)
pH: 6 (ref 5.0–8.0)

## 2017-12-06 LAB — APTT: aPTT: 29 s (ref 24–36)

## 2017-12-06 LAB — LIPASE, BLOOD: Lipase: 41 U/L (ref 11–51)

## 2017-12-06 LAB — COMPREHENSIVE METABOLIC PANEL
ALBUMIN: 4.4 g/dL (ref 3.5–5.0)
ALK PHOS: 64 U/L (ref 38–126)
ALT: 21 U/L (ref 0–44)
ANION GAP: 6 (ref 5–15)
AST: 24 U/L (ref 15–41)
BUN: 12 mg/dL (ref 6–20)
CALCIUM: 9.5 mg/dL (ref 8.9–10.3)
CO2: 26 mmol/L (ref 22–32)
CREATININE: 0.92 mg/dL (ref 0.61–1.24)
Chloride: 105 mmol/L (ref 98–111)
GFR calc Af Amer: >60 mL/min (ref >60)
GFR calc non Af Amer: >60 mL/min (ref >60)
GLUCOSE: 92 mg/dL (ref 70–99)
Potassium: 4.1 mmol/L (ref 3.5–5.1)
Sodium: 137 mmol/L (ref 135–145)
Total Bilirubin: 0.6 mg/dL (ref 0.3–1.2)
Total Protein: 7.6 g/dL (ref 6.5–8.1)

## 2017-12-06 LAB — TROPONIN I
TROPONIN I: 0.03 ng/mL — AB (ref <0.03)
Troponin I: 0.03 ng/mL (ref <0.03)

## 2017-12-06 LAB — PROTIME-INR
INR: 0.95
Prothrombin Time: 12.6 s (ref 11.4–15.2)

## 2017-12-06 LAB — CREATININE, SERUM
CREATININE: 0.98 mg/dL (ref 0.61–1.24)
GFR calc Af Amer: >60 mL/min (ref >60)
GFR calc non Af Amer: >60 mL/min (ref >60)

## 2017-12-06 LAB — BRAIN NATRIURETIC PEPTIDE: B Natriuretic Peptide: 13 pg/mL (ref 0.0–100.0)

## 2017-12-06 MED ORDER — PNEUMOCOCCAL VAC POLYVALENT 25 MCG/0.5ML IJ INJ
0.5000 mL | INJECTION | INTRAMUSCULAR | Status: AC
Start: 2017-12-07 — End: 2017-12-07
  Administered 2017-12-07: 0.5 mL via INTRAMUSCULAR
  Filled 2017-12-06: qty 0.5

## 2017-12-06 MED ORDER — ONDANSETRON HCL 4 MG PO TABS: 4.0000 mg | ORAL_TABLET | Freq: Four times a day (QID) | ORAL | Status: DC | PRN

## 2017-12-06 MED ORDER — NICOTINE 21 MG/24HR TD PT24
21.0000 mg | MEDICATED_PATCH | Freq: Every day | TRANSDERMAL | Status: DC
  Administered 2017-12-06 – 2017-12-08 (×3): 21 mg via TRANSDERMAL
  Filled 2017-12-06 (×3): qty 1

## 2017-12-06 MED ORDER — ENOXAPARIN SODIUM 40 MG/0.4ML ~~LOC~~ SOLN
40.0000 mg | SUBCUTANEOUS | Status: DC
  Administered 2017-12-06 – 2017-12-07 (×2): 40 mg via SUBCUTANEOUS
  Filled 2017-12-06 (×2): qty 0.4

## 2017-12-06 MED ORDER — NITROGLYCERIN 0.4 MG SL SUBL
0.4000 mg | SUBLINGUAL_TABLET | SUBLINGUAL | Status: DC | PRN
  Administered 2017-12-06 (×3): 0.4 mg via SUBLINGUAL
  Filled 2017-12-06 (×2): qty 1

## 2017-12-06 MED ORDER — NITROGLYCERIN 0.4 MG SL SUBL: 0.4000 mg | SUBLINGUAL_TABLET | SUBLINGUAL | Status: DC | PRN

## 2017-12-06 MED ORDER — NITROGLYCERIN 2 % TD OINT
1.0000 [in_us] | TOPICAL_OINTMENT | TRANSDERMAL | Status: AC
  Administered 2017-12-06: 1 [in_us] via TOPICAL
  Filled 2017-12-06: qty 1

## 2017-12-06 MED ORDER — ACETAMINOPHEN 650 MG RE SUPP: 650.0000 mg | Freq: Four times a day (QID) | RECTAL | Status: DC | PRN

## 2017-12-06 MED ORDER — ONDANSETRON HCL 4 MG/2ML IJ SOLN: 4.0000 mg | Freq: Four times a day (QID) | INTRAMUSCULAR | Status: DC | PRN

## 2017-12-06 MED ORDER — HYDROCODONE-ACETAMINOPHEN 10-325 MG PO TABS
1.0000 | ORAL_TABLET | Freq: Four times a day (QID) | ORAL | Status: DC | PRN
  Administered 2017-12-06 – 2017-12-07 (×3): 1 via ORAL
  Filled 2017-12-06 (×3): qty 1

## 2017-12-06 MED ORDER — ACETAMINOPHEN 325 MG PO TABS
650.0000 mg | ORAL_TABLET | Freq: Four times a day (QID) | ORAL | Status: DC | PRN
Start: 2017-12-06 — End: 2017-12-08

## 2017-12-06 MED ORDER — IOHEXOL 350 MG/ML SOLN
100.0000 mL | Freq: Once | INTRAVENOUS | Status: AC | PRN
  Administered 2017-12-06: 100 mL via INTRAVENOUS

## 2017-12-06 MED ORDER — ASPIRIN EC 81 MG PO TBEC
81.0000 mg | DELAYED_RELEASE_TABLET | Freq: Every day | ORAL | Status: DC
  Administered 2017-12-07: 81 mg via ORAL
  Filled 2017-12-06: qty 1

## 2017-12-06 MED ORDER — TIZANIDINE HCL 4 MG PO TABS
4.0000 mg | ORAL_TABLET | Freq: Three times a day (TID) | ORAL | Status: DC | PRN
  Filled 2017-12-06: qty 1

## 2017-12-06 NOTE — ED Notes (Signed)
RN aware of bed assigned 

## 2017-12-06 NOTE — Progress Notes (Signed)
Name: Wesley Adams   MRN: [id number]    DOB: 06-18-1965   Date:12/06/2017       Progress Note  Subjective  Chief Complaint  Chief Complaint  Patient presents with  . Numbness    Left arm, onset Saturday. SOB. Chest pain on left side. Blood in urine.     HPI  Pt presents for acute visit today. He went to the beach over the weekend - Saturday morning he urinated and it was "nothing but blood" - urine is still darker than usual today and has been throughout the weekend.  Then later than evening he had LEFT arm numbness and LEFT chest pain - he chose not to go to the ER at that time; even later in the evening he noticed BLE edema which then decreased after 2 days. The next day (Sunday) he had continued chest pain which changed to tightness, LUE numbness, and started having shortness of breath. - Denies abdominal pain, nausea or vomiting, history of kidney stones, muscle aches. - Endorses: Diarrhea, chronic back pain, but notes increased pain on bilateral flanks, intermittent diaphoresis. - Family History: PGF - died from MI; father has had 3 triple bypasses; multiple paternal uncles with heart attacks. - Personal history: He has not been back for fasting labs/CPE - recent lipid levels are unknown.  Patient Active Problem List   Diagnosis Date Noted  . Long term current use of opiate analgesic 11/03/2017  . Chronic bilateral low back pain with bilateral sciatica 10/03/2017  . Chronic pain syndrome 10/03/2017  . Opiate use 10/03/2017  . Epigastric pain   . Special screening for malignant neoplasms, colon   . Chronic, continuous use of opioids 05/27/2017  . Tobacco use 05/27/2017  . Spinal cord stimulator status 04/29/2017  . Acute low back pain without sciatica 11/04/2015  . Lumbar degenerative disc disease 01/30/2015    Social History   Tobacco Use  . Smoking status: Current Every Day Smoker    Packs/day: 1.00    Years: 25.00    Pack years: 25.00    Types: Cigarettes  .  Smokeless tobacco: Never Used  . Tobacco comment: down to 1/2 PPD  Substance Use Topics  . Alcohol use: Yes    Alcohol/week: 3.0 standard drinks    Types: 3 Cans of beer per week    Comment: beer on weekend     Current Outpatient Medications:  .  HYDROcodone-acetaminophen (NORCO) 10-325 MG tablet, Take 1 tablet by mouth every 6 (six) hours as needed for severe pain., Disp: 120 tablet, Rfl: 0 .  tiZANidine (ZANAFLEX) 4 MG tablet, Take 1 tablet (4 mg total) by mouth every 8 (eight) hours as needed for muscle spasms., Disp: 90 tablet, Rfl: 1  No Known Allergies  ROS  Constitutional: Negative for fever or weight change.  Respiratory: Negative for cough and shortness of breath.   Cardiovascular: Negative for chest pain or palpitations.  Gastrointestinal: Negative for abdominal pain, no bowel changes.  Musculoskeletal: Negative for gait problem or joint swelling.  Skin: Negative for rash.  Neurological: Negative for dizziness or headache.  No other specific complaints in a complete review of systems (except as listed in HPI above).  Objective  Vitals:   12/06/17 1528  BP: 132/86  Pulse: 77  Temp: 98.4 F (36.9 C)  SpO2: 97%  Weight: 214 lb 6.4 oz (97.3 kg)  Height: 6\' 1"  (1.854 m)   Body mass index is 28.29 kg/m.  Nursing Note and Vital Signs reviewed.  Physical Exam  Constitutional: Patient appears well-developed and well-nourished. No distress.  HENT: Head: Normocephalic and atraumatic.Neck: No JVD present. No thyromegaly present.  Cardiovascular: Normal rate, regular rhythm and normal heart sounds.  No murmur heard. No BLE edema. +2 radial pulses. Pulmonary/Chest: Effort normal and breath sounds normal. No respiratory distress. Abdominal: Soft. Bowel sounds are normal, no distension. There is no tenderness. no masses. +CVA tenderness bilaterally. Musculoskeletal: Normal range of motion, no joint effusions. No gross deformities Neurological: he is alert and oriented to  person, place, and time. No cranial nerve deficit. Coordination, balance, strength, speech and gait are normal.  Skin: Skin is warm and dry. No rash noted. No erythema.  Psychiatric: Patient has a normal mood and affect. behavior is normal. Judgment and thought content normal.  No results found for this or any previous visit (from the past 72 hour(s)).  Assessment & Plan  1. Chest pain, unspecified type - EKG 12-Lead 2. Left arm numbness - EKG 12-Lead 3. Hematuria, unspecified type - POCT urinalysis dipstick -  Unable to provide sample prior to EMS transport. 4. Tobacco use - EKG 12-Lead 5. Shortness of breath - EKG 12-Lead  - EMS called due to patient's ongoing symptoms. [facility] presented for transport, report is given.  EKG shows sinus rhythm, no STEMI.

## 2017-12-06 NOTE — H&P (Signed)
[place] [facility] [place] NAME: Wesley Adams    MR#:  [id number]  DATE OF BIRTH:  [birth year]  DATE OF ADMISSION:  12/06/2017  PRIMARY CARE PHYSICIAN: Steele Sizer, MD   REQUESTING/REFERRING PHYSICIAN: Dr. Delman Kitten.   CHIEF COMPLAINT:   Chief Complaint  Patient presents with  . Chest Pain    HISTORY OF PRESENT ILLNESS:  Wesley Adams  is a 52 y.o. male with a known history of chronic back pain who presents to the hospital due to chest pain/left arm pain.  Patient was in his usual state of health until this past weekend he started to develop some pain in the center of his chest and his left arm became numb.  He has had these symptoms persistently throughout the entire weekend but dealt with it and today became somewhat short of breath with the symptoms and therefore came to the ER for further evaluation.  Patient was not noted to be hypoxic, but on routine blood work he was noted to have a mildly elevated troponin of 0.03.  Patient has no previous history of coronary disease, but he does have a strong family history of heart disease.  She is EKG done shows no acute ST or T wave changes.  Given his strong family history and ongoing tobacco abuse hospitalist services were contacted for admission.    PAST MEDICAL HISTORY:   Past Medical History:  Diagnosis Date  . Chronic back pain     PAST SURGICAL HISTORY:   Past Surgical History:  Procedure Laterality Date  . BACK SURGERY  Feb 2016   cut disc off of nerve with Dr Arnoldo Morale  . COLONOSCOPY WITH PROPOFOL N/A 06/01/2017   Procedure: COLONOSCOPY WITH PROPOFOL;  Surgeon: Lin Landsman, MD;  Location: [place];  Service: Endoscopy;  Laterality: N/A;  . ESOPHAGOGASTRODUODENOSCOPY (EGD) WITH PROPOFOL  06/01/2017   Procedure: ESOPHAGOGASTRODUODENOSCOPY (EGD) WITH PROPOFOL;  Surgeon: Lin Landsman, MD;  Location: [place];  Service: Endoscopy;;  . FRACTURE SURGERY  Right as child   rod in right femur  . POLYPECTOMY  06/01/2017   Procedure: POLYPECTOMY;  Surgeon: Lin Landsman, MD;  Location: [place];  Service: Endoscopy;;  . SPINAL CORD STIMULATOR INSERTION  03/23/2017   Dr Newman Pies, [facility]  . SPINAL CORD STIMULATOR REMOVAL N/A 06/16/2017   Procedure: SPINAL CORD STIMULATOR REMOVAL;  Surgeon: Newman Pies, MD;  Location: [place];  Service: Neurosurgery;  Laterality: N/A;    SOCIAL HISTORY:   Social History   Tobacco Use  . Smoking status: Current Every Day Smoker    Packs/day: 1.00    Years: 30.00    Pack years: 30.00    Types: Cigarettes  . Smokeless tobacco: Never Used  . Tobacco comment: down to 1/2 PPD  Substance Use Topics  . Alcohol use: Yes    Alcohol/week: 3.0 standard drinks    Types: 3 Cans of beer per week    Comment: beer on weekend    FAMILY HISTORY:   Family History  Problem Relation Age of Onset  . Alcohol abuse Mother   . Heart attack Father   . Heart disease Father   . Obesity Father   . Heart attack Paternal Grandfather     DRUG ALLERGIES:  No Known Allergies  REVIEW OF SYSTEMS:   Review of Systems  Constitutional: Negative for fever and weight loss.  HENT: Negative for congestion, nosebleeds and tinnitus.   Eyes: Negative  for blurred vision, double vision and redness.  Respiratory: Negative for cough, hemoptysis and shortness of breath.   Cardiovascular: Positive for chest pain. Negative for orthopnea, leg swelling and PND.  Gastrointestinal: Negative for abdominal pain, diarrhea, melena, nausea and vomiting.  Genitourinary: Negative for dysuria, hematuria and urgency.  Musculoskeletal: Negative for falls and joint pain.  Neurological: Positive for sensory change (Left arm numbness). Negative for dizziness, tingling, focal weakness, seizures, weakness and headaches.  Endo/Heme/Allergies: Negative for polydipsia. Does not bruise/bleed easily.   Psychiatric/Behavioral: Negative for depression and memory loss. The patient is not nervous/anxious.     MEDICATIONS AT HOME:   Prior to Admission medications   Medication Sig Start Date End Date Taking? Authorizing Provider  HYDROcodone-acetaminophen (NORCO) 10-325 MG tablet Take 1 tablet by mouth every 6 (six) hours as needed for severe pain. 12/03/17 01/02/18 Yes King, Diona Foley, NP  tiZANidine (ZANAFLEX) 4 MG tablet Take 1 tablet (4 mg total) by mouth every 8 (eight) hours as needed for muscle spasms. 11/29/17 12/29/17 Yes King, Diona Foley, NP      VITAL SIGNS:  Blood pressure (!) 145/88, pulse 64, temperature 98 F (36.7 C), temperature source Oral, resp. rate 15, height 6\' 1"  (1.854 m), SpO2 96 %.  PHYSICAL EXAMINATION:  Physical Exam  GENERAL:  52 y.o.-year-old patient lying in the bed with no acute distress.  EYES: Pupils equal, round, reactive to light and accommodation. No scleral icterus. Extraocular muscles intact.  HEENT: Head atraumatic, normocephalic. Oropharynx and nasopharynx clear. No oropharyngeal erythema, moist oral mucosa  NECK:  Supple, no jugular venous distention. No thyroid enlargement, no tenderness.  LUNGS: Normal breath sounds bilaterally, no wheezing, rales, rhonchi. No use of accessory muscles of respiration.  CARDIOVASCULAR: S1, S2 RRR. No murmurs, rubs, gallops, clicks.  ABDOMEN: Soft, nontender, nondistended. Bowel sounds present. No organomegaly or mass.  EXTREMITIES: No pedal edema, cyanosis, or clubbing. + 2 pedal & radial pulses b/l.   NEUROLOGIC: Cranial nerves II through XII are intact. No focal Motor or sensory deficits appreciated b/l PSYCHIATRIC: The patient is alert and oriented x 3.   SKIN: No obvious rash, lesion, or ulcer.   LABORATORY PANEL:   CBC Recent Labs  Lab 12/06/17 1637  WBC 9.2  HGB 14.9  HCT 43.6  PLT 183    ------------------------------------------------------------------------------------------------------------------  Chemistries  Recent Labs  Lab 12/06/17 1637  NA 137  K 4.1  CL 105  CO2 26  GLUCOSE 92  BUN 12  CREATININE 0.92  CALCIUM 9.5  AST 24  ALT 21  ALKPHOS 64  BILITOT 0.6   ------------------------------------------------------------------------------------------------------------------  Cardiac Enzymes Recent Labs  Lab 12/06/17 1637  TROPONINI 0.03*   ------------------------------------------------------------------------------------------------------------------  RADIOLOGY:  Ct Angio Chest/abd/pel For Dissection W And/or Wo Contrast  Result Date: 12/06/2017 CLINICAL DATA:  Intermittent chest pressure and worsening shortness of breath since Saturday. EXAM: CT ANGIOGRAPHY CHEST, ABDOMEN AND PELVIS TECHNIQUE: Multidetector CT imaging through the chest, abdomen and pelvis was performed using the standard protocol during bolus administration of intravenous contrast. Multiplanar reconstructed images and MIPs were obtained and reviewed to evaluate the vascular anatomy. CONTRAST:  133mL OMNIPAQUE IOHEXOL 350 MG/ML SOLN COMPARISON:  CT abdomen pelvis dated April 29, 2017. FINDINGS: CTA CHEST FINDINGS Cardiovascular: Preferential opacification of the thoracic aorta. No evidence of thoracic aortic aneurysm or dissection. Aberrant origin of the right subclavian artery. Coronary, aortic arch, and branch vessel atherosclerotic vascular disease. normal heart size. No pericardial effusion. No central pulmonary embolism. Mediastinum/Nodes: No enlarged mediastinal, hilar, or  axillary lymph nodes. Thyroid gland, trachea, and esophagus demonstrate no significant findings. Lungs/Pleura: No focal consolidation, pleural effusion, or pneumothorax. No suspicious pulmonary nodule. Musculoskeletal: No chest wall abnormality. No acute or significant osseous findings. Review of the MIP images  confirms the above findings. CTA ABDOMEN AND PELVIS FINDINGS VASCULAR Aorta: Normal caliber aorta without aneurysm, dissection, vasculitis or significant stenosis. Mild atherosclerosis. Celiac: Patent without evidence of aneurysm, dissection, vasculitis or significant stenosis. SMA: Patent without evidence of aneurysm, dissection, vasculitis or significant stenosis. Renals: Single right and two left renal arteries are patent without evidence of aneurysm, dissection, vasculitis, fibromuscular dysplasia or significant stenosis. IMA: Patent without evidence of aneurysm, dissection, vasculitis or significant stenosis. Inflow: Mild stenosis of the distal left common iliac artery due to calcified and noncalcified atherosclerotic plaque. Otherwise patent without evidence of aneurysm, dissection, vasculitis or significant stenosis. Veins: No obvious venous abnormality within the limitations of this arterial phase study. Review of the MIP images confirms the above findings. NON-VASCULAR Hepatobiliary: No focal liver abnormality is seen. No gallstones, gallbladder wall thickening, or biliary dilatation. Pancreas: Unremarkable. No pancreatic ductal dilatation or surrounding inflammatory changes. Spleen: Normal in size without focal abnormality. Adrenals/Urinary Tract: Adrenal glands are unremarkable. Kidneys are normal, without renal calculi, focal lesion, or hydronephrosis. Unchanged simple cyst in the right kidney. Bladder is unremarkable. Stomach/Bowel: Stomach is within normal limits. Appendix appears normal. No evidence of bowel wall thickening, distention, or inflammatory changes. Lymphatic: No enlarged abdominal or pelvic lymph nodes. Reproductive: Prostate is unremarkable. Other: No abdominal wall hernia or abnormality. No abdominopelvic ascites. No pneumoperitoneum. Musculoskeletal: No acute or significant osseous findings. 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 Chronic deformity of the left iliac crest. Prior L4-L5 fusion. Interval removal of the spinal cord stimulator in the left posterior abdominal wall. Review of the MIP images confirms the above findings. IMPRESSION: 1. No acute thoracoabdominal aortic pathology.  No aneurysm. 2.  Aortic atherosclerosis (ICD10-I70.0). Electronically Signed   By: Titus Dubin M.D.   On: 12/06/2017 18:07     IMPRESSION AND PLAN:   52 year old male with past medical history of chronic back pain, ongoing tobacco abuse who presents to the hospital due to chest pain, left arm numbness noted to have a mildly elevated troponin.  1.  Chest pain/mildly elevated troponin-patient does have risk factors given his ongoing tobacco abuse and strong family history of heart disease. - His symptoms are although quite atypical.  We will observe him on telemetry, cycle his cardiac markers. - Place him on aspirin and some nitroglycerin as needed, get a nuclear medicine stress test in the morning and also get a cardiology consult.  2.  Chronic back pain-continue his tizanidine and Norco.  3.  Tobacco abuse-continue nicotine patch.    All the records are reviewed and case discussed with ED provider. Management plans discussed with the patient, family and they are in agreement.  CODE STATUS: Full code  TOTAL TIME TAKING CARE OF THIS PATIENT: 40 minutes.    Henreitta Leber M.D on 12/06/2017 at 7:06 PM  Between 7am to 6pm - Pager - [phone]  After 6pm go to [email or web address] - password [email or web address]  [facility]  Office  [phone]  CC: Primary care physician; Steele Sizer, MD

## 2017-12-06 NOTE — ED Provider Notes (Signed)
[facility] Emergency Department Provider Note   ____________________________________________   First MD Initiated Contact with Patient 12/06/17 1642     (approximate)  I have reviewed the triage vital signs and the nursing notes.   HISTORY  Chief Complaint Chest Pain    HPI Wesley Adams is a 52 y.o. male has a history of back pain active smoker reports a strong family history of heart disease  Patient reports that he began experiencing discomfort in his chest on about Saturday while at the beach.  Is a pressure feeling over the left side of the chest with some numbness and tingling radiating to the left upper arm but not into the hand.  He also reports he urinated and noticed that he urinate a large amount of blood and clot that evening as well.  He reports he has not seen any more blood and has not had any pain or discomfort in the abdomen, but has had a persistent pressure feeling over the left side of the chest.  Today it seem to be getting a little bit worse though nothing seemed to be clearly causing it to worsen.  Is associated with a feeling of numbness.  Does not feel like a soreness in the muscles.  He reports he has felt sweaty all day when he is had the discomfort.  Has not taken any medications except for 324 mg aspirin given by EMS.  Currently reports a mild to moderate pressure feeling over the left chest.  No personal history of heart disease reports a strong family history of heart disease and he is a smoker.  Past Medical History:  Diagnosis Date  . Chronic back pain     Patient Active Problem List   Diagnosis Date Noted  . Long term current use of opiate analgesic 11/03/2017  . Chronic bilateral low back pain with bilateral sciatica 10/03/2017  . Chronic pain syndrome 10/03/2017  . Opiate use 10/03/2017  . Epigastric pain   . Special screening for malignant neoplasms, colon   . Chronic, continuous use of opioids 05/27/2017  .  Tobacco use 05/27/2017  . Spinal cord stimulator status 04/29/2017  . Acute low back pain without sciatica 11/04/2015  . Lumbar degenerative disc disease 01/30/2015    Past Surgical History:  Procedure Laterality Date  . BACK SURGERY  Feb 2016   cut disc off of nerve with Dr Arnoldo Morale  . COLONOSCOPY WITH PROPOFOL N/A 06/01/2017   Procedure: COLONOSCOPY WITH PROPOFOL;  Surgeon: Lin Landsman, MD;  Location: [place];  Service: Endoscopy;  Laterality: N/A;  . ESOPHAGOGASTRODUODENOSCOPY (EGD) WITH PROPOFOL  06/01/2017   Procedure: ESOPHAGOGASTRODUODENOSCOPY (EGD) WITH PROPOFOL;  Surgeon: Lin Landsman, MD;  Location: [place];  Service: Endoscopy;;  . FRACTURE SURGERY Right as child   rod in right femur  . POLYPECTOMY  06/01/2017   Procedure: POLYPECTOMY;  Surgeon: Lin Landsman, MD;  Location: [place];  Service: Endoscopy;;  . SPINAL CORD STIMULATOR INSERTION  03/23/2017   Dr Newman Pies, [facility] Neurosurgery  . SPINAL CORD STIMULATOR REMOVAL N/A 06/16/2017   Procedure: SPINAL CORD STIMULATOR REMOVAL;  Surgeon: Newman Pies, MD;  Location: [place];  Service: Neurosurgery;  Laterality: N/A;    Prior to Admission medications   Medication Sig Start Date End Date Taking? Authorizing Provider  HYDROcodone-acetaminophen (NORCO) 10-325 MG tablet Take 1 tablet by mouth every 6 (six) hours as needed for severe pain. 12/03/17 01/02/18 Yes Vevelyn Francois, NP  tiZANidine (ZANAFLEX) 4 MG tablet Take 1 tablet (4 mg total) by mouth every 8 (eight) hours as needed for muscle spasms. 11/29/17 12/29/17 Yes Vevelyn Francois, NP    Allergies Patient has no known allergies.  Family History  Problem Relation Age of Onset  . Alcohol abuse Mother   . Heart attack Father   . Heart disease Father   . Obesity Father     Social History Social History   Tobacco Use  . Smoking status: Current Every Day Smoker    Packs/day: 1.00    Years: 25.00    Pack years:  25.00    Types: Cigarettes  . Smokeless tobacco: Never Used  . Tobacco comment: down to 1/2 PPD  Substance Use Topics  . Alcohol use: Yes    Alcohol/week: 3.0 standard drinks    Types: 3 Cans of beer per week    Comment: beer on weekend  . Drug use: No    Review of Systems Constitutional: No fever/chills Eyes: No visual changes. ENT: No sore throat. Cardiovascular: See HPI  respiratory: Denies shortness of breath now but did experience a feeling of being a little short of breath in the morning while at the beach. Gastrointestinal: No abdominal pain.  No nausea, no vomiting.  No diarrhea.  No constipation. Genitourinary: Negative for dysuria. Musculoskeletal: Negative for back pain.  Reports both of his lower legs seemed a little swollen while at the beach but that is gotten better, thought it might be due to the heat being at the beach. Skin: Negative for rash. Neurological: Negative for headaches, focal weakness or numbness.    ____________________________________________   PHYSICAL EXAM:  VITAL SIGNS: ED Triage Vitals [12/06/17 1635]  Enc Vitals Group     BP (!) 170/98     Pulse Rate (!) 57     Resp 12     Temp 98 F (36.7 C)     Temp Source Oral     SpO2 100 %     Weight      Height 6\' 1"  (1.854 m)     Head Circumference      Peak Flow      Pain Score 4     Pain Loc      Pain Edu?      Excl. in [place]?     Constitutional: Alert and oriented. Well appearing and in no acute distress. Eyes: Conjunctivae are normal. Head: Atraumatic. Nose: No congestion/rhinnorhea. Mouth/Throat: Mucous membranes are moist. Neck: No stridor.   Cardiovascular: Normal rate, regular rhythm. Grossly normal heart sounds.  Good peripheral circulation.  No reproducible tenderness across the chest. Respiratory: Normal respiratory effort.  No retractions. Lungs CTAB. Gastrointestinal: Soft and nontender. No distention. Musculoskeletal: No lower extremity tenderness nor edema. Neurologic:   Normal speech and language. No gross focal neurologic deficits are appreciated.  No facial droop.  No trouble speaking.  Clear sentences.  Denies loss of sensation over the face bilateral.  Reports a slight decrease in sensation from about the left shoulder down the left elbow to palp patient in light touch, normal over the right.  No numbness over the left forearm or left hand however.  Normal motor use of median ulnar and radial distribution with strong intact radial pulse and cap refill.  Denies any numbness or weakness in the lower legs and moves both lower extremities with 5 out of 5 strength.  He does report chronic low back pain, but no change with that. Skin:  Skin  is warm, dry and intact. No rash noted. Psychiatric: Mood and affect are normal. Speech and behavior are normal.  ____________________________________________   LABS (all labs ordered are listed, but only abnormal results are displayed)  Labs Reviewed  URINALYSIS, COMPLETE (UACMP) WITH MICROSCOPIC - Abnormal; Notable for the following components:      Result Value   Color, Urine STRAW (*)    APPearance CLEAR (*)    Hgb urine dipstick SMALL (*)    All other components within normal limits  TROPONIN I - Abnormal; Notable for the following components:   Troponin I 0.03 (*)    All other components within normal limits  CBC  COMPREHENSIVE METABOLIC PANEL  LIPASE, BLOOD  PROTIME-INR  APTT  BRAIN NATRIURETIC PEPTIDE   ____________________________________________  EKG  Reviewed enterotomy at 1635 Heart rate 60 QRS 95 QTc 420 Normal sinus rhythm, some slight Q waves are noted in V2, consider possible old anteroseptal infarct.  Minimal J-point elevation in V3.  No STEMI. No old for comparison, slight T wave abnormality, probably J point in V3 . ____________________________________________  RADIOLOGY  Ct Angio Chest/abd/pel For Dissection W And/or Wo Contrast  Result Date: 12/06/2017 CLINICAL DATA:  Intermittent  chest pressure and worsening shortness of breath since Saturday. EXAM: CT ANGIOGRAPHY CHEST, ABDOMEN AND PELVIS TECHNIQUE: Multidetector CT imaging through the chest, abdomen and pelvis was performed using the standard protocol during bolus administration of intravenous contrast. Multiplanar reconstructed images and MIPs were obtained and reviewed to evaluate the vascular anatomy. CONTRAST:  165mL OMNIPAQUE IOHEXOL 350 MG/ML SOLN COMPARISON:  CT abdomen pelvis dated April 29, 2017. FINDINGS: CTA CHEST FINDINGS Cardiovascular: Preferential opacification of the thoracic aorta. No evidence of thoracic aortic aneurysm or dissection. Aberrant origin of the right subclavian artery. Coronary, aortic arch, and branch vessel atherosclerotic vascular disease. normal heart size. No pericardial effusion. No central pulmonary embolism. Mediastinum/Nodes: No enlarged mediastinal, hilar, or axillary lymph nodes. Thyroid gland, trachea, and esophagus demonstrate no significant findings. Lungs/Pleura: No focal consolidation, pleural effusion, or pneumothorax. No suspicious pulmonary nodule. Musculoskeletal: No chest wall abnormality. No acute or significant osseous findings. Review of the MIP images confirms the above findings. CTA ABDOMEN AND PELVIS FINDINGS VASCULAR Aorta: Normal caliber aorta without aneurysm, dissection, vasculitis or significant stenosis. Mild atherosclerosis. Celiac: Patent without evidence of aneurysm, dissection, vasculitis or significant stenosis. SMA: Patent without evidence of aneurysm, dissection, vasculitis or significant stenosis. Renals: Single right and two left renal arteries are patent without evidence of aneurysm, dissection, vasculitis, fibromuscular dysplasia or significant stenosis. IMA: Patent without evidence of aneurysm, dissection, vasculitis or significant stenosis. Inflow: Mild stenosis of the distal left common iliac artery due to calcified and noncalcified atherosclerotic plaque.  Otherwise patent without evidence of aneurysm, dissection, vasculitis or significant stenosis. Veins: No obvious venous abnormality within the limitations of this arterial phase study. Review of the MIP images confirms the above findings. NON-VASCULAR Hepatobiliary: No focal liver abnormality is seen. No gallstones, gallbladder wall thickening, or biliary dilatation. Pancreas: Unremarkable. No pancreatic ductal dilatation or surrounding inflammatory changes. Spleen: Normal in size without focal abnormality. Adrenals/Urinary Tract: Adrenal glands are unremarkable. Kidneys are normal, without renal calculi, focal lesion, or hydronephrosis. Unchanged simple cyst in the right kidney. Bladder is unremarkable. Stomach/Bowel: Stomach is within normal limits. Appendix appears normal. No evidence of bowel wall thickening, distention, or inflammatory changes. Lymphatic: No enlarged abdominal or pelvic lymph nodes. Reproductive: Prostate is unremarkable. Other: No abdominal wall hernia or abnormality. No abdominopelvic ascites. No pneumoperitoneum. Musculoskeletal: No acute  or significant osseous findings. 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 Chronic deformity of the left iliac crest. Prior L4-L5 fusion. Interval removal of the spinal cord stimulator in the left posterior abdominal wall. Review of the MIP images confirms the above findings. IMPRESSION: 1. No acute thoracoabdominal aortic pathology.  No aneurysm. 2.  Aortic atherosclerosis (ICD10-I70.0). Electronically Signed   By: Titus Dubin M.D.   On: 12/06/2017 18:07    ____________________________________________   PROCEDURES  Procedure(s) performed: None  Procedures  Critical Care performed: No  ____________________________________________   INITIAL IMPRESSION / ASSESSMENT AND PLAN / ED COURSE  Pertinent labs & imaging results that were available during my care of the  patient were reviewed by me and considered in my medical decision making (see chart for details).  Differential diagnosis includes, but is not limited to, ACS, aortic dissection, pulmonary embolism, cardiac tamponade, pneumothorax, pneumonia, pericarditis, myocarditis, GI-related causes including esophagitis/gastritis, and musculoskeletal chest wall pain.  Somewhat hard to associate how the potential hematuria is associated with the chest discomfort, but supposed to could be referred discomfort into the chest from something such as a kidney stone or renal process given his complaint of hematuria.  Urinalysis.  Additionally though it seems highly unlikely, aortic dissection could also cause chest discomfort, paresthesia in the left upper extremity and potentially some abdominal symptoms or hematuria.  Given his complaint of hematuria arising with association of the left-sided chest pressure we will proceed with CT scans to further evaluate for thoracic or aortic/renal pathology.  Additionally send urinalysis, check troponin.  Nitrates for chest discomfort at this time.  Patient reporting mild discomfort only at this time having only received aspirin.     ----------------------------------------- 6:17 PM on 12/06/2017 -----------------------------------------  Chest pain is improved after 3 doses of nitro and Nitropaste.  Resting comfortably at this time.  Will admit for further evaluation given his minimally elevated troponin and very nonspecific T wave abnormality.  Patient comfortable with plan for admission, discussed with Dr. Verdell Carmine.  ____________________________________________   FINAL CLINICAL IMPRESSION(S) / ED DIAGNOSES  Final diagnoses:  Moderate risk chest pain  Hematuria, unspecified type      NEW MEDICATIONS STARTED DURING THIS VISIT:  New Prescriptions   No medications on file     Note:  This document was prepared using Dragon voice recognition software and may include  unintentional dictation errors.     Delman Kitten, MD 12/06/17 [phone]

## 2017-12-06 NOTE — ED Notes (Signed)
MD at bedside. 

## 2017-12-06 NOTE — ED Notes (Signed)
Patient transported to CT 

## 2017-12-06 NOTE — ED Triage Notes (Addendum)
Pt to ED via EMS from clinic reporting on and off left sided chest pressure since Saturday with worsening SOB. Clear lung sounds. Pt also reporting diaphoresis today that I significantly more than normal.    Left arm has gone completly numb twice since Saturday.   324 ASA PO given with EMS  Saturday hematuria and flank pain that has since subsided.

## 2017-12-07 ENCOUNTER — Observation Stay: Payer: BLUE CROSS/BLUE SHIELD

## 2017-12-07 ENCOUNTER — Encounter: Payer: Self-pay | Admitting: Radiology

## 2017-12-07 LAB — TROPONIN I
TROPONIN I: 0.03 ng/mL — AB (ref <0.03)
TROPONIN I: 0.03 ng/mL — AB (ref <0.03)

## 2017-12-07 MED ORDER — SODIUM CHLORIDE 0.9% FLUSH
3.0000 mL | Freq: Two times a day (BID) | INTRAVENOUS | Status: DC
  Administered 2017-12-07: 3 mL via INTRAVENOUS

## 2017-12-07 MED ORDER — ASPIRIN 81 MG PO CHEW
81.0000 mg | CHEWABLE_TABLET | ORAL | Status: AC
  Administered 2017-12-08: 81 mg via ORAL
  Filled 2017-12-07: qty 1

## 2017-12-07 MED ORDER — TECHNETIUM TC 99M TETROFOSMIN IV KIT
30.0000 | PACK | Freq: Once | INTRAVENOUS | Status: AC | PRN
  Administered 2017-12-07: 29.49 via INTRAVENOUS

## 2017-12-07 MED ORDER — KETOROLAC TROMETHAMINE 30 MG/ML IJ SOLN: 30.0000 mg | Freq: Four times a day (QID) | INTRAMUSCULAR | Status: DC | PRN

## 2017-12-07 MED ORDER — TECHNETIUM TC 99M TETROFOSMIN IV KIT
14.0970 | PACK | Freq: Once | INTRAVENOUS | Status: AC | PRN
  Administered 2017-12-07: 14.097 via INTRAVENOUS

## 2017-12-07 MED ORDER — SODIUM CHLORIDE 0.9 % IV SOLN: 250.0000 mL | INTRAVENOUS | Status: DC | PRN

## 2017-12-07 MED ORDER — SODIUM CHLORIDE 0.9 % WEIGHT BASED INFUSION
3.0000 mL/kg/h | INTRAVENOUS | Status: AC
  Administered 2017-12-08: 3 mL/kg/h via INTRAVENOUS

## 2017-12-07 MED ORDER — SODIUM CHLORIDE 0.9 % WEIGHT BASED INFUSION: 1.0000 mL/kg/h | INTRAVENOUS | Status: DC

## 2017-12-07 MED ORDER — SODIUM CHLORIDE 0.9% FLUSH
3.0000 mL | INTRAVENOUS | Status: DC | PRN
  Administered 2017-12-07: 3 mL via INTRAVENOUS
  Filled 2017-12-07: qty 3

## 2017-12-07 MED ORDER — REGADENOSON 0.4 MG/5ML IV SOLN
0.4000 mg | Freq: Once | INTRAVENOUS | Status: AC
  Administered 2017-12-07: 0.4 mg via INTRAVENOUS
  Filled 2017-12-07: qty 5

## 2017-12-07 NOTE — Progress Notes (Signed)
[place] at [facility]                                                                                                                                                                                  Patient Demographics   Wesley Adams, is a 52 y.o. male, DOB - 1965-09-27, SAY:[id number]  Admit date - 12/06/2017   Admitting Physician Henreitta Leber, MD  Outpatient Primary MD for the patient is Steele Sizer, MD   LOS - 0  Subjective: Patient admitted with chest pain underwent stress test.  He still states that he is having sharp chest pain.    Review of Systems:   CONSTITUTIONAL: No documented fever. No fatigue, weakness. No weight gain, no weight loss.  EYES: No blurry or double vision.  ENT: No tinnitus. No postnasal drip. No redness of the oropharynx.  RESPIRATORY: No cough, no wheeze, no hemoptysis. No dyspnea.  CARDIOVASCULAR: Positive chest pain. No orthopnea. No palpitations. No syncope.  GASTROINTESTINAL: No nausea, no vomiting or diarrhea. No abdominal pain. No melena or hematochezia.  GENITOURINARY: No dysuria or hematuria.  ENDOCRINE: No polyuria or nocturia. No heat or cold intolerance.  HEMATOLOGY: No anemia. No bruising. No bleeding.  INTEGUMENTARY: No rashes. No lesions.  MUSCULOSKELETAL: No arthritis. No swelling. No gout.  NEUROLOGIC: No numbness, tingling, or ataxia. No seizure-type activity.  PSYCHIATRIC: No anxiety. No insomnia. No ADD.    Vitals:   Vitals:   12/06/17 1705 12/06/17 1900 12/06/17 2028 12/07/17 0500  BP: (!) 145/88 139/87 (!) 146/73 137/90  Pulse: 64 (!) 51 63 (!) 57  Resp: 15 11 17 17   Temp:   97.7 F (36.5 C) 97.6 F (36.4 C)  TempSrc:   Oral Oral  SpO2: 96% 95% 99% 100%  Weight:   95.3 kg   Height:   6\' 1"  (1.854 m)     Wt Readings from Last 3 Encounters:  12/06/17 95.3 kg  12/06/17 97.3 kg  11/29/17 95.3 kg     Intake/Output Summary (Last 24 hours) at 12/07/2017 1544 Last data filed at  12/07/2017 1430 Gross per 24 hour  Intake -  Output 200 ml  Net -200 ml    Physical Exam:   GENERAL: Pleasant-appearing in no apparent distress.  HEAD, EYES, EARS, NOSE AND THROAT: Atraumatic, normocephalic. Extraocular muscles are intact. Pupils equal and reactive to light. Sclerae anicteric. No conjunctival injection. No oro-pharyngeal erythema.  NECK: Supple. There is no jugular venous distention. No bruits, no lymphadenopathy, no thyromegaly.  HEART: Regular rate and rhythm,. No murmurs, no rubs, no clicks.  LUNGS: Clear to auscultation bilaterally. No rales or rhonchi. No wheezes.  ABDOMEN: Soft, flat,  nontender, nondistended. Has good bowel sounds. No hepatosplenomegaly appreciated.  EXTREMITIES: No evidence of any cyanosis, clubbing, or peripheral edema.  +2 pedal and radial pulses bilaterally.  Positive reproducible chest pain NEUROLOGIC: The patient is alert, awake, and oriented x3 with no focal motor or sensory deficits appreciated bilaterally.  SKIN: Moist and warm with no rashes appreciated.  Psych: Not anxious, depressed LN: No inguinal LN enlargement    Antibiotics   Anti-infectives (From admission, onward)   None      Medications   Scheduled Meds: . aspirin EC  81 mg Oral Daily  . enoxaparin (LOVENOX) injection  40 mg Subcutaneous Q24H  . nicotine  21 mg Transdermal Daily   Continuous Infusions: PRN Meds:.acetaminophen **OR** acetaminophen, HYDROcodone-acetaminophen, ketorolac, nitroGLYCERIN, ondansetron **OR** ondansetron (ZOFRAN) IV, tiZANidine   Data Review:   Micro Results No results found for this or any previous visit (from the past 240 hour(s)).  Radiology Reports Ct Angio Chest/abd/pel For Dissection W And/or Wo Contrast  Result Date: 12/06/2017 CLINICAL DATA:  Intermittent chest pressure and worsening shortness of breath since Saturday. EXAM: CT ANGIOGRAPHY CHEST, ABDOMEN AND PELVIS TECHNIQUE: Multidetector CT imaging through the chest, abdomen  and pelvis was performed using the standard protocol during bolus administration of intravenous contrast. Multiplanar reconstructed images and MIPs were obtained and reviewed to evaluate the vascular anatomy. CONTRAST:  160mL OMNIPAQUE IOHEXOL 350 MG/ML SOLN COMPARISON:  CT abdomen pelvis dated April 29, 2017. FINDINGS: CTA CHEST FINDINGS Cardiovascular: Preferential opacification of the thoracic aorta. No evidence of thoracic aortic aneurysm or dissection. Aberrant origin of the right subclavian artery. Coronary, aortic arch, and branch vessel atherosclerotic vascular disease. normal heart size. No pericardial effusion. No central pulmonary embolism. Mediastinum/Nodes: No enlarged mediastinal, hilar, or axillary lymph nodes. Thyroid gland, trachea, and esophagus demonstrate no significant findings. Lungs/Pleura: No focal consolidation, pleural effusion, or pneumothorax. No suspicious pulmonary nodule. Musculoskeletal: No chest wall abnormality. No acute or significant osseous findings. Review of the MIP images confirms the above findings. CTA ABDOMEN AND PELVIS FINDINGS VASCULAR Aorta: Normal caliber aorta without aneurysm, dissection, vasculitis or significant stenosis. Mild atherosclerosis. Celiac: Patent without evidence of aneurysm, dissection, vasculitis or significant stenosis. SMA: Patent without evidence of aneurysm, dissection, vasculitis or significant stenosis. Renals: Single right and two left renal arteries are patent without evidence of aneurysm, dissection, vasculitis, fibromuscular dysplasia or significant stenosis. IMA: Patent without evidence of aneurysm, dissection, vasculitis or significant stenosis. Inflow: Mild stenosis of the distal left common iliac artery due to calcified and noncalcified atherosclerotic plaque. Otherwise patent without evidence of aneurysm, dissection, vasculitis or significant stenosis. Veins: No obvious venous abnormality within the limitations of this arterial phase  study. Review of the MIP images confirms the above findings. NON-VASCULAR Hepatobiliary: No focal liver abnormality is seen. No gallstones, gallbladder wall thickening, or biliary dilatation. Pancreas: Unremarkable. No pancreatic ductal dilatation or surrounding inflammatory changes. Spleen: Normal in size without focal abnormality. Adrenals/Urinary Tract: Adrenal glands are unremarkable. Kidneys are normal, without renal calculi, focal lesion, or hydronephrosis. Unchanged simple cyst in the right kidney. Bladder is unremarkable. Stomach/Bowel: Stomach is within normal limits. Appendix appears normal. No evidence of bowel wall thickening, distention, or inflammatory changes. Lymphatic: No enlarged abdominal or pelvic lymph nodes. Reproductive: Prostate is unremarkable. Other: No abdominal wall hernia or abnormality. No abdominopelvic ascites. No pneumoperitoneum. Musculoskeletal: No acute or significant osseous findings. 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 Chronic deformity of the left iliac crest. Prior L4-L5 fusion.  Interval removal of the spinal cord stimulator in the left posterior abdominal wall. Review of the MIP images confirms the above findings. IMPRESSION: 1. No acute thoracoabdominal aortic pathology.  No aneurysm. 2.  Aortic atherosclerosis (ICD10-I70.0). Electronically Signed   By: Titus Dubin M.D.   On: 12/06/2017 18:07     CBC Recent Labs  Lab 12/06/17 1637 12/06/17 2033  WBC 9.2 7.3  HGB 14.9 14.3  HCT 43.6 42.0  PLT 183 175  MCV 91.3 90.7  MCH 31.2 30.8  MCHC 34.2 34.0  RDW 14.2 14.2    Chemistries  Recent Labs  Lab 12/06/17 1637 12/06/17 2033  NA 137  --   K 4.1  --   CL 105  --   CO2 26  --   GLUCOSE 92  --   BUN 12  --   CREATININE 0.92 0.98  CALCIUM 9.5  --   AST 24  --   ALT 21  --   ALKPHOS 64  --   BILITOT 0.6  --     ------------------------------------------------------------------------------------------------------------------ estimated creatinine clearance is 99.6 mL/min (by C-G formula based on SCr of 0.98 mg/dL). ------------------------------------------------------------------------------------------------------------------ No results for input(s): HGBA1C in the last 72 hours. ------------------------------------------------------------------------------------------------------------------ No results for input(s): CHOL, HDL, LDLCALC, TRIG, CHOLHDL, LDLDIRECT in the last 72 hours. ------------------------------------------------------------------------------------------------------------------ No results for input(s): TSH, T4TOTAL, T3FREE, THYROIDAB in the last 72 hours.  Invalid input(s): FREET3 ------------------------------------------------------------------------------------------------------------------ No results for input(s): VITAMINB12, FOLATE, FERRITIN, TIBC, IRON, RETICCTPCT in the last 72 hours.  Coagulation profile Recent Labs  Lab 12/06/17 1637  INR 0.95    No results for input(s): DDIMER in the last 72 hours.  Cardiac Enzymes Recent Labs  Lab 12/06/17 2033 12/06/17 2347 12/07/17 0452  TROPONINI 0.03* 0.03* 0.03*   ------------------------------------------------------------------------------------------------------------------ Invalid input(s): POCBNP    Assessment & Plan   52 year old male with past medical history of chronic back pain, ongoing tobacco abuse who presents to the hospital due to chest pain, left arm numbness noted to have a mildly elevated troponin.  1.  Chest pain Stress test results pending Patient's pain is reproducible we will try some Toradol When I went to see him he states that his father had similar type of presentation and is concerned.  I explained to him that troponin has been negative.  He insists that he needs to be evaluated  further.  I discussed with Dr. Nehemiah Massed who saw the patient plan is for him to get cath.  2.  Chronic back pain-continue his tizanidine and Norco.  3.  Tobacco abuse-continue nicotine patch.     Code Status Orders  (From admission, onward)         Start     Ordered   12/06/17 1952  Full code  Continuous     12/06/17 1951        Code Status History    Date Active Date Inactive Code Status Order ID Comments User Context   01/30/2015 1724 01/31/2015 1518 Full Code [id number]  Newman Pies, MD Inpatient           Consults cardiology   DVT Prophylaxis  Lovenox  Lab Results  Component Value Date   PLT 175 12/06/2017     Time Spent in minutes   35 minutes  Greater than 50% of time spent in care coordination and counseling patient regarding the condition and plan of care.   Dustin Flock M.D on 12/07/2017 at 3:44 PM  Between 7am to 6pm - Pager - [phone]  After 6pm go to [email or web address] - Proofreader  [employer]   Office  [phone]

## 2017-12-07 NOTE — Consult Note (Signed)
[facility] Cardiology Consultation Note  Patient ID: Wesley Adams, MRN: [id number], DOB/AGE: 1966-03-08 52 y.o. Admit date: 12/06/2017   Date of Consult: 12/07/2017 Primary Physician: Steele Sizer, MD Primary Cardiologist: None  Chief Complaint:  Chief Complaint  Patient presents with  . Chest Pain   Reason for Consult: Chest pain  HPI: 52 y.o. male with borderline hypertension hyperlipidemia and strong family history of cardiovascular disease for which the patient has been a significant smoker over the last several years.  Patient has had some recent onset of severe shortness of breath with physical activity as well as some shortness of breath at rest.  This is waxing and waning significantly over the last several days and now associated with left-sided chest discomfort radiating into the left arm and left back consistent with possible unstable angina.  The patient did have an EKG showing normal sinus rhythm with no changes and had normal troponin.  He underwent a Lexiscan infusion Myoview with no evidence of myocardial ischemia.  The patient since then has still had some significant chest discomfort with shortness of breath concerning for continued unstable angina not relieved by current medical regimen.  He is significantly concerned that there is missed coronary artery disease.  He does have a CAT scan that does suggest coronary calcifications suggesting further progression.  Past Medical History:  Diagnosis Date  . Chronic back pain       Surgical History:  Past Surgical History:  Procedure Laterality Date  . BACK SURGERY  Feb 2016   cut disc off of nerve with Dr Arnoldo Morale  . COLONOSCOPY WITH PROPOFOL N/A 06/01/2017   Procedure: COLONOSCOPY WITH PROPOFOL;  Surgeon: Lin Landsman, MD;  Location: [place];  Service: Endoscopy;  Laterality: N/A;  . ESOPHAGOGASTRODUODENOSCOPY (EGD) WITH PROPOFOL  06/01/2017   Procedure: ESOPHAGOGASTRODUODENOSCOPY (EGD) WITH  PROPOFOL;  Surgeon: Lin Landsman, MD;  Location: [place];  Service: Endoscopy;;  . FRACTURE SURGERY Right as child   rod in right femur  . POLYPECTOMY  06/01/2017   Procedure: POLYPECTOMY;  Surgeon: Lin Landsman, MD;  Location: [place];  Service: Endoscopy;;  . SPINAL CORD STIMULATOR INSERTION  03/23/2017   Dr Newman Pies, [facility] Neurosurgery  . SPINAL CORD STIMULATOR REMOVAL N/A 06/16/2017   Procedure: SPINAL CORD STIMULATOR REMOVAL;  Surgeon: Newman Pies, MD;  Location: [place];  Service: Neurosurgery;  Laterality: N/A;     Home Meds: Prior to Admission medications   Medication Sig Start Date End Date Taking? Authorizing Provider  HYDROcodone-acetaminophen (NORCO) 10-325 MG tablet Take 1 tablet by mouth every 6 (six) hours as needed for severe pain. 12/03/17 01/02/18 Yes King, Diona Foley, NP  tiZANidine (ZANAFLEX) 4 MG tablet Take 1 tablet (4 mg total) by mouth every 8 (eight) hours as needed for muscle spasms. 11/29/17 12/29/17 Yes Vevelyn Francois, NP    Inpatient Medications:  . aspirin EC  81 mg Oral Daily  . enoxaparin (LOVENOX) injection  40 mg Subcutaneous Q24H  . nicotine  21 mg Transdermal Daily     Allergies: No Known Allergies  Social History   Socioeconomic History  . Marital status: Single    Spouse name: Not on file  . Number of children: Not on file  . Years of education: Not on file  . Highest education level: Not on file  Occupational History  . Not on file  Social Needs  . Financial resource strain: Not on file  . Food insecurity:  Worry: Not on file    Inability: Not on file  . Transportation needs:    Medical: Not on file    Non-medical: Not on file  Tobacco Use  . Smoking status: Current Every Day Smoker    Packs/day: 1.00    Years: 30.00    Pack years: 30.00    Types: Cigarettes  . Smokeless tobacco: Never Used  . Tobacco comment: down to 1/2 PPD  Substance and Sexual Activity  . Alcohol use: Yes     Alcohol/week: 3.0 standard drinks    Types: 3 Cans of beer per week    Comment: beer on weekend  . Drug use: No  . Sexual activity: Not on file  Lifestyle  . Physical activity:    Days per week: Not on file    Minutes per session: Not on file  . Stress: Not on file  Relationships  . Social connections:    Talks on phone: Not on file    Gets together: Not on file    Attends religious service: Not on file    Active member of club or organization: Not on file    Attends meetings of clubs or organizations: Not on file    Relationship status: Not on file  . Intimate partner violence:    Fear of current or ex partner: Not on file    Emotionally abused: Not on file    Physically abused: Not on file    Forced sexual activity: Not on file  Other Topics Concern  . Not on file  Social History Narrative  . Not on file     Family History  Problem Relation Age of Onset  . Alcohol abuse Mother   . Heart attack Father   . Heart disease Father   . Obesity Father   . Heart attack Paternal Grandfather      Review of Systems Positive for chest pain Negative for: General:  chills, fever, night sweats or weight changes.  Cardiovascular: PND orthopnea syncope dizziness  Dermatological skin lesions rashes Respiratory: Cough congestion Urologic: Frequent urination urination at night and hematuria Abdominal: negative for nausea, vomiting, diarrhea, bright red blood per rectum, melena, or hematemesis Neurologic: negative for visual changes, and/or hearing changes  All other systems reviewed and are otherwise negative except as noted above.  Labs: Recent Labs    12/06/17 1637 12/06/17 2033 12/06/17 2347 12/07/17 0452  TROPONINI 0.03* 0.03* 0.03* 0.03*   Lab Results  Component Value Date   WBC 7.3 12/06/2017   HGB 14.3 12/06/2017   HCT 42.0 12/06/2017   MCV 90.7 12/06/2017   PLT 175 12/06/2017    Recent Labs  Lab 12/06/17 1637 12/06/17 2033  NA 137  --   K 4.1  --   CL 105   --   CO2 26  --   BUN 12  --   CREATININE 0.92 0.98  CALCIUM 9.5  --   PROT 7.6  --   BILITOT 0.6  --   ALKPHOS 64  --   ALT 21  --   AST 24  --   GLUCOSE 92  --    No results found for: CHOL, HDL, LDLCALC, TRIG No results found for: DDIMER  Radiology/Studies:  Ct Angio Chest/abd/pel For Dissection W And/or Wo Contrast  Result Date: 12/06/2017 CLINICAL DATA:  Intermittent chest pressure and worsening shortness of breath since Saturday. EXAM: CT ANGIOGRAPHY CHEST, ABDOMEN AND PELVIS TECHNIQUE: Multidetector CT imaging through the chest, abdomen and pelvis  was performed using the standard protocol during bolus administration of intravenous contrast. Multiplanar reconstructed images and MIPs were obtained and reviewed to evaluate the vascular anatomy. CONTRAST:  16mL OMNIPAQUE IOHEXOL 350 MG/ML SOLN COMPARISON:  CT abdomen pelvis dated April 29, 2017. FINDINGS: CTA CHEST FINDINGS Cardiovascular: Preferential opacification of the thoracic aorta. No evidence of thoracic aortic aneurysm or dissection. Aberrant origin of the right subclavian artery. Coronary, aortic arch, and branch vessel atherosclerotic vascular disease. normal heart size. No pericardial effusion. No central pulmonary embolism. Mediastinum/Nodes: No enlarged mediastinal, hilar, or axillary lymph nodes. Thyroid gland, trachea, and esophagus demonstrate no significant findings. Lungs/Pleura: No focal consolidation, pleural effusion, or pneumothorax. No suspicious pulmonary nodule. Musculoskeletal: No chest wall abnormality. No acute or significant osseous findings. Review of the MIP images confirms the above findings. CTA ABDOMEN AND PELVIS FINDINGS VASCULAR Aorta: Normal caliber aorta without aneurysm, dissection, vasculitis or significant stenosis. Mild atherosclerosis. Celiac: Patent without evidence of aneurysm, dissection, vasculitis or significant stenosis. SMA: Patent without evidence of aneurysm, dissection, vasculitis or  significant stenosis. Renals: Single right and two left renal arteries are patent without evidence of aneurysm, dissection, vasculitis, fibromuscular dysplasia or significant stenosis. IMA: Patent without evidence of aneurysm, dissection, vasculitis or significant stenosis. Inflow: Mild stenosis of the distal left common iliac artery due to calcified and noncalcified atherosclerotic plaque. Otherwise patent without evidence of aneurysm, dissection, vasculitis or significant stenosis. Veins: No obvious venous abnormality within the limitations of this arterial phase study. Review of the MIP images confirms the above findings. NON-VASCULAR Hepatobiliary: No focal liver abnormality is seen. No gallstones, gallbladder wall thickening, or biliary dilatation. Pancreas: Unremarkable. No pancreatic ductal dilatation or surrounding inflammatory changes. Spleen: Normal in size without focal abnormality. Adrenals/Urinary Tract: Adrenal glands are unremarkable. Kidneys are normal, without renal calculi, focal lesion, or hydronephrosis. Unchanged simple cyst in the right kidney. Bladder is unremarkable. Stomach/Bowel: Stomach is within normal limits. Appendix appears normal. No evidence of bowel wall thickening, distention, or inflammatory changes. Lymphatic: No enlarged abdominal or pelvic lymph nodes. Reproductive: Prostate is unremarkable. Other: No abdominal wall hernia or abnormality. No abdominopelvic ascites. No pneumoperitoneum. Musculoskeletal: No acute or significant osseous findings. 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 Chronic deformity of the left iliac crest. Prior L4-L5 fusion. Interval removal of the spinal cord stimulator in the left posterior abdominal wall. Review of the MIP images confirms the above findings. IMPRESSION: 1. No acute thoracoabdominal aortic pathology.  No aneurysm. 2.  Aortic atherosclerosis (ICD10-I70.0).  Electronically Signed   By: Titus Dubin M.D.   On: 12/06/2017 18:07    EKG: Normal sinus rhythm  Weights: Filed Weights   12/06/17 2028  Weight: 95.3 kg     Physical Exam: Blood pressure 137/90, pulse (!) 57, temperature 97.6 F (36.4 C), temperature source Oral, resp. rate 17, height 6\' 1"  (1.854 m), weight 95.3 kg, SpO2 100 %. Body mass index is 27.71 kg/m. General: Well developed, well nourished, in no acute distress. Head eyes ears nose throat: Normocephalic, atraumatic, sclera non-icteric, no xanthomas, nares are without discharge. No apparent thyromegaly and/or mass  Lungs: Normal respiratory effort.  no wheezes, no rales, no rhonchi.  Heart: RRR with normal S1 S2. no murmur gallop, no rub, PMI is normal size and placement, carotid upstroke normal without bruit, jugular venous pressure is normal Abdomen: Soft, non-tender, non-distended with normoactive bowel sounds. No hepatomegaly. No rebound/guarding. No obvious abdominal masses. Abdominal aorta is normal size without bruit Extremities:  No edema. no cyanosis, no clubbing, no ulcers  Peripheral : 2+ bilateral upper extremity pulses, 2+ bilateral femoral pulses, 2+ bilateral dorsal pedal pulse Neuro: Alert and oriented. No facial asymmetry. No focal deficit. Moves all extremities spontaneously. Musculoskeletal: Normal muscle tone without kyphosis Psych:  Responds to questions appropriately with a normal affect.    Assessment: 52 year old male with family history of cardiovascular disease essential hypertension mixed hyperlipidemia and tobacco abuse with unstable angina and no current evidence of myocardial infarction  Plan: 1.  Proceed to cardiac catheterization to assess coronary anatomy and further treatment thereof is necessary.  Patient understands risk and benefits of cardiac catheterization.  This includes a possibility of death stroke heart attack infection bleeding or blood clot.  He is at low risk for conscious  sedation 2.  Continue nitrates for chest pain 3.  Discontinuation of tobacco abuse 4.  Further evaluation and treatment options after above  Signed, Corey Skains M.D. [facility] 12/07/2017, 1:42 PM

## 2017-12-07 NOTE — Plan of Care (Signed)

## 2017-12-08 ENCOUNTER — Encounter
Admission: EM | Disposition: A | Discharge status: Home / Self Care | Payer: Self-pay | Source: Home / Self Care | Attending: Emergency Medicine

## 2017-12-08 HISTORY — PX: LEFT HEART CATH AND CORONARY ANGIOGRAPHY: CATH118249

## 2017-12-08 LAB — NM MYOCAR MULTI W/SPECT W/WALL MOTION / EF
CHL CUP MPHR: 168 {beats}/min
CHL CUP NUCLEAR SDS: 0
CHL CUP RESTING HR STRESS: 57 {beats}/min
CSEPED: 1 min
CSEPEW: 1 METS
CSEPHR: 65 %
Exercise duration (sec): 0 s
LV dias vol: 129 mL (ref 62–150)
LV sys vol: 74 mL
Peak HR: 110 {beats}/min
SRS: 1
SSS: 0
TID: 1.22

## 2017-12-08 LAB — HIV ANTIBODY (ROUTINE TESTING W REFLEX): HIV Screen 4th Generation wRfx: NONREACTIVE

## 2017-12-08 SURGERY — LEFT HEART CATH AND CORONARY ANGIOGRAPHY
Anesthesia: Moderate Sedation

## 2017-12-08 MED ORDER — SODIUM CHLORIDE 0.9 % WEIGHT BASED INFUSION: 1.0000 mL/kg/h | INTRAVENOUS | Status: DC

## 2017-12-08 MED ORDER — FENTANYL CITRATE (PF) 100 MCG/2ML IJ SOLN
INTRAMUSCULAR | Status: AC
  Filled 2017-12-08: qty 2

## 2017-12-08 MED ORDER — HEPARIN (PORCINE) IN NACL 1000-0.9 UT/500ML-% IV SOLN
INTRAVENOUS | Status: AC
Start: 2017-12-08 — End: ?
  Filled 2017-12-08: qty 1000

## 2017-12-08 MED ORDER — ASPIRIN 81 MG PO TBEC: 81.0000 mg | DELAYED_RELEASE_TABLET | Freq: Every day | ORAL | Status: DC

## 2017-12-08 MED ORDER — IOPAMIDOL (ISOVUE-300) INJECTION 61%
INTRAVENOUS | Status: DC | PRN
  Administered 2017-12-08: 110 mL via INTRA_ARTERIAL

## 2017-12-08 MED ORDER — FENTANYL CITRATE (PF) 100 MCG/2ML IJ SOLN
INTRAMUSCULAR | Status: DC | PRN
  Administered 2017-12-08: 50 ug via INTRAVENOUS

## 2017-12-08 MED ORDER — HEPARIN (PORCINE) IN NACL 1000-0.9 UT/500ML-% IV SOLN
INTRAVENOUS | Status: DC | PRN
  Administered 2017-12-08 (×2): 500 mL

## 2017-12-08 MED ORDER — ONDANSETRON HCL 4 MG/2ML IJ SOLN: 4.0000 mg | Freq: Four times a day (QID) | INTRAMUSCULAR | Status: DC | PRN

## 2017-12-08 MED ORDER — HYDROCODONE-ACETAMINOPHEN 10-325 MG PO TABS: 1.0000 | ORAL_TABLET | Freq: Four times a day (QID) | ORAL | 0 refills | Status: DC | PRN

## 2017-12-08 MED ORDER — ACETAMINOPHEN 325 MG PO TABS: 650.0000 mg | ORAL_TABLET | Freq: Four times a day (QID) | ORAL | Status: DC | PRN

## 2017-12-08 MED ORDER — PANTOPRAZOLE SODIUM 40 MG PO TBEC: 40.0000 mg | DELAYED_RELEASE_TABLET | Freq: Every day | ORAL | 11 refills | Status: DC

## 2017-12-08 MED ORDER — ACETAMINOPHEN 325 MG PO TABS: 650.0000 mg | ORAL_TABLET | ORAL | Status: DC | PRN

## 2017-12-08 MED ORDER — MIDAZOLAM HCL 2 MG/2ML IJ SOLN
INTRAMUSCULAR | Status: DC | PRN
  Administered 2017-12-08: 1 mg via INTRAVENOUS

## 2017-12-08 MED ORDER — MIDAZOLAM HCL 2 MG/2ML IJ SOLN
INTRAMUSCULAR | Status: AC
  Filled 2017-12-08: qty 2

## 2017-12-08 SURGICAL SUPPLY — 8 items
CATH INFINITI 5FR ANG PIGTAIL (CATHETERS) ×2 IMPLANT
CATH INFINITI 5FR JL4 (CATHETERS) ×2 IMPLANT
CATH INFINITI JR4 5F (CATHETERS) ×2 IMPLANT
KIT MANI 3VAL PERCEP (MISCELLANEOUS) ×2 IMPLANT
NEEDLE PERC 18GX7CM (NEEDLE) ×2 IMPLANT
PACK CARDIAC CATH (CUSTOM PROCEDURE TRAY) ×2 IMPLANT
SHEATH AVANTI 5FR X 11CM (SHEATH) ×2 IMPLANT
WIRE GUIDERIGHT .035X150 (WIRE) ×2 IMPLANT

## 2017-12-08 NOTE — Progress Notes (Signed)
[facility] Encounter Note  Patient: Wesley Adams / Admit Date: 12/06/2017 / Date of Encounter: 12/08/2017, 9:24 AM   Subjective: Patient now has resolved chest discomfort but minimal amount of discomfort with deep breathing.  No evidence of myocardial infarction with normal troponin. Stress Myoview showed normal myocardial perfusion Cardiac catheterization shows normal LV systolic function with ejection fraction of 60% Minimal atherosclerosis of left main ostial left anterior descending artery and ostial left circumflex with also minimal stenosis of obtuse marginal 3  Review of Systems: Positive for: Chest pain Negative for: Vision change, hearing change, syncope, dizziness, nausea, vomiting,diarrhea, bloody stool, stomach pain, cough, congestion, diaphoresis, urinary frequency, urinary pain,skin lesions, skin rashes Others previously listed  Objective: Telemetry: Sinus rhythm Physical Exam: Blood pressure (!) 173/88, pulse (!) 54, temperature 98.2 F (36.8 C), temperature source Oral, resp. rate 18, height 6\' 1"  (1.854 m), weight 93.4 kg, SpO2 99 %. Body mass index is 27.15 kg/m. General: Well developed, well nourished, in no acute distress. Head: Normocephalic, atraumatic, sclera non-icteric, no xanthomas, nares are without discharge. Neck: No apparent masses Lungs: Normal respirations with no wheezes, no rhonchi, no rales , no crackles   Heart: Regular rate and rhythm, normal S1 S2, no murmur, no rub, no gallop, PMI is normal size and placement, carotid upstroke normal without bruit, jugular venous pressure normal Abdomen: Soft, non-tender, non-distended with normoactive bowel sounds. No hepatosplenomegaly. Abdominal aorta is normal size without bruit Extremities: No edema, no clubbing, no cyanosis, no ulcers,  Peripheral: 2+ radial, 2+ femoral, 2+ dorsal pedal pulses Neuro: Alert and oriented. Moves all extremities spontaneously. Psych:  Responds to  questions appropriately with a normal affect.   Intake/Output Summary (Last 24 hours) at 12/08/2017 0924 Last data filed at 12/08/2017 0604 Gross per 24 hour  Intake -  Output 300 ml  Net -300 ml    Inpatient Medications:  . [MAR Hold] aspirin EC  81 mg Oral Daily  . [MAR Hold] enoxaparin (LOVENOX) injection  40 mg Subcutaneous Q24H  . [MAR Hold] nicotine  21 mg Transdermal Daily  . sodium chloride flush  3 mL Intravenous Q12H   Infusions:  . sodium chloride    . sodium chloride      Labs: Recent Labs    12/06/17 1637 12/06/17 2033  NA 137  --   K 4.1  --   CL 105  --   CO2 26  --   GLUCOSE 92  --   BUN 12  --   CREATININE 0.92 0.98  CALCIUM 9.5  --    Recent Labs    12/06/17 1637  AST 24  ALT 21  ALKPHOS 64  BILITOT 0.6  PROT 7.6  ALBUMIN 4.4   Recent Labs    12/06/17 1637 12/06/17 2033  WBC 9.2 7.3  HGB 14.9 14.3  HCT 43.6 42.0  MCV 91.3 90.7  PLT 183 175   Recent Labs    12/06/17 1637 12/06/17 2033 12/06/17 2347 12/07/17 0452  TROPONINI 0.03* 0.03* 0.03* 0.03*   Invalid input(s): POCBNP No results for input(s): HGBA1C in the last 72 hours.   Weights: Filed Weights   12/06/17 2028 12/07/17 2153  Weight: 95.3 kg 93.4 kg     Radiology/Studies:  Ct Angio Chest/abd/pel For Dissection W And/or Wo Contrast  Result Date: 12/06/2017 CLINICAL DATA:  Intermittent chest pressure and worsening shortness of breath since Saturday. EXAM: CT ANGIOGRAPHY CHEST, ABDOMEN AND PELVIS TECHNIQUE: Multidetector CT imaging through the chest, abdomen and  pelvis was performed using the standard protocol during bolus administration of intravenous contrast. Multiplanar reconstructed images and MIPs were obtained and reviewed to evaluate the vascular anatomy. CONTRAST:  140mL OMNIPAQUE IOHEXOL 350 MG/ML SOLN COMPARISON:  CT abdomen pelvis dated April 29, 2017. FINDINGS: CTA CHEST FINDINGS Cardiovascular: Preferential opacification of the thoracic aorta. No evidence of  thoracic aortic aneurysm or dissection. Aberrant origin of the right subclavian artery. Coronary, aortic arch, and branch vessel atherosclerotic vascular disease. normal heart size. No pericardial effusion. No central pulmonary embolism. Mediastinum/Nodes: No enlarged mediastinal, hilar, or axillary lymph nodes. Thyroid gland, trachea, and esophagus demonstrate no significant findings. Lungs/Pleura: No focal consolidation, pleural effusion, or pneumothorax. No suspicious pulmonary nodule. Musculoskeletal: No chest wall abnormality. No acute or significant osseous findings. Review of the MIP images confirms the above findings. CTA ABDOMEN AND PELVIS FINDINGS VASCULAR Aorta: Normal caliber aorta without aneurysm, dissection, vasculitis or significant stenosis. Mild atherosclerosis. Celiac: Patent without evidence of aneurysm, dissection, vasculitis or significant stenosis. SMA: Patent without evidence of aneurysm, dissection, vasculitis or significant stenosis. Renals: Single right and two left renal arteries are patent without evidence of aneurysm, dissection, vasculitis, fibromuscular dysplasia or significant stenosis. IMA: Patent without evidence of aneurysm, dissection, vasculitis or significant stenosis. Inflow: Mild stenosis of the distal left common iliac artery due to calcified and noncalcified atherosclerotic plaque. Otherwise patent without evidence of aneurysm, dissection, vasculitis or significant stenosis. Veins: No obvious venous abnormality within the limitations of this arterial phase study. Review of the MIP images confirms the above findings. NON-VASCULAR Hepatobiliary: No focal liver abnormality is seen. No gallstones, gallbladder wall thickening, or biliary dilatation. Pancreas: Unremarkable. No pancreatic ductal dilatation or surrounding inflammatory changes. Spleen: Normal in size without focal abnormality. Adrenals/Urinary Tract: Adrenal glands are unremarkable. Kidneys are normal, without renal  calculi, focal lesion, or hydronephrosis. Unchanged simple cyst in the right kidney. Bladder is unremarkable. Stomach/Bowel: Stomach is within normal limits. Appendix appears normal. No evidence of bowel wall thickening, distention, or inflammatory changes. Lymphatic: No enlarged abdominal or pelvic lymph nodes. Reproductive: Prostate is unremarkable. Other: No abdominal wall hernia or abnormality. No abdominopelvic ascites. No pneumoperitoneum. Musculoskeletal: No acute or significant osseous findings. 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 Chronic deformity of the left iliac crest. Prior L4-L5 fusion. Interval removal of the spinal cord stimulator in the left posterior abdominal wall. Review of the MIP images confirms the above findings. IMPRESSION: 1. No acute thoracoabdominal aortic pathology.  No aneurysm. 2.  Aortic atherosclerosis (ICD10-I70.0). Electronically Signed   By: Titus Dubin M.D.   On: 12/06/2017 18:07     Assessment and Recommendation  52 y.o. male with known risk factors of cardiovascular disease including family history of cardiovascular disease hyperlipidemia and tobacco abuse having unstable angina with a cardiac catheterization showing normal LV systolic function and minimal atherosclerosis without evidence of significant stenosis 1.  No further cardiac diagnostics necessary at this time 2.  High intensity cholesterol therapy 3.  Abstinence of tobacco abuse 4.  Aspirin 81 mg each day 5.  Okay for discharged home from cardiac standpoint with follow-up in the next 2 weeks  Signed, Serafina Royals M.D. FACC

## 2017-12-08 NOTE — Progress Notes (Signed)
Pre procedure report given to Erin, RN/ RN made aware of high BP/ pt agitated about not having food / RN instructed to proceed to send pt to cath lab and she would make Dr. Nehemiah Massed aware

## 2017-12-08 NOTE — Progress Notes (Signed)
[place] at [place] was admitted to the Hospital on 12/06/2017 and Discharged  12/08/2017 and should be excused from work/school   for 8  days starting 12/06/2017 , may return to work/school without any restrictions.  Call Dustin Flock MD with questions.  Dustin Flock M.D on 12/08/2017,at 12:36 PM  [place] at [facility]  [phone]

## 2017-12-08 NOTE — Discharge Summary (Signed)
[facility] - [place] at [facility], 52 y.o., DOB Jul 20, 1965, MRN [id number]. Admission date: 12/06/2017 Discharge Date 12/08/2017 Primary MD Steele Sizer, MD Admitting Physician Henreitta Leber, MD  Admission Diagnosis  Moderate risk chest pain [R07.9] Hematuria, unspecified type [R31.9]  Discharge Diagnosis   Active Problems: Chest pain noncardiac very atypical status post As well as CT of the chest Chronic back pain Nicotine abuse    Hospital Course Patient is a 52 year old who presented with complaint of severe left-sided chest pain which was reproducible.  His cardiac enzymes and EKG was negative.  He underwent a CT of the chest first which showed no PE no dissection no other abnormality.  He continued to complain of chest pain therefore had a stress test which was normal.  I explained to him that all his blood work was negative he continued to complain of chest pain.  Therefore cardiology consult was obtained.Marland Kitchen  He underwent a cardiac cath which showed minimally obstructive disease with 20 to 10% arthrosclerosis.  Patient's chest pain is very atypical and noncardiac or pulmonary related.  I will try PPIs for him.  Patient is to follow-up with primary care provider if needed further GI work-up.             Consults  cardiology  Significant Tests:  See full reports for all details     Nm Myocar Multi W/spect W/wall Motion / Ef  Result Date: 12/08/2017  The study is normal.  This is a low risk study.  The left ventricular ejection fraction is normal (55-65%).  Blood pressure demonstrated a normal response to exercise.  There was no ST segment deviation noted during stress.    Ct Angio Chest/abd/pel For Dissection W And/or Wo Contrast  Result Date: 12/06/2017 CLINICAL DATA:  Intermittent chest pressure and worsening shortness of breath since Saturday. EXAM: CT ANGIOGRAPHY CHEST, ABDOMEN AND PELVIS TECHNIQUE: Multidetector CT imaging  through the chest, abdomen and pelvis was performed using the standard protocol during bolus administration of intravenous contrast. Multiplanar reconstructed images and MIPs were obtained and reviewed to evaluate the vascular anatomy. CONTRAST:  186mL OMNIPAQUE IOHEXOL 350 MG/ML SOLN COMPARISON:  CT abdomen pelvis dated April 29, 2017. FINDINGS: CTA CHEST FINDINGS Cardiovascular: Preferential opacification of the thoracic aorta. No evidence of thoracic aortic aneurysm or dissection. Aberrant origin of the right subclavian artery. Coronary, aortic arch, and branch vessel atherosclerotic vascular disease. normal heart size. No pericardial effusion. No central pulmonary embolism. Mediastinum/Nodes: No enlarged mediastinal, hilar, or axillary lymph nodes. Thyroid gland, trachea, and esophagus demonstrate no significant findings. Lungs/Pleura: No focal consolidation, pleural effusion, or pneumothorax. No suspicious pulmonary nodule. Musculoskeletal: No chest wall abnormality. No acute or significant osseous findings. Review of the MIP images confirms the above findings. CTA ABDOMEN AND PELVIS FINDINGS VASCULAR Aorta: Normal caliber aorta without aneurysm, dissection, vasculitis or significant stenosis. Mild atherosclerosis. Celiac: Patent without evidence of aneurysm, dissection, vasculitis or significant stenosis. SMA: Patent without evidence of aneurysm, dissection, vasculitis or significant stenosis. Renals: Single right and two left renal arteries are patent without evidence of aneurysm, dissection, vasculitis, fibromuscular dysplasia or significant stenosis. IMA: Patent without evidence of aneurysm, dissection, vasculitis or significant stenosis. Inflow: Mild stenosis of the distal left common iliac artery due to calcified and noncalcified atherosclerotic plaque. Otherwise patent without evidence of aneurysm, dissection, vasculitis or significant stenosis. Veins: No obvious venous abnormality within the  limitations of this arterial phase study. Review of the MIP images confirms the above  findings. NON-VASCULAR Hepatobiliary: No focal liver abnormality is seen. No gallstones, gallbladder wall thickening, or biliary dilatation. Pancreas: Unremarkable. No pancreatic ductal dilatation or surrounding inflammatory changes. Spleen: Normal in size without focal abnormality. Adrenals/Urinary Tract: Adrenal glands are unremarkable. Kidneys are normal, without renal calculi, focal lesion, or hydronephrosis. Unchanged simple cyst in the right kidney. Bladder is unremarkable. Stomach/Bowel: Stomach is within normal limits. Appendix appears normal. No evidence of bowel wall thickening, distention, or inflammatory changes. Lymphatic: No enlarged abdominal or pelvic lymph nodes. Reproductive: Prostate is unremarkable. Other: No abdominal wall hernia or abnormality. No abdominopelvic ascites. No pneumoperitoneum. Musculoskeletal: No acute or significant osseous findings. 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 Chronic deformity of the left iliac crest. Prior L4-L5 fusion. Interval removal of the spinal cord stimulator in the left posterior abdominal wall. Review of the MIP images confirms the above findings. IMPRESSION: 1. No acute thoracoabdominal aortic pathology.  No aneurysm. 2.  Aortic atherosclerosis (ICD10-I70.0). Electronically Signed   By: Titus Dubin M.D.   On: 12/06/2017 18:07       Today   Subjective:   Wesley Adams patient doing better complaining of some left-sided sharp pain Objective:   Blood pressure (!) 158/94, pulse (!) 54, temperature 98.2 F (36.8 C), temperature source Oral, resp. rate 18, height 6\' 1"  (1.854 m), weight 93.4 kg, SpO2 100 %.  .  Intake/Output Summary (Last 24 hours) at 12/08/2017 1457 Last data filed at 12/08/2017 0604 Gross per 24 hour  Intake -  Output 300 ml  Net -300 ml    Exam VITAL  SIGNS: Blood pressure (!) 158/94, pulse (!) 54, temperature 98.2 F (36.8 C), temperature source Oral, resp. rate 18, height 6\' 1"  (1.854 m), weight 93.4 kg, SpO2 100 %.  GENERAL:  52 y.o.-year-old patient lying in the bed with no acute distress.  EYES: Pupils equal, round, reactive to light and accommodation. No scleral icterus. Extraocular muscles intact.  HEENT: Head atraumatic, normocephalic. Oropharynx and nasopharynx clear.  NECK:  Supple, no jugular venous distention. No thyroid enlargement, no tenderness.  LUNGS: Normal breath sounds bilaterally, no wheezing, rales,rhonchi or crepitation. No use of accessory muscles of respiration.  CARDIOVASCULAR: S1, S2 normal. No murmurs, rubs, or gallops.  ABDOMEN: Soft, nontender, nondistended. Bowel sounds present. No organomegaly or mass.  EXTREMITIES: No pedal edema, cyanosis, or clubbing.  NEUROLOGIC: Cranial nerves II through XII are intact. Muscle strength 5/5 in all extremities. Sensation intact. Gait not checked.  PSYCHIATRIC: The patient is alert and oriented x 3.  SKIN: No obvious rash, lesion, or ulcer.   Data Review     CBC w Diff:  Lab Results  Component Value Date   WBC 7.3 12/06/2017   HGB 14.3 12/06/2017   HCT 42.0 12/06/2017   PLT 175 12/06/2017   CMP:  Lab Results  Component Value Date   NA 137 12/06/2017   K 4.1 12/06/2017   CL 105 12/06/2017   CO2 26 12/06/2017   BUN 12 12/06/2017   CREATININE 0.98 12/06/2017   CREATININE 0.89 04/29/2017   PROT 7.6 12/06/2017   ALBUMIN 4.4 12/06/2017   BILITOT 0.6 12/06/2017   ALKPHOS 64 12/06/2017   AST 24 12/06/2017   ALT 21 12/06/2017  .  Micro Results No results found for this or any previous visit (from the past 240 hour(s)).      Code Status Orders  (From admission, onward)         Start  Ordered   12/06/17 1952  Full code  Continuous     12/06/17 1951        Code Status History    Date Active Date Inactive Code Status Order ID Comments User  Context   01/30/2015 1724 01/31/2015 1518 Full Code [id number]  Newman Pies, MD Inpatient          Follow-up Information    Steele Sizer, MD In 6 days.   Specialty:  Family Medicine Why:  Appointment Time: office will call patient with appt. Contact information: [address] [place] [address] [phone]           Discharge Medications   Allergies as of 12/08/2017   No Known Allergies     Medication List    TAKE these medications   acetaminophen 325 MG tablet Commonly known as:  TYLENOL Take 2 tablets (650 mg total) by mouth every 6 (six) hours as needed for mild pain (or Fever >/= 101).   aspirin 81 MG EC tablet Take 1 tablet (81 mg total) by mouth daily.   HYDROcodone-acetaminophen 10-325 MG tablet Commonly known as:  NORCO Take 1 tablet by mouth every 6 (six) hours as needed for up to 7 days for severe pain.   pantoprazole 40 MG tablet Commonly known as:  PROTONIX Take 1 tablet (40 mg total) by mouth daily.   tiZANidine 4 MG tablet Commonly known as:  ZANAFLEX Take 1 tablet (4 mg total) by mouth every 8 (eight) hours as needed for muscle spasms.          Total Time in preparing paper work, data evaluation and todays exam - 77 minutes  Dustin Flock M.D on 12/08/2017 at 2:57 PM [place]  [phone]

## 2017-12-08 NOTE — Progress Notes (Signed)
Discharge instructions explained to pt / verbalized an understanding/ iv and tele removed/ pt ambulated around nursing station/ tolerated well/ r groin site WNL/ RX and work note given to pt/ transported off unit via wheelchair.

## 2017-12-09 ENCOUNTER — Telehealth: Payer: Self-pay

## 2017-12-09 NOTE — Telephone Encounter (Signed)
TOC #1. Called pt to f/u after d/c from [facility] on 12/08/17. Also wanted to confirm his hosp f/u appt w/ Dr. Ancil Boozer on 12/12/17 @ 11:40am. Discharge planning includes the following:  - Start ASA, tylenol and protonix - Continue heart healthy diet F/u Dr. Ancil Boozer in 6 days LVM requesting returned call.

## 2017-12-12 ENCOUNTER — Encounter: Payer: Self-pay | Admitting: Family Medicine

## 2017-12-12 ENCOUNTER — Inpatient Hospital Stay: Payer: BLUE CROSS/BLUE SHIELD | Admitting: Family Medicine

## 2017-12-12 ENCOUNTER — Encounter: Payer: Self-pay | Admitting: Emergency Medicine

## 2017-12-12 ENCOUNTER — Ambulatory Visit: Payer: BLUE CROSS/BLUE SHIELD | Admitting: Family Medicine

## 2017-12-12 VITALS — BP 124/76 | HR 62 | Temp 97.8°F | Resp 16 | Ht 72.0 in | Wt 208.3 lb

## 2017-12-12 DIAGNOSIS — Z720 Tobacco use: Secondary | ICD-10-CM

## 2017-12-12 DIAGNOSIS — R0602 Shortness of breath: Secondary | ICD-10-CM | POA: Diagnosis not present

## 2017-12-12 DIAGNOSIS — R351 Nocturia: Secondary | ICD-10-CM

## 2017-12-12 DIAGNOSIS — R319 Hematuria, unspecified: Secondary | ICD-10-CM | POA: Diagnosis not present

## 2017-12-12 DIAGNOSIS — R079 Chest pain, unspecified: Secondary | ICD-10-CM

## 2017-12-12 DIAGNOSIS — I700 Atherosclerosis of aorta: Secondary | ICD-10-CM | POA: Diagnosis not present

## 2017-12-12 DIAGNOSIS — I2510 Atherosclerotic heart disease of native coronary artery without angina pectoris: Secondary | ICD-10-CM | POA: Insufficient documentation

## 2017-12-12 DIAGNOSIS — I771 Stricture of artery: Secondary | ICD-10-CM

## 2017-12-12 DIAGNOSIS — Z09 Encounter for follow-up examination after completed treatment for conditions other than malignant neoplasm: Secondary | ICD-10-CM

## 2017-12-12 DIAGNOSIS — I25119 Atherosclerotic heart disease of native coronary artery with unspecified angina pectoris: Secondary | ICD-10-CM | POA: Diagnosis not present

## 2017-12-12 MED ORDER — NICOTINE 21 MG/24HR TD PT24: 21.0000 mg | MEDICATED_PATCH | Freq: Every day | TRANSDERMAL | 0 refills | Status: DC

## 2017-12-12 NOTE — Patient Instructions (Signed)

## 2017-12-12 NOTE — Progress Notes (Signed)
Name: Wesley Adams   MRN: [id number]    DOB: [birth year]   Date:12/12/2017       Progress Note  Subjective  Chief Complaint  Chief Complaint  Patient presents with  . Hospitalization Follow-up  . Shortness of Breath    HPI  Pt presents for hospital admission follow up s/p admission on 12/06/2017 for chest pain and hematuria.  Chest Pain: Per notes from Dr. Posey Pronto (hospitalist), CP was reproducible and did not appear to be cardiac or pulmonary in nature.  Negative CT chest, unremarkable ECG, and negative delta troponins. BNP was normal, lipase and CMP both normal.  Had cardiac cath by Dr. Nehemiah Massed on 12/08/2017 - notes reports normal wall motion, 10-35% atherosclerosis; moderate CAD, with recommendation to start ASA 81mg  daily. Pt does endorse right groin soreness s/p cath - endorses some ecchymosis but no swelling/hematoma formation.   He reports that his chest pain improved - has been taking Protonix and aspirin.  Chest pain is about a 2-3/10 now; down from an 8-9/10 prior to hospital admission. Current concern is that he feels short of breath with exertion now - he is a current smoker, is trying to quit (see below). - He was placed on protonix and told to follow up with PCP to consider possible GI referral.   - We will obtain fasting lipids today.    - He is UTD on colonoscopy.   Tobacco Use: Prior to admission, was smoking 1-1.5ppd.  He tried to smoke after discharge and got really nauseous.  He would like to be put back on patches as he did well on these in the hospital.  Hematuria: UA in ER during hospital admission showed small amount of hemoglobin, CBC did not show anemia, PT/INR normal (pt is not anticoagulated).  He denies any hematuria since discharge.  We will refer to urology for further evaluation due to occult hematuria in the hospital.  He cut all juices and sodas, has only been drinking water.  CT Angio chest/abdomen/pelvis - states no renal calculi, no liver abnormalities,  no gall stones, pancreas was unremarkable.  No family history prostate cancer or BPH.  IPSS Questionnaire (AUA-7): Over the past month.   1)  How often have you had a sensation of not emptying your bladder completely after you finish urinating?  1 - Less than 1 time in 5  2)  How often have you had to urinate again less than two hours after you finished urinating? 3 - About half the time  3)  How often have you found you stopped and started again several times when you urinated?  2 - Less than half the time  4) How difficult have you found it to postpone urination?  4 - More than half the time  5) How often have you had a weak urinary stream?  0 - Not at all  6) How often have you had to push or strain to begin urination?  0 - Not at all  7) How many times did you most typically get up to urinate from the time you went to bed until the time you got up in the morning?  2 - 2 times  Total score:  0-7 mildly symptomatic   8-19 moderately symptomatic   20-35 severely symptomatic  Score of 12 - will refer to urology.  Patient Active Problem List   Diagnosis Date Noted  . Coronary artery disease involving native heart 12/12/2017  . Chest pain 12/06/2017  . Iliac  artery stenosis, left ([place]) 12/06/2017  . Atherosclerosis of aorta ([place]) 12/06/2017  . Renal stones 12/06/2017  . Long term current use of opiate analgesic 11/03/2017  . Chronic bilateral low back pain with bilateral sciatica 10/03/2017  . Chronic pain syndrome 10/03/2017  . Opiate use 10/03/2017  . Epigastric pain   . Special screening for malignant neoplasms, colon   . Chronic, continuous use of opioids 05/27/2017  . Tobacco use 05/27/2017  . Spinal cord stimulator status 04/29/2017  . Acute low back pain without sciatica 11/04/2015  . Lumbar degenerative disc disease 01/30/2015    Past Surgical History:  Procedure Laterality Date  . BACK SURGERY  Feb 2016   cut disc off of nerve with Dr Arnoldo Morale  . COLONOSCOPY WITH  PROPOFOL N/A 06/01/2017   Procedure: COLONOSCOPY WITH PROPOFOL;  Surgeon: Lin Landsman, MD;  Location: [facility];  Service: Endoscopy;  Laterality: N/A;  . ESOPHAGOGASTRODUODENOSCOPY (EGD) WITH PROPOFOL  06/01/2017   Procedure: ESOPHAGOGASTRODUODENOSCOPY (EGD) WITH PROPOFOL;  Surgeon: Lin Landsman, MD;  Location: [employer] [facility];  Service: Endoscopy;;  . FRACTURE SURGERY Right as child   rod in right femur  . LEFT HEART CATH AND CORONARY ANGIOGRAPHY N/A 12/08/2017   Procedure: LEFT HEART CATH AND CORONARY ANGIOGRAPHY;  Surgeon: Corey Skains, MD;  Location: [facility];  Service: Cardiovascular;  Laterality: N/A;  . POLYPECTOMY  06/01/2017   Procedure: POLYPECTOMY;  Surgeon: Lin Landsman, MD;  Location: [facility];  Service: Endoscopy;;  . SPINAL CORD STIMULATOR INSERTION  03/23/2017   Dr Newman Pies, [facility]  . SPINAL CORD STIMULATOR REMOVAL N/A 06/16/2017   Procedure: SPINAL CORD STIMULATOR REMOVAL;  Surgeon: Newman Pies, MD;  Location: [facility];  Service: Neurosurgery;  Laterality: N/A;    Family History  Problem Relation Age of Onset  . Alcohol abuse Mother   . Heart attack Father   . Heart disease Father   . Obesity Father   . Heart attack Paternal Grandfather     Social History   Socioeconomic History  . Marital status: Single    Spouse name: Not on file  . Number of children: Not on file  . Years of education: Not on file  . Highest education level: Not on file  Occupational History  . Not on file  Social Needs  . Financial resource strain: Not on file  . Food insecurity:    Worry: Not on file    Inability: Not on file  . Transportation needs:    Medical: Not on file    Non-medical: Not on file  Tobacco Use  . Smoking status: Current Every Day Smoker    Packs/day: 1.00    Years: 30.00    Pack years: 30.00    Types: Cigarettes  . Smokeless tobacco: Never Used  . Tobacco comment: down to 1/2  PPD  Substance and Sexual Activity  . Alcohol use: Yes    Alcohol/week: 3.0 standard drinks    Types: 3 Cans of beer per week    Comment: beer on weekend  . Drug use: No  . Sexual activity: Not on file  Lifestyle  . Physical activity:    Days per week: Not on file    Minutes per session: Not on file  . Stress: Not on file  Relationships  . Social connections:    Talks on phone: Not on file    Gets together: Not on file    Attends religious service: Not on  file    Active member of club or organization: Not on file    Attends meetings of clubs or organizations: Not on file    Relationship status: Not on file  . Intimate partner violence:    Fear of current or ex partner: Not on file    Emotionally abused: Not on file    Physically abused: Not on file    Forced sexual activity: Not on file  Other Topics Concern  . Not on file  Social History Narrative  . Not on file     Current Outpatient Medications:  .  acetaminophen (TYLENOL) 325 MG tablet, Take 2 tablets (650 mg total) by mouth every 6 (six) hours as needed for mild pain (or Fever >/= 101)., Disp: , Rfl:  .  aspirin EC 81 MG EC tablet, Take 1 tablet (81 mg total) by mouth daily., Disp: , Rfl:  .  HYDROcodone-acetaminophen (NORCO) 10-325 MG tablet, Take 1 tablet by mouth every 6 (six) hours as needed for up to 7 days for severe pain., Disp: 28 tablet, Rfl: 0 .  pantoprazole (PROTONIX) 40 MG tablet, Take 1 tablet (40 mg total) by mouth daily., Disp: 30 tablet, Rfl: 11 .  tiZANidine (ZANAFLEX) 4 MG tablet, Take 1 tablet (4 mg total) by mouth every 8 (eight) hours as needed for muscle spasms., Disp: 90 tablet, Rfl: 1 .  nicotine (NICODERM CQ - DOSED IN MG/24 HOURS) 21 mg/24hr patch, Place 1 patch (21 mg total) onto the skin daily., Disp: 28 patch, Rfl: 0  No Known Allergies  ROS Constitutional: Negative for fever or weight change.  Respiratory: Negative for cough; endoreses some shortness of breath with exertion.     Cardiovascular: Positive for chest pain; negative palpitations.  Gastrointestinal: Negative for abdominal pain, no bowel changes.  Musculoskeletal: Negative for gait problem or joint swelling.  Skin: Negative for rash.  Neurological: Negative for dizziness or headache.  No other specific complaints in a complete review of systems (except as listed in HPI above).  Objective  Vitals:   12/12/17 0745  BP: 124/76  Pulse: 62  Resp: 16  Temp: 97.8 F (36.6 C)  TempSrc: Oral  SpO2: 99%  Weight: 208 lb 4.8 oz (94.5 kg)  Height: 6' (1.829 m)   Body mass index is 28.25 kg/m.  Physical Exam Constitutional: Patient appears well-developed and well-nourished. No distress.  HENT: Head: Normocephalic and atraumatic. Ears: B TMs ok, no erythema or effusion; Nose: Nose normal. Mouth/Throat: Oropharynx is clear and moist. No oropharyngeal exudate.  Eyes: Conjunctivae and EOM are normal. Pupils are equal, round, and reactive to light. No scleral icterus.  Neck: Normal range of motion. Neck supple. No JVD present. No thyromegaly present.  Cardiovascular: Normal rate, regular rhythm and normal heart sounds.  No murmur heard. No BLE edema. Pulmonary/Chest: Effort normal and breath sounds normal. No respiratory distress. Abdominal: Soft. Bowel sounds are normal, no distension. There is no tenderness. no masses Musculoskeletal: Normal range of motion, no joint effusions. No gross deformities Neurological: he is alert and oriented to person, place, and time. No cranial nerve deficit. Coordination, balance, strength, speech and gait are normal.  Skin: Skin is warm and dry. No rash noted. Psychiatric: Patient has a normal mood and affect. behavior is normal. Judgment and thought content normal.  No results found for this or any previous visit (from the past 72 hour(s)).  PHQ2/9: Depression screen [facility] 2/9 12/12/2017 11/29/2017 11/02/2017 10/03/2017 08/25/2017  Decreased Interest 0 0 0 0 0  Down, Depressed,  Hopeless 0 0 0 0 0  PHQ - 2 Score 0 0 0 0 0  Altered sleeping 0 - - - -  Tired, decreased energy 0 - - - -  Change in appetite 0 - - - -  Feeling bad or failure about yourself  0 - - - -  Trouble concentrating 0 - - - -  Moving slowly or fidgety/restless 0 - - - -  Suicidal thoughts 0 - - - -  PHQ-9 Score 0 - - - -   Fall Risk: Fall Risk  12/12/2017 11/29/2017 11/02/2017 10/03/2017 09/12/2017  Falls in the past year? No No No No No   Assessment & Plan  1. Coronary artery disease involving native coronary artery of native heart with angina pectoris ([place]) - Discussed lifestyle modification including tobacco cessation. - Lipid panel - ASA 81mg  daily  2. Chest pain, unspecified type - Advised extensive work-up determined likely not cardiac in nature.  Reviewed labs, imaging  - Lipid panel - CBC - BASIC METABOLIC PANEL WITH GFR - ASA 81mg  daily  3. Hematuria, unspecified type - CBC - BASIC METABOLIC PANEL WITH GFR - Ambulatory referral to Urology  4. Atherosclerosis of aorta ([facility]) - Lipid panel - ASA 81mg  daily  5. Iliac artery stenosis, left ([facility]) - Lipid panel - ASA 81mg  daily  6. Hospital discharge follow-up  7. Tobacco use - nicotine (NICODERM CQ - DOSED IN MG/24 HOURS) 21 mg/24hr patch; Place 1 patch (21 mg total) onto the skin daily.  Dispense: 28 patch; Refill: 0 - Advised follow up with PCP in 1 month to discuss tapering down on nicotine patch dosing.  8. Nocturia - Ambulatory referral to Urology  9. Shortness of breath - PR EVAL OF BRONCHOSPASM - FEV1 Pre is 96% predicted, FEV1 Post is 101% predicted; no additional action at this time; do not suspect COPD, though smoking cessation is advised. - CT angio chest/abdomen/pelvis was negative for blood clot in hospital, BNP was normal in hospital.  Question if deconditioning from hospital stay vs other etiology. - Etiology is unclear today, pt recently had extensive cardiac work-up as an in-patient, CT angio  chest/abdomen/pelvis was negative for thrombus, normal BNP, and a heart catheterization showing normal wall motion with moderate atherosclerosis.  Return precautions discussed in detail, work note provided for this week to allow patient to recooperate - advised if not improving in 3-5 days he needs to return to clinic for additional evaluation.

## 2017-12-13 LAB — CBC
HCT: 44.3 % (ref 38.5–50.0)
Hemoglobin: 15 g/dL (ref 13.2–17.1)
MCH: 30.3 pg (ref 27.0–33.0)
MCHC: 33.9 g/dL (ref 32.0–36.0)
MCV: 89.5 fL (ref 80.0–100.0)
MPV: 10.8 fL (ref 7.5–12.5)
PLATELETS: 204 10*3/uL (ref 140–400)
RBC: 4.95 10*6/uL (ref 4.20–5.80)
RDW: 13.4 % (ref 11.0–15.0)
WBC: 6.7 10*3/uL (ref 3.8–10.8)

## 2017-12-13 LAB — BASIC METABOLIC PANEL WITH GFR
BUN: 15 mg/dL (ref 7–25)
CHLORIDE: 102 mmol/L (ref 98–110)
CO2: 29 mmol/L (ref 20–32)
CREATININE: 0.83 mg/dL (ref 0.70–1.33)
Calcium: 9.7 mg/dL (ref 8.6–10.3)
GFR, Est African American: 117 mL/min/{1.73_m2} (ref >=60)
GFR, Est Non African American: 101 mL/min/{1.73_m2} (ref >=60)
GLUCOSE: 89 mg/dL (ref 65–99)
Potassium: 4.7 mmol/L (ref 3.5–5.3)
SODIUM: 138 mmol/L (ref 135–146)

## 2017-12-13 LAB — LIPID PANEL
Cholesterol: 199 mg/dL (ref <200)
HDL: 46 mg/dL (ref >40)
LDL CHOLESTEROL (CALC): 133 mg/dL — AB
Non-HDL Cholesterol (Calc): 153 mg/dL (calc) — ABNORMAL HIGH (ref <130)
Total CHOL/HDL Ratio: 4.3 (calc) (ref <5.0)
Triglycerides: 97 mg/dL (ref <150)

## 2017-12-14 ENCOUNTER — Telehealth: Payer: Self-pay | Admitting: Emergency Medicine

## 2017-12-14 NOTE — Telephone Encounter (Signed)
Please call to have him come in for BP check this afternoon. If unable to come in, he needs to go to the ER for further evaluation.

## 2017-12-14 NOTE — Telephone Encounter (Signed)
Will come tomorrow for BP check

## 2017-12-14 NOTE — Telephone Encounter (Signed)
Sister Langley Gauss called and stated her brother has been having frequent headaches, vision changes and dizziness. Was not able th check BP today

## 2017-12-16 ENCOUNTER — Telehealth: Payer: Self-pay | Admitting: Emergency Medicine

## 2017-12-16 NOTE — Telephone Encounter (Signed)
Patient notified

## 2017-12-16 NOTE — Telephone Encounter (Signed)
Patient stated his Blood pressure on yesterday was 135/74 and today was 136/68. He stated is it normal for him to have bruising that has came up around area of Cath. Has 3 bruises that has appeared.

## 2017-12-16 NOTE — Telephone Encounter (Signed)
BP readings are normal. Bruising can occur in the groin where he had the catheterization. If the bruising is brand new and significant, painful, or has surrounding redness, he needs to be evaluated.

## 2017-12-27 ENCOUNTER — Ambulatory Visit: Payer: BLUE CROSS/BLUE SHIELD | Attending: Nurse Practitioner | Admitting: Nurse Practitioner

## 2017-12-27 ENCOUNTER — Encounter: Payer: Self-pay | Admitting: Nurse Practitioner

## 2017-12-27 VITALS — BP 158/85 | HR 69 | Temp 98.0°F | Resp 71 | Ht 73.0 in | Wt 210.0 lb

## 2017-12-27 DIAGNOSIS — G894 Chronic pain syndrome: Secondary | ICD-10-CM | POA: Insufficient documentation

## 2017-12-27 DIAGNOSIS — Z7982 Long term (current) use of aspirin: Secondary | ICD-10-CM | POA: Diagnosis not present

## 2017-12-27 DIAGNOSIS — M5441 Lumbago with sciatica, right side: Secondary | ICD-10-CM | POA: Diagnosis not present

## 2017-12-27 DIAGNOSIS — E7800 Pure hypercholesterolemia, unspecified: Secondary | ICD-10-CM | POA: Insufficient documentation

## 2017-12-27 DIAGNOSIS — M5442 Lumbago with sciatica, left side: Secondary | ICD-10-CM | POA: Diagnosis not present

## 2017-12-27 DIAGNOSIS — M545 Low back pain: Secondary | ICD-10-CM | POA: Diagnosis present

## 2017-12-27 DIAGNOSIS — M5136 Other intervertebral disc degeneration, lumbar region: Secondary | ICD-10-CM | POA: Diagnosis not present

## 2017-12-27 DIAGNOSIS — Z79899 Other long term (current) drug therapy: Secondary | ICD-10-CM | POA: Diagnosis not present

## 2017-12-27 DIAGNOSIS — Z811 Family history of alcohol abuse and dependence: Secondary | ICD-10-CM | POA: Insufficient documentation

## 2017-12-27 DIAGNOSIS — F17210 Nicotine dependence, cigarettes, uncomplicated: Secondary | ICD-10-CM | POA: Diagnosis not present

## 2017-12-27 DIAGNOSIS — I700 Atherosclerosis of aorta: Secondary | ICD-10-CM | POA: Diagnosis not present

## 2017-12-27 DIAGNOSIS — I2510 Atherosclerotic heart disease of native coronary artery without angina pectoris: Secondary | ICD-10-CM | POA: Diagnosis not present

## 2017-12-27 DIAGNOSIS — M5116 Intervertebral disc disorders with radiculopathy, lumbar region: Secondary | ICD-10-CM | POA: Insufficient documentation

## 2017-12-27 DIAGNOSIS — Z79891 Long term (current) use of opiate analgesic: Secondary | ICD-10-CM | POA: Diagnosis not present

## 2017-12-27 DIAGNOSIS — G8929 Other chronic pain: Secondary | ICD-10-CM

## 2017-12-27 DIAGNOSIS — Z8249 Family history of ischemic heart disease and other diseases of the circulatory system: Secondary | ICD-10-CM | POA: Diagnosis not present

## 2017-12-27 MED ORDER — TIZANIDINE HCL 4 MG PO TABS: 4.0000 mg | ORAL_TABLET | Freq: Three times a day (TID) | ORAL | 1 refills | Status: DC | PRN

## 2017-12-27 MED ORDER — HYDROCODONE-ACETAMINOPHEN 10-325 MG PO TABS: 1.0000 | ORAL_TABLET | Freq: Four times a day (QID) | ORAL | 0 refills | Status: DC | PRN

## 2017-12-27 NOTE — Patient Instructions (Addendum)
Rx for hydrocodone with acetaminophen and zanaflex to last until 02/03/2018 has been escribed to your pharmacy.____________________________________________________________________________________________  Medication Rules  Applies to: All patients receiving prescriptions (written or electronic).  Pharmacy of record: Pharmacy where electronic prescriptions will be sent. If written prescriptions are taken to a different pharmacy, please inform the nursing staff. The pharmacy listed in the electronic medical record should be the one where you would like electronic prescriptions to be sent.  Prescription refills: Only during scheduled appointments. Applies to both, written and electronic prescriptions.  NOTE: The following applies primarily to controlled substances (Opioid* Pain Medications).   Patient's responsibilities: 1. Pain Pills: Bring all pain pills to every appointment (except for procedure appointments). 2. Pill Bottles: Bring pills in original pharmacy bottle. Always bring newest bottle. Bring bottle, even if empty. 3. Medication refills: You are responsible for knowing and keeping track of what medications you need refilled. The day before your appointment, write a list of all prescriptions that need to be refilled. Bring that list to your appointment and give it to the admitting nurse. Prescriptions will be written only during appointments. If you forget a medication, it will not be "Called in", "Faxed", or "electronically sent". You will need to get another appointment to get these prescribed. 4. Prescription Accuracy: You are responsible for carefully inspecting your prescriptions before leaving our office. Have the discharge nurse carefully go over each prescription with you, before taking them home. Make sure that your name is accurately spelled, that your address is correct. Check the name and dose of your medication to make sure it is accurate. Check the number of pills, and the  written instructions to make sure they are clear and accurate. Make sure that you are given enough medication to last until your next medication refill appointment. 5. Taking Medication: Take medication as prescribed. Never take more pills than instructed. Never take medication more frequently than prescribed. Taking less pills or less frequently is permitted and encouraged, when it comes to controlled substances (written prescriptions).  6. Inform other Doctors: Always inform, all of your healthcare providers, of all the medications you take. 7. Pain Medication from other Providers: You are not allowed to accept any additional pain medication from any other Doctor or Healthcare provider. There are two exceptions to this rule. (see below) In the event that you require additional pain medication, you are responsible for notifying us, as stated below. 8. Medication Agreement: You are responsible for carefully reading and following our Medication Agreement. This must be signed before receiving any prescriptions from our practice. Safely store a copy of your signed Agreement. Violations to the Agreement will result in no further prescriptions. (Additional copies of our Medication Agreement are available upon request.) 9. Laws, Rules, & Regulations: All patients are expected to follow all Federal and [employer], Rules, [employer]. Ignorance of the Laws does not constitute a valid excuse. The use of any illegal substances is prohibited. 10. Adopted CDC guidelines & recommendations: Target dosing levels will be at or below 60 MME/day. Use of benzodiazepines** is not recommended.  Exceptions: There are only two exceptions to the rule of not receiving pain medications from other Healthcare Providers. 1. Exception #1 (Emergencies): In the event of an emergency (i.e.: accident requiring emergency care), you are allowed to receive additional pain medication. However, you are responsible for: As soon as you  are able, call our office [phone], at any time of the day or night, and leave a message stating your  name, the date and nature of the emergency, and the name and dose of the medication prescribed. In the event that your call is answered by a member of our staff, make sure to document and save the date, time, and the name of the person that took your information.  2. Exception #2 (Planned Surgery): In the event that you are scheduled by another doctor or dentist to have any type of surgery or procedure, you are allowed (for a period no longer than 30 days), to receive additional pain medication, for the acute post-op pain. However, in this case, you are responsible for picking up a copy of our "Post-op Pain Management for Surgeons" handout, and giving it to your surgeon or dentist. This document is available at our office, and does not require an appointment to obtain it. Simply go to our office during business hours (Monday-Thursday from 8:00 AM to 4:00 PM) (Friday 8:00 AM to 12:00 Noon) or if you have a scheduled appointment with [place], prior to your surgery, and ask for it by name. In addition, you will need to provide [place] with your name, name of your surgeon, type of surgery, and date of procedure or surgery.  *Opioid medications include: morphine, codeine, oxycodone, oxymorphone, hydrocodone, hydromorphone, meperidine, tramadol, tapentadol, buprenorphine, fentanyl, methadone. **Benzodiazepine medications include: diazepam (Valium), alprazolam (Xanax), clonazepam (Klonopine), lorazepam (Ativan), clorazepate (Tranxene), chlordiazepoxide (Librium), estazolam (Prosom), oxazepam (Serax), temazepam (Restoril), triazolam (Halcion) (Last updated: 06/23/2017) ____________________________________________________________________________________________

## 2017-12-27 NOTE — Progress Notes (Signed)
Nursing Pain Medication Assessment:  Safety precautions to be maintained throughout the outpatient stay will include: orient to surroundings, keep bed in low position, maintain call bell within reach at all times, provide assistance with transfer out of bed and ambulation.  Medication Inspection Compliance: Pill count conducted under aseptic conditions, in front of the patient. Neither the pills nor the bottle was removed from the patient's sight at any time. Once count was completed pills were immediately returned to the patient in their original bottle.  Medication: Hydrocodone/APAP Pill/Patch Count: 3 of 120 pills remain Pill/Patch Appearance: Markings consistent with prescribed medication Bottle Appearance: Standard pharmacy container. Clearly labeled. Filled Date: 8 / 12 / 2019 Last Medication intake:  Today   Patient states that he took more after hospital R/T heart. Informed patient that they could call us and that we have paper that they will allow for acute pain

## 2017-12-27 NOTE — Progress Notes (Signed)
Patient's Name: Wesley Adams  MRN: [id number]  Referring Provider: Steele Sizer, MD  DOB: [birth year]  PCP: Steele Sizer, MD  DOS: 12/27/2017  Note by: Vevelyn Francois NP  Service setting: Ambulatory outpatient  Specialty: Interventional Pain Management  Location: [facility] (AMB) Pain Management Facility    Patient type: Established    Primary Reason(s) for Visit: Encounter for prescription drug management. (Level of risk: moderate)  CC: Back Pain (lower)  HPI  Wesley Adams is a 52 y.o. year old, male patient, who comes today for a medication management evaluation. He has Lumbar degenerative disc disease; Acute low back pain without sciatica; Spinal cord stimulator status; Chronic, continuous use of opioids; Tobacco use; Epigastric pain; Special screening for malignant neoplasms, colon; Chronic bilateral low back pain with bilateral sciatica; Chronic pain syndrome; Opiate use; Long term current use of opiate analgesic; Chest pain; Iliac artery stenosis, left ([place]); Atherosclerosis of aorta ([place]); and Coronary artery disease involving native heart on their problem list. His primarily concern today is the Back Pain (lower)  Pain Assessment: Location: Lower Back Radiating: down back of leg to knee Onset: More than a month ago(States medication is making him itch) Duration: Chronic pain Quality: Aching, Burning, Pins and needles Severity:  9/10 (subjective, self-reported pain score)  Note: Reported level is compatible with observation. Clinically the patient looks like a 2/10 A 2/10 is viewed as "Mild to Moderate" and described as noticeable and distracting. Impossible to hide from other people. More frequent flare-ups. Still possible to adapt and function close to normal. It can be very annoying and may have occasional stronger flare-ups. With discipline, patients may get used to it and adapt. Information on the proper use of the pain scale provided to the patient today. When using our objective  Pain Scale, levels between 6 and 10/10 are said to belong in an emergency room, as it progressively worsens from a 6/10, described as severely limiting, requiring emergency care not usually available at an outpatient pain management facility. At a 6/10 level, communication becomes difficult and requires great effort. Assistance to reach the emergency department may be required. Facial flushing and profuse sweating along with potentially dangerous increases in heart rate and blood pressure will be evident. Effect on ADL: difficulty sleeping, walking, exercising, patient hard to exercise since heart issues Timing:   Modifying factors: medications, rest, heat BP: (!) 158/85  HR: 69  Wesley Adams was last scheduled for an appointment on 11/29/2017 for medication management. During today's appointment we reviewed Wesley Adams chronic pain status, as well as his outpatient medication regimen. He admits that he is having increased pain. He has been evaluated of hematuria and chest pain. He admits that he was given Norco but the pharmacy informed him that this could not be filled so he has over taken his current Norco.  The patient  reports that he does not use drugs. His body mass index is 27.71 kg/m.  Further details on both, my assessment(s), as well as the proposed treatment plan, please see below.  Controlled Substance Pharmacotherapy Assessment REMS (Risk Evaluation and Mitigation Strategy)  Analgesic:Hydrocodone /APAP 10/33m QID MME/day:474mday.   GaIgnatius SpeckingRN  12/27/2017  2:28 PM  Sign at close encounter Nursing Pain Medication Assessment:  Safety precautions to be maintained throughout the outpatient stay will include: orient to surroundings, keep bed in low position, maintain call bell within reach at all times, provide assistance with transfer out of bed and ambulation.  Medication Inspection Compliance: Pill count  conducted under aseptic conditions, in front of the patient.  Neither the pills nor the bottle was removed from the patient's sight at any time. Once count was completed pills were immediately returned to the patient in their original bottle.  Medication: Hydrocodone/APAP Pill/Patch Count: 3 of 120 pills remain Pill/Patch Appearance: Markings consistent with prescribed medication Bottle Appearance: Standard pharmacy container. Clearly labeled. Filled Date: 8 / 12 / 2019 Last Medication intake:  Today   Patient states that he took more after hospital R/T heart. Informed patient that they could call us and that we have paper that they will allow for acute pain   Pharmacokinetics: Liberation and absorption (onset of action): WNL Distribution (time to peak effect): WNL Metabolism and excretion (duration of action): WNL         Pharmacodynamics: Desired effects: Analgesia: Wesley Adams reports >50% benefit. Functional ability: Patient reports that medication allows him to accomplish basic ADLs Clinically meaningful improvement in function (CMIF): Sustained CMIF goals met Perceived effectiveness: Described as relatively effective, allowing for increase in activities of daily living (ADL) Undesirable effects: Side-effects or Adverse reactions: Minimal itching Monitoring: [place] PMP: Online review of the past 63-monthperiod conducted. Compliant with practice rules and regulations Last UDS on record: Summary  Date Value Ref Range Status  11/29/2017 FINAL  Final    Comment:    ==================================================================== TOXASSURE SELECT 13 (MW) ==================================================================== Test                             Result       Flag       Units Drug Present and Declared for Prescription Verification   Hydrocodone                    860          EXPECTED   ng/mg creat   Hydromorphone                  199          EXPECTED   ng/mg creat   Dihydrocodeine                 87           EXPECTED   ng/mg  creat   Norhydrocodone                 >2841        EXPECTED   ng/mg creat    Sources of hydrocodone include scheduled prescription    medications. Hydromorphone, dihydrocodeine and norhydrocodone are    expected metabolites of hydrocodone. Hydromorphone and    dihydrocodeine are also available as scheduled prescription    medications. ==================================================================== Test                      Result    Flag   Units      Ref Range   Creatinine              176              mg/dL      >=20 ==================================================================== Declared Medications:  The flagging and interpretation on this report are based on the  following declared medications.  Unexpected results may arise from  inaccuracies in the declared medications.  **Note: The testing scope of this panel includes these medications:  Hydrocodone (Hydrocodone-Acetaminophen)  **Note: The testing scope of this panel does not  include following  reported medications:  Acetaminophen (Hydrocodone-Acetaminophen)  Tizanidine ==================================================================== For clinical consultation, please call [phone]. ====================================================================    UDS interpretation: Compliant          Medication Assessment Form: Reviewed. Patient indicates being compliant with therapy Treatment compliance: Compliant Risk Assessment Profile: Aberrant behavior: See prior evaluations. None observed or detected today Comorbid factors increasing risk of overdose: See prior notes. No additional risks detected today Opioid risk tool (ORT) (Total Score):   Personal History of Substance Abuse (SUD-Substance use disorder):  Alcohol:    Illegal Drugs:    Rx Drugs:    ORT Risk Level calculation:   Risk of substance use disorder (SUD): Low  ORT Scoring interpretation table:  Score <3 = Low Risk for SUD  Score between 4-7  = Moderate Risk for SUD  Score >8 = High Risk for Opioid Abuse   Risk Mitigation Strategies:  Patient Counseling: Covered Patient-Prescriber Agreement (PPA): Present and active  Notification to other healthcare providers: Done  Pharmacologic Plan: No change in therapy, at this time.             Laboratory Chemistry  Inflammation Markers (CRP: Acute Phase) (ESR: Chronic Phase) No results found for: CRP, ESRSEDRATE, LATICACIDVEN                       Rheumatology Markers No results found for: RF, ANA, LABURIC, URICUR, LYMEIGGIGMAB, LYMEABIGMQN, HLAB27                      Renal Function Markers Lab Results  Component Value Date   BUN 15 12/12/2017   CREATININE 0.83 01/75/1025   BCR NOT APPLICABLE 85/27/7824   GFRAA 117 12/12/2017   GFRNONAA 101 12/12/2017                             Hepatic Function Markers Lab Results  Component Value Date   AST 24 12/06/2017   ALT 21 12/06/2017   ALBUMIN 4.4 12/06/2017   ALKPHOS 64 12/06/2017   LIPASE 41 12/06/2017                        Electrolytes Lab Results  Component Value Date   NA 138 12/12/2017   K 4.7 12/12/2017   CL 102 12/12/2017   CALCIUM 9.7 12/12/2017                        Neuropathy Markers Lab Results  Component Value Date   HIV Non Reactive 12/06/2017                        Bone Pathology Markers No results found for: [place], [id number], [id number], [id number], 25OHVITD1, 25OHVITD2, 25OHVITD3, TESTOFREE, TESTOSTERONE                       Coagulation Parameters Lab Results  Component Value Date   INR 0.95 12/06/2017   LABPROT 12.6 12/06/2017   APTT 29 12/06/2017   PLT 204 12/12/2017                        Cardiovascular Markers Lab Results  Component Value Date   BNP 13.0 12/06/2017   TROPONINI 0.03 (HH) 12/07/2017   HGB 15.0 12/12/2017   HCT 44.3 12/12/2017  CA Markers No results found for: CEA, CA125, LABCA2                      Note: Lab results  reviewed.  Recent Diagnostic Imaging Results  CARDIAC CATHETERIZATION  Ost LM to Dist LM lesion is 15% stenosed.  Dist LM to Prox LAD lesion is 20% stenosed.  Ost 2nd Mrg to 2nd Mrg lesion is 35% stenosed.  Ost Cx to Prox Cx lesion is 10% stenosed.   Assessment The patient has had progressive canadian class 4 anginal symptoms with a  normal stress test with risk factors including high blood pressure, high  cholesterol and smoking.  normal left ventricular function with ejection fraction of 60%  mild 2 vessel coronary artery disease   There is not significant stenosis of all coronaries  Plan Continue medical management of CAD risk factors, Additional medications  for management of angina and No further cardiac intervention at this time  Recommend Aspirin 52m daily for moderate CAD. NM Myocar Multi W/Spect W/Wall Motion / EF  The study is normal.  This is a low risk study.  The left ventricular ejection fraction is normal (55-65%).  Blood pressure demonstrated a normal response to exercise.  There was no ST segment deviation noted during stress.    Complexity Note: Imaging results reviewed. Results shared with Mr. JReidel using Layman's terms.                         Meds   Current Outpatient Medications:  .  acetaminophen (TYLENOL) 325 MG tablet, Take 2 tablets (650 mg total) by mouth every 6 (six) hours as needed for mild pain (or Fever >/= 101)., Disp: , Rfl:  .  aspirin EC 81 MG EC tablet, Take 1 tablet (81 mg total) by mouth daily., Disp: , Rfl:  .  nicotine (NICODERM CQ - DOSED IN MG/24 HOURS) 21 mg/24hr patch, Place 1 patch (21 mg total) onto the skin daily., Disp: 28 patch, Rfl: 0 .  nitroGLYCERIN (NITROSTAT) 0.4 MG SL tablet, Place 0.4 mg under the tongue every 5 (five) minutes as needed for chest pain., Disp: , Rfl:  .  pantoprazole (PROTONIX) 40 MG tablet, Take 1 tablet (40 mg total) by mouth daily., Disp: 30 tablet, Rfl: 11 .  [START ON 01/04/2018]  tiZANidine (ZANAFLEX) 4 MG tablet, Take 1 tablet (4 mg total) by mouth every 8 (eight) hours as needed for muscle spasms., Disp: 90 tablet, Rfl: 1 .  [START ON 01/04/2018] HYDROcodone-acetaminophen (NORCO) 10-325 MG tablet, Take 1 tablet by mouth every 6 (six) hours as needed for severe pain., Disp: 120 tablet, Rfl: 0  ROS  Constitutional: Denies any fever or chills Gastrointestinal: No reported hemesis, hematochezia, vomiting, or acute GI distress Musculoskeletal: Denies any acute onset joint swelling, redness, loss of ROM, or weakness Neurological: No reported episodes of acute onset apraxia, aphasia, dysarthria, agnosia, amnesia, paralysis, loss of coordination, or loss of consciousness  Allergies  Mr. JSheridanhas No Known Allergies.  [place] Drug: Mr. JPursell reports that he does not use drugs. Alcohol:  reports that he drank alcohol. Tobacco:  reports that he has been smoking cigarettes. He has been smoking about 0.00 packs per day for the past 30.00 years. He has never used smokeless tobacco. Medical:  has a past medical history of Chronic back pain. Surgical: Mr. JPolinski has a past surgical history that includes Back surgery (Feb 2016); Spinal cord stimulator  insertion (03/23/2017); Colonoscopy with propofol (N/A, 06/01/2017); Esophagogastroduodenoscopy (egd) with propofol (06/01/2017); polypectomy (06/01/2017); Spinal cord stimulator removal (N/A, 06/16/2017); Fracture surgery (Right, as child); and LEFT HEART CATH AND CORONARY ANGIOGRAPHY (N/A, 12/08/2017). Family: family history includes Alcohol abuse in his mother; Heart attack in his father and paternal grandfather; Heart disease in his father; Obesity in his father.  Constitutional Exam  General appearance: Well nourished, well developed, and well hydrated. In no apparent acute distress Vitals:   12/27/17 1412  BP: (!) 158/85  Pulse: 69  Resp: (!) 71  Temp: 98 F (36.7 C)  SpO2: 100%  Weight: 210 lb (95.3 kg)  Height: 6' 1"   (1.854 m)  Psych/Mental status: Alert, oriented x 3 (person, place, & time)       Eyes: PERLA Respiratory: No evidence of acute respiratory distress  Lumbar Spine Area Exam  Skin & Axial Inspection: Well healed scar from previous spine surgery detected Alignment: Symmetrical Functional ROM: Unrestricted ROM       Stability: No instability detected Muscle Tone/Strength: Functionally intact. No obvious neuro-muscular anomalies detected. Sensory (Neurological): Unimpaired Palpation: Tender       Provocative Tests: Hyperextension/rotation test: deferred today       Lumbar quadrant test (Kemp's test): deferred today       Lateral bending test: deferred today       Patrick's Maneuver: deferred today                   FABER test: deferred today                   S-I anterior distraction/compression test: deferred today         S-I lateral compression test: deferred today         S-I Thigh-thrust test: deferred today         S-I Gaenslen's test: deferred today          Gait & Posture Assessment  Ambulation: Unassisted Gait: Relatively normal for age and body habitus Posture: WNL   Lower Extremity Exam    Side: Right lower extremity  Side: Left lower extremity  Stability: No instability observed          Stability: No instability observed          Skin & Extremity Inspection: Skin color, temperature, and hair growth are WNL. No peripheral edema or cyanosis. No masses, redness, swelling, asymmetry, or associated skin lesions. No contractures.  Skin & Extremity Inspection: Skin color, temperature, and hair growth are WNL. No peripheral edema or cyanosis. No masses, redness, swelling, asymmetry, or associated skin lesions. No contractures.  Functional ROM: Unrestricted ROM                  Functional ROM: Unrestricted ROM                  Muscle Tone/Strength: Functionally intact. No obvious neuro-muscular anomalies detected.  Muscle Tone/Strength: Functionally intact. No obvious  neuro-muscular anomalies detected.  Sensory (Neurological): Unimpaired  Sensory (Neurological): Unimpaired  Palpation: No palpable anomalies  Palpation: No palpable anomalies   Assessment  Primary Diagnosis & Pertinent Problem List: The primary encounter diagnosis was Chronic bilateral low back pain with bilateral sciatica. Diagnoses of Lumbar degenerative disc disease and Chronic pain syndrome were also pertinent to this visit.  Status Diagnosis  Persistent Persistent Controlled 1. Chronic bilateral low back pain with bilateral sciatica   2. Lumbar degenerative disc disease   3. Chronic pain syndrome  Problems updated and reviewed during this visit: No problems updated. Plan of Care  Pharmacotherapy (Medications Ordered): Meds ordered this encounter  Medications  . tiZANidine (ZANAFLEX) 4 MG tablet    Sig: Take 1 tablet (4 mg total) by mouth every 8 (eight) hours as needed for muscle spasms.    Dispense:  90 tablet    Refill:  1    Order Specific Question:   Supervising Provider    Answer:   Milinda Pointer [phone]  . HYDROcodone-acetaminophen (NORCO) 10-325 MG tablet    Sig: Take 1 tablet by mouth every 6 (six) hours as needed for severe pain.    Dispense:  120 tablet    Refill:  0    Do not place this medication on "Automatic Refill". Patient may have prescription filled one day early if pharmacy is closed on scheduled refill date. Do not fill until: 01/04/2018 To last until:02/03/2018    Order Specific Question:   Supervising Provider    Answer:   Milinda Pointer [phone]   New Prescriptions   No medications on file   Medications administered today: Wesley Adams had no medications administered during this visit. Lab-work, procedure(s), and/or referral(s): No orders of the defined types were placed in this encounter.  Imaging and/or referral(s): None   Provider-requested follow-up: Return in about 4 weeks (around 01/24/2018) for MedMgmt with Me Donella Stade  Edison Pace).  Future Appointments  Date Time Provider [place]  01/02/2018  1:30 PM Nori Riis, PA-C [facility] None  01/24/2018  1:45 PM Vevelyn Francois, NP [facility] None   Primary Care Physician: Steele Sizer, MD Location: [facility] Note by: Vevelyn Francois NP Date: 12/27/2017; Time: 4:02 PM  Pain Score Disclaimer: We use the NRS-11 scale. This is a self-reported, subjective measurement of pain severity with only modest accuracy. It is used primarily to identify changes within a particular patient. It must be understood that outpatient pain scales are significantly less accurate that those used for research, where they can be applied under ideal controlled circumstances with minimal exposure to variables. In reality, the score is likely to be a combination of pain intensity and pain affect, where pain affect describes the degree of emotional arousal or changes in action readiness caused by the sensory experience of pain. Factors such as social and work situation, setting, emotional state, anxiety levels, expectation, and prior pain experience may influence pain perception and show large inter-individual differences that may also be affected by time variables.  Patient instructions provided during this appointment: Patient Instructions  Rx for hydrocodone with acetaminophen and zanaflex to last until 02/03/2018 has been escribed to your pharmacy.____________________________________________________________________________________________  Medication Rules  Applies to: All patients receiving prescriptions (written or electronic).  Pharmacy of record: Pharmacy where electronic prescriptions will be sent. If written prescriptions are taken to a different pharmacy, please inform the nursing staff. The pharmacy listed in the electronic medical record should be the one where you would like electronic prescriptions to be sent.  Prescription refills: Only during  scheduled appointments. Applies to both, written and electronic prescriptions.  NOTE: The following applies primarily to controlled substances (Opioid* Pain Medications).   Patient's responsibilities: 1. Pain Pills: Bring all pain pills to every appointment (except for procedure appointments). 2. Pill Bottles: Bring pills in original pharmacy bottle. Always bring newest bottle. Bring bottle, even if empty. 3. Medication refills: You are responsible for knowing and keeping track of what medications you need refilled. The day before your appointment,  write a list of all prescriptions that need to be refilled. Bring that list to your appointment and give it to the admitting nurse. Prescriptions will be written only during appointments. If you forget a medication, it will not be "Called in", "Faxed", or "electronically sent". You will need to get another appointment to get these prescribed. 4. Prescription Accuracy: You are responsible for carefully inspecting your prescriptions before leaving our office. Have the discharge nurse carefully go over each prescription with you, before taking them home. Make sure that your name is accurately spelled, that your address is correct. Check the name and dose of your medication to make sure it is accurate. Check the number of pills, and the written instructions to make sure they are clear and accurate. Make sure that you are given enough medication to last until your next medication refill appointment. 5. Taking Medication: Take medication as prescribed. Never take more pills than instructed. Never take medication more frequently than prescribed. Taking less pills or less frequently is permitted and encouraged, when it comes to controlled substances (written prescriptions).  6. Inform other Doctors: Always inform, all of your healthcare providers, of all the medications you take. 7. Pain Medication from other Providers: You are not allowed to accept any additional pain  medication from any other Doctor or Healthcare provider. There are two exceptions to this rule. (see below) In the event that you require additional pain medication, you are responsible for notifying us, as stated below. 8. Medication Agreement: You are responsible for carefully reading and following our Medication Agreement. This must be signed before receiving any prescriptions from our practice. Safely store a copy of your signed Agreement. Violations to the Agreement will result in no further prescriptions. (Additional copies of our Medication Agreement are available upon request.) 9. Laws, Rules, & Regulations: All patients are expected to follow all Federal and [employer], Rules, [employer]. Ignorance of the Laws does not constitute a valid excuse. The use of any illegal substances is prohibited. 10. Adopted CDC guidelines & recommendations: Target dosing levels will be at or below 60 MME/day. Use of benzodiazepines** is not recommended.  Exceptions: There are only two exceptions to the rule of not receiving pain medications from other Healthcare Providers. 1. Exception #1 (Emergencies): In the event of an emergency (i.e.: accident requiring emergency care), you are allowed to receive additional pain medication. However, you are responsible for: As soon as you are able, call our office [phone], at any time of the day or night, and leave a message stating your name, the date and nature of the emergency, and the name and dose of the medication prescribed. In the event that your call is answered by a member of our staff, make sure to document and save the date, time, and the name of the person that took your information.  2. Exception #2 (Planned Surgery): In the event that you are scheduled by another doctor or dentist to have any type of surgery or procedure, you are allowed (for a period no longer than 30 days), to receive additional pain medication, for the acute post-op pain.  However, in this case, you are responsible for picking up a copy of our "Post-op Pain Management for Surgeons" handout, and giving it to your surgeon or dentist. This document is available at our office, and does not require an appointment to obtain it. Simply go to our office during business hours (Monday-Thursday from 8:00 AM to 4:00 PM) (Friday 8:00 AM to  12:00 Noon) or if you have a scheduled appointment with [place], prior to your surgery, and ask for it by name. In addition, you will need to provide [place] with your name, name of your surgeon, type of surgery, and date of procedure or surgery.  *Opioid medications include: morphine, codeine, oxycodone, oxymorphone, hydrocodone, hydromorphone, meperidine, tramadol, tapentadol, buprenorphine, fentanyl, methadone. **Benzodiazepine medications include: diazepam (Valium), alprazolam (Xanax), clonazepam (Klonopine), lorazepam (Ativan), clorazepate (Tranxene), chlordiazepoxide (Librium), estazolam (Prosom), oxazepam (Serax), temazepam (Restoril), triazolam (Halcion) (Last updated: 06/23/2017) ____________________________________________________________________________________________

## 2017-12-28 ENCOUNTER — Telehealth: Payer: Self-pay | Admitting: Nurse Practitioner

## 2017-12-28 NOTE — Telephone Encounter (Signed)
Patient has been in hospital for heart attack and was given 28 pills to help with increased pain. Wesley Adams says ok to fill this. Patients pharmacy will not fill and will not give script back. Please help patient.  [facility]. Please call patient asap and let him know.

## 2017-12-28 NOTE — Telephone Encounter (Signed)
DR Dustin Flock prescibed hydrocodone - apap 5-325 mg qty 28.  [facility] has approved this fill.  Spoke with patient and I will call pharmacy   Called patient to let him know that he can't fill the same medication within 30 days of each other.    Called patient to let him know this information and that he will need to contact Dr Serita Grit office.   His next fill date will be 01/04/18 for hydrocodone - apap 10-325 mg.

## 2018-01-02 ENCOUNTER — Encounter: Payer: Self-pay | Admitting: Urology

## 2018-01-02 ENCOUNTER — Ambulatory Visit: Payer: BLUE CROSS/BLUE SHIELD | Admitting: Urology

## 2018-01-02 VITALS — BP 127/77 | HR 74 | Ht 73.0 in | Wt 212.0 lb

## 2018-01-02 DIAGNOSIS — R310 Gross hematuria: Secondary | ICD-10-CM

## 2018-01-02 DIAGNOSIS — N138 Other obstructive and reflux uropathy: Secondary | ICD-10-CM | POA: Diagnosis not present

## 2018-01-02 DIAGNOSIS — N401 Enlarged prostate with lower urinary tract symptoms: Secondary | ICD-10-CM | POA: Diagnosis not present

## 2018-01-02 NOTE — Progress Notes (Signed)
01/02/2018 1:44 PM   Peri Jefferson 12-Jul-1965 [id number]  Referring provider: Steele Sizer, MD [address] [place], [place] [address]  Chief Complaint  Patient presents with  . Hematuria    HPI: Patient is a 52 -year-old [place] American who presents today as a referral from Dr. Steele Sizer their PCP for gross hematuria.    He was at the beach a [place] ago and had an episode of pain less gross hematuria.  He increased his fluids and the urine lightened.  He continues to have dark colored urine.    He does not have a prior history of recurrent urinary tract infections, nephrolithiasis, trauma to the genitourinary tract, BPH or malignancies of the genitourinary tract.   He does not have a family medical history of nephrolithiasis, malignancies of the genitourinary tract or hematuria.   Today, he is having symptoms of frequent urination, urgency and nocturia x 3-4.  This has been occurring for one year.  His UA is negative.    Patient denies any dysuria or suprapubic/flank pain.  Patient denies any fevers, chills, nausea or vomiting.    He is a smoker.  He has been smoking for thirty years.  He smokes 1 ppd.  He is not exposed to any chemicals.    PMH: Past Medical History:  Diagnosis Date  . Chronic back pain     Surgical History: Past Surgical History:  Procedure Laterality Date  . BACK SURGERY  Feb 2016   cut disc off of nerve with Dr Arnoldo Morale  . COLONOSCOPY WITH PROPOFOL N/A 06/01/2017   Procedure: COLONOSCOPY WITH PROPOFOL;  Surgeon: Lin Landsman, MD;  Location: [place];  Service: Endoscopy;  Laterality: N/A;  . ESOPHAGOGASTRODUODENOSCOPY (EGD) WITH PROPOFOL  06/01/2017   Procedure: ESOPHAGOGASTRODUODENOSCOPY (EGD) WITH PROPOFOL;  Surgeon: Lin Landsman, MD;  Location: [place];  Service: Endoscopy;;  . FRACTURE SURGERY Right as child   rod in right femur  . LEFT HEART CATH AND CORONARY ANGIOGRAPHY N/A  12/08/2017   Procedure: LEFT HEART CATH AND CORONARY ANGIOGRAPHY;  Surgeon: Corey Skains, MD;  Location: [place] [facility];  Service: Cardiovascular;  Laterality: N/A;  . POLYPECTOMY  06/01/2017   Procedure: POLYPECTOMY;  Surgeon: Lin Landsman, MD;  Location: [place];  Service: Endoscopy;;  . SPINAL CORD STIMULATOR INSERTION  03/23/2017   Dr Newman Pies, [facility] Neurosurgery  . SPINAL CORD STIMULATOR REMOVAL N/A 06/16/2017   Procedure: SPINAL CORD STIMULATOR REMOVAL;  Surgeon: Newman Pies, MD;  Location: [place];  Service: Neurosurgery;  Laterality: N/A;    Home Medications:  Allergies as of 01/02/2018   No Known Allergies     Medication List        Accurate as of 01/02/18  1:44 PM. Always use your most recent med list.          acetaminophen 325 MG tablet Commonly known as:  TYLENOL Take 2 tablets (650 mg total) by mouth every 6 (six) hours as needed for mild pain (or Fever >/= 101).   aspirin 81 MG EC tablet Take 1 tablet (81 mg total) by mouth daily.   HYDROcodone-acetaminophen 10-325 MG tablet Commonly known as:  NORCO Take 1 tablet by mouth every 6 (six) hours as needed for severe pain. Start taking on:  01/04/2018   nicotine 21 mg/24hr patch Commonly known as:  NICODERM CQ - dosed in mg/24 hours Place 1 patch (21 mg total) onto the skin daily.  nitroGLYCERIN 0.4 MG SL tablet Commonly known as:  NITROSTAT Place 0.4 mg under the tongue every 5 (five) minutes as needed for chest pain.   pantoprazole 40 MG tablet Commonly known as:  PROTONIX Take 1 tablet (40 mg total) by mouth daily.   tiZANidine 4 MG tablet Commonly known as:  ZANAFLEX Take 1 tablet (4 mg total) by mouth every 8 (eight) hours as needed for muscle spasms. Start taking on:  01/04/2018       Allergies: No Known Allergies  Family History: Family History  Problem Relation Age of Onset  . Alcohol abuse Mother   . Heart attack Father   . Heart disease Father   .  Obesity Father   . Heart attack Paternal Grandfather     Social History:  reports that he has been smoking cigarettes. He has been smoking about 0.00 packs per day for the past 30.00 years. He has never used smokeless tobacco. He reports that he drank alcohol. He reports that he does not use drugs.  ROS: UROLOGY Frequent Urination?: Yes Hard to postpone urination?: Yes Burning/pain with urination?: No Get up at night to urinate?: Yes Leakage of urine?: No Urine stream starts and stops?: No Trouble starting stream?: No Do you have to strain to urinate?: No Blood in urine?: Yes Urinary tract infection?: No Sexually transmitted disease?: No Injury to kidneys or bladder?: No Painful intercourse?: No Weak stream?: No Erection problems?: No Penile pain?: No  Gastrointestinal Nausea?: No Vomiting?: No Indigestion/heartburn?: No Diarrhea?: No Constipation?: No  Constitutional Fever: No Night sweats?: No Weight loss?: No Fatigue?: No  Skin Skin rash/lesions?: No Itching?: No  Eyes Blurred vision?: No Double vision?: No  Ears/Nose/Throat Sore throat?: No Sinus problems?: No  Hematologic/Lymphatic Swollen glands?: No Easy bruising?: No  Cardiovascular Leg swelling?: No Chest pain?: No  Respiratory Cough?: No Shortness of breath?: No  Endocrine Excessive thirst?: No  Musculoskeletal Back pain?: Yes Joint pain?: No  Neurological Headaches?: No Dizziness?: No  Psychologic Depression?: No Anxiety?: No  Physical Exam: BP 127/77   Pulse 74   Ht 6\' 1"  (1.854 m)   Wt 212 lb (96.2 kg)   BMI 27.97 kg/m   Constitutional:  Well nourished. Alert and oriented, No acute distress. HEENT: [place] AT, moist mucus membranes.  Trachea midline, no masses. Cardiovascular: No clubbing, cyanosis, or edema. Respiratory: Normal respiratory effort, no increased work of breathing. GI: Abdomen is soft, non tender, non distended, no abdominal masses. Liver and spleen not  palpable.  No hernias appreciated.  Stool sample for occult testing is not indicated.   GU: No CVA tenderness.  No bladder fullness or masses.  Patient with circumcised phallus.  Urethral meatus is patent.  No penile discharge. No penile lesions or rashes. Scrotum without lesions, cysts, rashes and/or edema.  Testicles are located scrotally bilaterally. No masses are appreciated in the testicles. Left and right epididymis are normal. Rectal: Patient with  normal sphincter tone. Anus and perineum without scarring or rashes. No rectal masses are appreciated. Prostate is approximately 45 grams, no nodules are appreciated. Seminal vesicles are normal. Skin: No rashes, bruises or suspicious lesions. Lymph: No cervical or inguinal adenopathy. Neurologic: Grossly intact, no focal deficits, moving all 4 extremities. Psychiatric: Normal mood and affect.  Laboratory Data: Lab Results  Component Value Date   WBC 6.7 12/12/2017   HGB 15.0 12/12/2017   HCT 44.3 12/12/2017   MCV 89.5 12/12/2017   PLT 204 12/12/2017    Lab Results  Component Value Date   CREATININE 0.83 12/12/2017    No results found for: PSA  No results found for: TESTOSTERONE  No results found for: HGBA1C  No results found for: TSH     Component Value Date/Time   CHOL 199 12/12/2017 0823   HDL 46 12/12/2017 0823   CHOLHDL 4.3 12/12/2017 0823   LDLCALC 133 (H) 12/12/2017 0823    Lab Results  Component Value Date   AST 24 12/06/2017   Lab Results  Component Value Date   ALT 21 12/06/2017   No components found for: ALKALINEPHOPHATASE No components found for: BILIRUBINTOTAL  No results found for: ESTRADIOL   Urinalysis Negative.  See Epic.    I have reviewed the labs.  Pertinent Imaging: CLINICAL DATA:  Epigastric and lower abdominal pain for 2 months.  EXAM: CT ABDOMEN AND PELVIS WITH CONTRAST  TECHNIQUE: Multidetector CT imaging of the abdomen and pelvis was performed using the standard protocol  following bolus administration of intravenous contrast.  CONTRAST:  149mL ISOVUE-300 IOPAMIDOL (ISOVUE-300) INJECTION 61%  COMPARISON:  None.  FINDINGS: Lower Chest: No acute findings.  Hepatobiliary: No hepatic masses identified. Tiny sub-cm cyst in left lobe. Gallbladder is unremarkable.  Pancreas:  No mass or inflammatory changes.  Spleen: Within normal limits in size and appearance.  Adrenals/Urinary Tract: No masses identified. Small right renal cyst noted. No evidence of hydronephrosis. Unremarkable unopacified urinary bladder.  Stomach/Bowel: No evidence of obstruction, inflammatory process or abnormal fluid collections. Normal appendix visualized.  Vascular/Lymphatic: No pathologically enlarged lymph nodes. No abdominal aortic aneurysm. Aortic atherosclerosis.  Reproductive:  No mass or other significant abnormality.  Other:  None.  Musculoskeletal: No suspicious bone lesions identified. Intramedullary rod noted in the right femur. Fusion hardware at L4-5 . Spinal cord stimulator device in left posterior abdominal wall subcutaneous tissues is unremarkable.  IMPRESSION: No acute findings within the abdomen or pelvis.  Aortic atherosclerosis.   Electronically Signed   By: Earle Gell M.D.   On: 04/29/2017 16:42  I have independently reviewed the films  Assessment & Plan:    1. Microcopic hematuria Explained to the patient that there are a number of causes that can be associated with blood in the urine, such as stones, BPH, UTI's, damage to the urinary tract and/or cancer. At this time, I felt that the patient warranted further urologic evaluation.   The AUA guidelines state that a CT urogram is the preferred imaging study to evaluate hematuria. I explained to the patient that a contrast material will be injected into a vein and that in rare instances, an allergic reaction can result and may even life threatening   The patient denies any  allergies to contrast, iodine and/or seafood and is not taking metformin Following the imaging study,  I've recommended a cystoscopy. I described how this is performed, typically in an office setting with a flexible cystoscope. We described the risks, benefits, and possible side effects, the most common of which is a minor amount of blood in the urine and/or burning which usually resolves in 24 to 48 hours.   The patient had the opportunity to ask questions which were answered. Based upon this discussion, the patient is willing to proceed. Therefore, I've ordered: a CT Urogram and cystoscopy.  The patient will return following all of the above for discussion of the results.  UA  Urine culture  BUN + creatinine    2. BPH with LU TS Will need to reassess once hematuria work-up is completed  if no etiology is found for his urgency   Return for CT Urogram report and cystoscopy.  These notes generated with voice recognition software. I apologize for typographical errors.  Zara Council, PA-C  [facility] [address] [place]  [place], [place] [address] [phone]

## 2018-01-03 LAB — BUN+CREAT
BUN/Creatinine Ratio: 15 (ref 9–20)
BUN: 12 mg/dL (ref 6–24)
CREATININE: 0.81 mg/dL (ref 0.76–1.27)
GFR, EST AFRICAN AMERICAN: 118 mL/min/{1.73_m2} (ref >59)
GFR, EST NON AFRICAN AMERICAN: 102 mL/min/{1.73_m2} (ref >59)

## 2018-01-03 LAB — URINALYSIS, COMPLETE
Bilirubin, UA: NEGATIVE
Glucose, UA: NEGATIVE
Ketones, UA: NEGATIVE
LEUKOCYTES UA: NEGATIVE
Nitrite, UA: NEGATIVE
PH UA: 5.5 (ref 5.0–7.5)
Protein, UA: NEGATIVE
SPEC GRAV UA: 1.02 (ref 1.005–1.030)
Urobilinogen, Ur: 0.2 mg/dL (ref 0.2–1.0)

## 2018-01-03 LAB — MICROSCOPIC EXAMINATION
Epithelial Cells (non renal): NONE SEEN /hpf (ref 0–10)
WBC, UA: NONE SEEN /hpf (ref 0–5)

## 2018-01-04 LAB — CULTURE, URINE COMPREHENSIVE

## 2018-01-12 ENCOUNTER — Encounter: Payer: Self-pay | Admitting: Family Medicine

## 2018-01-12 ENCOUNTER — Ambulatory Visit: Payer: BLUE CROSS/BLUE SHIELD | Admitting: Family Medicine

## 2018-01-12 VITALS — BP 148/90 | HR 89 | Temp 98.4°F | Resp 16 | Ht 73.0 in | Wt 210.5 lb

## 2018-01-12 DIAGNOSIS — J101 Influenza due to other identified influenza virus with other respiratory manifestations: Secondary | ICD-10-CM

## 2018-01-12 DIAGNOSIS — R6889 Other general symptoms and signs: Secondary | ICD-10-CM

## 2018-01-12 LAB — POCT INFLUENZA A/B
Influenza A, POC: POSITIVE — AB
Influenza B, POC: NEGATIVE

## 2018-01-12 MED ORDER — OSELTAMIVIR PHOSPHATE 75 MG PO CAPS: 75.0000 mg | ORAL_CAPSULE | Freq: Two times a day (BID) | ORAL | 0 refills | Status: AC

## 2018-01-12 MED ORDER — FLUTICASONE PROPIONATE 50 MCG/ACT NA SUSP: 2.0000 | Freq: Every day | NASAL | 1 refills | Status: DC

## 2018-01-12 MED ORDER — BENZONATATE 100 MG PO CAPS: 100.0000 mg | ORAL_CAPSULE | Freq: Three times a day (TID) | ORAL | 0 refills | Status: DC | PRN

## 2018-01-12 MED ORDER — SALINE SPRAY 0.65 % NA SOLN: 2.0000 | NASAL | 1 refills | Status: DC | PRN

## 2018-01-12 MED ORDER — GUAIFENESIN ER 600 MG PO TB12: 600.0000 mg | ORAL_TABLET | Freq: Two times a day (BID) | ORAL | 0 refills | Status: DC | PRN

## 2018-01-12 NOTE — Progress Notes (Signed)
Name: Wesley Adams   MRN: [id number]    DOB: July 03, 1965   Date:01/12/2018       Progress Note  Subjective  Chief Complaint  Chief Complaint  Patient presents with  . Fever    HPI  Pt presents with concern for fevers and chills, body aches, fatigue, nasal congestion, congested cough with chest burning that started yesterday morning. .  Denies shortness of breath, nausea, vomiting, or diarrhea, no chest pain. Has tried Alka-seltzer plus without relief.  Patient Active Problem List   Diagnosis Date Noted  . Coronary artery disease involving native heart 12/12/2017  . Chest pain 12/06/2017  . Iliac artery stenosis, left ([place]) 12/06/2017  . Atherosclerosis of aorta ([place]) 12/06/2017  . Long term current use of opiate analgesic 11/03/2017  . Chronic bilateral low back pain with bilateral sciatica 10/03/2017  . Chronic pain syndrome 10/03/2017  . Opiate use 10/03/2017  . Epigastric pain   . Special screening for malignant neoplasms, colon   . Chronic, continuous use of opioids 05/27/2017  . Tobacco use 05/27/2017  . Spinal cord stimulator status 04/29/2017  . Acute low back pain without sciatica 11/04/2015  . Lumbar degenerative disc disease 01/30/2015    Social History   Tobacco Use  . Smoking status: Current Every Day Smoker    Packs/day: 0.00    Years: 30.00    Pack years: 0.00    Types: Cigarettes  . Smokeless tobacco: Never Used  . Tobacco comment: down to 4 PPD  Substance Use Topics  . Alcohol use: Not Currently    Alcohol/week: 0.0 standard drinks    Comment: 12/05/17     Current Outpatient Medications:  .  acetaminophen (TYLENOL) 325 MG tablet, Take 2 tablets (650 mg total) by mouth every 6 (six) hours as needed for mild pain (or Fever >/= 101)., Disp: , Rfl:  .  aspirin EC 81 MG EC tablet, Take 1 tablet (81 mg total) by mouth daily., Disp: , Rfl:  .  HYDROcodone-acetaminophen (NORCO) 10-325 MG tablet, Take 1 tablet by mouth every 6 (six) hours as needed  for severe pain., Disp: 120 tablet, Rfl: 0 .  nicotine (NICODERM CQ - DOSED IN MG/24 HOURS) 21 mg/24hr patch, Place 1 patch (21 mg total) onto the skin daily., Disp: 28 patch, Rfl: 0 .  nitroGLYCERIN (NITROSTAT) 0.4 MG SL tablet, Place 0.4 mg under the tongue every 5 (five) minutes as needed for chest pain., Disp: , Rfl:  .  pantoprazole (PROTONIX) 40 MG tablet, Take 1 tablet (40 mg total) by mouth daily., Disp: 30 tablet, Rfl: 11 .  tiZANidine (ZANAFLEX) 4 MG tablet, Take 1 tablet (4 mg total) by mouth every 8 (eight) hours as needed for muscle spasms., Disp: 90 tablet, Rfl: 1 .  benzonatate (TESSALON) 100 MG capsule, Take 1 capsule (100 mg total) by mouth 3 (three) times daily as needed for cough., Disp: 20 capsule, Rfl: 0 .  fluticasone (FLONASE) 50 MCG/ACT nasal spray, Place 2 sprays into both nostrils daily., Disp: 16 g, Rfl: 1 .  guaiFENesin (MUCINEX) 600 MG 12 hr tablet, Take 1 tablet (600 mg total) by mouth 2 (two) times daily as needed for cough or to loosen phlegm., Disp: 20 tablet, Rfl: 0 .  oseltamivir (TAMIFLU) 75 MG capsule, Take 1 capsule (75 mg total) by mouth 2 (two) times daily for 5 days., Disp: 10 capsule, Rfl: 0 .  sodium chloride (OCEAN) 0.65 % SOLN nasal spray, Place 2 sprays into both nostrils as  needed for congestion., Disp: 50 mL, Rfl: 1  No Known Allergies  I personally reviewed active problem list, medication list, allergies, lab results with the patient/caregiver today.  ROS  Ten systems reviewed and is negative except as mentioned in HPI  Objective  Vitals:   01/12/18 0957  BP: (!) 148/90  Pulse: 89  Resp: 16  Temp: 98.4 F (36.9 C)  TempSrc: Oral  SpO2: 99%  Weight: 210 lb 8 oz (95.5 kg)  Height: 6\' 1"  (1.854 m)   Body mass index is 27.77 kg/m.  Nursing Note and Vital Signs reviewed.  Physical Exam  Constitutional: Patient appears well-developed and well-nourished.  No distress.  HEENT: head atraumatic, normocephalic, pupils equal and reactive  to light, Bilateral TM's without erythema or effusion,  bilateral maxillary and frontal sinuses are slightly tender, neck supple with mild submandibular lymphadenopathy, throat within normal limits - no erythema or exudate, no tonsillar swelling.  Nasal turbinates are inflamed.  Voice quality is nasal Cardiovascular: Normal rate, regular rhythm and normal heart sounds.  No murmur heard. No BLE edema. Pulmonary/Chest: Effort normal and breath sounds clear bilaterally. No respiratory distress. Abdominal: Soft, bowel sounds normal, there is no tenderness, no HSM Psychiatric: Patient has a normal mood and affect. behavior is normal. Judgment and thought content normal.  Results for orders placed or performed in visit on 01/12/18 (from the past 72 hour(s))  POCT Influenza A/B     Status: Abnormal   Collection Time: 01/12/18 10:12 AM  Result Value Ref Range   Influenza A, POC Positive (A) Negative   Influenza B, POC Negative Negative    Assessment & Plan  1. Influenza A - guaiFENesin (MUCINEX) 600 MG 12 hr tablet; Take 1 tablet (600 mg total) by mouth 2 (two) times daily as needed for cough or to loosen phlegm.  Dispense: 20 tablet; Refill: 0 - benzonatate (TESSALON) 100 MG capsule; Take 1 capsule (100 mg total) by mouth 3 (three) times daily as needed for cough.  Dispense: 20 capsule; Refill: 0 - fluticasone (FLONASE) 50 MCG/ACT nasal spray; Place 2 sprays into both nostrils daily.  Dispense: 16 g; Refill: 1 - sodium chloride (OCEAN) 0.65 % SOLN nasal spray; Place 2 sprays into both nostrils as needed for congestion.  Dispense: 50 mL; Refill: 1 - oseltamivir (TAMIFLU) 75 MG capsule; Take 1 capsule (75 mg total) by mouth 2 (two) times daily for 5 days.  Dispense: 10 capsule; Refill: 0  2. Flu-like symptoms - POCT Influenza A/B  -Red flags and when to present for emergency care or RTC including fever >101.56F, chest pain, shortness of breath, new/worsening/un-resolving symptoms, reviewed with  patient at time of visit. Follow up and care instructions discussed and provided in AVS.

## 2018-01-12 NOTE — Patient Instructions (Addendum)
Cool Mist Vaporizer A cool mist vaporizer is a device that releases a cool mist into the air. If you have a cough or a cold, using a vaporizer may help relieve your symptoms. The mist adds moisture to the air, which may help thin your mucus and make it less sticky. When your mucus is thin and less sticky, it easier for you to breathe and to cough up secretions. Do not use a vaporizer if you are allergic to mold. Follow these instructions at home:  Follow the instructions that come with the vaporizer.  Do not use anything other than distilled water in the vaporizer.  Do not run the vaporizer all of the time. Doing that can cause mold or bacteria to grow in the vaporizer.  Clean the vaporizer after each time that you use it.  Clean and dry the vaporizer well before storing it.  Stop using the vaporizer if your breathing symptoms get worse. This information is not intended to replace advice given to you by your health care provider. Make sure you discuss any questions you have with your health care provider. Document Released: 01/08/2004 Document Revised: 10/31/2015 Document Reviewed: 07/12/2015 Elsevier Interactive Patient Education  2018 [employer] Inc[employer]   Upper Respiratory Infection, Adult Most upper respiratory infections (URIs) are caused by a virus. A URI affects the nose, throat, and upper air passages. The most common type of URI is often called "the common cold." Follow these instructions at home:  Take medicines only as told by your doctor.  Gargle warm saltwater or take cough drops to comfort your throat as told by your doctor.  Use a warm mist humidifier or inhale steam from a shower to increase air moisture. This may make it easier to breathe.  Drink enough fluid to keep your pee (urine) clear or pale yellow.  Eat soups and other clear broths.  Have a healthy diet.  Rest as needed.  Go back to work when your fever is gone or your doctor says it is okay. ? You may need  to stay home longer to avoid giving your URI to others. ? You can also wear a face mask and wash your hands often to prevent spread of the virus.  Use your inhaler more if you have asthma.  Do not use any tobacco products, including cigarettes, chewing tobacco, or electronic cigarettes. If you need help quitting, ask your doctor. Contact a doctor if:  You are getting worse, not better.  Your symptoms are not helped by medicine.  You have chills.  You are getting more short of breath.  You have brown or red mucus.  You have yellow or brown discharge from your nose.  You have pain in your face, especially when you bend forward.  You have a fever.  You have puffy (swollen) neck glands.  You have pain while swallowing.  You have white areas in the back of your throat. Get help right away if:  You have very bad or constant: ? Headache. ? Ear pain. ? Pain in your forehead, behind your eyes, and over your cheekbones (sinus pain). ? Chest pain.  You have long-lasting (chronic) lung disease and any of the following: ? Wheezing. ? Long-lasting cough. ? Coughing up blood. ? A change in your usual mucus.  You have a stiff neck.  You have changes in your: ? Vision. ? Hearing. ? Thinking. ? Mood. This information is not intended to replace advice given to you by your health care provider. Make   sure you discuss any questions you have with your health care provider. Document Released: 09/29/2007 Document Revised: 12/14/2015 Document Reviewed: 07/18/2013 Elsevier Interactive Patient Education  2018 [employer].   Influenza, Adult Influenza ("the flu") is an infection in the lungs, nose, and throat (respiratory tract). It is caused by a virus. The flu causes many common cold symptoms, as well as a high fever and body aches. It can make you feel very sick. The flu spreads easily from person to person (is contagious). Getting a flu shot (influenza vaccination) every year is the  best way to prevent the flu. Follow these instructions at home:  Take over-the-counter and prescription medicines only as told by your doctor.  Use a cool mist humidifier to add moisture (humidity) to the air in your home. This can make it easier to breathe.  Rest as needed.  Drink enough fluid to keep your pee (urine) clear or pale yellow.  Cover your mouth and nose when you cough or sneeze.  Wash your hands with soap and water often, especially after you cough or sneeze. If you cannot use soap and water, use hand sanitizer.  Stay home from work or school as told by your doctor. Unless you are visiting your doctor, try to avoid leaving home until your fever has been gone for 24 hours without the use of medicine.  Keep all follow-up visits as told by your doctor. This is important. How is this prevented?  Getting a yearly (annual) flu shot is the best way to avoid getting the flu. You may get the flu shot in late summer, fall, or winter. Ask your doctor when you should get your flu shot.  Wash your hands often or use hand sanitizer often.  Avoid contact with people who are sick during cold and flu season.  Eat healthy foods.  Drink plenty of fluids.  Get enough sleep.  Exercise regularly. Contact a doctor if:  You get new symptoms.  You have: ? Chest pain. ? Watery poop (diarrhea). ? A fever.  Your cough gets worse.  You start to have more mucus.  You feel sick to your stomach (nauseous).  You throw up (vomit). Get help right away if:  You start to be short of breath or have trouble breathing.  Your skin or nails turn a bluish color.  You have very bad pain or stiffness in your neck.  You get a sudden headache.  You get sudden pain in your face or ear.  You cannot stop throwing up. This information is not intended to replace advice given to you by your health care provider. Make sure you discuss any questions you have with your health care  provider. Document Released: 01/20/2008 Document Revised: 09/18/2015 Document Reviewed: 02/04/2015 Elsevier Interactive Patient Education  2017 [employer].

## 2018-01-23 ENCOUNTER — Ambulatory Visit
Admission: RE | Admit: 2018-01-23 | Discharge: 2018-01-23 | Disposition: A | Discharge status: Home / Self Care | Payer: BLUE CROSS/BLUE SHIELD | Source: Ambulatory Visit | Attending: Urology | Admitting: Urology

## 2018-01-23 DIAGNOSIS — I700 Atherosclerosis of aorta: Secondary | ICD-10-CM | POA: Diagnosis not present

## 2018-01-23 DIAGNOSIS — N281 Cyst of kidney, acquired: Secondary | ICD-10-CM | POA: Insufficient documentation

## 2018-01-23 DIAGNOSIS — I2510 Atherosclerotic heart disease of native coronary artery without angina pectoris: Secondary | ICD-10-CM | POA: Insufficient documentation

## 2018-01-23 DIAGNOSIS — R310 Gross hematuria: Secondary | ICD-10-CM

## 2018-01-23 MED ORDER — IOPAMIDOL (ISOVUE-300) INJECTION 61%
125.0000 mL | Freq: Once | INTRAVENOUS | Status: AC | PRN
  Administered 2018-01-23: 125 mL via INTRAVENOUS

## 2018-01-24 ENCOUNTER — Ambulatory Visit: Payer: BLUE CROSS/BLUE SHIELD | Attending: Nurse Practitioner | Admitting: Nurse Practitioner

## 2018-01-24 ENCOUNTER — Encounter: Payer: Self-pay | Admitting: Nurse Practitioner

## 2018-01-24 ENCOUNTER — Other Ambulatory Visit: Payer: Self-pay

## 2018-01-24 ENCOUNTER — Ambulatory Visit: Payer: BLUE CROSS/BLUE SHIELD | Admitting: Urology

## 2018-01-24 VITALS — BP 150/80 | HR 58 | Ht 73.0 in | Wt 210.4 lb

## 2018-01-24 VITALS — BP 146/88 | HR 71 | Temp 97.6°F | Resp 16 | Ht 73.0 in | Wt 210.0 lb

## 2018-01-24 DIAGNOSIS — R310 Gross hematuria: Secondary | ICD-10-CM

## 2018-01-24 DIAGNOSIS — G8929 Other chronic pain: Secondary | ICD-10-CM

## 2018-01-24 DIAGNOSIS — Z79891 Long term (current) use of opiate analgesic: Secondary | ICD-10-CM | POA: Diagnosis not present

## 2018-01-24 DIAGNOSIS — M5442 Lumbago with sciatica, left side: Secondary | ICD-10-CM

## 2018-01-24 DIAGNOSIS — M5136 Other intervertebral disc degeneration, lumbar region: Secondary | ICD-10-CM | POA: Diagnosis not present

## 2018-01-24 DIAGNOSIS — G894 Chronic pain syndrome: Secondary | ICD-10-CM

## 2018-01-24 DIAGNOSIS — M5441 Lumbago with sciatica, right side: Secondary | ICD-10-CM

## 2018-01-24 LAB — URINALYSIS, COMPLETE
BILIRUBIN UA: NEGATIVE
Glucose, UA: NEGATIVE
Ketones, UA: NEGATIVE
Leukocytes, UA: NEGATIVE
Nitrite, UA: NEGATIVE
PH UA: 6 (ref 5.0–7.5)
Protein, UA: NEGATIVE
Specific Gravity, UA: <1.005 — ABNORMAL LOW (ref 1.005–1.030)
UUROB: 0.2 mg/dL (ref 0.2–1.0)

## 2018-01-24 LAB — MICROSCOPIC EXAMINATION
EPITHELIAL CELLS (NON RENAL): NONE SEEN /HPF (ref 0–10)
WBC UA: NONE SEEN /HPF (ref 0–5)

## 2018-01-24 MED ORDER — TIZANIDINE HCL 4 MG PO TABS: 4.0000 mg | ORAL_TABLET | Freq: Three times a day (TID) | ORAL | 0 refills | Status: DC | PRN

## 2018-01-24 MED ORDER — HYDROCODONE-ACETAMINOPHEN 10-325 MG PO TABS: 1.0000 | ORAL_TABLET | Freq: Four times a day (QID) | ORAL | 0 refills | Status: DC | PRN

## 2018-01-24 NOTE — Progress Notes (Signed)
Nursing Pain Medication Assessment:  Safety precautions to be maintained throughout the outpatient stay will include: orient to surroundings, keep bed in low position, maintain call bell within reach at all times, provide assistance with transfer out of bed and ambulation.  Medication Inspection Compliance: Wesley Adams did not comply with our request to bring his pills to be counted. He was reminded that bringing the medication bottles, even when empty, is a requirement.  Medication: None brought in. Pill/Patch Count: None available to be counted. Bottle Appearance: No container available. Did not bring bottle(s) to appointment. Filled Date: N/A Last Medication intake:  Yesterday   Pt very anxious, has doctor appt as soon as he leaves here. (Possible prostate cancer). "I forgot pills"  "I have 18 left"

## 2018-01-24 NOTE — Patient Instructions (Addendum)
____________________________________________________________________________________________  Medication Rules  Applies to: All patients receiving prescriptions (written or electronic).  Pharmacy of record: Pharmacy where electronic prescriptions will be sent. If written prescriptions are taken to a different pharmacy, please inform the nursing staff. The pharmacy listed in the electronic medical record should be the one where you would like electronic prescriptions to be sent.  Prescription refills: Only during scheduled appointments. Applies to both, written and electronic prescriptions.  NOTE: The following applies primarily to controlled substances (Opioid* Pain Medications).   Patient's responsibilities: 1. Pain Pills: Bring all pain pills to every appointment (except for procedure appointments). 2. Pill Bottles: Bring pills in original pharmacy bottle. Always bring newest bottle. Bring bottle, even if empty. 3. Medication refills: You are responsible for knowing and keeping track of what medications you need refilled. The day before your appointment, write a list of all prescriptions that need to be refilled. Bring that list to your appointment and give it to the admitting nurse. Prescriptions will be written only during appointments. If you forget a medication, it will not be "Called in", "Faxed", or "electronically sent". You will need to get another appointment to get these prescribed. 4. Prescription Accuracy: You are responsible for carefully inspecting your prescriptions before leaving our office. Have the discharge nurse carefully go over each prescription with you, before taking them home. Make sure that your name is accurately spelled, that your address is correct. Check the name and dose of your medication to make sure it is accurate. Check the number of pills, and the written instructions to make sure they are clear and accurate. Make sure that you are given enough medication to last  until your next medication refill appointment. 5. Taking Medication: Take medication as prescribed. Never take more pills than instructed. Never take medication more frequently than prescribed. Taking less pills or less frequently is permitted and encouraged, when it comes to controlled substances (written prescriptions).  6. Inform other Doctors: Always inform, all of your healthcare providers, of all the medications you take. 7. Pain Medication from other Providers: You are not allowed to accept any additional pain medication from any other Doctor or Healthcare provider. There are two exceptions to this rule. (see below) In the event that you require additional pain medication, you are responsible for notifying us, as stated below. 8. Medication Agreement: You are responsible for carefully reading and following our Medication Agreement. This must be signed before receiving any prescriptions from our practice. Safely store a copy of your signed Agreement. Violations to the Agreement will result in no further prescriptions. (Additional copies of our Medication Agreement are available upon request.) 9. Laws, Rules, & Regulations: All patients are expected to follow all Federal and State Laws, Statutes, Rules, & Regulations. Ignorance of the Laws does not constitute a valid excuse. The use of any illegal substances is prohibited. 10. Adopted CDC guidelines & recommendations: Target dosing levels will be at or below 60 MME/day. Use of benzodiazepines** is not recommended.  Exceptions: There are only two exceptions to the rule of not receiving pain medications from other Healthcare Providers. 1. Exception #1 (Emergencies): In the event of an emergency (i.e.: accident requiring emergency care), you are allowed to receive additional pain medication. However, you are responsible for: As soon as you are able, call our office [phone], at any time of the day or night, and leave a message stating your name, the  date and nature of the emergency, and the name and dose of the medication   prescribed. In the event that your call is answered by a member of our staff, make sure to document and save the date, time, and the name of the person that took your information.  2. Exception #2 (Planned Surgery): In the event that you are scheduled by another doctor or dentist to have any type of surgery or procedure, you are allowed (for a period no longer than 30 days), to receive additional pain medication, for the acute post-op pain. However, in this case, you are responsible for picking up a copy of our "Post-op Pain Management for Surgeons" handout, and giving it to your surgeon or dentist. This document is available at our office, and does not require an appointment to obtain it. Simply go to our office during business hours (Monday-Thursday from 8:00 AM to 4:00 PM) (Friday 8:00 AM to 12:00 Noon) or if you have a scheduled appointment with [place], prior to your surgery, and ask for it by name. In addition, you will need to provide [place] with your name, name of your surgeon, type of surgery, and date of procedure or surgery.  *Opioid medications include: morphine, codeine, oxycodone, oxymorphone, hydrocodone, hydromorphone, meperidine, tramadol, tapentadol, buprenorphine, fentanyl, methadone. **Benzodiazepine medications include: diazepam (Valium), alprazolam (Xanax), clonazepam (Klonopine), lorazepam (Ativan), clorazepate (Tranxene), chlordiazepoxide (Librium), estazolam (Prosom), oxazepam (Serax), temazepam (Restoril), triazolam (Halcion) (Last updated: 06/23/2017) ____________________________________________________________________________________________  Please bring your medication with you next appointment. If the bottle is empty please bring anyway. If not you will not get prescription until you bring bottle for [place] to count.   Hope everything turns out OK for you. Wesley Adams!!

## 2018-01-24 NOTE — Progress Notes (Signed)
Patient's Name: Wesley Adams  MRN: [id number]  Referring Provider: Steele Sizer, MD  DOB: 09-03-1965  PCP: Steele Sizer, MD  DOS: 01/24/2018  Note by: Vevelyn Francois NP  Service setting: Ambulatory outpatient  Specialty: Interventional Pain Management  Location: [facility] (AMB) Pain Management Facility    Patient type: Established    Primary Reason(s) for Visit: Encounter for prescription drug management. (Level of risk: moderate)  CC: Back Pain (lower)  HPI  Wesley Adams is a 52 y.o. year old, male patient, who comes today for a medication management evaluation. He has Lumbar degenerative disc disease; Acute low back pain without sciatica; Spinal cord stimulator status; Chronic, continuous use of opioids; Tobacco use; Epigastric pain; Special screening for malignant neoplasms, colon; Chronic bilateral low back pain with bilateral sciatica; Chronic pain syndrome; Opiate use; Long term current use of opiate analgesic; Chest pain; Iliac artery stenosis, left ([place]); Atherosclerosis of aorta ([place]); and Coronary artery disease involving native heart on their problem list. His primarily concern today is the Back Pain (lower)  Pain Assessment: Location: Lower Back Radiating: down back of legs to knee, lower right side of back hurts worse Onset: More than a month ago Duration: Chronic pain Quality: Aching(pinching and catching on right side need) Severity: 7 /10 (subjective, self-reported pain score)  Note: Reported level is compatible with observation. Clinically the patient looks like a 2/10 A 2/10 is viewed as "Mild to Moderate" and described as noticeable and distracting. Impossible to hide from other people. More frequent flare-ups. Still possible to adapt and function close to normal. It can be very annoying and may have occasional stronger flare-ups. With discipline, patients may get used to it and adapt. Discrepancy may suggest a "suffering" (psychosocial) component. When using our objective  Pain Scale, levels between 6 and 10/10 are said to belong in an emergency room, as it progressively worsens from a 6/10, described as severely limiting, requiring emergency care not usually available at an outpatient pain management facility. At a 6/10 level, communication becomes difficult and requires great effort. Assistance to reach the emergency department may be required. Facial flushing and profuse sweating along with potentially dangerous increases in heart rate and blood pressure will be evident. Effect on ADL: difficulty sleepin, walking, exercisind, pt had flu 2 weeks ago. Timing:   Modifying factors: medications, rest, heat BP: (!) 146/88  HR: 71  Wesley Adams was last scheduled for an appointment on 12/28/2017 for medication management. During today's appointment we reviewed Wesley Adams chronic pain status, as well as his outpatient medication regimen.  The patient  reports that he does not use drugs. His body mass index is 27.71 kg/m.  Further details on both, my assessment(s), as well as the proposed treatment plan, please see below.  Controlled Substance Pharmacotherapy Assessment REMS (Risk Evaluation and Mitigation Strategy)  Analgesic:Hydrocodone /APAP 10/376m QID MME/day:450mday.   GaIgnatius SpeckingRN  01/24/2018  2:50 PM  Sign at close encounter Nursing Pain Medication Assessment:  Safety precautions to be maintained throughout the outpatient stay will include: orient to surroundings, keep bed in low position, maintain call bell within reach at all times, provide assistance with transfer out of bed and ambulation.  Medication Inspection Compliance: Mr. JeManganid not comply with our request to bring his pills to be counted. He was reminded that bringing the medication bottles, even when empty, is a requirement.  Medication: None brought in. Pill/Patch Count: None available to be counted. Bottle Appearance: No container available. Did not bring  bottle(s) to  appointment. Filled Date: N/A Last Medication intake:  Yesterday   Pt very anxious, has doctor appt as soon as he leaves here. (Possible prostate cancer). "I forgot pills"  "I have 18 left"   Pharmacokinetics: Liberation and absorption (onset of action): WNL Distribution (time to peak effect): WNL Metabolism and excretion (duration of action): WNL         Pharmacodynamics: Desired effects: Analgesia: Wesley Adams reports >50% benefit. Functional ability: Patient reports that medication allows him to accomplish basic ADLs Clinically meaningful improvement in function (CMIF): Sustained CMIF goals met Perceived effectiveness: Described as relatively effective, allowing for increase in activities of daily living (ADL) Undesirable effects: Side-effects or Adverse reactions: None reported Monitoring: [place] PMP: Online review of the past 57-monthperiod conducted. Compliant with practice rules and regulations Last UDS on record: Summary  Date Value Ref Range Status  11/29/2017 FINAL  Final    Comment:    ==================================================================== TOXASSURE SELECT 13 (MW) ==================================================================== Test                             Result       Flag       Units Drug Present and Declared for Prescription Verification   Hydrocodone                    860          EXPECTED   ng/mg creat   Hydromorphone                  199          EXPECTED   ng/mg creat   Dihydrocodeine                 87           EXPECTED   ng/mg creat   Norhydrocodone                 >2841        EXPECTED   ng/mg creat    Sources of hydrocodone include scheduled prescription    medications. Hydromorphone, dihydrocodeine and norhydrocodone are    expected metabolites of hydrocodone. Hydromorphone and    dihydrocodeine are also available as scheduled prescription    medications. ==================================================================== Test                       Result    Flag   Units      Ref Range   Creatinine              176              mg/dL      >=20 ==================================================================== Declared Medications:  The flagging and interpretation on this report are based on the  following declared medications.  Unexpected results may arise from  inaccuracies in the declared medications.  **Note: The testing scope of this panel includes these medications:  Hydrocodone (Hydrocodone-Acetaminophen)  **Note: The testing scope of this panel does not include following  reported medications:  Acetaminophen (Hydrocodone-Acetaminophen)  Tizanidine ==================================================================== For clinical consultation, please call ([phone] ====================================================================    UDS interpretation: Compliant          Medication Assessment Form: Reviewed. Patient indicates being compliant with therapy Treatment compliance: Non-compliant Risk Assessment Profile: Aberrant behavior: failure to bring medications for pill counts Comorbid factors increasing risk of overdose: See prior notes. No additional  risks detected today Opioid risk tool (ORT) (Total Score): 4 Personal History of Substance Abuse (SUD-Substance use disorder):  Alcohol: Negative  Illegal Drugs: Positive Male or Male  Rx Drugs: Negative  ORT Risk Level calculation: Moderate Risk Risk of substance use disorder (SUD): Moderate-to-High Opioid Risk Tool - 01/24/18 1416      Family History of Substance Abuse   Alcohol  Negative    Illegal Drugs  Negative    Rx Drugs  Negative      Personal History of Substance Abuse   Alcohol  Negative    Illegal Drugs  Positive Male or Male    Rx Drugs  Negative      History of Preadolescent Sexual Abuse   History of Preadolescent Sexual Abuse  Negative or Male      Psychological Disease   Psychological Disease  Negative    ADD   Negative    OCD  Negative    Bipolar  Negative    Schizophrenia  Negative    Depression  Negative      Total Score   Opioid Risk Tool Scoring  4    Opioid Risk Interpretation  Moderate Risk      ORT Scoring interpretation table:  Score <3 = Low Risk for SUD  Score between 4-7 = Moderate Risk for SUD  Score >8 = High Risk for Opioid Abuse   Risk Mitigation Strategies:  Patient Counseling: Covered Patient-Prescriber Agreement (PPA): Present and active  Notification to other healthcare providers: Done  Pharmacologic Plan: No change in therapy, at this time.             Laboratory Chemistry  Inflammation Markers (CRP: Acute Phase) (ESR: Chronic Phase) No results found for: CRP, ESRSEDRATE, LATICACIDVEN                       Rheumatology Markers No results found for: RF, ANA, LABURIC, URICUR, LYMEIGGIGMAB, LYMEABIGMQN, HLAB27                      Renal Function Markers Lab Results  Component Value Date   BUN 12 01/02/2018   CREATININE 0.81 01/02/2018   BCR 15 01/02/2018   GFRAA 118 01/02/2018   GFRNONAA 102 01/02/2018                             Hepatic Function Markers Lab Results  Component Value Date   AST 24 12/06/2017   ALT 21 12/06/2017   ALBUMIN 4.4 12/06/2017   ALKPHOS 64 12/06/2017   LIPASE 41 12/06/2017                        Electrolytes Lab Results  Component Value Date   NA 138 12/12/2017   K 4.7 12/12/2017   CL 102 12/12/2017   CALCIUM 9.7 12/12/2017                        Neuropathy Markers Lab Results  Component Value Date   HIV Non Reactive 12/06/2017                        CNS Tests No results found for: COLORCSF, APPEARCSF, RBCCOUNTCSF, WBCCSF, POLYSCSF, LYMPHSCSF, EOSCSF, PROTEINCSF, GLUCCSF, JCVIRUS, CSFOLI, IGGCSF  Bone Pathology Markers No results found for: Marveen Reeks, [id number], [id number], 25OHVITD1, 25OHVITD2, 25OHVITD3, TESTOFREE, TESTOSTERONE                       Coagulation Parameters Lab  Results  Component Value Date   INR 0.95 12/06/2017   LABPROT 12.6 12/06/2017   APTT 29 12/06/2017   PLT 204 12/12/2017                        Cardiovascular Markers Lab Results  Component Value Date   BNP 13.0 12/06/2017   TROPONINI 0.03 (HH) 12/07/2017   HGB 15.0 12/12/2017   HCT 44.3 12/12/2017                         CA Markers No results found for: CEA, CA125, LABCA2                      Note: Lab results reviewed.  Recent Diagnostic Imaging Results  CT HEMATURIA WORKUP CLINICAL DATA:  Episodes of gross hematuria.  EXAM: CT ABDOMEN AND PELVIS WITHOUT AND WITH CONTRAST  TECHNIQUE: Multidetector CT imaging of the abdomen and pelvis was performed following the standard protocol before and following the bolus administration of intravenous contrast.  CONTRAST:  172m ISOVUE-300 IOPAMIDOL (ISOVUE-300) INJECTION 61%  COMPARISON:  CT scan 04/29/2017  FINDINGS: Lower chest: The lung bases are clear of acute process. No pleural effusion or pulmonary lesions. The heart is normal in size. No pericardial effusion. Coronary artery calcifications are noted. The distal esophagus and aorta are unremarkable.  Hepatobiliary: No focal hepatic lesions or intrahepatic biliary dilatation. The gallbladder is normal. No common bile duct dilatation.  Pancreas: No mass, inflammation or ductal dilatation.  Spleen: Normal size.  No focal lesions.  Adrenals/Urinary Tract: Adrenal glands are unremarkable.  No renal, ureteral or bladder calculi. Both kidneys demonstrate normal enhancement/perfusion. No worrisome renal lesions. There is a stable 2 cm cyst in the midpole region right kidney. The delayed images do not demonstrate any significant collecting system abnormalities. Both ureters are normal. The bladder is normal.  Stomach/Bowel: The stomach, duodenum, small bowel and colon are grossly normal without oral contrast. No inflammatory changes, mass lesions or obstructive  findings. The terminal ileum and appendix are normal.  Vascular/Lymphatic: Advanced atherosclerotic calcifications involving the aorta and iliac arteries but no focal aneurysm or dissection. The branch vessels are patent. The major venous structures are patent. No mesenteric or retroperitoneal mass or adenopathy.  Reproductive: The prostate gland and seminal vesicles are unremarkable.  Other: No pelvic mass or adenopathy. No free pelvic fluid collections. No inguinal mass or adenopathy. No abdominal wall hernia or subcutaneous lesions.  Musculoskeletal: A right femoral rod is noted. Both hips are normally located. Evidence of prior left iliac bone trauma with areas of heterotopic ossification.  Stable lumbar fusion hardware.  IMPRESSION: 1. No CT findings to account for the patient's hematuria. No renal, ureteral or bladder calculi or mass. Stable right renal cyst. 2. No acute abdominal/pelvic findings, mass lesions or adenopathy. 3. Stable age advanced atherosclerotic calcifications involving the abdominal aorta and iliac arteries. Coronary artery calcifications are also noted  Electronically Signed   By: PMarijo SanesM.D.   On: 01/23/2018 15:57  Complexity Note: Imaging results reviewed. Results shared with Mr. JTroop using Layman's terms.  Meds   Current Outpatient Medications:  .  acetaminophen (TYLENOL) 325 MG tablet, Take 2 tablets (650 mg total) by mouth every 6 (six) hours as needed for mild pain (or Fever >/= 101)., Disp: , Rfl:  .  aspirin EC 81 MG EC tablet, Take 1 tablet (81 mg total) by mouth daily., Disp: , Rfl:  .  benzonatate (TESSALON) 100 MG capsule, Take 1 capsule (100 mg total) by mouth 3 (three) times daily as needed for cough., Disp: 20 capsule, Rfl: 0 .  fluticasone (FLONASE) 50 MCG/ACT nasal spray, Place 2 sprays into both nostrils daily., Disp: 16 g, Rfl: 1 .  [START ON 02/03/2018] HYDROcodone-acetaminophen (NORCO) 10-325  MG tablet, Take 1 tablet by mouth every 6 (six) hours as needed for severe pain., Disp: 120 tablet, Rfl: 0 .  nicotine (NICODERM CQ - DOSED IN MG/24 HOURS) 21 mg/24hr patch, Place 1 patch (21 mg total) onto the skin daily., Disp: 28 patch, Rfl: 0 .  pantoprazole (PROTONIX) 40 MG tablet, Take 1 tablet (40 mg total) by mouth daily., Disp: 30 tablet, Rfl: 11 .  sodium chloride (OCEAN) 0.65 % SOLN nasal spray, Place 2 sprays into both nostrils as needed for congestion., Disp: 50 mL, Rfl: 1 .  tiZANidine (ZANAFLEX) 4 MG tablet, Take 1 tablet (4 mg total) by mouth every 8 (eight) hours as needed for muscle spasms., Disp: 90 tablet, Rfl: 0  ROS  Constitutional: Denies any fever or chills Gastrointestinal: No reported hemesis, hematochezia, vomiting, or acute GI distress Musculoskeletal: Denies any acute onset joint swelling, redness, loss of ROM, or weakness Neurological: No reported episodes of acute onset apraxia, aphasia, dysarthria, agnosia, amnesia, paralysis, loss of coordination, or loss of consciousness  Allergies  Mr. Ricketson has No Known Allergies.  [place]  Drug: Mr. Vanover  reports that he does not use drugs. Alcohol:  reports that he drank alcohol. Tobacco:  reports that he has been smoking cigarettes. He has been smoking about 0.00 packs per day for the past 30.00 years. He has never used smokeless tobacco. Medical:  has a past medical history of Chronic back pain and Flu. Surgical: Mr. Mcglocklin  has a past surgical history that includes Back surgery (Feb 2016); Spinal cord stimulator insertion (03/23/2017); Colonoscopy with propofol (N/A, 06/01/2017); Esophagogastroduodenoscopy (egd) with propofol (06/01/2017); polypectomy (06/01/2017); Spinal cord stimulator removal (N/A, 06/16/2017); Fracture surgery (Right, as child); and LEFT HEART CATH AND CORONARY ANGIOGRAPHY (N/A, 12/08/2017). Family: family history includes Alcohol abuse in his mother; Heart attack in his father and paternal grandfather;  Heart disease in his father; Obesity in his father.  Constitutional Exam  General appearance: Well nourished, well developed, and well hydrated. In no apparent acute distress Vitals:   01/24/18 1407  BP: (!) 146/88  Pulse: 71  Resp: 16  Temp: 97.6 F (36.4 C)  SpO2: 100%  Weight: 210 lb (95.3 kg)  Height: 6' 1"  (1.854 m)   BMI Assessment: Estimated body mass index is 27.71 kg/m as calculated from the following:   Height as of this encounter: 6' 1"  (1.854 m).   Weight as of this encounter: 210 lb (95.3 kg). Psych/Mental status: Alert, oriented x 3 (person, place, & time)       Eyes: PERLA Respiratory: No evidence of acute respiratory distress  Lumbar Spine Area Exam  Skin & Axial Inspection: Well healed scar from previous spine surgery detected Alignment: Symmetrical Functional ROM: Unrestricted ROM       Stability: No instability detected Muscle Tone/Strength: Functionally intact. No  obvious neuro-muscular anomalies detected. Sensory (Neurological): Unimpaired Palpation: Tender        Gait & Posture Assessment  Ambulation: Unassisted Gait: Relatively normal for age and body habitus Posture: WNL   Lower Extremity Exam    Side: Right lower extremity  Side: Left lower extremity  Stability: No instability observed          Stability: No instability observed          Skin & Extremity Inspection: Skin color, temperature, and hair growth are WNL. No peripheral edema or cyanosis. No masses, redness, swelling, asymmetry, or associated skin lesions. No contractures.  Skin & Extremity Inspection: Skin color, temperature, and hair growth are WNL. No peripheral edema or cyanosis. No masses, redness, swelling, asymmetry, or associated skin lesions. No contractures.  Functional ROM: Unrestricted ROM                  Functional ROM: Unrestricted ROM                  Muscle Tone/Strength: Functionally intact. No obvious neuro-muscular anomalies detected.  Muscle Tone/Strength: Functionally  intact. No obvious neuro-muscular anomalies detected.  Sensory (Neurological): Unimpaired  Sensory (Neurological): Unimpaired  Palpation: No palpable anomalies  Palpation: No palpable anomalies   Assessment  Primary Diagnosis & Pertinent Problem List: The primary encounter diagnosis was Chronic bilateral low back pain with bilateral sciatica. Diagnoses of Lumbar degenerative disc disease, Long term current use of opiate analgesic, and Chronic pain syndrome were also pertinent to this visit.  Status Diagnosis  Persistent Persistent Controlled 1. Chronic bilateral low back pain with bilateral sciatica   2. Lumbar degenerative disc disease   3. Long term current use of opiate analgesic   4. Chronic pain syndrome     Problems updated and reviewed during this visit: Problem  Opiate Use   Plan of Care  Pharmacotherapy (Medications Ordered): Meds ordered this encounter  Medications  . HYDROcodone-acetaminophen (NORCO) 10-325 MG tablet    Sig: Take 1 tablet by mouth every 6 (six) hours as needed for severe pain.    Dispense:  120 tablet    Refill:  0    Do not place this medication on "Automatic Refill". Patient may have prescription filled one day early if pharmacy is closed on scheduled refill date.    Order Specific Question:   Supervising Provider    Answer:   Milinda Pointer [phone]  . tiZANidine (ZANAFLEX) 4 MG tablet    Sig: Take 1 tablet (4 mg total) by mouth every 8 (eight) hours as needed for muscle spasms.    Dispense:  90 tablet    Refill:  0    Order Specific Question:   Supervising Provider    AnswerMilinda Pointer [phone]   New Prescriptions   No medications on file   Medications administered today: Padraic Marinos. Corbitt had no medications administered during this visit. Lab-work, procedure(s), and/or referral(s): Orders Placed This Encounter  Procedures  . Ambulatory referral to Neurosurgery   Imaging and/or referral(s): AMB REFERRAL TO  NEUROSURGERY  Interventional therapies: Planned, scheduled, and/or pending:   Not at this time.  Provider-requested follow-up: Return in about 4 weeks (around 02/21/2018) for MedMgmt.  Future Appointments  Date Time Provider [place]  01/24/2018  3:30 PM Billey Co, MD [facility] None   Primary Care Physician: Steele Sizer, MD Location: [facility] Note by: Vevelyn Francois NP Date: 01/24/2018; Time: 2:54 PM  Pain Score Disclaimer: We  use the NRS-11 scale. This is a self-reported, subjective measurement of pain severity with only modest accuracy. It is used primarily to identify changes within a particular patient. It must be understood that outpatient pain scales are significantly less accurate that those used for research, where they can be applied under ideal controlled circumstances with minimal exposure to variables. In reality, the score is likely to be a combination of pain intensity and pain affect, where pain affect describes the degree of emotional arousal or changes in action readiness caused by the sensory experience of pain. Factors such as social and work situation, setting, emotional state, anxiety levels, expectation, and prior pain experience may influence pain perception and show large inter-individual differences that may also be affected by time variables.  Patient instructions provided during this appointment: Patient Instructions  ____________________________________________________________________________________________  Medication Rules  Applies to: All patients receiving prescriptions (written or electronic).  Pharmacy of record: Pharmacy where electronic prescriptions will be sent. If written prescriptions are taken to a different pharmacy, please inform the nursing staff. The pharmacy listed in the electronic medical record should be the one where you would like electronic prescriptions to be sent.  Prescription  refills: Only during scheduled appointments. Applies to both, written and electronic prescriptions.  NOTE: The following applies primarily to controlled substances (Opioid* Pain Medications).   Patient's responsibilities: 1. Pain Pills: Bring all pain pills to every appointment (except for procedure appointments). 2. Pill Bottles: Bring pills in original pharmacy bottle. Always bring newest bottle. Bring bottle, even if empty. 3. Medication refills: You are responsible for knowing and keeping track of what medications you need refilled. The day before your appointment, write a list of all prescriptions that need to be refilled. Bring that list to your appointment and give it to the admitting nurse. Prescriptions will be written only during appointments. If you forget a medication, it will not be "Called in", "Faxed", or "electronically sent". You will need to get another appointment to get these prescribed. 4. Prescription Accuracy: You are responsible for carefully inspecting your prescriptions before leaving our office. Have the discharge nurse carefully go over each prescription with you, before taking them home. Make sure that your name is accurately spelled, that your address is correct. Check the name and dose of your medication to make sure it is accurate. Check the number of pills, and the written instructions to make sure they are clear and accurate. Make sure that you are given enough medication to last until your next medication refill appointment. 5. Taking Medication: Take medication as prescribed. Never take more pills than instructed. Never take medication more frequently than prescribed. Taking less pills or less frequently is permitted and encouraged, when it comes to controlled substances (written prescriptions).  6. Inform other Doctors: Always inform, all of your healthcare providers, of all the medications you take. 7. Pain Medication from other Providers: You are not allowed to  accept any additional pain medication from any other Doctor or Healthcare provider. There are two exceptions to this rule. (see below) In the event that you require additional pain medication, you are responsible for notifying us, as stated below. 8. Medication Agreement: You are responsible for carefully reading and following our Medication Agreement. This must be signed before receiving any prescriptions from our practice. Safely store a copy of your signed Agreement. Violations to the Agreement will result in no further prescriptions. (Additional copies of our Medication Agreement are available upon request.) 9. Laws, Rules, & Regulations: All patients are  expected to follow all Federal and [employer], [employer], Rules, [employer]. Ignorance of the Laws does not constitute a valid excuse. The use of any illegal substances is prohibited. 10. Adopted CDC guidelines & recommendations: Target dosing levels will be at or below 60 MME/day. Use of benzodiazepines** is not recommended.  Exceptions: There are only two exceptions to the rule of not receiving pain medications from other Healthcare Providers. 1. Exception #1 (Emergencies): In the event of an emergency (i.e.: accident requiring emergency care), you are allowed to receive additional pain medication. However, you are responsible for: As soon as you are able, call our office [phone], at any time of the day or night, and leave a message stating your name, the date and nature of the emergency, and the name and dose of the medication prescribed. In the event that your call is answered by a member of our staff, make sure to document and save the date, time, and the name of the person that took your information.  2. Exception #2 (Planned Surgery): In the event that you are scheduled by another doctor or dentist to have any type of surgery or procedure, you are allowed (for a period no longer than 30 days), to receive additional pain medication, for  the acute post-op pain. However, in this case, you are responsible for picking up a copy of our "Post-op Pain Management for Surgeons" handout, and giving it to your surgeon or dentist. This document is available at our office, and does not require an appointment to obtain it. Simply go to our office during business hours (Monday-Thursday from 8:00 AM to 4:00 PM) (Friday 8:00 AM to 12:00 Noon) or if you have a scheduled appointment with [place], prior to your surgery, and ask for it by name. In addition, you will need to provide [place] with your name, name of your surgeon, type of surgery, and date of procedure or surgery.  *Opioid medications include: morphine, codeine, oxycodone, oxymorphone, hydrocodone, hydromorphone, meperidine, tramadol, tapentadol, buprenorphine, fentanyl, methadone. **Benzodiazepine medications include: diazepam (Valium), alprazolam (Xanax), clonazepam (Klonopine), lorazepam (Ativan), clorazepate (Tranxene), chlordiazepoxide (Librium), estazolam (Prosom), oxazepam (Serax), temazepam (Restoril), triazolam (Halcion) (Last updated: 06/23/2017) ____________________________________________________________________________________________  Please bring your medication with you next appointment. If the bottle is empty please bring anyway. If not you will not get prescription until you bring bottle for [place] to count.   Hope everything turns out OK for you. Emmit Alexanders!!

## 2018-01-24 NOTE — Progress Notes (Signed)
Cystoscopy Procedure Note:  Indication: Gross hematuria  After informed consent and discussion of the procedure and its risks, DIETRICH SAMUELSON was positioned and prepped in the standard fashion. Cystoscopy was performed with a flexible cystoscope. The urethra, bladder neck and entire bladder was visualized in a standard fashion. The prostate was small in size with mild intravesical protrusion. The ureteral orifices were visualized in their normal location and orientation. Mucosa grossly normal. No abnormal findings on retroflexion. Cytology sent.  Imaging: CT urogram with no hydronephrosis, urolithiasis, or filling defects. Prostate volume 32g.   Findings: Normal cystoscopy  Assessment and Plan: -Negative hematuria workup, follow up cytology -We discussed behavioral strategies for his nocturia 3-4 times per night, including timed voiding, minimizing fluids in the evenings, double voiding prior to bed, and avoiding bladder irritants. -RTC with Larene Beach in 1 month for PVR, IPSS  Nickolas Madrid, MD 01/24/2018

## 2018-01-27 ENCOUNTER — Other Ambulatory Visit: Payer: Self-pay | Admitting: Urology

## 2018-02-03 ENCOUNTER — Other Ambulatory Visit: Payer: Self-pay | Admitting: Family Medicine

## 2018-02-03 DIAGNOSIS — J101 Influenza due to other identified influenza virus with other respiratory manifestations: Secondary | ICD-10-CM

## 2018-02-21 ENCOUNTER — Other Ambulatory Visit: Payer: Self-pay

## 2018-02-21 ENCOUNTER — Ambulatory Visit: Payer: BLUE CROSS/BLUE SHIELD | Attending: Nurse Practitioner | Admitting: Nurse Practitioner

## 2018-02-21 ENCOUNTER — Encounter: Payer: Self-pay | Admitting: Nurse Practitioner

## 2018-02-21 VITALS — BP 141/87 | HR 65 | Temp 98.1°F | Resp 16 | Ht 73.0 in | Wt 210.0 lb

## 2018-02-21 DIAGNOSIS — Z79899 Other long term (current) drug therapy: Secondary | ICD-10-CM | POA: Diagnosis not present

## 2018-02-21 DIAGNOSIS — Z8249 Family history of ischemic heart disease and other diseases of the circulatory system: Secondary | ICD-10-CM | POA: Diagnosis not present

## 2018-02-21 DIAGNOSIS — Z7982 Long term (current) use of aspirin: Secondary | ICD-10-CM | POA: Diagnosis not present

## 2018-02-21 DIAGNOSIS — M5136 Other intervertebral disc degeneration, lumbar region: Secondary | ICD-10-CM

## 2018-02-21 DIAGNOSIS — M5441 Lumbago with sciatica, right side: Secondary | ICD-10-CM

## 2018-02-21 DIAGNOSIS — I2510 Atherosclerotic heart disease of native coronary artery without angina pectoris: Secondary | ICD-10-CM | POA: Diagnosis not present

## 2018-02-21 DIAGNOSIS — G8929 Other chronic pain: Secondary | ICD-10-CM

## 2018-02-21 DIAGNOSIS — F17210 Nicotine dependence, cigarettes, uncomplicated: Secondary | ICD-10-CM | POA: Diagnosis not present

## 2018-02-21 DIAGNOSIS — M5442 Lumbago with sciatica, left side: Secondary | ICD-10-CM | POA: Insufficient documentation

## 2018-02-21 DIAGNOSIS — G894 Chronic pain syndrome: Secondary | ICD-10-CM | POA: Insufficient documentation

## 2018-02-21 DIAGNOSIS — Z79891 Long term (current) use of opiate analgesic: Secondary | ICD-10-CM | POA: Diagnosis not present

## 2018-02-21 DIAGNOSIS — N281 Cyst of kidney, acquired: Secondary | ICD-10-CM | POA: Diagnosis not present

## 2018-02-21 DIAGNOSIS — I700 Atherosclerosis of aorta: Secondary | ICD-10-CM | POA: Insufficient documentation

## 2018-02-21 DIAGNOSIS — Z9689 Presence of other specified functional implants: Secondary | ICD-10-CM | POA: Diagnosis not present

## 2018-02-21 DIAGNOSIS — M545 Low back pain: Secondary | ICD-10-CM | POA: Diagnosis present

## 2018-02-21 MED ORDER — TIZANIDINE HCL 4 MG PO TABS: 4.0000 mg | ORAL_TABLET | Freq: Three times a day (TID) | ORAL | 0 refills | Status: DC | PRN

## 2018-02-21 MED ORDER — GABAPENTIN 300 MG PO CAPS: 300.0000 mg | ORAL_CAPSULE | Freq: Every day | ORAL | 0 refills | Status: DC

## 2018-02-21 MED ORDER — GABAPENTIN 300 MG PO CAPS: 300.0000 mg | ORAL_CAPSULE | Freq: Three times a day (TID) | ORAL | 0 refills | Status: DC

## 2018-02-21 MED ORDER — HYDROCODONE-ACETAMINOPHEN 10-325 MG PO TABS: 1.0000 | ORAL_TABLET | Freq: Four times a day (QID) | ORAL | 0 refills | Status: DC | PRN

## 2018-02-21 NOTE — Patient Instructions (Addendum)
____________________________________________________________________________________________  Medication Rules  Applies to: All patients receiving prescriptions (written or electronic).  Pharmacy of record: Pharmacy where electronic prescriptions will be sent. If written prescriptions are taken to a different pharmacy, please inform the nursing staff. The pharmacy listed in the electronic medical record should be the one where you would like electronic prescriptions to be sent.  Prescription refills: Only during scheduled appointments. Applies to both, written and electronic prescriptions.  NOTE: The following applies primarily to controlled substances (Opioid* Pain Medications).   Patient's responsibilities: 1. Pain Pills: Bring all pain pills to every appointment (except for procedure appointments). 2. Pill Bottles: Bring pills in original pharmacy bottle. Always bring newest bottle. Bring bottle, even if empty. 3. Medication refills: You are responsible for knowing and keeping track of what medications you need refilled. The day before your appointment, write a list of all prescriptions that need to be refilled. Bring that list to your appointment and give it to the admitting nurse. Prescriptions will be written only during appointments. If you forget a medication, it will not be "Called in", "Faxed", or "electronically sent". You will need to get another appointment to get these prescribed. 4. Prescription Accuracy: You are responsible for carefully inspecting your prescriptions before leaving our office. Have the discharge nurse carefully go over each prescription with you, before taking them home. Make sure that your name is accurately spelled, that your address is correct. Check the name and dose of your medication to make sure it is accurate. Check the number of pills, and the written instructions to make sure they are clear and accurate. Make sure that you are given enough medication to last  until your next medication refill appointment. 5. Taking Medication: Take medication as prescribed. Never take more pills than instructed. Never take medication more frequently than prescribed. Taking less pills or less frequently is permitted and encouraged, when it comes to controlled substances (written prescriptions).  6. Inform other Doctors: Always inform, all of your healthcare providers, of all the medications you take. 7. Pain Medication from other Providers: You are not allowed to accept any additional pain medication from any other Doctor or Healthcare provider. There are two exceptions to this rule. (see below) In the event that you require additional pain medication, you are responsible for notifying us, as stated below. 8. Medication Agreement: You are responsible for carefully reading and following our Medication Agreement. This must be signed before receiving any prescriptions from our practice. Safely store a copy of your signed Agreement. Violations to the Agreement will result in no further prescriptions. (Additional copies of our Medication Agreement are available upon request.) 9. Laws, Rules, & Regulations: All patients are expected to follow all Federal and State Laws, Statutes, Rules, & Regulations. Ignorance of the Laws does not constitute a valid excuse. The use of any illegal substances is prohibited. 10. Adopted CDC guidelines & recommendations: Target dosing levels will be at or below 60 MME/day. Use of benzodiazepines** is not recommended.  Exceptions: There are only two exceptions to the rule of not receiving pain medications from other Healthcare Providers. 1. Exception #1 (Emergencies): In the event of an emergency (i.e.: accident requiring emergency care), you are allowed to receive additional pain medication. However, you are responsible for: As soon as you are able, call our office [phone], at any time of the day or night, and leave a message stating your name, the  date and nature of the emergency, and the name and dose of the medication   prescribed. In the event that your call is answered by a member of our staff, make sure to document and save the date, time, and the name of the person that took your information.  2. Exception #2 (Planned Surgery): In the event that you are scheduled by another doctor or dentist to have any type of surgery or procedure, you are allowed (for a period no longer than 30 days), to receive additional pain medication, for the acute post-op pain. However, in this case, you are responsible for picking up a copy of our "Post-op Pain Management for Surgeons" handout, and giving it to your surgeon or dentist. This document is available at our office, and does not require an appointment to obtain it. Simply go to our office during business hours (Monday-Thursday from 8:00 AM to 4:00 PM) (Friday 8:00 AM to 12:00 Noon) or if you have a scheduled appointment with us, prior to your surgery, and ask for it by name. In addition, you will need to provide us with your name, name of your surgeon, type of surgery, and date of procedure or surgery.  *Opioid medications include: morphine, codeine, oxycodone, oxymorphone, hydrocodone, hydromorphone, meperidine, tramadol, tapentadol, buprenorphine, fentanyl, methadone. **Benzodiazepine medications include: diazepam (Valium), alprazolam (Xanax), clonazepam (Klonopine), lorazepam (Ativan), clorazepate (Tranxene), chlordiazepoxide (Librium), estazolam (Prosom), oxazepam (Serax), temazepam (Restoril), triazolam (Halcion) (Last updated: 06/23/2017) ____________________________________________________________________________________________   ____________________________________________________________________________________________  Initial Gabapentin Titration  Medication used: Gabapentin (Generic Name) or Neurontin (Brand Name) 300 mg tablets/capsules  Reasons to stop increasing the dose:  Reason 1: You  get good relief of symptoms, in which case there is no need to increase the daily dose any further.    Reason 2: You develop some side effects, such as sleeping all of the time, difficulty concentrating, or becoming disoriented, in which case you need to go down on the dose, to the prior level, where you were not experiencing any side effects. Stay on that dose longer, to allow more time for your body to get use it, before attempting to increase it again.   Steps: Step 1: Start by taking 1 (one) tablet at bedtime x 7 (seven) days.  Step 2: After being on 1 (one) tablet for 7 (seven) days, then increase it to 2 (two) tablets at bedtime for another 7 (seven) days.  Step 3: Next, after being on 2 (two) tablets at bedtime for 7 (seven) days, then increase it to 3 (three) tablets at bedtime, and stay on that dose until you see your doctor.  Reasons to stop increasing the dose: Reason 1: You get good relief of symptoms, in which case there is no need to increase the daily dose any further.  Reason 2: You develop some side effects, such as sleeping all of the time, difficulty concentrating, or becoming disoriented, in which case you need to go down on the dose, to the prior level, where you were not experiencing any side effects. Stay on that dose longer, to allow more time for your body to get use it, before attempting to increase it again.  Endpoint: Once you have reached the maximum dose you can tolerate without side-effects, contact your physician so as to evaluate the results of the regimen.   Questions: Feel free to contact us for any questions or problems at [phone] ____________________________________________________________________________________________   

## 2018-02-21 NOTE — Progress Notes (Signed)
Patient's Name: Wesley Adams  MRN: [id number]  Referring Provider: Steele Sizer, MD  DOB: 23-May-1965  PCP: Steele Sizer, MD  DOS: 02/21/2018  Note by: Vevelyn Francois NP  Service setting: Ambulatory outpatient  Specialty: Interventional Pain Management  Location: [facility] (AMB) Pain Management Facility    Patient type: Established    Primary Reason(s) for Visit: Encounter for prescription drug management. (Level of risk: moderate)  CC: Back Pain (lower)  HPI  Mr. Delaughter is a 52 y.o. year old, male patient, who comes today for a medication management evaluation. He has Lumbar degenerative disc disease; Acute low back pain without sciatica; Spinal cord stimulator status; Chronic, continuous use of opioids; Tobacco use; Epigastric pain; Special screening for malignant neoplasms, colon; Chronic bilateral low back pain with bilateral sciatica; Chronic pain syndrome; Opiate use; Long term current use of opiate analgesic; Chest pain; Iliac artery stenosis, left ([place]); Atherosclerosis of aorta (HCC); and Coronary artery disease involving native heart on their problem list. His primarily concern today is the Back Pain (lower)  Pain Assessment: Location: Lower Back Radiating: down back of legs to knees, right hurts worse Onset: More than a month ago Duration: Chronic pain Quality: Aching, Constant, Discomfort Severity: 9 /10 (subjective, self-reported pain score)  Note: Reported level is compatible with observation. Clinically the patient looks like a 3/10 A 3/10 is viewed as "Moderate" and described as significantly interfering with activities of daily living (ADL). It becomes difficult to feed, bathe, get dressed, get on and off the toilet or to perform personal hygiene functions. Difficult to get in and out of bed or a chair without assistance. Very distracting. With effort, it can be ignored when deeply involved in activities.       When using our objective Pain Scale, levels between 6 and  10/10 are said to belong in an emergency room, as it progressively worsens from a 6/10, described as severely limiting, requiring emergency care not usually available at an outpatient pain management facility. At a 6/10 level, communication becomes difficult and requires great effort. Assistance to reach the emergency department may be required. Facial flushing and profuse sweating along with potentially dangerous increases in heart rate and blood pressure will be evident. Effect on ADL: sleeping, walking, standing, bending Timing: Constant Modifying factors: medications, rest, heat BP: (!) 141/87  HR: 65  Mr. See was last scheduled for an appointment on 01/24/2018 for medication management. During today's appointment we reviewed Mr. Lanuza chronic pain status, as well as his outpatient medication regimen. He feels like he has 2 knots in her back. He states that he failed to make his apt to Dr Arnoldo Morale. He admits that his pain has increased. He has overtaken his medication.   The patient  reports that he does not use drugs. His body mass index is 27.71 kg/m.  Further details on both, my assessment(s), as well as the proposed treatment plan, please see below.  Controlled Substance Pharmacotherapy Assessment REMS (Risk Evaluation and Mitigation Strategy)  Analgesic:Hydrocodone /APAP 10/314m QID MME/day:495mday GaIgnatius SpeckingRN  02/21/2018  2:08 PM  Sign at close encounter Nursing Pain Medication Assessment:  Safety precautions to be maintained throughout the outpatient stay will include: orient to surroundings, keep bed in low position, maintain call bell within reach at all times, provide assistance with transfer out of bed and ambulation.  Medication Inspection Compliance: Pill count conducted under aseptic conditions, in front of the patient. Neither the pills nor the bottle was removed from the patient's  sight at any time. Once count was completed pills were immediately  returned to the patient in their original bottle.  Medication: Hydrocodone/APAP Pill/Patch Count: 6 of 120 pills remain Pill/Patch Appearance: Markings consistent with prescribed medication Bottle Appearance: Standard pharmacy container. Clearly labeled. Filled Date: 10 / 11 / 2019 Last Medication intake:  Today   Pharmacokinetics: Liberation and absorption (onset of action): WNL Distribution (time to peak effect): WNL Metabolism and excretion (duration of action): WNL         Pharmacodynamics: Desired effects: Analgesia: Mr. Tamblyn reports >50% benefit. Functional ability: Patient reports that medication allows him to accomplish basic ADLs Clinically meaningful improvement in function (CMIF): Sustained CMIF goals met Perceived effectiveness: Described as relatively effective, allowing for increase in activities of daily living (ADL) Undesirable effects: Side-effects or Adverse reactions: None reported Monitoring: [place] PMP: Online review of the past 48-monthperiod conducted. Compliant with practice rules and regulations Last UDS on record: Summary  Date Value Ref Range Status  11/29/2017 FINAL  Final    Comment:    ==================================================================== TOXASSURE SELECT 13 (MW) ==================================================================== Test                             Result       Flag       Units Drug Present and Declared for Prescription Verification   Hydrocodone                    860          EXPECTED   ng/mg creat   Hydromorphone                  199          EXPECTED   ng/mg creat   Dihydrocodeine                 87           EXPECTED   ng/mg creat   Norhydrocodone                 >2841        EXPECTED   ng/mg creat    Sources of hydrocodone include scheduled prescription    medications. Hydromorphone, dihydrocodeine and norhydrocodone are    expected metabolites of hydrocodone. Hydromorphone and    dihydrocodeine are also  available as scheduled prescription    medications. ==================================================================== Test                      Result    Flag   Units      Ref Range   Creatinine              176              mg/dL      >=20 ==================================================================== Declared Medications:  The flagging and interpretation on this report are based on the  following declared medications.  Unexpected results may arise from  inaccuracies in the declared medications.  **Note: The testing scope of this panel includes these medications:  Hydrocodone (Hydrocodone-Acetaminophen)  **Note: The testing scope of this panel does not include following  reported medications:  Acetaminophen (Hydrocodone-Acetaminophen)  Tizanidine ==================================================================== For clinical consultation, please call ([phone] ====================================================================    UDS interpretation: Compliant          Medication Assessment Form: Reviewed. Patient indicates being compliant with therapy Treatment compliance: Non-compliant. Steps taken to remind  the patient of the seriousness of adequate therapy compliance Risk Assessment Profile: Aberrant behavior: taking more medication than prescribed and unsanctioned dose escalation Comorbid factors increasing risk of overdose: age 51-75 years old, history of substance abuse and male gender Opioid risk tool (ORT) (Total Score): 4 Personal History of Substance Abuse (SUD-Substance use disorder):  Alcohol: Negative  Illegal Drugs: Positive Male or Male  Rx Drugs:    ORT Risk Level calculation: Moderate Risk Risk of substance use disorder (SUD): Moderate-to-High Opioid Risk Tool - 02/21/18 1405      Family History of Substance Abuse   Illegal Drugs  Negative    Rx Drugs  Negative      Personal History of Substance Abuse   Alcohol  Negative    Illegal Drugs   Positive Male or Male      History of Preadolescent Sexual Abuse   History of Preadolescent Sexual Abuse  Negative or Male      Psychological Disease   Psychological Disease  Negative    ADD  Negative    OCD  Negative    Bipolar  Negative    Schizophrenia  Negative    Depression  Negative      Total Score   Opioid Risk Tool Scoring  4    Opioid Risk Interpretation  Moderate Risk      ORT Scoring interpretation table:  Score <3 = Low Risk for SUD  Score between 4-7 = Moderate Risk for SUD  Score >8 = High Risk for Opioid Abuse   Risk Mitigation Strategies:  Patient Counseling: Covered Patient-Prescriber Agreement (PPA): Present and active  Notification to other healthcare providers: Done  Pharmacologic Plan: No change in therapy, at this time.             Laboratory Chemistry  Inflammation Markers (CRP: Acute Phase) (ESR: Chronic Phase) No results found for: CRP, ESRSEDRATE, LATICACIDVEN                       Rheumatology Markers No results found for: RF, ANA, LABURIC, URICUR, LYMEIGGIGMAB, LYMEABIGMQN, HLAB27                      Renal Function Markers Lab Results  Component Value Date   BUN 12 01/02/2018   CREATININE 0.81 01/02/2018   BCR 15 01/02/2018   GFRAA 118 01/02/2018   GFRNONAA 102 01/02/2018                             Hepatic Function Markers Lab Results  Component Value Date   AST 24 12/06/2017   ALT 21 12/06/2017   ALBUMIN 4.4 12/06/2017   ALKPHOS 64 12/06/2017   LIPASE 41 12/06/2017                        Electrolytes Lab Results  Component Value Date   NA 138 12/12/2017   K 4.7 12/12/2017   CL 102 12/12/2017   CALCIUM 9.7 12/12/2017                        Neuropathy Markers Lab Results  Component Value Date   HIV Non Reactive 12/06/2017                        CNS Tests No results found for: COLORCSF, APPEARCSF, RBCCOUNTCSF, WBCCSF, POLYSCSF, LYMPHSCSF, EOSCSF, PROTEINCSF,  GLUCCSF, [place], [place], IGGCSF                       Bone Pathology Markers No results found for: VD25OH, [id number], [id number], [id number], 25OHVITD1, 25OHVITD2, 25OHVITD3, TESTOFREE, TESTOSTERONE                       Coagulation Parameters Lab Results  Component Value Date   INR 0.95 12/06/2017   LABPROT 12.6 12/06/2017   APTT 29 12/06/2017   PLT 204 12/12/2017                        Cardiovascular Markers Lab Results  Component Value Date   BNP 13.0 12/06/2017   TROPONINI 0.03 (HH) 12/07/2017   HGB 15.0 12/12/2017   HCT 44.3 12/12/2017                         CA Markers No results found for: CEA, CA125, LABCA2                      Note: Lab results reviewed.  Recent Diagnostic Imaging Results  CT HEMATURIA WORKUP CLINICAL DATA:  Episodes of gross hematuria.  EXAM: CT ABDOMEN AND PELVIS WITHOUT AND WITH CONTRAST  TECHNIQUE: Multidetector CT imaging of the abdomen and pelvis was performed following the standard protocol before and following the bolus administration of intravenous contrast.  CONTRAST:  180m ISOVUE-300 IOPAMIDOL (ISOVUE-300) INJECTION 61%  COMPARISON:  CT scan 04/29/2017  FINDINGS: Lower chest: The lung bases are clear of acute process. No pleural effusion or pulmonary lesions. The heart is normal in size. No pericardial effusion. Coronary artery calcifications are noted. The distal esophagus and aorta are unremarkable.  Hepatobiliary: No focal hepatic lesions or intrahepatic biliary dilatation. The gallbladder is normal. No common bile duct dilatation.  Pancreas: No mass, inflammation or ductal dilatation.  Spleen: Normal size.  No focal lesions.  Adrenals/Urinary Tract: Adrenal glands are unremarkable.  No renal, ureteral or bladder calculi. Both kidneys demonstrate normal enhancement/perfusion. No worrisome renal lesions. There is a stable 2 cm cyst in the midpole region right kidney. The delayed images do not demonstrate any significant collecting system abnormalities. Both ureters  are normal. The bladder is normal.  Stomach/Bowel: The stomach, duodenum, small bowel and colon are grossly normal without oral contrast. No inflammatory changes, mass lesions or obstructive findings. The terminal ileum and appendix are normal.  Vascular/Lymphatic: Advanced atherosclerotic calcifications involving the aorta and iliac arteries but no focal aneurysm or dissection. The branch vessels are patent. The major venous structures are patent. No mesenteric or retroperitoneal mass or adenopathy.  Reproductive: The prostate gland and seminal vesicles are unremarkable.  Other: No pelvic mass or adenopathy. No free pelvic fluid collections. No inguinal mass or adenopathy. No abdominal wall hernia or subcutaneous lesions.  Musculoskeletal: A right femoral rod is noted. Both hips are normally located. Evidence of prior left iliac bone trauma with areas of heterotopic ossification.  Stable lumbar fusion hardware.  IMPRESSION: 1. No CT findings to account for the patient's hematuria. No renal, ureteral or bladder calculi or mass. Stable right renal cyst. 2. No acute abdominal/pelvic findings, mass lesions or adenopathy. 3. Stable age advanced atherosclerotic calcifications involving the abdominal aorta and iliac arteries. Coronary artery calcifications are also noted  Electronically Signed   By: PMarijo SanesM.D.   On: 01/23/2018 15:57  Complexity Note: Imaging results reviewed.  Results shared with Mr. Renz, using Layman's terms.                         Meds   Current Outpatient Medications:  .  acetaminophen (TYLENOL) 325 MG tablet, Take 2 tablets (650 mg total) by mouth every 6 (six) hours as needed for mild pain (or Fever >/= 101)., Disp: , Rfl:  .  aspirin EC 81 MG EC tablet, Take 1 tablet (81 mg total) by mouth daily., Disp: , Rfl:  .  benzonatate (TESSALON) 100 MG capsule, Take 1 capsule (100 mg total) by mouth 3 (three) times daily as needed for cough., Disp:  20 capsule, Rfl: 0 .  fluticasone (FLONASE) 50 MCG/ACT nasal spray, Place 2 sprays into both nostrils daily., Disp: 16 g, Rfl: 1 .  [START ON 03/05/2018] HYDROcodone-acetaminophen (NORCO) 10-325 MG tablet, Take 1 tablet by mouth every 6 (six) hours as needed for severe pain., Disp: 120 tablet, Rfl: 0 .  nicotine (NICODERM CQ - DOSED IN MG/24 HOURS) 21 mg/24hr patch, Place 1 patch (21 mg total) onto the skin daily., Disp: 28 patch, Rfl: 0 .  pantoprazole (PROTONIX) 40 MG tablet, Take 1 tablet (40 mg total) by mouth daily., Disp: 30 tablet, Rfl: 11 .  sodium chloride (OCEAN) 0.65 % SOLN nasal spray, Place 2 sprays into both nostrils as needed for congestion., Disp: 50 mL, Rfl: 1 .  tiZANidine (ZANAFLEX) 4 MG tablet, Take 1 tablet (4 mg total) by mouth every 8 (eight) hours as needed for muscle spasms., Disp: 90 tablet, Rfl: 0 .  gabapentin (NEURONTIN) 300 MG capsule, Take 1 capsule (300 mg total) by mouth 3 (three) times daily. At bedtime, Disp: 90 capsule, Rfl: 0  ROS  Constitutional: Denies any fever or chills Gastrointestinal: No reported hemesis, hematochezia, vomiting, or acute GI distress Musculoskeletal: Denies any acute onset joint swelling, redness, loss of ROM, or weakness Neurological: No reported episodes of acute onset apraxia, aphasia, dysarthria, agnosia, amnesia, paralysis, loss of coordination, or loss of consciousness  Allergies  Mr. Boeve has No Known Allergies.  [place]  Drug: Mr. Jafari  reports that he does not use drugs. Alcohol:  reports that he drank alcohol. Tobacco:  reports that he has been smoking cigarettes. He has been smoking about 0.00 packs per day for the past 30.00 years. He has never used smokeless tobacco. Medical:  has a past medical history of Chronic back pain and Flu. Surgical: Mr. Corella  has a past surgical history that includes Back surgery (Feb 2016); Spinal cord stimulator insertion (03/23/2017); Colonoscopy with propofol (N/A, 06/01/2017);  Esophagogastroduodenoscopy (egd) with propofol (06/01/2017); polypectomy (06/01/2017); Spinal cord stimulator removal (N/A, 06/16/2017); Fracture surgery (Right, as child); and LEFT HEART CATH AND CORONARY ANGIOGRAPHY (N/A, 12/08/2017). Family: family history includes Alcohol abuse in his mother; Heart attack in his father and paternal grandfather; Heart disease in his father; Obesity in his father.  Constitutional Exam  General appearance: Well nourished, well developed, and well hydrated. In no apparent acute distress Vitals:   02/21/18 1357  BP: (!) 141/87  Pulse: 65  Resp: 16  Temp: 98.1 F (36.7 C)  SpO2: 100%  Weight: 210 lb (95.3 kg)  Height: _0  (1.854 m)  Psych/Mental status: Alert, oriented x 3 (person, place, & time)       Eyes: PERLA Respiratory: No evidence of acute respiratory distress   Thoracic Spine Area Exam  Skin & Axial Inspection: No masses, redness, or swelling Alignment: Symmetrical  Functional ROM: Unrestricted ROM Stability: No instability detected Muscle Tone/Strength: Functionally intact. No obvious neuro-muscular anomalies detected. Sensory (Neurological): Unimpaired Muscle strength & Tone: No palpable anomalies  Lumbar Spine Area Exam  Skin & Axial Inspection: Well healed scar from previous spine surgery detected Alignment: Symmetrical Functional ROM: Unrestricted ROM       Stability: No instability detected Muscle Tone/Strength: Functionally intact. No obvious neuro-muscular anomalies detected. Sensory (Neurological): Unimpaired Palpation: No palpable anomalies       Provocative Tests: Hyperextension/rotation test: Positive bilaterally for facet joint pain. Lumbar quadrant test (Kemp's test): deferred today       Lateral bending test: deferred today        Gait & Posture Assessment  Ambulation: Unassisted Gait: Relatively normal for age and body habitus Posture: WNL   Lower Extremity Exam    Side: Right lower extremity  Side: Left lower  extremity  Stability: No instability observed          Stability: No instability observed          Skin & Extremity Inspection: Skin color, temperature, and hair growth are WNL. No peripheral edema or cyanosis. No masses, redness, swelling, asymmetry, or associated skin lesions. No contractures.  Skin & Extremity Inspection: Skin color, temperature, and hair growth are WNL. No peripheral edema or cyanosis. No masses, redness, swelling, asymmetry, or associated skin lesions. No contractures.  Functional ROM: Unrestricted ROM                  Functional ROM: Unrestricted ROM                  Muscle Tone/Strength: Functionally intact. No obvious neuro-muscular anomalies detected.  Muscle Tone/Strength: Functionally intact. No obvious neuro-muscular anomalies detected.  Sensory (Neurological): Unimpaired  Sensory (Neurological): Unimpaired  Palpation: No palpable anomalies  Palpation: No palpable anomalies   Assessment  Primary Diagnosis & Pertinent Problem List: The primary encounter diagnosis was Chronic bilateral low back pain with bilateral sciatica. Diagnoses of Lumbar degenerative disc disease, Chronic pain syndrome, and Long term current use of opiate analgesic were also pertinent to this visit.  Status Diagnosis  Worsening Worsening Controlled 1. Chronic bilateral low back pain with bilateral sciatica   2. Lumbar degenerative disc disease   3. Chronic pain syndrome   4. Long term current use of opiate analgesic     Problems updated and reviewed during this visit: No problems updated. Plan of Care  Pharmacotherapy (Medications Ordered): Meds ordered this encounter  Medications  . tiZANidine (ZANAFLEX) 4 MG tablet    Sig: Take 1 tablet (4 mg total) by mouth every 8 (eight) hours as needed for muscle spasms.    Dispense:  90 tablet    Refill:  0    Order Specific Question:   Supervising Provider    Answer:   Milinda Pointer [phone]  . HYDROcodone-acetaminophen (NORCO) 10-325 MG  tablet    Sig: Take 1 tablet by mouth every 6 (six) hours as needed for severe pain.    Dispense:  120 tablet    Refill:  0    Do not place this medication on "Automatic Refill". Patient may have prescription filled one day early if pharmacy is closed on scheduled refill date.    Order Specific Question:   Supervising Provider    Answer:   Milinda Pointer [phone]  . DISCONTD: gabapentin (NEURONTIN) 300 MG capsule    Sig: Take 1 capsule (300 mg total) by mouth at bedtime.    Dispense:  30 capsule    Refill:  0    Order Specific Question:   Supervising Provider    Answer:   Milinda Pointer [phone]  . gabapentin (NEURONTIN) 300 MG capsule    Sig: Take 1 capsule (300 mg total) by mouth 3 (three) times daily. At bedtime    Dispense:  90 capsule    Refill:  0    Order Specific Question:   Supervising Provider    Answer:   Milinda Pointer [phone]   New Prescriptions   GABAPENTIN (NEURONTIN) 300 MG CAPSULE    Take 1 capsule (300 mg total) by mouth 3 (three) times daily. At bedtime   Medications administered today: Idalia Needle. Hink had no medications administered during this visit. Lab-work, procedure(s), and/or referral(s): Orders Placed This Encounter  Procedures  . ToxASSURE Select 13 (MW), Urine   Imaging and/or referral(s): None  Interventional therapies: Planned, scheduled, and/or pending:   Not at this time.    Provider-requested follow-up: Return in about 4 weeks (around 03/21/2018) for MedMgmt.  Future Appointments  Date Time Provider [place]  03/21/2018  1:45 PM Vevelyn Francois, NP [facility] None   Primary Care Physician: Steele Sizer, MD Location: [facility] Note by: Vevelyn Francois NP Date: 02/21/2018; Time: 3:22 PM  Pain Score Disclaimer: We use the NRS-11 scale. This is a self-reported, subjective measurement of pain severity with only modest accuracy. It is used primarily to identify changes within a  particular patient. It must be understood that outpatient pain scales are significantly less accurate that those used for research, where they can be applied under ideal controlled circumstances with minimal exposure to variables. In reality, the score is likely to be a combination of pain intensity and pain affect, where pain affect describes the degree of emotional arousal or changes in action readiness caused by the sensory experience of pain. Factors such as social and work situation, setting, emotional state, anxiety levels, expectation, and prior pain experience may influence pain perception and show large inter-individual differences that may also be affected by time variables.  Patient instructions provided during this appointment: Patient Instructions   ____________________________________________________________________________________________  Medication Rules  Applies to: All patients receiving prescriptions (written or electronic).  Pharmacy of record: Pharmacy where electronic prescriptions will be sent. If written prescriptions are taken to a different pharmacy, please inform the nursing staff. The pharmacy listed in the electronic medical record should be the one where you would like electronic prescriptions to be sent.  Prescription refills: Only during scheduled appointments. Applies to both, written and electronic prescriptions.  NOTE: The following applies primarily to controlled substances (Opioid* Pain Medications).   Patient's responsibilities: 1. Pain Pills: Bring all pain pills to every appointment (except for procedure appointments). 2. Pill Bottles: Bring pills in original pharmacy bottle. Always bring newest bottle. Bring bottle, even if empty. 3. Medication refills: You are responsible for knowing and keeping track of what medications you need refilled. The day before your appointment, write a list of all prescriptions that need to be refilled. Bring that list to your  appointment and give it to the admitting nurse. Prescriptions will be written only during appointments. If you forget a medication, it will not be "Called in", "Faxed", or "electronically sent". You will need to get another appointment to get these prescribed. 4. Prescription Accuracy: You are responsible for carefully inspecting your prescriptions before leaving our office. Have the discharge nurse carefully go over each prescription with you, before taking them  home. Make sure that your name is accurately spelled, that your address is correct. Check the name and dose of your medication to make sure it is accurate. Check the number of pills, and the written instructions to make sure they are clear and accurate. Make sure that you are given enough medication to last until your next medication refill appointment. 5. Taking Medication: Take medication as prescribed. Never take more pills than instructed. Never take medication more frequently than prescribed. Taking less pills or less frequently is permitted and encouraged, when it comes to controlled substances (written prescriptions).  6. Inform other Doctors: Always inform, all of your healthcare providers, of all the medications you take. 7. Pain Medication from other Providers: You are not allowed to accept any additional pain medication from any other Doctor or Healthcare provider. There are two exceptions to this rule. (see below) In the event that you require additional pain medication, you are responsible for notifying us, as stated below. 8. Medication Agreement: You are responsible for carefully reading and following our Medication Agreement. This must be signed before receiving any prescriptions from our practice. Safely store a copy of your signed Agreement. Violations to the Agreement will result in no further prescriptions. (Additional copies of our Medication Agreement are available upon request.) 9. Laws, Rules, & Regulations: All patients are  expected to follow all Federal and [employer], [employer], Rules, [employer]. Ignorance of the Laws does not constitute a valid excuse. The use of any illegal substances is prohibited. 10. Adopted CDC guidelines & recommendations: Target dosing levels will be at or below 60 MME/day. Use of benzodiazepines** is not recommended.  Exceptions: There are only two exceptions to the rule of not receiving pain medications from other Healthcare Providers. 1. Exception #1 (Emergencies): In the event of an emergency (i.e.: accident requiring emergency care), you are allowed to receive additional pain medication. However, you are responsible for: As soon as you are able, call our office [phone], at any time of the day or night, and leave a message stating your name, the date and nature of the emergency, and the name and dose of the medication prescribed. In the event that your call is answered by a member of our staff, make sure to document and save the date, time, and the name of the person that took your information.  2. Exception #2 (Planned Surgery): In the event that you are scheduled by another doctor or dentist to have any type of surgery or procedure, you are allowed (for a period no longer than 30 days), to receive additional pain medication, for the acute post-op pain. However, in this case, you are responsible for picking up a copy of our "Post-op Pain Management for Surgeons" handout, and giving it to your surgeon or dentist. This document is available at our office, and does not require an appointment to obtain it. Simply go to our office during business hours (Monday-Thursday from 8:00 AM to 4:00 PM) (Friday 8:00 AM to 12:00 Noon) or if you have a scheduled appointment with [place], prior to your surgery, and ask for it by name. In addition, you will need to provide [place] with your name, name of your surgeon, type of surgery, and date of procedure or surgery.  *Opioid medications include: morphine, codeine,  oxycodone, oxymorphone, hydrocodone, hydromorphone, meperidine, tramadol, tapentadol, buprenorphine, fentanyl, methadone. **Benzodiazepine medications include: diazepam (Valium), alprazolam (Xanax), clonazepam (Klonopine), lorazepam (Ativan), clorazepate (Tranxene), chlordiazepoxide (Librium), estazolam (Prosom), oxazepam (Serax), temazepam (Restoril), triazolam (Halcion) (Last updated:  06/23/2017) ____________________________________________________________________________________________   ____________________________________________________________________________________________  Initial Gabapentin Titration  Medication used: Gabapentin (Generic Name) or Neurontin (Brand Name) 300 mg tablets/capsules  Reasons to stop increasing the dose:  Reason 1: You get good relief of symptoms, in which case there is no need to increase the daily dose any further.    Reason 2: You develop some side effects, such as sleeping all of the time, difficulty concentrating, or becoming disoriented, in which case you need to go down on the dose, to the prior level, where you were not experiencing any side effects. Stay on that dose longer, to allow more time for your body to get use it, before attempting to increase it again.   Steps: Step 1: Start by taking 1 (one) tablet at bedtime x 7 (seven) days.  Step 2: After being on 1 (one) tablet for 7 (seven) days, then increase it to 2 (two) tablets at bedtime for another 7 (seven) days.  Step 3: Next, after being on 2 (two) tablets at bedtime for 7 (seven) days, then increase it to 3 (three) tablets at bedtime, and stay on that dose until you see your doctor.  Reasons to stop increasing the dose: Reason 1: You get good relief of symptoms, in which case there is no need to increase the daily dose any further.  Reason 2: You develop some side effects, such as sleeping all of the time, difficulty concentrating, or becoming disoriented, in which case you need to go  down on the dose, to the prior level, where you were not experiencing any side effects. Stay on that dose longer, to allow more time for your body to get use it, before attempting to increase it again.  Endpoint: Once you have reached the maximum dose you can tolerate without side-effects, contact your physician so as to evaluate the results of the regimen.   Questions: Feel free to contact us for any questions or problems at [phone] ____________________________________________________________________________________________

## 2018-02-21 NOTE — Progress Notes (Signed)
Nursing Pain Medication Assessment:  Safety precautions to be maintained throughout the outpatient stay will include: orient to surroundings, keep bed in low position, maintain call bell within reach at all times, provide assistance with transfer out of bed and ambulation.  Medication Inspection Compliance: Pill count conducted under aseptic conditions, in front of the patient. Neither the pills nor the bottle was removed from the patient's sight at any time. Once count was completed pills were immediately returned to the patient in their original bottle.  Medication: Hydrocodone/APAP Pill/Patch Count: 6 of 120 pills remain Pill/Patch Appearance: Markings consistent with prescribed medication Bottle Appearance: Standard pharmacy container. Clearly labeled. Filled Date: 10 / 11 / 2019 Last Medication intake:  Today

## 2018-03-02 ENCOUNTER — Telehealth: Payer: Self-pay

## 2018-03-02 ENCOUNTER — Other Ambulatory Visit: Payer: Self-pay | Admitting: Nurse Practitioner

## 2018-03-02 DIAGNOSIS — G8929 Other chronic pain: Secondary | ICD-10-CM

## 2018-03-02 DIAGNOSIS — M5136 Other intervertebral disc degeneration, lumbar region: Secondary | ICD-10-CM

## 2018-03-02 DIAGNOSIS — M5441 Lumbago with sciatica, right side: Principal | ICD-10-CM

## 2018-03-02 DIAGNOSIS — M5442 Lumbago with sciatica, left side: Principal | ICD-10-CM

## 2018-03-02 DIAGNOSIS — G894 Chronic pain syndrome: Secondary | ICD-10-CM

## 2018-03-02 NOTE — Telephone Encounter (Addendum)
Jacqlyn Larsen called regarding a referral for Neuro to evaluate replacing SCS that Dr Arnoldo Morale had put in. After looking over referral Dr Arnoldo Morale has declined replacing SCS,He [facility]'s [facility] to a [facility] to do the [facility].

## 2018-03-02 NOTE — Telephone Encounter (Signed)
Voicemail left with patient that Dr Arnoldo Morale does not want to do the SCS revision and is recommending a referral to a University.  Wesley Adams has put in a referral to [facility] for SCS revision.

## 2018-03-21 ENCOUNTER — Encounter: Payer: BLUE CROSS/BLUE SHIELD | Admitting: Nurse Practitioner

## 2018-03-29 ENCOUNTER — Telehealth: Payer: Self-pay | Admitting: Family Medicine

## 2018-03-29 DIAGNOSIS — E7800 Pure hypercholesterolemia, unspecified: Secondary | ICD-10-CM

## 2018-03-29 NOTE — Telephone Encounter (Signed)
Received FMLA paperwork request.  It states for 03/15/2018 - I don't see anything from this date that would require FMLA.  If this is for his hospital admission back in August, please let me know and I will complete the paperwork.

## 2018-03-30 NOTE — Telephone Encounter (Signed)
I tired to call the first number and it is to [facility] and when I called the second number they asked if I could call back after 3:30pm, so I was not able to find out why he is requesting FMLA.

## 2018-04-04 MED ORDER — ATORVASTATIN CALCIUM 40 MG PO TABS: 40.0000 mg | ORAL_TABLET | Freq: Every day | ORAL | 1 refills | Status: DC

## 2018-04-04 NOTE — Addendum Note (Signed)
Addended by: Hubbard Hartshorn on: 04/04/2018 07:32 AM   Modules accepted: Orders

## 2018-04-04 NOTE — Telephone Encounter (Addendum)
Paperwork is complete, and review of his chart shows that he SHOULD be taking a statin medication for his cholesterol. I am sending in Atorvastatin 40mg .  He also needs a 3 months follow up with me or Dr. Ancil Boozer.  Please advise and let me know if he has any questions.

## 2018-04-13 ENCOUNTER — Encounter: Payer: Self-pay | Admitting: Nurse Practitioner

## 2018-04-13 ENCOUNTER — Other Ambulatory Visit: Payer: Self-pay

## 2018-04-13 ENCOUNTER — Ambulatory Visit: Payer: BLUE CROSS/BLUE SHIELD | Attending: Nurse Practitioner | Admitting: Nurse Practitioner

## 2018-04-13 VITALS — BP 152/93 | HR 72 | Temp 98.0°F | Ht 73.0 in | Wt 205.0 lb

## 2018-04-13 DIAGNOSIS — R079 Chest pain, unspecified: Secondary | ICD-10-CM | POA: Diagnosis not present

## 2018-04-13 DIAGNOSIS — R1013 Epigastric pain: Secondary | ICD-10-CM | POA: Insufficient documentation

## 2018-04-13 DIAGNOSIS — F329 Major depressive disorder, single episode, unspecified: Secondary | ICD-10-CM | POA: Diagnosis not present

## 2018-04-13 DIAGNOSIS — Z6827 Body mass index (BMI) 27.0-27.9, adult: Secondary | ICD-10-CM | POA: Insufficient documentation

## 2018-04-13 DIAGNOSIS — M5441 Lumbago with sciatica, right side: Secondary | ICD-10-CM | POA: Insufficient documentation

## 2018-04-13 DIAGNOSIS — Z79891 Long term (current) use of opiate analgesic: Secondary | ICD-10-CM | POA: Insufficient documentation

## 2018-04-13 DIAGNOSIS — M5442 Lumbago with sciatica, left side: Secondary | ICD-10-CM

## 2018-04-13 DIAGNOSIS — F17210 Nicotine dependence, cigarettes, uncomplicated: Secondary | ICD-10-CM | POA: Insufficient documentation

## 2018-04-13 DIAGNOSIS — Z7982 Long term (current) use of aspirin: Secondary | ICD-10-CM | POA: Insufficient documentation

## 2018-04-13 DIAGNOSIS — Z8249 Family history of ischemic heart disease and other diseases of the circulatory system: Secondary | ICD-10-CM | POA: Diagnosis not present

## 2018-04-13 DIAGNOSIS — I2510 Atherosclerotic heart disease of native coronary artery without angina pectoris: Secondary | ICD-10-CM | POA: Insufficient documentation

## 2018-04-13 DIAGNOSIS — I700 Atherosclerosis of aorta: Secondary | ICD-10-CM | POA: Insufficient documentation

## 2018-04-13 DIAGNOSIS — G894 Chronic pain syndrome: Secondary | ICD-10-CM | POA: Diagnosis not present

## 2018-04-13 DIAGNOSIS — Z5181 Encounter for therapeutic drug level monitoring: Secondary | ICD-10-CM | POA: Insufficient documentation

## 2018-04-13 DIAGNOSIS — F429 Obsessive-compulsive disorder, unspecified: Secondary | ICD-10-CM | POA: Diagnosis not present

## 2018-04-13 DIAGNOSIS — M5136 Other intervertebral disc degeneration, lumbar region: Secondary | ICD-10-CM

## 2018-04-13 DIAGNOSIS — Z79899 Other long term (current) drug therapy: Secondary | ICD-10-CM | POA: Diagnosis not present

## 2018-04-13 DIAGNOSIS — G8929 Other chronic pain: Secondary | ICD-10-CM

## 2018-04-13 NOTE — Progress Notes (Signed)
Patient's Name: Wesley Adams  MRN: [id number]  Referring Provider: Steele Sizer, MD  DOB: [birth year]  PCP: Steele Sizer, MD  DOS: 04/13/2018  Note by: Vevelyn Francois NP  Service setting: Ambulatory outpatient  Specialty: Interventional Pain Management  Location: [facility] (AMB) Pain Management Facility    Patient type: Established    Primary Reason(s) for Visit: Encounter for prescription drug management. (Level of risk: moderate)  CC: Back Pain  HPI  Wesley Adams is a 52 y.o. year old, male patient, who comes today for a medication management evaluation. He has Lumbar degenerative disc disease; Acute low back pain without sciatica; Spinal cord stimulator status; Chronic, continuous use of opioids; Tobacco use; Epigastric pain; Special screening for malignant neoplasms, colon; Chronic bilateral low back pain with bilateral sciatica; Chronic pain syndrome; Opiate use; Long term current use of opiate analgesic; Chest pain; Iliac artery stenosis, left ([place]); Atherosclerosis of aorta (HCC); and Coronary artery disease involving native heart on their problem list. His primarily concern today is the Back Pain  Pain Assessment: Location: Lower Back Radiating: down radiaties down both legs to knees Onset: More than a month ago Duration: Chronic pain Quality: Aching, Constant, Discomfort Severity: 10-Worst pain ever/10 (subjective, self-reported pain score)  Note: Reported level is compatible with observation. Clinically the patient looks like a 2/10 A 2/10 is viewed as "Mild to Moderate" and described as noticeable and distracting. Impossible to hide from other people. More frequent flare-ups. Still possible to adapt and function close to normal. It can be very annoying and may have occasional stronger flare-ups. With discipline, patients may get used to it and adapt. Information on the proper use of the pain scale provided to the patient today. When using our objective Pain Scale, levels between 6  and 10/10 are said to belong in an emergency room, as it progressively worsens from a 6/10, described as severely limiting, requiring emergency care not usually available at an outpatient pain management facility. At a 6/10 level, communication becomes difficult and requires great effort. Assistance to reach the emergency department may be required. Facial flushing and profuse sweating along with potentially dangerous increases in heart rate and blood pressure will be evident. Effect on ADL: no prolonged walking or standing, wakes me up at night, unable to bending Timing: Constant Modifying factors: medication, rest, heat BP: (!) 152/93  HR: 72  Wesley Adams was last scheduled for an appointment on 03/21/2018 for medication management. During today's appointment we reviewed Wesley Adams chronic pain status, as well as his outpatient medication regimen. He feels like he may have another disc messed up. He states that when he is walking and takes a step it causes his pain to be worse. He feels likd a sudden turn to the right also brings on the same pain. He admits that he did help his son move and this may have caused more pain.  He states that he needs his FMLA papers completed. This was completed by PCP with a completely related to his recent problems with chest pain.  He admits that he is currently on limitations because of his back pain.  He was given a referral to [facility] neurosurgery for evaluation of spinal cord stimulator.  He states that he lost his phone and is unsure if this office is try to contact him for a new patient appointment. He did not complete his UDS at his last visit 02/21/2018. This is a violation of his narcotic agreement.  He submitted a urine this morning  however it indicated low temperature.  The patient  reports no history of drug use. His body mass index is 27.05 kg/m.  Further details on both, my assessment(s), as well as the proposed treatment plan, please see  below.  Controlled Substance Pharmacotherapy Assessment REMS (Risk Evaluation and Mitigation Strategy)  Analgesic:Hydrocodone /APAP 10/368m QID MME/day:462mday BrChauncey FischerRN  04/13/2018  8:39 AM  Sign when Signing Visit Nursing Pain Medication Assessment:  Safety precautions to be maintained throughout the outpatient stay will include: orient to surroundings, keep bed in low position, maintain call bell within reach at all times, provide assistance with transfer out of bed and ambulation.  Medication Inspection Compliance: Pill count conducted under aseptic conditions, in front of the patient. Neither the pills nor the bottle was removed from the patient's sight at any time. Once count was completed pills were immediately returned to the patient in their original bottle.  Medication: Hydrocodone/APAP Pill/Patch Count: 0 of 120 pills remain Pill/Patch Appearance: Markings consistent with prescribed medication Bottle Appearance: Standard pharmacy container. Clearly labeled. Filled Date: 1134 10 / 2019 Last Medication intake:  2 weeks ago   Pharmacokinetics: Liberation and absorption (onset of action): WNL Distribution (time to peak effect): WNL Metabolism and excretion (duration of action): WNL         Pharmacodynamics: Desired effects: Analgesia: Wesley Adams >50% benefit. Functional ability: Patient reports that medication allows him to accomplish basic ADLs Clinically meaningful improvement in function (CMIF): Sustained CMIF goals met Perceived effectiveness: Described as relatively effective, allowing for increase in activities of daily living (ADL) Undesirable effects: Side-effects or Adverse reactions: None reported Monitoring: [place] PMP: Online review of the past 1284-monthriod conducted. Compliant with practice rules and regulations Last UDS on record: Summary  Date Value Ref Range Status  11/29/2017 FINAL  Final    Comment:     ==================================================================== TOXASSURE SELECT 13 (MW) ==================================================================== Test                             Result       Flag       Units Drug Present and Declared for Prescription Verification   Hydrocodone                    860          EXPECTED   ng/mg creat   Hydromorphone                  199          EXPECTED   ng/mg creat   Dihydrocodeine                 87           EXPECTED   ng/mg creat   Norhydrocodone                 >2841        EXPECTED   ng/mg creat    Sources of hydrocodone include scheduled prescription    medications. Hydromorphone, dihydrocodeine and norhydrocodone are    expected metabolites of hydrocodone. Hydromorphone and    dihydrocodeine are also available as scheduled prescription    medications. ==================================================================== Test                      Result    Flag   Units      Ref Range   Creatinine  176              mg/dL      >=20 ==================================================================== Declared Medications:  The flagging and interpretation on this report are based on the  following declared medications.  Unexpected results may arise from  inaccuracies in the declared medications.  **Note: The testing scope of this panel includes these medications:  Hydrocodone (Hydrocodone-Acetaminophen)  **Note: The testing scope of this panel does not include following  reported medications:  Acetaminophen (Hydrocodone-Acetaminophen)  Tizanidine ==================================================================== For clinical consultation, please call [phone]. ====================================================================    UDS interpretation: Compliant          Medication Assessment Form: Reviewed. Patient indicates being compliant with therapy Treatment compliance: Deficiencies noted and steps taken  to remind the patient of the seriousness of adequate therapy compliance Risk Assessment Profile: Aberrant behavior: suspicious inability to provide sample for drug testing Comorbid factors increasing risk of overdose: age 69-58 years old, history of alcoholism, history of substance abuse and male gender Opioid risk tool (ORT) (Total Score): 4 Personal History of Substance Abuse (SUD-Substance use disorder):  Alcohol: Negative  Illegal Drugs: Positive Male or Male  Rx Drugs: Negative  ORT Risk Level calculation: Moderate Risk Risk of substance use disorder (SUD): High Opioid Risk Tool - 04/13/18 0838      Family History of Substance Abuse   Alcohol  Negative    Illegal Drugs  Negative    Rx Drugs  Negative      Personal History of Substance Abuse   Alcohol  Negative    Illegal Drugs  Positive Male or Male    Rx Drugs  Negative      Age   Age between 76-45 years   No      History of Preadolescent Sexual Abuse   History of Preadolescent Sexual Abuse  Negative or Male      Psychological Disease   Psychological Disease  Negative    ADD  Negative    OCD  Negative    Bipolar  Negative    Schizophrenia  Negative    Depression  Negative      Total Score   Opioid Risk Tool Scoring  4    Opioid Risk Interpretation  Moderate Risk      ORT Scoring interpretation table:  Score <3 = Low Risk for SUD  Score between 4-7 = Moderate Risk for SUD  Score >8 = High Risk for Opioid Abuse   Risk Mitigation Strategies:  Patient Counseling: Covered Patient-Prescriber Agreement (PPA): Present and active  Notification to other healthcare providers: Done  Pharmacologic Plan: Treatment plan will be modified to exclude opioids. He left without providing [place] with a sample for the "Drug Screening Test".  Laboratory Chemistry  Inflammation Markers (CRP: Acute Phase) (ESR: Chronic Phase) No results found for: CRP, ESRSEDRATE, LATICACIDVEN                       Rheumatology Markers No  results found for: RF, ANA, LABURIC, URICUR, LYMEIGGIGMAB, LYMEABIGMQN, HLAB27                      Renal Function Markers Lab Results  Component Value Date   BUN 12 01/02/2018   CREATININE 0.81 01/02/2018   BCR 15 01/02/2018   GFRAA 118 01/02/2018   GFRNONAA 102 01/02/2018  Hepatic Function Markers Lab Results  Component Value Date   AST 24 12/06/2017   ALT 21 12/06/2017   ALBUMIN 4.4 12/06/2017   ALKPHOS 64 12/06/2017   LIPASE 41 12/06/2017                        Electrolytes Lab Results  Component Value Date   NA 138 12/12/2017   K 4.7 12/12/2017   CL 102 12/12/2017   CALCIUM 9.7 12/12/2017                        Neuropathy Markers Lab Results  Component Value Date   HIV Non Reactive 12/06/2017                        CNS Tests No results found for: COLORCSF, APPEARCSF, RBCCOUNTCSF, WBCCSF, POLYSCSF, LYMPHSCSF, EOSCSF, PROTEINCSF, GLUCCSF, JCVIRUS, CSFOLI, IGGCSF                      Bone Pathology Markers No results found for: VD25OH, H139778, [id number], [id number], 25OHVITD1, 25OHVITD2, 25OHVITD3, TESTOFREE, TESTOSTERONE                       Coagulation Parameters Lab Results  Component Value Date   INR 0.95 12/06/2017   LABPROT 12.6 12/06/2017   APTT 29 12/06/2017   PLT 204 12/12/2017                        Cardiovascular Markers Lab Results  Component Value Date   BNP 13.0 12/06/2017   TROPONINI 0.03 (HH) 12/07/2017   HGB 15.0 12/12/2017   HCT 44.3 12/12/2017                         CA Markers No results found for: CEA, CA125, LABCA2                      Note: Lab results reviewed.  Recent Diagnostic Imaging Results  CT HEMATURIA WORKUP CLINICAL DATA:  Episodes of gross hematuria.  EXAM: CT ABDOMEN AND PELVIS WITHOUT AND WITH CONTRAST  TECHNIQUE: Multidetector CT imaging of the abdomen and pelvis was performed following the standard protocol before and following the bolus administration of intravenous  contrast.  CONTRAST:  163m ISOVUE-300 IOPAMIDOL (ISOVUE-300) INJECTION 61%  COMPARISON:  CT scan 04/29/2017  FINDINGS: Lower chest: The lung bases are clear of acute process. No pleural effusion or pulmonary lesions. The heart is normal in size. No pericardial effusion. Coronary artery calcifications are noted. The distal esophagus and aorta are unremarkable.  Hepatobiliary: No focal hepatic lesions or intrahepatic biliary dilatation. The gallbladder is normal. No common bile duct dilatation.  Pancreas: No mass, inflammation or ductal dilatation.  Spleen: Normal size.  No focal lesions.  Adrenals/Urinary Tract: Adrenal glands are unremarkable.  No renal, ureteral or bladder calculi. Both kidneys demonstrate normal enhancement/perfusion. No worrisome renal lesions. There is a stable 2 cm cyst in the midpole region right kidney. The delayed images do not demonstrate any significant collecting system abnormalities. Both ureters are normal. The bladder is normal.  Stomach/Bowel: The stomach, duodenum, small bowel and colon are grossly normal without oral contrast. No inflammatory changes, mass lesions or obstructive findings. The terminal ileum and appendix are normal.  Vascular/Lymphatic: Advanced atherosclerotic calcifications involving the aorta and iliac arteries but no focal aneurysm  or dissection. The branch vessels are patent. The major venous structures are patent. No mesenteric or retroperitoneal mass or adenopathy.  Reproductive: The prostate gland and seminal vesicles are unremarkable.  Other: No pelvic mass or adenopathy. No free pelvic fluid collections. No inguinal mass or adenopathy. No abdominal wall hernia or subcutaneous lesions.  Musculoskeletal: A right femoral rod is noted. Both hips are normally located. Evidence of prior left iliac bone trauma with areas of heterotopic ossification.  Stable lumbar fusion hardware.  IMPRESSION: 1. No CT findings  to account for the patient's hematuria. No renal, ureteral or bladder calculi or mass. Stable right renal cyst. 2. No acute abdominal/pelvic findings, mass lesions or adenopathy. 3. Stable age advanced atherosclerotic calcifications involving the abdominal aorta and iliac arteries. Coronary artery calcifications are also noted  Electronically Signed   By: Marijo Sanes M.D.   On: 01/23/2018 15:57  Complexity Note: Imaging results reviewed. Results shared with Wesley Adams, using Layman's terms.                         Meds   Current Outpatient Medications:  .  acetaminophen (TYLENOL) 325 MG tablet, Take 2 tablets (650 mg total) by mouth every 6 (six) hours as needed for mild pain (or Fever >/= 101)., Disp: , Rfl:  .  aspirin EC 81 MG EC tablet, Take 1 tablet (81 mg total) by mouth daily., Disp: , Rfl:  .  atorvastatin (LIPITOR) 40 MG tablet, Take 1 tablet (40 mg total) by mouth daily., Disp: 90 tablet, Rfl: 1 .  gabapentin (NEURONTIN) 300 MG capsule, Take 1 capsule (300 mg total) by mouth 3 (three) times daily. At bedtime, Disp: 90 capsule, Rfl: 0  ROS  Constitutional: Denies any fever or chills Gastrointestinal: No reported hemesis, hematochezia, vomiting, or acute GI distress Musculoskeletal: Denies any acute onset joint swelling, redness, loss of ROM, or weakness Neurological: No reported episodes of acute onset apraxia, aphasia, dysarthria, agnosia, amnesia, paralysis, loss of coordination, or loss of consciousness  Allergies  Wesley Adams has No Known Allergies.  Wesley Adams  Drug: Wesley Adams  reports no history of drug use. Alcohol:  reports previous alcohol use. Tobacco:  reports that he has been smoking cigarettes. He has been smoking about 0.00 packs per day for the past 30.00 years. He has never used smokeless tobacco. Medical:  has a past medical history of Chronic back pain and Flu. Surgical: Wesley Adams  has a past surgical history that includes Back surgery (Feb 2016);  Spinal cord stimulator insertion (03/23/2017); Colonoscopy with propofol (N/A, 06/01/2017); Esophagogastroduodenoscopy (egd) with propofol (06/01/2017); polypectomy (06/01/2017); Spinal cord stimulator removal (N/A, 06/16/2017); Fracture surgery (Right, as child); and LEFT HEART CATH AND CORONARY ANGIOGRAPHY (N/A, 12/08/2017). Family: family history includes Alcohol abuse in his mother; Heart attack in his father and paternal grandfather; Heart disease in his father; Obesity in his father.  Constitutional Exam  General appearance: Well nourished, well developed, and well hydrated. In no apparent acute distress Vitals:   04/13/18 0826  BP: (!) 152/93  Pulse: 72  Temp: 98 F (36.7 C)  SpO2: 100%  Weight: 205 lb (93 kg)  Height: 6' 1"  (1.854 m)  Psych/Mental status: Alert, oriented x 3 (person, place, & time)       Eyes: PERLA Respiratory: No evidence of acute respiratory distress  Lumbar Spine Area Exam  Skin & Axial Inspection: Well healed scar from previous spine surgery detected Alignment: Symmetrical Functional ROM: Unrestricted ROM  Stability: No instability detected Muscle Tone/Strength: Increased muscle tone over affected area Sensory (Neurological): Unimpaired Palpation: Complains of area being tender to palpation       Provocative Tests: Hyperextension/rotation test: Positive bilaterally for facet joint pain. Lumbar quadrant test (Kemp's test): deferred today       Lateral bending test: deferred today       Patrick's Maneuver: deferred today                    Gait & Posture Assessment  Ambulation: Unassisted Gait: Relatively normal for age and body habitus Posture: WNL   Lower Extremity Exam    Side: Right lower extremity  Side: Left lower extremity  Stability: No instability observed          Stability: No instability observed          Skin & Extremity Inspection: Skin color, temperature, and hair growth are WNL. No peripheral edema or cyanosis. No masses, redness,  swelling, asymmetry, or associated skin lesions. No contractures.  Skin & Extremity Inspection: Skin color, temperature, and hair growth are WNL. No peripheral edema or cyanosis. No masses, redness, swelling, asymmetry, or associated skin lesions. No contractures.  Functional ROM: Unrestricted ROM                  Functional ROM: Unrestricted ROM                  Muscle Tone/Strength: Functionally intact. No obvious neuro-muscular anomalies detected.  Muscle Tone/Strength: Functionally intact. No obvious neuro-muscular anomalies detected.  Sensory (Neurological): Dermatomal pain pattern        Sensory (Neurological): Unimpaired        Palpation: No palpable anomalies  Palpation: No palpable anomalies   Assessment  Primary Diagnosis & Pertinent Problem List: The primary encounter diagnosis was Chronic bilateral low back pain with bilateral sciatica. Diagnoses of Lumbar degenerative disc disease, Chronic pain syndrome, and Long term current use of opiate analgesic were also pertinent to this visit.  Status Diagnosis  Worsening Persistent Persistent 1. Chronic bilateral low back pain with bilateral sciatica   2. Lumbar degenerative disc disease   3. Chronic pain syndrome   4. Long term current use of opiate analgesic     Problems updated and reviewed during this visit: No problems updated. Plan of Care  Pharmacotherapy (Medications Ordered): No orders of the defined types were placed in this encounter.  New Prescriptions   No medications on file   Medications administered today: Antonia Culbertson. [place] had no medications administered during this visit. Lab-work, procedure(s), and/or referral(s): Orders Placed This Encounter  Procedures  . ToxASSURE Select 13 (MW), Urine   Imaging and/or referral(s): None  Interventional therapies: Planned, scheduled, and/or pending:   Not at this time.   Provider-requested follow-up: Return in about 4 weeks (around 05/11/2018) for evaluation  only.  No future appointments. Primary Care Physician: Steele Sizer, MD Location: [facility] Facility Note by: Vevelyn Francois NP Date: 04/13/2018; Time: 10:29 AM  Pain Score Disclaimer: We use the NRS-11 scale. This is a self-reported, subjective measurement of pain severity with only modest accuracy. It is used primarily to identify changes within a particular patient. It must be understood that outpatient pain scales are significantly less accurate that those used for research, where they can be applied under ideal controlled circumstances with minimal exposure to variables. In reality, the score is likely to be a combination of pain intensity and pain affect, where pain  affect describes the degree of emotional arousal or changes in action readiness caused by the sensory experience of pain. Factors such as social and work situation, setting, emotional state, anxiety levels, expectation, and prior pain experience may influence pain perception and show large inter-individual differences that may also be affected by time variables.  Patient instructions provided during this appointment: Patient Instructions    ______________________________________________________________________________________________  Specialty Pain Scale  Introduction:  There are significant differences in how pain is reported. The word pain usually refers to physical pain, but it is also a common synonym of suffering. The medical community uses a scale from 0 (zero) to 10 (ten) to report pain level. Zero (0) is described as "no pain", while ten (10) is described as "the worse pain you can imagine". The problem with this scale is that physical pain is reported along with suffering. Suffering refers to mental pain, or more often yet it refers to any unpleasant feeling, emotion or aversion associated with the perception of harm or threat of harm. It is the psychological component of pain.  Pain  Specialists prefer to separate the two components. The pain scale used by this practice is the Verbal Numerical Rating Scale (VNRS-11). This scale is for the physical pain only. DO NOT INCLUDE how your pain psychologically affects you. This scale is for adults 61 years of age and older. It has 11 (eleven) levels. The 1st level is 0/10. This means: "right now, I have no pain". In the context of pain management, it also means: "right now, my physical pain is under control with the current therapy".  General Information:  The scale should reflect your current level of pain. Unless you are specifically asked for the level of your worst pain, or your average pain. If you are asked for one of these two, then it should be understood that it is over the past 24 hours.  Levels 1 (one) through 5 (five) are described below, and can be treated as an outpatient. Ambulatory pain management facilities such as ours are more than adequate to treat these levels. Levels 6 (six) through 10 (ten) are also described below, however, these must be treated as a hospitalized patient. While levels 6 (six) and 7 (seven) may be evaluated at an urgent care facility, levels 8 (eight) through 10 (ten) constitute medical emergencies and as such, they belong in a hospital's emergency department. When having these levels (as described below), do not come to our office. Our facility is not equipped to manage these levels. Go directly to an urgent care facility or an emergency department to be evaluated.  Definitions:  Activities of Daily Living (ADL): Activities of daily living (ADL or ADLs) is a term used in healthcare to refer to people's daily self-care activities. Health professionals often use a person's ability or inability to perform ADLs as a measurement of their functional status, particularly in regard to people post injury, with disabilities and the elderly. There are two ADL levels: Basic and Instrumental. Basic Activities of  Daily Living (BADL  or BADLs) consist of self-care tasks that include: Bathing and showering; personal hygiene and grooming (including brushing/combing/styling hair); dressing; Toilet hygiene (getting to the toilet, cleaning oneself, and getting back up); eating and self-feeding (not including cooking or chewing and swallowing); functional mobility, often referred to as "transferring", as measured by the ability to walk, get in and out of bed, and get into and out of a chair; the broader definition (moving from one place to another while  performing activities) is useful for people with different physical abilities who are still able to get around independently. Basic ADLs include the things many people do when they get up in the morning and get ready to go out of the house: get out of bed, go to the toilet, bathe, dress, groom, and eat. On the average, loss of function typically follows a particular order. Hygiene is the first to go, followed by loss of toilet use and locomotion. The last to go is the ability to eat. When there is only one remaining area in which the person is independent, there is a 62.9% chance that it is eating and only a 3.5% chance that it is hygiene. Instrumental Activities of Daily Living (IADL or IADLs) are not necessary for fundamental functioning, but they let an individual live independently in a community. IADL consist of tasks that include: cleaning and maintaining the house; home establishment and maintenance; care of others (including selecting and supervising caregivers); care of pets; child rearing; managing money; managing financials (investments, etc.); meal preparation and cleanup; shopping for groceries and necessities; moving within the community; safety procedures and emergency responses; health management and maintenance (taking prescribed medications); and using the telephone or other form of communication.  Instructions:  Most patients tend to report their pain as a  combination of two factors, their physical pain and their psychosocial pain. This last one is also known as "suffering" and it is reflection of how physical pain affects you socially and psychologically. From now on, report them separately.  From this point on, when asked to report your pain level, report only your physical pain. Use the following table for reference.  Pain Clinic Pain Levels (0-5/10)  Pain Level Score  Description  No Pain 0   Mild pain 1 Nagging, annoying, but does not interfere with basic activities of daily living (ADL). Patients are able to eat, bathe, get dressed, toileting (being able to get on and off the toilet and perform personal hygiene functions), transfer (move in and out of bed or a chair without assistance), and maintain continence (able to control bladder and bowel functions). Blood pressure and heart rate are unaffected. A normal heart rate for a healthy adult ranges from 60 to 100 bpm (beats per minute).   Mild to moderate pain 2 Noticeable and distracting. Impossible to hide from other people. More frequent flare-ups. Still possible to adapt and function close to normal. It can be very annoying and may have occasional stronger flare-ups. With discipline, patients may get used to it and adapt.   Moderate pain 3 Interferes significantly with activities of daily living (ADL). It becomes difficult to feed, bathe, get dressed, get on and off the toilet or to perform personal hygiene functions. Difficult to get in and out of bed or a chair without assistance. Very distracting. With effort, it can be ignored when deeply involved in activities.   Moderately severe pain 4 Impossible to ignore for more than a few minutes. With effort, patients may still be able to manage work or participate in some social activities. Very difficult to concentrate. Signs of autonomic nervous system discharge are evident: dilated pupils (mydriasis); mild sweating (diaphoresis); sleep  interference. Heart rate becomes elevated (>115 bpm). Diastolic blood pressure (lower number) rises above 100 mmHg. Patients find relief in laying down and not moving.   Severe pain 5 Intense and extremely unpleasant. Associated with frowning face and frequent crying. Pain overwhelms the senses.  Ability to do any activity or  maintain social relationships becomes significantly limited. Conversation becomes difficult. Pacing back and forth is common, as getting into a comfortable position is nearly impossible. Pain wakes you up from deep sleep. Physical signs will be obvious: pupillary dilation; increased sweating; goosebumps; brisk reflexes; cold, clammy hands and feet; nausea, vomiting or dry heaves; loss of appetite; significant sleep disturbance with inability to fall asleep or to remain asleep. When persistent, significant weight loss is observed due to the complete loss of appetite and sleep deprivation.  Blood pressure and heart rate becomes significantly elevated. Caution: If elevated blood pressure triggers a pounding headache associated with blurred vision, then the patient should immediately seek attention at an urgent or emergency care unit, as these may be signs of an impending stroke.    Emergency Department Pain Levels (6-10/10)  Emergency Room Pain 6 Severely limiting. Requires emergency care and should not be seen or managed at an outpatient pain management facility. Communication becomes difficult and requires great effort. Assistance to reach the emergency department may be required. Facial flushing and profuse sweating along with potentially dangerous increases in heart rate and blood pressure will be evident.   Distressing pain 7 Self-care is very difficult. Assistance is required to transport, or use restroom. Assistance to reach the emergency department will be required. Tasks requiring coordination, such as bathing and getting dressed become very difficult.   Disabling pain 8  Self-care is no longer possible. At this level, pain is disabling. The individual is unable to do even the most "basic" activities such as walking, eating, bathing, dressing, transferring to a bed, or toileting. Fine motor skills are lost. It is difficult to think clearly.   Incapacitating pain 9 Pain becomes incapacitating. Thought processing is no longer possible. Difficult to remember your own name. Control of movement and coordination are lost.   The worst pain imaginable 10 At this level, most patients pass out from pain. When this level is reached, collapse of the autonomic nervous system occurs, leading to a sudden drop in blood pressure and heart rate. This in turn results in a temporary and dramatic drop in blood flow to the brain, leading to a loss of consciousness. Fainting is one of the body's self defense mechanisms. Passing out puts the brain in a calmed state and causes it to shut down for a while, in order to begin the healing process.    Summary: 1. Refer to this scale when providing [place] with your pain level. 2. Be accurate and careful when reporting your pain level. This will help with your care. 3. Over-reporting your pain level will lead to loss of credibility. 4. Even a level of 1/10 means that there is pain and will be treated at our facility. 5. High, inaccurate reporting will be documented as "Symptom Exaggeration", leading to loss of credibility and suspicions of possible secondary gains such as obtaining more narcotics, or wanting to appear disabled, for fraudulent reasons. 6. Only pain levels of 5 or below will be seen at our facility. 7. Pain levels of 6 and above will be sent to the Emergency Department and the appointment cancelled. ______________________________________________________________________________________________    BMI Assessment: Estimated body mass index is 27.05 kg/m as calculated from the following:   Height as of this encounter: 6' 1"  (1.854 m).    Weight as of this encounter: 205 lb (93 kg).  BMI interpretation table: BMI level Category Range association with higher incidence of chronic pain  <18 kg/m2 Underweight   18.5-24.9 kg/m2 Ideal body  weight   25-29.9 kg/m2 Overweight Increased incidence by 20%  30-34.9 kg/m2 Obese (Class I) Increased incidence by 68%  35-39.9 kg/m2 Severe obesity (Class II) Increased incidence by 136%  >40 kg/m2 Extreme obesity (Class III) Increased incidence by 254%   Patient's current BMI Ideal Body weight  Body mass index is 27.05 kg/m. Ideal body weight: 79.9 kg (176 lb 2.4 oz) Adjusted ideal body weight: 85.1 kg (187 lb 11 oz)   BMI Readings from Last 4 Encounters:  04/13/18 27.05 kg/m  02/21/18 27.71 kg/m  01/24/18 27.76 kg/m  01/24/18 27.71 kg/m   Wt Readings from Last 4 Encounters:  04/13/18 205 lb (93 kg)  02/21/18 210 lb (95.3 kg)  01/24/18 210 lb 6.4 oz (95.4 kg)  01/24/18 210 lb (95.3 kg)

## 2018-04-13 NOTE — Patient Instructions (Addendum)
______________________________________________________________________________________________  Specialty Pain Scale  Introduction:  There are significant differences in how pain is reported. The word pain usually refers to physical pain, but it is also a common synonym of suffering. The medical community uses a scale from 0 (zero) to 10 (ten) to report pain level. Zero (0) is described as "no pain", while ten (10) is described as "the worse pain you can imagine". The problem with this scale is that physical pain is reported along with suffering. Suffering refers to mental pain, or more often yet it refers to any unpleasant feeling, emotion or aversion associated with the perception of harm or threat of harm. It is the psychological component of pain.  Pain Specialists prefer to separate the two components. The pain scale used by this practice is the Verbal Numerical Rating Scale (VNRS-11). This scale is for the physical pain only. DO NOT INCLUDE how your pain psychologically affects you. This scale is for adults 21 years of age and older. It has 11 (eleven) levels. The 1st level is 0/10. This means: "right now, I have no pain". In the context of pain management, it also means: "right now, my physical pain is under control with the current therapy".  General Information:  The scale should reflect your current level of pain. Unless you are specifically asked for the level of your worst pain, or your average pain. If you are asked for one of these two, then it should be understood that it is over the past 24 hours.  Levels 1 (one) through 5 (five) are described below, and can be treated as an outpatient. Ambulatory pain management facilities such as ours are more than adequate to treat these levels. Levels 6 (six) through 10 (ten) are also described below, however, these must be treated as a hospitalized patient. While levels 6 (six) and 7 (seven) may be evaluated at an urgent care facility, levels 8  (eight) through 10 (ten) constitute medical emergencies and as such, they belong in a hospital's emergency department. When having these levels (as described below), do not come to our office. Our facility is not equipped to manage these levels. Go directly to an urgent care facility or an emergency department to be evaluated.  Definitions:  Activities of Daily Living (ADL): Activities of daily living (ADL or ADLs) is a term used in healthcare to refer to people's daily self-care activities. Health professionals often use a person's ability or inability to perform ADLs as a measurement of their functional status, particularly in regard to people post injury, with disabilities and the elderly. There are two ADL levels: Basic and Instrumental. Basic Activities of Daily Living (BADL  or BADLs) consist of self-care tasks that include: Bathing and showering; personal hygiene and grooming (including brushing/combing/styling hair); dressing; Toilet hygiene (getting to the toilet, cleaning oneself, and getting back up); eating and self-feeding (not including cooking or chewing and swallowing); functional mobility, often referred to as "transferring", as measured by the ability to walk, get in and out of bed, and get into and out of a chair; the broader definition (moving from one place to another while performing activities) is useful for people with different physical abilities who are still able to get around independently. Basic ADLs include the things many people do when they get up in the morning and get ready to go out of the house: get out of bed, go to the toilet, bathe, dress, groom, and eat. On the average, loss of function typically follows a particular order.   Hygiene is the first to go, followed by loss of toilet use and locomotion. The last to go is the ability to eat. When there is only one remaining area in which the person is independent, there is a 62.9% chance that it is eating and only a 3.5% chance  that it is hygiene. Instrumental Activities of Daily Living (IADL or IADLs) are not necessary for fundamental functioning, but they let an individual live independently in a community. IADL consist of tasks that include: cleaning and maintaining the house; home establishment and maintenance; care of others (including selecting and supervising caregivers); care of pets; child rearing; managing money; managing financials (investments, etc.); meal preparation and cleanup; shopping for groceries and necessities; moving within the community; safety procedures and emergency responses; health management and maintenance (taking prescribed medications); and using the telephone or other form of communication.  Instructions:  Most patients tend to report their pain as a combination of two factors, their physical pain and their psychosocial pain. This last one is also known as "suffering" and it is reflection of how physical pain affects you socially and psychologically. From now on, report them separately.  From this point on, when asked to report your pain level, report only your physical pain. Use the following table for reference.  Pain Clinic Pain Levels (0-5/10)  Pain Level Score  Description  No Pain 0   Mild pain 1 Nagging, annoying, but does not interfere with basic activities of daily living (ADL). Patients are able to eat, bathe, get dressed, toileting (being able to get on and off the toilet and perform personal hygiene functions), transfer (move in and out of bed or a chair without assistance), and maintain continence (able to control bladder and bowel functions). Blood pressure and heart rate are unaffected. A normal heart rate for a healthy adult ranges from 60 to 100 bpm (beats per minute).   Mild to moderate pain 2 Noticeable and distracting. Impossible to hide from other people. More frequent flare-ups. Still possible to adapt and function close to normal. It can be very annoying and may have  occasional stronger flare-ups. With discipline, patients may get used to it and adapt.   Moderate pain 3 Interferes significantly with activities of daily living (ADL). It becomes difficult to feed, bathe, get dressed, get on and off the toilet or to perform personal hygiene functions. Difficult to get in and out of bed or a chair without assistance. Very distracting. With effort, it can be ignored when deeply involved in activities.   Moderately severe pain 4 Impossible to ignore for more than a few minutes. With effort, patients may still be able to manage work or participate in some social activities. Very difficult to concentrate. Signs of autonomic nervous system discharge are evident: dilated pupils (mydriasis); mild sweating (diaphoresis); sleep interference. Heart rate becomes elevated (>115 bpm). Diastolic blood pressure (lower number) rises above 100 mmHg. Patients find relief in laying down and not moving.   Severe pain 5 Intense and extremely unpleasant. Associated with frowning face and frequent crying. Pain overwhelms the senses.  Ability to do any activity or maintain social relationships becomes significantly limited. Conversation becomes difficult. Pacing back and forth is common, as getting into a comfortable position is nearly impossible. Pain wakes you up from deep sleep. Physical signs will be obvious: pupillary dilation; increased sweating; goosebumps; brisk reflexes; cold, clammy hands and feet; nausea, vomiting or dry heaves; loss of appetite; significant sleep disturbance with inability to fall asleep or to   remain asleep. When persistent, significant weight loss is observed due to the complete loss of appetite and sleep deprivation.  Blood pressure and heart rate becomes significantly elevated. Caution: If elevated blood pressure triggers a pounding headache associated with blurred vision, then the patient should immediately seek attention at an urgent or emergency care unit, as  these may be signs of an impending stroke.    Emergency Department Pain Levels (6-10/10)  Emergency Room Pain 6 Severely limiting. Requires emergency care and should not be seen or managed at an outpatient pain management facility. Communication becomes difficult and requires great effort. Assistance to reach the emergency department may be required. Facial flushing and profuse sweating along with potentially dangerous increases in heart rate and blood pressure will be evident.   Distressing pain 7 Self-care is very difficult. Assistance is required to transport, or use restroom. Assistance to reach the emergency department will be required. Tasks requiring coordination, such as bathing and getting dressed become very difficult.   Disabling pain 8 Self-care is no longer possible. At this level, pain is disabling. The individual is unable to do even the most "basic" activities such as walking, eating, bathing, dressing, transferring to a bed, or toileting. Fine motor skills are lost. It is difficult to think clearly.   Incapacitating pain 9 Pain becomes incapacitating. Thought processing is no longer possible. Difficult to remember your own name. Control of movement and coordination are lost.   The worst pain imaginable 10 At this level, most patients pass out from pain. When this level is reached, collapse of the autonomic nervous system occurs, leading to a sudden drop in blood pressure and heart rate. This in turn results in a temporary and dramatic drop in blood flow to the brain, leading to a loss of consciousness. Fainting is one of the body's self defense mechanisms. Passing out puts the brain in a calmed state and causes it to shut down for a while, in order to begin the healing process.    Summary: 1. Refer to this scale when providing [place] with your pain level. 2. Be accurate and careful when reporting your pain level. This will help with your care. 3. Over-reporting your pain level will  lead to loss of credibility. 4. Even a level of 1/10 means that there is pain and will be treated at our facility. 5. High, inaccurate reporting will be documented as "Symptom Exaggeration", leading to loss of credibility and suspicions of possible secondary gains such as obtaining more narcotics, or wanting to appear disabled, for fraudulent reasons. 6. Only pain levels of 5 or below will be seen at our facility. 7. Pain levels of 6 and above will be sent to the Emergency Department and the appointment cancelled. ______________________________________________________________________________________________    BMI Assessment: Estimated body mass index is 27.05 kg/m as calculated from the following:   Height as of this encounter: 6\' 1"  (1.854 m).   Weight as of this encounter: 205 lb (93 kg).  BMI interpretation table: BMI level Category Range association with higher incidence of chronic pain  <18 kg/m2 Underweight   18.5-24.9 kg/m2 Ideal body weight   25-29.9 kg/m2 Overweight Increased incidence by 20%  30-34.9 kg/m2 Obese (Class I) Increased incidence by 68%  35-39.9 kg/m2 Severe obesity (Class II) Increased incidence by 136%  >40 kg/m2 Extreme obesity (Class III) Increased incidence by 254%   Patient's current BMI Ideal Body weight  Body mass index is 27.05 kg/m. Ideal body weight: 79.9 kg (176 lb 2.4 oz)  Adjusted ideal body weight: 85.1 kg (187 lb 11 oz)   BMI Readings from Last 4 Encounters:  04/13/18 27.05 kg/m  02/21/18 27.71 kg/m  01/24/18 27.76 kg/m  01/24/18 27.71 kg/m   Wt Readings from Last 4 Encounters:  04/13/18 205 lb (93 kg)  02/21/18 210 lb (95.3 kg)  01/24/18 210 lb 6.4 oz (95.4 kg)  01/24/18 210 lb (95.3 kg)

## 2018-04-13 NOTE — Progress Notes (Signed)
Nursing Pain Medication Assessment:  Safety precautions to be maintained throughout the outpatient stay will include: orient to surroundings, keep bed in low position, maintain call bell within reach at all times, provide assistance with transfer out of bed and ambulation.  Medication Inspection Compliance: Pill count conducted under aseptic conditions, in front of the patient. Neither the pills nor the bottle was removed from the patient's sight at any time. Once count was completed pills were immediately returned to the patient in their original bottle.  Medication: Hydrocodone/APAP Pill/Patch Count: 0 of 120 pills remain Pill/Patch Appearance: Markings consistent with prescribed medication Bottle Appearance: Standard pharmacy container. Clearly labeled. Filled Date: 60 / 10 / 2019 Last Medication intake:  2 weeks ago

## 2018-04-17 ENCOUNTER — Telehealth: Payer: Self-pay | Admitting: *Deleted

## 2018-04-17 NOTE — Telephone Encounter (Signed)
Talked with patient and reviewed his last visit where it was indicated that he had not given a UDS at his last visit and the one that was collected on 04/13/18 indicated low temperature.  I explained to the patient that [facility] will not call in any medication until he has a good UDS.  I told him we would be back in the office on 04/20/18 and he could check with [place] then about Rx.

## 2018-04-18 LAB — TOXASSURE SELECT 13 (MW), URINE

## 2018-04-20 ENCOUNTER — Telehealth: Payer: Self-pay | Admitting: *Deleted

## 2018-04-20 NOTE — Telephone Encounter (Signed)
Phone message left that patient is wanting to know if urine has come back and would like to know if his medication can be called in, Hydrocodone - apap 10-325.  His UDS is back and there are no drugs detected in his urine.

## 2018-04-21 NOTE — Telephone Encounter (Signed)
He needs an apt with [facility]

## 2018-05-03 ENCOUNTER — Encounter: Payer: Self-pay | Admitting: Nurse Practitioner

## 2018-05-03 ENCOUNTER — Ambulatory Visit: Payer: BLUE CROSS/BLUE SHIELD | Attending: Nurse Practitioner | Admitting: Nurse Practitioner

## 2018-05-03 VITALS — BP 151/87 | HR 71 | Temp 97.7°F | Resp 16 | Ht 73.0 in | Wt 205.0 lb

## 2018-05-03 DIAGNOSIS — G8929 Other chronic pain: Secondary | ICD-10-CM | POA: Insufficient documentation

## 2018-05-03 DIAGNOSIS — M79604 Pain in right leg: Secondary | ICD-10-CM

## 2018-05-03 DIAGNOSIS — M47816 Spondylosis without myelopathy or radiculopathy, lumbar region: Secondary | ICD-10-CM | POA: Insufficient documentation

## 2018-05-03 DIAGNOSIS — M5136 Other intervertebral disc degeneration, lumbar region: Secondary | ICD-10-CM | POA: Diagnosis not present

## 2018-05-03 DIAGNOSIS — G894 Chronic pain syndrome: Secondary | ICD-10-CM | POA: Diagnosis present

## 2018-05-03 DIAGNOSIS — M5442 Lumbago with sciatica, left side: Secondary | ICD-10-CM | POA: Diagnosis present

## 2018-05-03 DIAGNOSIS — Z79891 Long term (current) use of opiate analgesic: Secondary | ICD-10-CM | POA: Diagnosis present

## 2018-05-03 DIAGNOSIS — M5441 Lumbago with sciatica, right side: Secondary | ICD-10-CM

## 2018-05-03 MED ORDER — HYDROCODONE-ACETAMINOPHEN 10-325 MG PO TABS: 1.0000 | ORAL_TABLET | Freq: Two times a day (BID) | ORAL | 0 refills | Status: DC | PRN

## 2018-05-03 MED ORDER — TIZANIDINE HCL 4 MG PO TABS: 4.0000 mg | ORAL_TABLET | Freq: Three times a day (TID) | ORAL | 0 refills | Status: DC | PRN

## 2018-05-03 MED ORDER — GABAPENTIN 300 MG PO CAPS: 300.0000 mg | ORAL_CAPSULE | Freq: Three times a day (TID) | ORAL | 0 refills | Status: DC

## 2018-05-03 MED ORDER — HYDROCODONE-ACETAMINOPHEN 10-325 MG PO TABS: 1.0000 | ORAL_TABLET | Freq: Two times a day (BID) | ORAL | 0 refills | Status: DC

## 2018-05-03 NOTE — Progress Notes (Signed)
Patient's Name: Wesley Adams  MRN: [id number]  Referring Provider: Steele Sizer, MD  DOB: January 28, 1966  PCP: Steele Sizer, MD  DOS: 05/03/2018  Note by: Vevelyn Francois NP  Service setting: Ambulatory outpatient  Specialty: Interventional Pain Management  Location: [facility] (AMB) Pain Management Facility    Patient type: Established    Primary Reason(s) for Visit: Encounter for prescription drug management. (Level of risk: moderate)  CC: Back Pain (lower right )  HPI  [name] is a 53 y.o. year old, male patient, who comes today for a medication management evaluation. He has Lumbar degenerative disc disease; Acute low back pain without sciatica; Spinal cord stimulator status; Chronic, continuous use of opioids; Tobacco use; Epigastric pain; Special screening for malignant neoplasms, colon; Chronic bilateral low back pain with bilateral sciatica; Chronic pain syndrome; Opiate use; Long term current use of opiate analgesic; Chest pain; Iliac artery stenosis, left ([place]); Atherosclerosis of aorta ([place]); Coronary artery disease involving native heart; Lumbar spondylosis; and Chronic pain of right lower extremity on their problem list. His primarily concern today is the Back Pain (lower right )  Pain Assessment: Location: Lower, Right Back Radiating: down the right leg to approx the knee Onset: More than a month ago Duration: Chronic pain Quality: Aching, Constant, Discomfort Severity: 9 /10 (subjective, self-reported pain score)  Note: Reported level is compatible with observation. Clinically the patient looks like a 2/10 A 2/10 is viewed as "Mild to Moderate" and described as noticeable and distracting. Impossible to hide from other people. More frequent flare-ups. Still possible to adapt and function close to normal. It can be very annoying and may have occasional stronger flare-ups. With discipline, patients may get used to it and adapt. Information on the proper use of the pain scale  provided to the patient today. When using our objective Pain Scale, levels between 6 and 10/10 are said to belong in an emergency room, as it progressively worsens from a 6/10, described as severely limiting, requiring emergency care not usually available at an outpatient pain management facility. At a 6/10 level, communication becomes difficult and requires great effort. Assistance to reach the emergency department may be required. Facial flushing and profuse sweating along with potentially dangerous increases in heart rate and blood pressure will be evident. Effect on ADL: worse in the morning upon rising  Timing: Constant Modifying factors: nothing currently BP: (!) 151/87  HR: 71  [name] was last scheduled for an appointment on 04/13/2018 for medication management. During today's appointment we reviewed [name] chronic pain status, as well as his outpatient medication regimen. He admits that he back pain is worse.  He has been out of pain medication however this was secondary to noncompliance with his urine drug screens. He admits that it is locking up on him.  He feels like his pain is the same as it was prior to his previous laminectomy.  He does have the numbness tingling.  He denies any weakness.  He denies any accidents or any recent injury.  He does continue to try to work.  He did have FMLA papers completed recently.  He was given a referral for [facility] neurosurgery.  He has an appointment on January 27 for consultation and MRI.  He would like to try to have the MRI completed prior to the appointment so that he will not have to take off to return for this.  The patient  reports no history of drug use. His body mass index is 27.05 kg/m.  Further details on both, my assessment(s), as well as the proposed treatment plan, please see below.  Controlled Substance Pharmacotherapy Assessment REMS (Risk Evaluation and Mitigation Strategy)  Analgesic:Hydrocodone /APAP 10/'325mg'$   QID MME/day:'40mg'$ /day Janett Billow, RN  05/03/2018  8:12 AM  Sign when Signing Visit Safety precautions to be maintained throughout the outpatient stay will include: orient to surroundings, keep bed in low position, maintain call bell within reach at all times, provide assistance with transfer out of bed and ambulation.    Pharmacokinetics: Liberation and absorption (onset of action): WNL Distribution (time to peak effect): WNL Metabolism and excretion (duration of action): WNL         Pharmacodynamics: Desired effects: Analgesia: Wesley Adams reports >50% benefit. Functional ability: Patient reports that medication allows him to accomplish basic ADLs Clinically meaningful improvement in function (CMIF): Sustained CMIF goals met Perceived effectiveness: Described as relatively effective, allowing for increase in activities of daily living (ADL) Undesirable effects: Side-effects or Adverse reactions: None reported Monitoring:  PMP: Online review of the past 71-monthperiod conducted. Compliant with practice rules and regulations Last UDS on record: Summary  Date Value Ref Range Status  04/13/2018 FINAL  Final    Comment:    ==================================================================== TOXASSURE SELECT 13 (MW) ==================================================================== Test                             Result       Flag       Units   NO DRUGS DETECTED. ==================================================================== Test                      Result    Flag   Units      Ref Range   Creatinine              222              mg/dL      >=20 ==================================================================== Declared Medications:  The flagging and interpretation on this report are based on the  following declared medications.  Unexpected results may arise from  inaccuracies in the declared medications.  **Note: The testing scope of this panel does not  include following  reported medications:  Acetaminophen  Aspirin (Aspirin 81)  Atorvastatin  Gabapentin ==================================================================== For clinical consultation, please call ([phone] ====================================================================    UDS interpretation: Unexpected findings:          Medication Assessment Form: Reviewed. Abnormalities discussed Treatment compliance: Deficiencies noted and steps taken to remind the patient of the seriousness of adequate therapy compliance Risk Assessment Profile: Aberrant behavior: See prior evaluations. None observed or detected today Comorbid factors increasing risk of overdose: age [id number] old, history of substance abuse and male gender Risk of substance use disorder (SUD): High  ORT Scoring interpretation table:  Score <3 = Low Risk for SUD  Score between 4-7 = Moderate Risk for SUD  Score >8 = High Risk for Opioid Abuse   Risk Mitigation Strategies:  Patient Counseling: Covered Patient-Prescriber Agreement (PPA): Present and active  Notification to other healthcare providers: Done  Pharmacologic Plan: Therapy adjustment:           Decrease in current regimen from QID to BID. Informed patient this because of his noncompliance with UDS knowing his risk factors. Will continue to monitor pt closely and hope the replacement of SCS will help decrease.   Laboratory Chemistry  Inflammation Markers (CRP: Acute Phase) (ESR: Chronic Phase)  No results found for: CRP, ESRSEDRATE, LATICACIDVEN                       Rheumatology Markers No results found for: RF, ANA, LABURIC, URICUR, LYMEIGGIGMAB, LYMEABIGMQN, HLAB27                      Renal Function Markers Lab Results  Component Value Date   BUN 12 01/02/2018   CREATININE 0.81 01/02/2018   BCR 15 01/02/2018   GFRAA 118 01/02/2018   GFRNONAA 102 01/02/2018                             Hepatic Function Markers Lab Results   Component Value Date   AST 24 12/06/2017   ALT 21 12/06/2017   ALBUMIN 4.4 12/06/2017   ALKPHOS 64 12/06/2017   LIPASE 41 12/06/2017                        Electrolytes Lab Results  Component Value Date   NA 138 12/12/2017   K 4.7 12/12/2017   CL 102 12/12/2017   CALCIUM 9.7 12/12/2017                        Neuropathy Markers Lab Results  Component Value Date   HIV Non Reactive 12/06/2017                        CNS Tests No results found for: COLORCSF, APPEARCSF, RBCCOUNTCSF, WBCCSF, POLYSCSF, LYMPHSCSF, EOSCSF, PROTEINCSF, GLUCCSF, JCVIRUS, CSFOLI, IGGCSF                      Bone Pathology Markers No results found for: VD25OH, H139778, [id number], [id number], 25OHVITD1, 25OHVITD2, 25OHVITD3, TESTOFREE, TESTOSTERONE                       Coagulation Parameters Lab Results  Component Value Date   INR 0.95 12/06/2017   LABPROT 12.6 12/06/2017   APTT 29 12/06/2017   PLT 204 12/12/2017                        Cardiovascular Markers Lab Results  Component Value Date   BNP 13.0 12/06/2017   TROPONINI 0.03 (HH) 12/07/2017   HGB 15.0 12/12/2017   HCT 44.3 12/12/2017                         CA Markers No results found for: CEA, CA125, LABCA2                      Note: Lab results reviewed.  Recent Diagnostic Imaging Results  CT HEMATURIA WORKUP CLINICAL DATA:  Episodes of gross hematuria.  EXAM: CT ABDOMEN AND PELVIS WITHOUT AND WITH CONTRAST  TECHNIQUE: Multidetector CT imaging of the abdomen and pelvis was performed following the standard protocol before and following the bolus administration of intravenous contrast.  CONTRAST:  124m ISOVUE-300 IOPAMIDOL (ISOVUE-300) INJECTION 61%  COMPARISON:  CT scan 04/29/2017  FINDINGS: Lower chest: The lung bases are clear of acute process. No pleural effusion or pulmonary lesions. The heart is normal in size. No pericardial effusion. Coronary artery calcifications are noted. The distal esophagus and aorta  are unremarkable.  Hepatobiliary: No focal hepatic lesions or intrahepatic biliary  dilatation. The gallbladder is normal. No common bile duct dilatation.  Pancreas: No mass, inflammation or ductal dilatation.  Spleen: Normal size.  No focal lesions.  Adrenals/Urinary Tract: Adrenal glands are unremarkable.  No renal, ureteral or bladder calculi. Both kidneys demonstrate normal enhancement/perfusion. No worrisome renal lesions. There is a stable 2 cm cyst in the midpole region right kidney. The delayed images do not demonstrate any significant collecting system abnormalities. Both ureters are normal. The bladder is normal.  Stomach/Bowel: The stomach, duodenum, small bowel and colon are grossly normal without oral contrast. No inflammatory changes, mass lesions or obstructive findings. The terminal ileum and appendix are normal.  Vascular/Lymphatic: Advanced atherosclerotic calcifications involving the aorta and iliac arteries but no focal aneurysm or dissection. The branch vessels are patent. The major venous structures are patent. No mesenteric or retroperitoneal mass or adenopathy.  Reproductive: The prostate gland and seminal vesicles are unremarkable.  Other: No pelvic mass or adenopathy. No free pelvic fluid collections. No inguinal mass or adenopathy. No abdominal wall hernia or subcutaneous lesions.  Musculoskeletal: A right femoral rod is noted. Both hips are normally located. Evidence of prior left iliac bone trauma with areas of heterotopic ossification.  Stable lumbar fusion hardware.  IMPRESSION: 1. No CT findings to account for the patient's hematuria. No renal, ureteral or bladder calculi or mass. Stable right renal cyst. 2. No acute abdominal/pelvic findings, mass lesions or adenopathy. 3. Stable age advanced atherosclerotic calcifications involving the abdominal aorta and iliac arteries. Coronary artery calcifications are also noted  Electronically  Signed   By: Marijo Sanes M.D.   On: 01/23/2018 15:57  Complexity Note: Imaging results reviewed. Results shared with Mr. Karge, using Layman's terms.                         Meds   Current Outpatient Medications:  .  acetaminophen (TYLENOL) 325 MG tablet, Take 2 tablets (650 mg total) by mouth every 6 (six) hours as needed for mild pain (or Fever >/= 101)., Disp: , Rfl:  .  aspirin EC 81 MG EC tablet, Take 1 tablet (81 mg total) by mouth daily., Disp: , Rfl:  .  atorvastatin (LIPITOR) 40 MG tablet, Take 1 tablet (40 mg total) by mouth daily., Disp: 90 tablet, Rfl: 1 .  gabapentin (NEURONTIN) 300 MG capsule, Take 1 capsule (300 mg total) by mouth 3 (three) times daily. At bedtime, Disp: 90 capsule, Rfl: 0 .  HYDROcodone-acetaminophen (NORCO) 10-325 MG tablet, Take 1 tablet by mouth every 12 (twelve) hours as needed for severe pain., Disp: 60 tablet, Rfl: 0 .  tiZANidine (ZANAFLEX) 4 MG tablet, Take 1 tablet (4 mg total) by mouth every 8 (eight) hours as needed for muscle spasms., Disp: 90 tablet, Rfl: 0  ROS  Constitutional: Denies any fever or chills Gastrointestinal: No reported hemesis, hematochezia, vomiting, or acute GI distress Musculoskeletal: Denies any acute onset joint swelling, redness, loss of ROM, or weakness Neurological: No reported episodes of acute onset apraxia, aphasia, dysarthria, agnosia, amnesia, paralysis, loss of coordination, or loss of consciousness  Allergies  Mr. Vanvranken has No Known Allergies.  [place]  Drug: Mr. Meenach  reports no history of drug use. Alcohol:  reports previous alcohol use. Tobacco:  reports that he has been smoking cigarettes. He has been smoking about 0.00 packs per day for the past 30.00 years. He has never used smokeless tobacco. Medical:  has a past medical history of Chronic back pain and Flu.  Surgical: Mr. Cosens  has a past surgical history that includes Back surgery (Feb 2016); Spinal cord stimulator insertion (03/23/2017);  Colonoscopy with propofol (N/A, 06/01/2017); Esophagogastroduodenoscopy (egd) with propofol (06/01/2017); polypectomy (06/01/2017); Spinal cord stimulator removal (N/A, 06/16/2017); Fracture surgery (Right, as child); and LEFT HEART CATH AND CORONARY ANGIOGRAPHY (N/A, 12/08/2017). Family: family history includes Alcohol abuse in his mother; Heart attack in his father and paternal grandfather; Heart disease in his father; Obesity in his father.  Constitutional Exam  General appearance: Well nourished, well developed, and well hydrated. In no apparent acute distress Vitals:   05/03/18 0812  BP: (!) 151/87  Pulse: 71  Resp: 16  Temp: 97.7 F (36.5 C)  TempSrc: Oral  SpO2: 100%  Weight: 205 lb (93 kg)  Height: _0  (1.854 m)  Psych/Mental status: Alert, oriented x 3 (person, place, & time)       Eyes: PERLA Respiratory: No evidence of acute respiratory distress  Lumbar Spine Area Exam  Skin & Axial Inspection: Well healed scar from previous spine surgery detected Alignment: Symmetrical Functional ROM: Unrestricted ROM       Stability: No instability detected Muscle Tone/Strength: Functionally intact. No obvious neuro-muscular anomalies detected. Sensory (Neurological): Unimpaired Palpation: Complains of area being tender to palpation       Provocative Tests: Hyperextension/rotation test: Positive       Lumbar quadrant test (Kemp's test): deferred today       Lateral bending test: (+)       Patrick's Maneuver: deferred today                    Gait & Posture Assessment  Ambulation: Unassisted Gait: Relatively normal for age and body habitus Posture: Tense   Lower Extremity Exam    Side: Right lower extremity  Side: Left lower extremity  Stability: No instability observed          Stability: No instability observed          Skin & Extremity Inspection: Skin color, temperature, and hair growth are WNL. No peripheral edema or cyanosis. No masses, redness, swelling, asymmetry, or associated  skin lesions. No contractures.  Skin & Extremity Inspection: Skin color, temperature, and hair growth are WNL. No peripheral edema or cyanosis. No masses, redness, swelling, asymmetry, or associated skin lesions. No contractures.  Functional ROM: Adequate ROM                  Functional ROM: Unrestricted ROM                  Muscle Tone/Strength: Functionally intact. No obvious neuro-muscular anomalies detected.  Muscle Tone/Strength: Functionally intact. No obvious neuro-muscular anomalies detected.  Sensory (Neurological): Dermatomal pain pattern        Sensory (Neurological): Unimpaired        Palpation: No palpable anomalies  Palpation: No palpable anomalies   Assessment  Primary Diagnosis & Pertinent Problem List: The primary encounter diagnosis was Chronic bilateral low back pain with bilateral sciatica. Diagnoses of Chronic pain of right lower extremity, Lumbar degenerative disc disease, Chronic pain syndrome, Long term current use of opiate analgesic, and Lumbar spondylosis were also pertinent to this visit.  Status Diagnosis  Worsening Worsening Controlled 1. Chronic bilateral low back pain with bilateral sciatica   2. Chronic pain of right lower extremity   3. Lumbar degenerative disc disease   4. Chronic pain syndrome   5. Long term current use of opiate analgesic   6. Lumbar spondylosis  Problems updated and reviewed during this visit: Problem  Chronic Pain of Right Lower Extremity  Lumbar Spondylosis   Plan of Care  Pharmacotherapy (Medications Ordered): Meds ordered this encounter  Medications  . gabapentin (NEURONTIN) 300 MG capsule    Sig: Take 1 capsule (300 mg total) by mouth 3 (three) times daily. At bedtime    Dispense:  90 capsule    Refill:  0    Order Specific Question:   Supervising Provider    Answer:   Teressa Lower [[id number]]  . tiZANidine (ZANAFLEX) 4 MG tablet    Sig: Take 1 tablet (4 mg total) by mouth every 8 (eight) hours as needed for muscle  spasms.    Dispense:  90 tablet    Refill:  0    Order Specific Question:   Supervising Provider    Answer:   Teressa Lower [[id number]]  . HYDROcodone-acetaminophen (NORCO) 10-325 MG tablet    Sig: Take 1 tablet by mouth every 12 (twelve) hours as needed for severe pain.    Dispense:  60 tablet    Refill:  0    Do not place this medication on "Automatic Refill". Patient may have prescription filled one day early if pharmacy is closed on scheduled refill date.    Order Specific Question:   Supervising Provider    Answer:   Milinda Pointer [[id number]]   New Prescriptions   No medications on file   Medications administered today: Paden Senger. Arvidson had no medications administered during this visit. Lab-work, procedure(s), and/or referral(s): Orders Placed This Encounter  Procedures  . MR LUMBAR SPINE WO CONTRAST  . ToxASSURE Select 13 (MW), Urine   Imaging and/or referral(s): MR LUMBAR SPINE WO CONTRAST  Interventional therapies: Planned, scheduled, and/or pending:   Not at this time. He has a apt with [facility] 05/22/2018 with Neurosurgery patient requesting MRI to be completed prior to apt.     Provider-requested follow-up: Return in about 4 weeks (around 05/31/2018) for MedMgmt, MRI.  Future Appointments  Date Time Provider [place]  06/01/2018  2:30 PM Vevelyn Francois, NP [facility] None   Primary Care Physician: Steele Sizer, MD Location: [facility] Outpatient Pain Management Facility Note by: Vevelyn Francois NP Date: 05/03/2018; Time: 1:13 PM  Pain Score Disclaimer: We use the NRS-11 scale. This is a self-reported, subjective measurement of pain severity with only modest accuracy. It is used primarily to identify changes within a particular patient. It must be understood that outpatient pain scales are significantly less accurate that those used for research, where they can be applied under ideal controlled circumstances with minimal exposure to variables. In reality, the score is  likely to be a combination of pain intensity and pain affect, where pain affect describes the degree of emotional arousal or changes in action readiness caused by the sensory experience of pain. Factors such as social and work situation, setting, emotional state, anxiety levels, expectation, and prior pain experience may influence pain perception and show large inter-individual differences that may also be affected by time variables.  Patient instructions provided during this appointment: Patient Instructions  ____________________________________________________________________________________________  Medication Rules  Purpose: To inform patients, and their family members, of our rules and regulations.  Applies to: All patients receiving prescriptions (written or electronic).  Pharmacy of record: Pharmacy where electronic prescriptions will be sent. If written prescriptions are taken to a different pharmacy, please inform the nursing staff. The pharmacy listed in the electronic medical record should be the one where you would  like electronic prescriptions to be sent.  Electronic prescriptions: In compliance with the [place] (STOP) Act of 2017 (Session Lanny Cramp [id number]), effective April 26, 2018, all controlled substances must be electronically prescribed. Calling prescriptions to the pharmacy will cease to exist.  Prescription refills: Only during scheduled appointments. Applies to all prescriptions.  NOTE: The following applies primarily to controlled substances (Opioid* Pain Medications).   Patient's responsibilities: 1. Pain Pills: Bring all pain pills to every appointment (except for procedure appointments). 2. Pill Bottles: Bring pills in original pharmacy bottle. Always bring the newest bottle. Bring bottle, even if empty. 3. Medication refills: You are responsible for knowing and keeping track of what medications you take and those you need  refilled. The day before your appointment: write a list of all prescriptions that need to be refilled. The day of the appointment: give the list to the admitting nurse. Prescriptions will be written only during appointments. If you forget a medication: it will not be "Called in", "Faxed", or "electronically sent". You will need to get another appointment to get these prescribed. No early refills. Do not call asking to have your prescription filled early. 4. Prescription Accuracy: You are responsible for carefully inspecting your prescriptions before leaving our office. Have the discharge nurse carefully go over each prescription with you, before taking them home. Make sure that your name is accurately spelled, that your address is correct. Check the name and dose of your medication to make sure it is accurate. Check the number of pills, and the written instructions to make sure they are clear and accurate. Make sure that you are given enough medication to last until your next medication refill appointment. 5. Taking Medication: Take medication as prescribed. When it comes to controlled substances, taking less pills or less frequently than prescribed is permitted and encouraged. Never take more pills than instructed. Never take medication more frequently than prescribed.  6. Inform other Doctors: Always inform, all of your healthcare providers, of all the medications you take. 7. Pain Medication from other Providers: You are not allowed to accept any additional pain medication from any other Doctor or Healthcare provider. There are two exceptions to this rule. (see below) In the event that you require additional pain medication, you are responsible for notifying us, as stated below. 8. Medication Agreement: You are responsible for carefully reading and following our Medication Agreement. This must be signed before receiving any prescriptions from our practice. Safely store a copy of your signed Agreement.  Violations to the Agreement will result in no further prescriptions. (Additional copies of our Medication Agreement are available upon request.) 9. Laws, Rules, & Regulations: All patients are expected to follow all Federal and [employer], Rules, [employer]. Ignorance of the Laws does not constitute a valid excuse. The use of any illegal substances is prohibited. 10. Adopted CDC guidelines & recommendations: Target dosing levels will be at or below 60 MME/day. Use of benzodiazepines** is not recommended.  Exceptions: There are only two exceptions to the rule of not receiving pain medications from other Healthcare Providers. 1. Exception #1 (Emergencies): In the event of an emergency (i.e.: accident requiring emergency care), you are allowed to receive additional pain medication. However, you are responsible for: As soon as you are able, call our office [phone], at any time of the day or night, and leave a message stating your name, the date and nature of the emergency, and the name and dose of the medication  prescribed. In the event that your call is answered by a member of our staff, make sure to document and save the date, time, and the name of the person that took your information.  2. Exception #2 (Planned Surgery): In the event that you are scheduled by another doctor or dentist to have any type of surgery or procedure, you are allowed (for a period no longer than 30 days), to receive additional pain medication, for the acute post-op pain. However, in this case, you are responsible for picking up a copy of our "Post-op Pain Management for Surgeons" handout, and giving it to your surgeon or dentist. This document is available at our office, and does not require an appointment to obtain it. Simply go to our office during business hours (Monday-Thursday from 8:00 AM to 4:00 PM) (Friday 8:00 AM to 12:00 Noon) or if you have a scheduled appointment with [place], prior to your surgery, and ask  for it by name. In addition, you will need to provide [place] with your name, name of your surgeon, type of surgery, and date of procedure or surgery.  *Opioid medications include: morphine, codeine, oxycodone, oxymorphone, hydrocodone, hydromorphone, meperidine, tramadol, tapentadol, buprenorphine, fentanyl, methadone. **Benzodiazepine medications include: diazepam (Valium), alprazolam (Xanax), clonazepam (Klonopine), lorazepam (Ativan), clorazepate (Tranxene), chlordiazepoxide (Librium), estazolam (Prosom), oxazepam (Serax), temazepam (Restoril), triazolam (Halcion) (Last updated: 06/23/2017) ____________________________________________________________________________________________

## 2018-05-03 NOTE — Patient Instructions (Signed)
____________________________________________________________________________________________  Medication Rules  Purpose: To inform patients, and their family members, of our rules and regulations.  Applies to: All patients receiving prescriptions (written or electronic).  Pharmacy of record: Pharmacy where electronic prescriptions will be sent. If written prescriptions are taken to a different pharmacy, please inform the nursing staff. The pharmacy listed in the electronic medical record should be the one where you would like electronic prescriptions to be sent.  Electronic prescriptions: In compliance with the [place] Strengthen Opioid Misuse Prevention (STOP) Act of 2017 (Session Law 2017-74/H243), effective April 26, 2018, all controlled substances must be electronically prescribed. Calling prescriptions to the pharmacy will cease to exist.  Prescription refills: Only during scheduled appointments. Applies to all prescriptions.  NOTE: The following applies primarily to controlled substances (Opioid* Pain Medications).   Patient's responsibilities: 1. Pain Pills: Bring all pain pills to every appointment (except for procedure appointments). 2. Pill Bottles: Bring pills in original pharmacy bottle. Always bring the newest bottle. Bring bottle, even if empty. 3. Medication refills: You are responsible for knowing and keeping track of what medications you take and those you need refilled. The day before your appointment: write a list of all prescriptions that need to be refilled. The day of the appointment: give the list to the admitting nurse. Prescriptions will be written only during appointments. If you forget a medication: it will not be "Called in", "Faxed", or "electronically sent". You will need to get another appointment to get these prescribed. No early refills. Do not call asking to have your prescription filled early. 4. Prescription Accuracy: You are responsible for  carefully inspecting your prescriptions before leaving our office. Have the discharge nurse carefully go over each prescription with you, before taking them home. Make sure that your name is accurately spelled, that your address is correct. Check the name and dose of your medication to make sure it is accurate. Check the number of pills, and the written instructions to make sure they are clear and accurate. Make sure that you are given enough medication to last until your next medication refill appointment. 5. Taking Medication: Take medication as prescribed. When it comes to controlled substances, taking less pills or less frequently than prescribed is permitted and encouraged. Never take more pills than instructed. Never take medication more frequently than prescribed.  6. Inform other Doctors: Always inform, all of your healthcare providers, of all the medications you take. 7. Pain Medication from other Providers: You are not allowed to accept any additional pain medication from any other Doctor or Healthcare provider. There are two exceptions to this rule. (see below) In the event that you require additional pain medication, you are responsible for notifying us, as stated below. 8. Medication Agreement: You are responsible for carefully reading and following our Medication Agreement. This must be signed before receiving any prescriptions from our practice. Safely store a copy of your signed Agreement. Violations to the Agreement will result in no further prescriptions. (Additional copies of our Medication Agreement are available upon request.) 9. Laws, Rules, & Regulations: All patients are expected to follow all Federal and State Laws, Statutes, Rules, & Regulations. Ignorance of the Laws does not constitute a valid excuse. The use of any illegal substances is prohibited. 10. Adopted CDC guidelines & recommendations: Target dosing levels will be at or below 60 MME/day. Use of benzodiazepines** is not  recommended.  Exceptions: There are only two exceptions to the rule of not receiving pain medications from other Healthcare Providers. 1.   Exception #1 (Emergencies): In the event of an emergency (i.e.: accident requiring emergency care), you are allowed to receive additional pain medication. However, you are responsible for: As soon as you are able, call our office [phone], at any time of the day or night, and leave a message stating your name, the date and nature of the emergency, and the name and dose of the medication prescribed. In the event that your call is answered by a member of our staff, make sure to document and save the date, time, and the name of the person that took your information.  2. Exception #2 (Planned Surgery): In the event that you are scheduled by another doctor or dentist to have any type of surgery or procedure, you are allowed (for a period no longer than 30 days), to receive additional pain medication, for the acute post-op pain. However, in this case, you are responsible for picking up a copy of our "Post-op Pain Management for Surgeons" handout, and giving it to your surgeon or dentist. This document is available at our office, and does not require an appointment to obtain it. Simply go to our office during business hours (Monday-Thursday from 8:00 AM to 4:00 PM) (Friday 8:00 AM to 12:00 Noon) or if you have a scheduled appointment with us, prior to your surgery, and ask for it by name. In addition, you will need to provide us with your name, name of your surgeon, type of surgery, and date of procedure or surgery.  *Opioid medications include: morphine, codeine, oxycodone, oxymorphone, hydrocodone, hydromorphone, meperidine, tramadol, tapentadol, buprenorphine, fentanyl, methadone. **Benzodiazepine medications include: diazepam (Valium), alprazolam (Xanax), clonazepam (Klonopine), lorazepam (Ativan), clorazepate (Tranxene), chlordiazepoxide (Librium), estazolam (Prosom),  oxazepam (Serax), temazepam (Restoril), triazolam (Halcion) (Last updated: 06/23/2017) ____________________________________________________________________________________________    

## 2018-05-03 NOTE — Progress Notes (Signed)
Safety precautions to be maintained throughout the outpatient stay will include: orient to surroundings, keep bed in low position, maintain call bell within reach at all times, provide assistance with transfer out of bed and ambulation.  

## 2018-05-07 LAB — TOXASSURE SELECT 13 (MW), URINE

## 2018-05-09 ENCOUNTER — Ambulatory Visit: Payer: BLUE CROSS/BLUE SHIELD | Admitting: Nurse Practitioner

## 2018-05-23 ENCOUNTER — Other Ambulatory Visit: Payer: Self-pay | Admitting: Nurse Practitioner

## 2018-06-01 ENCOUNTER — Other Ambulatory Visit: Payer: Self-pay

## 2018-06-01 ENCOUNTER — Encounter: Payer: Self-pay | Admitting: Nurse Practitioner

## 2018-06-01 ENCOUNTER — Ambulatory Visit: Payer: BLUE CROSS/BLUE SHIELD | Attending: Nurse Practitioner | Admitting: Nurse Practitioner

## 2018-06-01 VITALS — BP 157/84 | HR 76 | Temp 97.7°F | Ht 73.0 in | Wt 205.0 lb

## 2018-06-01 DIAGNOSIS — M5442 Lumbago with sciatica, left side: Secondary | ICD-10-CM | POA: Insufficient documentation

## 2018-06-01 DIAGNOSIS — G894 Chronic pain syndrome: Secondary | ICD-10-CM | POA: Diagnosis present

## 2018-06-01 DIAGNOSIS — G8929 Other chronic pain: Secondary | ICD-10-CM | POA: Diagnosis present

## 2018-06-01 DIAGNOSIS — Z79891 Long term (current) use of opiate analgesic: Secondary | ICD-10-CM | POA: Diagnosis present

## 2018-06-01 DIAGNOSIS — M79604 Pain in right leg: Secondary | ICD-10-CM | POA: Diagnosis present

## 2018-06-01 DIAGNOSIS — M5441 Lumbago with sciatica, right side: Secondary | ICD-10-CM | POA: Diagnosis present

## 2018-06-01 MED ORDER — HYDROCODONE-ACETAMINOPHEN 10-325 MG PO TABS: 1.0000 | ORAL_TABLET | Freq: Two times a day (BID) | ORAL | 0 refills | Status: DC | PRN

## 2018-06-01 MED ORDER — GABAPENTIN 300 MG PO CAPS: 300.0000 mg | ORAL_CAPSULE | Freq: Three times a day (TID) | ORAL | 0 refills | Status: DC

## 2018-06-01 MED ORDER — TIZANIDINE HCL 4 MG PO TABS: 4.0000 mg | ORAL_TABLET | Freq: Three times a day (TID) | ORAL | 0 refills | Status: DC | PRN

## 2018-06-01 NOTE — Progress Notes (Signed)
Nursing Pain Medication Assessment:  Safety precautions to be maintained throughout the outpatient stay will include: orient to surroundings, keep bed in low position, maintain call bell within reach at all times, provide assistance with transfer out of bed and ambulation.  Medication Inspection Compliance: Pill count conducted under aseptic conditions, in front of the patient. Neither the pills nor the bottle was removed from the patient's sight at any time. Once count was completed pills were immediately returned to the patient in their original bottle.  Medication: Hydrocodone/APAP Pill/Patch Count: 0 of 60 pills remain Pill/Patch Appearance: Markings consistent with prescribed medication Bottle Appearance: Standard pharmacy container. Clearly labeled. Filled Date: 1 / 8 / 2020 Last Medication intake:  Ran out of medicine more than 48 hours ago

## 2018-06-01 NOTE — Patient Instructions (Signed)
____________________________________________________________________________________________  Medication Rules  Purpose: To inform patients, and their family members, of our rules and regulations.  Applies to: All patients receiving prescriptions (written or electronic).  Pharmacy of record: Pharmacy where electronic prescriptions will be sent. If written prescriptions are taken to a different pharmacy, please inform the nursing staff. The pharmacy listed in the electronic medical record should be the one where you would like electronic prescriptions to be sent.  Electronic prescriptions: In compliance with the [place] Strengthen Opioid Misuse Prevention (STOP) Act of 2017 (Session Law 2017-74/H243), effective April 26, 2018, all controlled substances must be electronically prescribed. Calling prescriptions to the pharmacy will cease to exist.  Prescription refills: Only during scheduled appointments. Applies to all prescriptions.  NOTE: The following applies primarily to controlled substances (Opioid* Pain Medications).   Patient's responsibilities: 1. Pain Pills: Bring all pain pills to every appointment (except for procedure appointments). 2. Pill Bottles: Bring pills in original pharmacy bottle. Always bring the newest bottle. Bring bottle, even if empty. 3. Medication refills: You are responsible for knowing and keeping track of what medications you take and those you need refilled. The day before your appointment: write a list of all prescriptions that need to be refilled. The day of the appointment: give the list to the admitting nurse. Prescriptions will be written only during appointments. No prescriptions will be written on procedure days. If you forget a medication: it will not be "Called in", "Faxed", or "electronically sent". You will need to get another appointment to get these prescribed. No early refills. Do not call asking to have your prescription filled  early. 4. Prescription Accuracy: You are responsible for carefully inspecting your prescriptions before leaving our office. Have the discharge nurse carefully go over each prescription with you, before taking them home. Make sure that your name is accurately spelled, that your address is correct. Check the name and dose of your medication to make sure it is accurate. Check the number of pills, and the written instructions to make sure they are clear and accurate. Make sure that you are given enough medication to last until your next medication refill appointment. 5. Taking Medication: Take medication as prescribed. When it comes to controlled substances, taking less pills or less frequently than prescribed is permitted and encouraged. Never take more pills than instructed. Never take medication more frequently than prescribed.  6. Inform other Doctors: Always inform, all of your healthcare providers, of all the medications you take. 7. Pain Medication from other Providers: You are not allowed to accept any additional pain medication from any other Doctor or Healthcare provider. There are two exceptions to this rule. (see below) In the event that you require additional pain medication, you are responsible for notifying us, as stated below. 8. Medication Agreement: You are responsible for carefully reading and following our Medication Agreement. This must be signed before receiving any prescriptions from our practice. Safely store a copy of your signed Agreement. Violations to the Agreement will result in no further prescriptions. (Additional copies of our Medication Agreement are available upon request.) 9. Laws, Rules, & Regulations: All patients are expected to follow all Federal and State Laws, Statutes, Rules, & Regulations. Ignorance of the Laws does not constitute a valid excuse. The use of any illegal substances is prohibited. 10. Adopted CDC guidelines & recommendations: Target dosing levels will be  at or below 60 MME/day. Use of benzodiazepines** is not recommended.  Exceptions: There are only two exceptions to the rule of not   receiving pain medications from other Healthcare Providers. 1. Exception #1 (Emergencies): In the event of an emergency (i.e.: accident requiring emergency care), you are allowed to receive additional pain medication. However, you are responsible for: As soon as you are able, call our office [phone], at any time of the day or night, and leave a message stating your name, the date and nature of the emergency, and the name and dose of the medication prescribed. In the event that your call is answered by a member of our staff, make sure to document and save the date, time, and the name of the person that took your information.  2. Exception #2 (Planned Surgery): In the event that you are scheduled by another doctor or dentist to have any type of surgery or procedure, you are allowed (for a period no longer than 30 days), to receive additional pain medication, for the acute post-op pain. However, in this case, you are responsible for picking up a copy of our "Post-op Pain Management for Surgeons" handout, and giving it to your surgeon or dentist. This document is available at our office, and does not require an appointment to obtain it. Simply go to our office during business hours (Monday-Thursday from 8:00 AM to 4:00 PM) (Friday 8:00 AM to 12:00 Noon) or if you have a scheduled appointment with us, prior to your surgery, and ask for it by name. In addition, you will need to provide us with your name, name of your surgeon, type of surgery, and date of procedure or surgery.  *Opioid medications include: morphine, codeine, oxycodone, oxymorphone, hydrocodone, hydromorphone, meperidine, tramadol, tapentadol, buprenorphine, fentanyl, methadone. **Benzodiazepine medications include: diazepam (Valium), alprazolam (Xanax), clonazepam (Klonopine), lorazepam (Ativan), clorazepate  (Tranxene), chlordiazepoxide (Librium), estazolam (Prosom), oxazepam (Serax), temazepam (Restoril), triazolam (Halcion) (Last updated: 06/23/2017) ____________________________________________________________________________________________    

## 2018-06-01 NOTE — Progress Notes (Signed)
Patient's Name: Wesley Adams  MRN: [id number]  Referring Provider: Steele Sizer, MD  DOB: 1965/11/25  PCP: Steele Sizer, MD  DOS: 06/01/2018  Note by: Vevelyn Francois NP  Service setting: Ambulatory outpatient  Specialty: Interventional Pain Management  Location: [facility] (AMB) Pain Management Facility    Patient type: Established    Primary Reason(s) for Visit: Encounter for prescription drug management. (Level of risk: moderate)  CC: Back Pain  HPI  Wesley Adams is a 54 y.o. year old, male patient, who comes today for a medication management evaluation. He has Lumbar degenerative disc disease; Acute low back pain without sciatica; Spinal cord stimulator status; Chronic, continuous use of opioids; Tobacco use; Epigastric pain; Special screening for malignant neoplasms, colon; Chronic bilateral low back pain with bilateral sciatica; Chronic pain syndrome; Opiate use; Long term current use of opiate analgesic; Chest pain; Iliac artery stenosis, left ([place]); Atherosclerosis of aorta ([place]); Coronary artery disease involving native heart; Lumbar spondylosis; and Chronic pain of right lower extremity on their problem list. His primarily concern today is the Back Pain  Pain Assessment: Location: Left, Right Back Radiating: pain radiaties down both leg Onset: More than a month ago Duration: Chronic pain Quality: Dull, Aching Severity: 8 /10 (subjective, self-reported pain score)  Note: Reported level is compatible with observation. Clinically the patient looks like a 2/10 A 2/10 is viewed as "Mild to Moderate" and described as noticeable and distracting. Impossible to hide from other people. More frequent flare-ups. Still possible to adapt and function close to normal. It can be very annoying and may have occasional stronger flare-ups. With discipline, patients may get used to it and adapt. Information on the proper use of the pain scale provided to the patient today. When using our objective Pain  Scale, levels between 6 and 10/10 are said to belong in an emergency room, as it progressively worsens from a 6/10, described as severely limiting, requiring emergency care not usually available at an outpatient pain management facility. At a 6/10 level, communication becomes difficult and requires great effort. Assistance to reach the emergency department may be required. Facial flushing and profuse sweating along with potentially dangerous increases in heart rate and blood pressure will be evident. Effect on ADL: worse in the morning Timing: Constant Modifying factors: nothing BP: (!) 157/84  HR: 76  Wesley Adams was last scheduled for an appointment on 05/23/2018 for medication management. During today's appointment we reviewed Wesley Adams chronic pain status, as well as his outpatient medication regimen. He has pain that goes into his knee.     The patient  reports no history of drug use. His body mass index is 27.05 kg/m.  Further details on both, my assessment(s), as well as the proposed treatment plan, please see below.  Controlled Substance Pharmacotherapy Assessment REMS (Risk Evaluation and Mitigation Strategy)  Analgesic:Hydrocodone /APAP 10/39m QID MME/day:431mday BrChauncey FischerRN  06/01/2018  2:06 PM  Sign when Signing Visit Nursing Pain Medication Assessment:  Safety precautions to be maintained throughout the outpatient stay will include: orient to surroundings, keep bed in low position, maintain call bell within reach at all times, provide assistance with transfer out of bed and ambulation.  Medication Inspection Compliance: Pill count conducted under aseptic conditions, in front of the patient. Neither the pills nor the bottle was removed from the patient's sight at any time. Once count was completed pills were immediately returned to the patient in their original bottle.  Medication: Hydrocodone/APAP Pill/Patch Count: 0 of 60  pills remain Pill/Patch Appearance:  Markings consistent with prescribed medication Bottle Appearance: Standard pharmacy container. Clearly labeled. Filled Date: 1 / 8 / 2020 Last Medication intake:  Ran out of medicine more than 48 hours ago   Pharmacokinetics: Liberation and absorption (onset of action): WNL Distribution (time to peak effect): WNL Metabolism and excretion (duration of action): WNL         Pharmacodynamics: Desired effects: Analgesia: Mr. Marchiano reports >50% benefit. Functional ability: Patient reports that medication allows him to accomplish basic ADLs Clinically meaningful improvement in function (CMIF): Sustained CMIF goals met Perceived effectiveness: Described as relatively effective, allowing for increase in activities of daily living (ADL) Undesirable effects: Side-effects or Adverse reactions: None reported Monitoring: [place] PMP: Online review of the past 46-monthperiod conducted. Compliant with practice rules and regulations Last UDS on record: Summary  Date Value Ref Range Status  05/03/2018 FINAL  Final    Comment:    ==================================================================== TOXASSURE SELECT 13 (MW) ==================================================================== Test                             Result       Flag       Units   NO DRUGS DETECTED. ==================================================================== Test                      Result    Flag   Units      Ref Range   Creatinine              256              mg/dL      >=20 ==================================================================== Declared Medications:  The flagging and interpretation on this report are based on the  following declared medications.  Unexpected results may arise from  inaccuracies in the declared medications.  **Note: The testing scope of this panel does not include following  reported medications:  Acetaminophen (Tylenol)  Aspirin  Atorvastatin (Lipitor)  Gabapentin  (Neurontin) ==================================================================== For clinical consultation, please call [phone] ====================================================================    UDS interpretation: Compliant          Medication Assessment Form: Reviewed. Patient indicates being compliant with therapy Treatment compliance: Compliant Risk Assessment Profile: Aberrant behavior: See initial evaluations. None observed or detected today Comorbid factors increasing risk of overdose: See initial evaluation. No additional risks detected today Opioid risk tool (ORT):  Opioid Risk  06/01/2018  Alcohol 0  Illegal Drugs 0  Rx Drugs 0  Alcohol 0  Illegal Drugs 4  Rx Drugs 0  Age between 16-45 years  0  History of Preadolescent Sexual Abuse 0  Psychological Disease 0  ADD Negative  OCD Negative  Bipolar Negative  Depression 0  Opioid Risk Tool Scoring 4  Opioid Risk Interpretation Moderate Risk    ORT Scoring interpretation table:  Score <3 = Low Risk for SUD  Score between 4-7 = Moderate Risk for SUD  Score >8 = High Risk for Opioid Abuse   Risk of substance use disorder (SUD): Low  Risk Mitigation Strategies:  Patient Counseling: Covered Patient-Prescriber Agreement (PPA): Present and active  Notification to other healthcare providers: Done  Pharmacologic Plan: No change in therapy, at this time.             Laboratory Chemistry  Inflammation Markers (CRP: Acute Phase) (ESR: Chronic Phase) No results found for: CRP, ESRSEDRATE, LATICACIDVEN  Rheumatology Markers No results found for: RF, ANA, LABURIC, URICUR, LYMEIGGIGMAB, LYMEABIGMQN, HLAB27                      Renal Function Markers Lab Results  Component Value Date   BUN 12 01/02/2018   CREATININE 0.81 01/02/2018   BCR 15 01/02/2018   GFRAA 118 01/02/2018   GFRNONAA 102 01/02/2018                             Hepatic Function Markers Lab Results  Component Value  Date   AST 24 12/06/2017   ALT 21 12/06/2017   ALBUMIN 4.4 12/06/2017   ALKPHOS 64 12/06/2017   LIPASE 41 12/06/2017                        Electrolytes Lab Results  Component Value Date   NA 138 12/12/2017   K 4.7 12/12/2017   CL 102 12/12/2017   CALCIUM 9.7 12/12/2017                        Neuropathy Markers Lab Results  Component Value Date   HIV Non Reactive 12/06/2017                        CNS Tests No results found for: COLORCSF, APPEARCSF, RBCCOUNTCSF, WBCCSF, POLYSCSF, LYMPHSCSF, EOSCSF, PROTEINCSF, GLUCCSF, JCVIRUS, CSFOLI, IGGCSF                      Bone Pathology Markers No results found for: VD25OH, VD125OH2TOT, [id number], [id number], 25OHVITD1, 25OHVITD2, 25OHVITD3, TESTOFREE, TESTOSTERONE                       Coagulation Parameters Lab Results  Component Value Date   INR 0.95 12/06/2017   LABPROT 12.6 12/06/2017   APTT 29 12/06/2017   PLT 204 12/12/2017                        Cardiovascular Markers Lab Results  Component Value Date   BNP 13.0 12/06/2017   TROPONINI 0.03 ([place]) 12/07/2017   HGB 15.0 12/12/2017   HCT 44.3 12/12/2017                         CA Markers No results found for: CEA, CA125, LABCA2                      Endocrine Markers No results found for: TSH, FREET4, TESTOFREE, TESTOSTERONE, ESTRADIOL, ESTRADIOLPCT, ESTRADIOLFRE                      Note: Lab results reviewed.  Recent Diagnostic Imaging Results  CT HEMATURIA WORKUP CLINICAL DATA:  Episodes of gross hematuria.  EXAM: CT ABDOMEN AND PELVIS WITHOUT AND WITH CONTRAST  TECHNIQUE: Multidetector CT imaging of the abdomen and pelvis was performed following the standard protocol before and following the bolus administration of intravenous contrast.  CONTRAST:  188m ISOVUE-300 IOPAMIDOL (ISOVUE-300) INJECTION 61%  COMPARISON:  CT scan 04/29/2017  FINDINGS: Lower chest: The lung bases are clear of acute process. No pleural effusion or pulmonary lesions. The  heart is normal in size. No pericardial effusion. Coronary artery calcifications are noted. The distal esophagus and aorta are unremarkable.  Hepatobiliary: No focal  hepatic lesions or intrahepatic biliary dilatation. The gallbladder is normal. No common bile duct dilatation.  Pancreas: No mass, inflammation or ductal dilatation.  Spleen: Normal size.  No focal lesions.  Adrenals/Urinary Tract: Adrenal glands are unremarkable.  No renal, ureteral or bladder calculi. Both kidneys demonstrate normal enhancement/perfusion. No worrisome renal lesions. There is a stable 2 cm cyst in the midpole region right kidney. The delayed images do not demonstrate any significant collecting system abnormalities. Both ureters are normal. The bladder is normal.  Stomach/Bowel: The stomach, duodenum, small bowel and colon are grossly normal without oral contrast. No inflammatory changes, mass lesions or obstructive findings. The terminal ileum and appendix are normal.  Vascular/Lymphatic: Advanced atherosclerotic calcifications involving the aorta and iliac arteries but no focal aneurysm or dissection. The branch vessels are patent. The major venous structures are patent. No mesenteric or retroperitoneal mass or adenopathy.  Reproductive: The prostate gland and seminal vesicles are unremarkable.  Other: No pelvic mass or adenopathy. No free pelvic fluid collections. No inguinal mass or adenopathy. No abdominal wall hernia or subcutaneous lesions.  Musculoskeletal: A right femoral rod is noted. Both hips are normally located. Evidence of prior left iliac bone trauma with areas of heterotopic ossification.  Stable lumbar fusion hardware.  IMPRESSION: 1. No CT findings to account for the patient's hematuria. No renal, ureteral or bladder calculi or mass. Stable right renal cyst. 2. No acute abdominal/pelvic findings, mass lesions or adenopathy. 3. Stable age advanced atherosclerotic  calcifications involving the abdominal aorta and iliac arteries. Coronary artery calcifications are also noted  Electronically Signed   By: Marijo Sanes M.D.   On: 01/23/2018 15:57  Complexity Note: Imaging results reviewed. Results shared with Wesley Adams, using Layman's terms.                         Meds   Current Outpatient Medications:  .  acetaminophen (TYLENOL) 325 MG tablet, Take 2 tablets (650 mg total) by mouth every 6 (six) hours as needed for mild pain (or Fever >/= 101)., Disp: , Rfl:  .  aspirin EC 81 MG EC tablet, Take 1 tablet (81 mg total) by mouth daily., Disp: , Rfl:  .  atorvastatin (LIPITOR) 40 MG tablet, Take 1 tablet (40 mg total) by mouth daily., Disp: 90 tablet, Rfl: 1 .  gabapentin (NEURONTIN) 300 MG capsule, Take 1 capsule (300 mg total) by mouth 3 (three) times daily. At bedtime, Disp: 90 capsule, Rfl: 0 .  [START ON 06/02/2018] HYDROcodone-acetaminophen (NORCO) 10-325 MG tablet, Take 1 tablet by mouth every 12 (twelve) hours as needed for up to 30 days for severe pain., Disp: 60 tablet, Rfl: 0 .  tiZANidine (ZANAFLEX) 4 MG tablet, Take 1 tablet (4 mg total) by mouth every 8 (eight) hours as needed for up to 30 days for muscle spasms., Disp: 90 tablet, Rfl: 0  ROS  Constitutional: Denies any fever or chills Gastrointestinal: No reported hemesis, hematochezia, vomiting, or acute GI distress Musculoskeletal: Denies any acute onset joint swelling, redness, loss of ROM, or weakness Neurological: No reported episodes of acute onset apraxia, aphasia, dysarthria, agnosia, amnesia, paralysis, loss of coordination, or loss of consciousness  Allergies  Wesley Adams has No Known Allergies.  [place]  Drug: Wesley Adams  reports no history of drug use. Alcohol:  reports previous alcohol use. Tobacco:  reports that he has been smoking cigarettes. He has been smoking about 0.00 packs per day for the past 30.00 years. He  has never used smokeless tobacco. Medical:  has a  past medical history of Chronic back pain and Flu. Surgical: Wesley Adams  has a past surgical history that includes Back surgery (Feb 2016); Spinal cord stimulator insertion (03/23/2017); Colonoscopy with propofol (N/A, 06/01/2017); Esophagogastroduodenoscopy (egd) with propofol (06/01/2017); polypectomy (06/01/2017); Spinal cord stimulator removal (N/A, 06/16/2017); Fracture surgery (Right, as child); and LEFT HEART CATH AND CORONARY ANGIOGRAPHY (N/A, 12/08/2017). Family: family history includes Alcohol abuse in his mother; Heart attack in his father and paternal grandfather; Heart disease in his father; Obesity in his father.  Constitutional Exam  General appearance: Well nourished, well developed, and well hydrated. In no apparent acute distress Vitals:   06/01/18 1407  BP: (!) 157/84  Pulse: 76  Temp: 97.7 F (36.5 C)  SpO2: 100%  Weight: 205 lb (93 kg)  Height: 6' 1"  (1.854 m)  Psych/Mental status: Alert, oriented x 3 (person, place, & time)       Eyes: PERLA Respiratory: No evidence of acute respiratory distress  Lumbar Spine Area Exam  Skin & Axial Inspection: Well healed scar from previous spine surgery detected Alignment: Symmetrical Functional ROM: Unrestricted ROM       Stability: No instability detected Muscle Tone/Strength: Functionally intact. No obvious neuro-muscular anomalies detected. Sensory (Neurological): Unimpaired Palpation: Tender       Provocative Tests: Hyperextension/rotation test: Positive bilaterally for facet joint pain. Lumbar quadrant test (Kemp's test): deferred today       Lateral bending test: deferred today       Patrick's Maneuver: deferred today                    Gait & Posture Assessment  Ambulation: Unassisted Gait: Relatively normal for age and body habitus Posture: WNL   Lower Extremity Exam    Side: Right lower extremity  Side: Left lower extremity  Stability: No instability observed          Stability: No instability observed           Skin & Extremity Inspection: Skin color, temperature, and hair growth are WNL. No peripheral edema or cyanosis. No masses, redness, swelling, asymmetry, or associated skin lesions. No contractures.  Skin & Extremity Inspection: Skin color, temperature, and hair growth are WNL. No peripheral edema or cyanosis. No masses, redness, swelling, asymmetry, or associated skin lesions. No contractures.  Functional ROM: Unrestricted ROM                  Functional ROM: Unrestricted ROM                  Muscle Tone/Strength: Functionally intact. No obvious neuro-muscular anomalies detected.  Muscle Tone/Strength: Functionally intact. No obvious neuro-muscular anomalies detected.  Sensory (Neurological): Referred pain pattern        Sensory (Neurological): Referred pain pattern            Palpation: No palpable anomalies  Palpation: No palpable anomalies   Assessment  Primary Diagnosis & Pertinent Problem List: The primary encounter diagnosis was Chronic bilateral low back pain with bilateral sciatica. Diagnoses of Chronic pain of right lower extremity, Chronic pain syndrome, and Long term current use of opiate analgesic were also pertinent to this visit.  Status Diagnosis  Persistent Persistent Controlled 1. Chronic bilateral low back pain with bilateral sciatica   2. Chronic pain of right lower extremity   3. Chronic pain syndrome   4. Long term current use of opiate analgesic     Problems updated and  reviewed during this visit: No problems updated. Plan of Care  Pharmacotherapy (Medications Ordered): Meds ordered this encounter  Medications  . HYDROcodone-acetaminophen (NORCO) 10-325 MG tablet    Sig: Take 1 tablet by mouth every 12 (twelve) hours as needed for up to 30 days for severe pain.    Dispense:  60 tablet    Refill:  0    Do not place this medication on "Automatic Refill". Patient may have prescription filled one day early if pharmacy is closed on scheduled refill date.    Order  Specific Question:   Supervising Provider    Answer:   Gillis Santa [[id number]]  . tiZANidine (ZANAFLEX) 4 MG tablet    Sig: Take 1 tablet (4 mg total) by mouth every 8 (eight) hours as needed for up to 30 days for muscle spasms.    Dispense:  90 tablet    Refill:  0    Order Specific Question:   Supervising Provider    Answer:   Gillis Santa [id number]  . gabapentin (NEURONTIN) 300 MG capsule    Sig: Take 1 capsule (300 mg total) by mouth 3 (three) times daily. At bedtime    Dispense:  90 capsule    Refill:  0    Order Specific Question:   Supervising Provider    Answer:   Gillis Santa [[id number]]   New Prescriptions   No medications on file   Medications administered today: Wesley Adams. [place] had no medications administered during this visit. Lab-work, procedure(s), and/or referral(s): No orders of the defined types were placed in this encounter.  Imaging and/or referral(s): None  Interventional therapies: Planned, scheduled, and/or pending:   Not at this time.   Provider-requested follow-up: Return in about 4 weeks (around 06/29/2018) for MedMgmt.  Future Appointments  Date Time Provider [place]  06/01/2018  2:30 PM Vevelyn Francois, NP [facility] None   Primary Care Physician: Steele Sizer, MD Location: [facility] Outpatient Pain Management Facility Note by: Vevelyn Francois NP Date: 06/01/2018; Time: 2:29 PM  Pain Score Disclaimer: We use the NRS-11 scale. This is a self-reported, subjective measurement of pain severity with only modest accuracy. It is used primarily to identify changes within a particular patient. It must be understood that outpatient pain scales are significantly less accurate that those used for research, where they can be applied under ideal controlled circumstances with minimal exposure to variables. In reality, the score is likely to be a combination of pain intensity and pain affect, where pain affect describes the degree of emotional arousal or changes  in action readiness caused by the sensory experience of pain. Factors such as social and work situation, setting, emotional state, anxiety levels, expectation, and prior pain experience may influence pain perception and show large inter-individual differences that may also be affected by time variables.  Patient instructions provided during this appointment: Patient Instructions  ____________________________________________________________________________________________  Medication Rules  Purpose: To inform patients, and their family members, of our rules and regulations.  Applies to: All patients receiving prescriptions (written or electronic).  Pharmacy of record: Pharmacy where electronic prescriptions will be sent. If written prescriptions are taken to a different pharmacy, please inform the nursing staff. The pharmacy listed in the electronic medical record should be the one where you would like electronic prescriptions to be sent.  Electronic prescriptions: In compliance with the [place] (STOP) Act of 2017 (Session Lanny Cramp [phone]), effective April 26, 2018, all controlled substances must be electronically prescribed. Calling  prescriptions to the pharmacy will cease to exist.  Prescription refills: Only during scheduled appointments. Applies to all prescriptions.  NOTE: The following applies primarily to controlled substances (Opioid* Pain Medications).   Patient's responsibilities: 1. Pain Pills: Bring all pain pills to every appointment (except for procedure appointments). 2. Pill Bottles: Bring pills in original pharmacy bottle. Always bring the newest bottle. Bring bottle, even if empty. 3. Medication refills: You are responsible for knowing and keeping track of what medications you take and those you need refilled. The day before your appointment: write a list of all prescriptions that need to be refilled. The day of the appointment:  give the list to the admitting nurse. Prescriptions will be written only during appointments. No prescriptions will be written on procedure days. If you forget a medication: it will not be "Called in", "Faxed", or "electronically sent". You will need to get another appointment to get these prescribed. No early refills. Do not call asking to have your prescription filled early. 4. Prescription Accuracy: You are responsible for carefully inspecting your prescriptions before leaving our office. Have the discharge nurse carefully go over each prescription with you, before taking them home. Make sure that your name is accurately spelled, that your address is correct. Check the name and dose of your medication to make sure it is accurate. Check the number of pills, and the written instructions to make sure they are clear and accurate. Make sure that you are given enough medication to last until your next medication refill appointment. 5. Taking Medication: Take medication as prescribed. When it comes to controlled substances, taking less pills or less frequently than prescribed is permitted and encouraged. Never take more pills than instructed. Never take medication more frequently than prescribed.  6. Inform other Doctors: Always inform, all of your healthcare providers, of all the medications you take. 7. Pain Medication from other Providers: You are not allowed to accept any additional pain medication from any other Doctor or Healthcare provider. There are two exceptions to this rule. (see below) In the event that you require additional pain medication, you are responsible for notifying us, as stated below. 8. Medication Agreement: You are responsible for carefully reading and following our Medication Agreement. This must be signed before receiving any prescriptions from our practice. Safely store a copy of your signed Agreement. Violations to the Agreement will result in no further prescriptions. (Additional  copies of our Medication Agreement are available upon request.) 9. Laws, Rules, & Regulations: All patients are expected to follow all Federal and [employer], [employer], Rules, [employer]. Ignorance of the Laws does not constitute a valid excuse. The use of any illegal substances is prohibited. 10. Adopted CDC guidelines & recommendations: Target dosing levels will be at or below 60 MME/day. Use of benzodiazepines** is not recommended.  Exceptions: There are only two exceptions to the rule of not receiving pain medications from other Healthcare Providers. 1. Exception #1 (Emergencies): In the event of an emergency (i.e.: accident requiring emergency care), you are allowed to receive additional pain medication. However, you are responsible for: As soon as you are able, call our office [phone], at any time of the day or night, and leave a message stating your name, the date and nature of the emergency, and the name and dose of the medication prescribed. In the event that your call is answered by a member of our staff, make sure to document and save the date, time, and the name of the person  that took your information.  2. Exception #2 (Planned Surgery): In the event that you are scheduled by another doctor or dentist to have any type of surgery or procedure, you are allowed (for a period no longer than 30 days), to receive additional pain medication, for the acute post-op pain. However, in this case, you are responsible for picking up a copy of our "Post-op Pain Management for Surgeons" handout, and giving it to your surgeon or dentist. This document is available at our office, and does not require an appointment to obtain it. Simply go to our office during business hours (Monday-Thursday from 8:00 AM to 4:00 PM) (Friday 8:00 AM to 12:00 Noon) or if you have a scheduled appointment with [place], prior to your surgery, and ask for it by name. In addition, you will need to provide [place] with your name, name of  your surgeon, type of surgery, and date of procedure or surgery.  *Opioid medications include: morphine, codeine, oxycodone, oxymorphone, hydrocodone, hydromorphone, meperidine, tramadol, tapentadol, buprenorphine, fentanyl, methadone. **Benzodiazepine medications include: diazepam (Valium), alprazolam (Xanax), clonazepam (Klonopine), lorazepam (Ativan), clorazepate (Tranxene), chlordiazepoxide (Librium), estazolam (Prosom), oxazepam (Serax), temazepam (Restoril), triazolam (Halcion) (Last updated: 06/23/2017) ____________________________________________________________________________________________

## 2018-06-24 ENCOUNTER — Other Ambulatory Visit: Payer: Self-pay | Admitting: Nurse Practitioner

## 2018-06-29 ENCOUNTER — Encounter: Payer: Self-pay | Admitting: Nurse Practitioner

## 2018-06-29 ENCOUNTER — Ambulatory Visit: Payer: BLUE CROSS/BLUE SHIELD | Attending: Nurse Practitioner | Admitting: Nurse Practitioner

## 2018-06-29 ENCOUNTER — Other Ambulatory Visit: Payer: Self-pay

## 2018-06-29 VITALS — BP 146/95 | HR 72 | Temp 98.2°F | Ht 73.0 in | Wt 205.0 lb

## 2018-06-29 DIAGNOSIS — M47816 Spondylosis without myelopathy or radiculopathy, lumbar region: Secondary | ICD-10-CM

## 2018-06-29 DIAGNOSIS — M5442 Lumbago with sciatica, left side: Secondary | ICD-10-CM | POA: Diagnosis not present

## 2018-06-29 DIAGNOSIS — G894 Chronic pain syndrome: Secondary | ICD-10-CM | POA: Diagnosis not present

## 2018-06-29 DIAGNOSIS — G8929 Other chronic pain: Secondary | ICD-10-CM

## 2018-06-29 DIAGNOSIS — M5441 Lumbago with sciatica, right side: Secondary | ICD-10-CM

## 2018-06-29 DIAGNOSIS — M5136 Other intervertebral disc degeneration, lumbar region: Secondary | ICD-10-CM

## 2018-06-29 MED ORDER — TIZANIDINE HCL 4 MG PO TABS: 4.0000 mg | ORAL_TABLET | Freq: Three times a day (TID) | ORAL | 0 refills | Status: DC | PRN

## 2018-06-29 MED ORDER — HYDROCODONE-ACETAMINOPHEN 10-325 MG PO TABS: 1.0000 | ORAL_TABLET | Freq: Two times a day (BID) | ORAL | 0 refills | Status: DC | PRN

## 2018-06-29 MED ORDER — GABAPENTIN 300 MG PO CAPS: 300.0000 mg | ORAL_CAPSULE | Freq: Three times a day (TID) | ORAL | 0 refills | Status: DC

## 2018-06-29 NOTE — Progress Notes (Signed)
Nursing Pain Medication Assessment:  Safety precautions to be maintained throughout the outpatient stay will include: orient to surroundings, keep bed in low position, maintain call bell within reach at all times, provide assistance with transfer out of bed and ambulation.  Medication Inspection Compliance: Mr. Chesnut did not comply with our request to bring his pills to be counted. He was reminded that bringing the medication bottles, even when empty, is a requirement.  Medication: None brought in. Pill/Patch Count: None available to be counted. Bottle Appearance: No container available. Did not bring bottle(s) to appointment. Filled Date: N/A Last Medication intake:  Ran out about 9 days ago

## 2018-06-29 NOTE — Progress Notes (Signed)
Patient's Name: Wesley Adams  MRN: [id number]  Referring Provider: Steele Sizer, MD  DOB: [birth year]  PCP: Steele Sizer, MD  DOS: 06/29/2018  Note by: Dionisio David, NP  Service setting: Ambulatory outpatient  Specialty: Interventional Pain Management  Location: [facility] (AMB) Pain Management Facility    Patient type: Established   HPI  Reason for Visit: Wesley Adams is a 53 y.o. year old, male patient, who comes today with a chief complaint of Back Pain Last Appointment: His last appointment at our practice was on 06/24/2018. I last saw him on 06/24/2018.  Pain Assessment: Today, Wesley Adams describes the severity of the Chronic pain as a 9 /10. He indicates the location/referral of the pain to be Back Left, Right/pain radiaties down both legs. Onset was: More than a month ago. The quality of pain is described as Aching, Dull. Temporal description, or timing of pain is: Constant. Possible modifying factors: nothing. Wesley Adams  height is _0  (1.854 m) and weight is 205 lb (93 kg). His temperature is 98.2 F (36.8 C). His blood pressure is 146/95 (abnormal) and his pulse is 72. His oxygen saturation is 100%.  He is concern about his FMLA. He states that he needs to wait 6 months. He states that the MRI was not approved. He states the surgeon at [facility] wants to send him for a number of test and evaluations. He just wants the SCS replaced.  He states that he can not miss any days at work. He admits that he has run out of his medication because of the decrease in quantity. This was because he was always coming in without any medication and he failed to submit a UDS during the time of his office visit. Wesley Adams always has an excuse why something can not be done or was not done.. The change in treatment was fully discussed at his visit for the plan of care. He was also instructed that he was high risk secondary to his previous use of illegal substance, marijuana. Controlled Substance  Pharmacotherapy Assessment REMS (Risk Evaluation and Mitigation Strategy)  Analgesic:Hydrocodone /APAP 10/360m QID MME/day:439mday BrChauncey FischerRN  06/29/2018  2:52 PM  Sign when Signing Visit Nursing Pain Medication Assessment:  Safety precautions to be maintained throughout the outpatient stay will include: orient to surroundings, keep bed in low position, maintain call bell within reach at all times, provide assistance with transfer out of bed and ambulation.  Medication Inspection Compliance: Wesley Adams not comply with our request to bring his pills to be counted. He was reminded that bringing the medication bottles, even when empty, is a requirement.  Medication: None brought in. Pill/Patch Count: None available to be counted. Bottle Appearance: No container available. Did not bring bottle(s) to appointment. Filled Date: N/A Last Medication intake:  Ran out about 9 days ago   Pharmacokinetics: Liberation and absorption (onset of action): WNL Distribution (time to peak effect): WNL Metabolism and excretion (duration of action): WNL         Pharmacodynamics: Desired effects: Analgesia: Wesley Adams >50% benefit. Functional ability: Patient reports that medication allows him to accomplish basic ADLs Clinically meaningful improvement in function (CMIF): Sustained CMIF goals met Perceived effectiveness: Described as relatively effective, allowing for increase in activities of daily living (ADL) Undesirable effects: Side-effects or Adverse reactions: None reported Monitoring: [place] PMP: Online review of the past 1284-monthriod conducted. Compliant with practice rules and regulations Last UDS on record: Summary  Date Value  Ref Range Status  05/03/2018 FINAL  Final    Comment:    ==================================================================== TOXASSURE SELECT 13 (MW) ==================================================================== Test                              Result       Flag       Units   NO DRUGS DETECTED. ==================================================================== Test                      Result    Flag   Units      Ref Range   Creatinine              256              mg/dL      >=20 ==================================================================== Declared Medications:  The flagging and interpretation on this report are based on the  following declared medications.  Unexpected results may arise from  inaccuracies in the declared medications.  **Note: The testing scope of this panel does not include following  reported medications:  Acetaminophen (Tylenol)  Aspirin  Atorvastatin (Lipitor)  Gabapentin (Neurontin) ==================================================================== For clinical consultation, please call [phone]. ====================================================================    UDS interpretation: Unexpected findings: In this case, absence of medication may be secondary to sample timing Medication Assessment Form: Reviewed. Patient indicates being compliant with therapy Treatment compliance: Deficiencies noted and steps taken to remind the patient of the seriousness of adequate therapy compliance Risk Assessment Profile: Aberrant behavior: taking more medication than prescribed and See initial evaluations. None observed or detected today Comorbid factors increasing risk of overdose: See initial evaluation. No additional risks detected today Opioid risk tool (ORT):  Opioid Risk  06/29/2018  Alcohol 0  Illegal Drugs 0  Rx Drugs 0  Alcohol 0  Illegal Drugs 4  Rx Drugs 0  Age between 16-45 years  0  History of Preadolescent Sexual Abuse 0  Psychological Disease 0  ADD Negative  OCD Negative  Bipolar Negative  Depression 0  Opioid Risk Tool Scoring 4  Opioid Risk Interpretation Moderate Risk    ORT Scoring interpretation table:  Score <3 = Low Risk for SUD  Score between 4-7 =  Moderate Risk for SUD  Score >8 = High Risk for Opioid Abuse   Risk of substance use disorder (SUD): High  Risk Mitigation Strategies:  Patient Counseling: Covered Patient-Prescriber Agreement (PPA): Present and active  Notification to other healthcare providers: Done  Pharmacologic Plan: No change in therapy, at this time.           Monthly monitoring will continue.  ROS  Constitutional: Denies any fever or chills Gastrointestinal: No reported hemesis, hematochezia, vomiting, or acute GI distress Musculoskeletal: Denies any acute onset joint swelling, redness, loss of ROM, or weakness Neurological: No reported episodes of acute onset apraxia, aphasia, dysarthria, agnosia, amnesia, paralysis, loss of coordination, or loss of consciousness  Medication Review  HYDROcodone-acetaminophen, acetaminophen, aspirin, atorvastatin, gabapentin, and tiZANidine  History Review  Allergy: Mr. Edmundson has No Known Allergies. Drug: Mr. Subramaniam  reports no history of drug use. Alcohol:  reports previous alcohol use. Tobacco:  reports that he has been smoking cigarettes. He has been smoking about 0.00 packs per day for the past 30.00 years. He has never used smokeless tobacco. Social: Mr. Ingle  reports that he has been smoking cigarettes. He has been smoking about 0.00 packs per day for the past 30.00 years. He has never  used smokeless tobacco. He reports previous alcohol use. He reports that he does not use drugs. Medical:  has a past medical history of Chronic back pain and Flu. Surgical: Mr. Kimura  has a past surgical history that includes Back surgery (Feb 2016); Spinal cord stimulator insertion (03/23/2017); Colonoscopy with propofol (N/A, 06/01/2017); Esophagogastroduodenoscopy (egd) with propofol (06/01/2017); polypectomy (06/01/2017); Spinal cord stimulator removal (N/A, 06/16/2017); Fracture surgery (Right, as child); and LEFT HEART CATH AND CORONARY ANGIOGRAPHY (N/A, 12/08/2017). Family: family  history includes Alcohol abuse in his mother; Heart attack in his father and paternal grandfather; Heart disease in his father; Obesity in his father. Problem List: Mr. Nusz has Chronic bilateral low back pain with bilateral sciatica; Long term current use of opiate analgesic; and Chronic pain of right lower extremity on their pertinent problem list.  Lab Review  Kidney Function Lab Results  Component Value Date   BUN 12 01/02/2018   CREATININE 0.81 01/02/2018   BCR 15 01/02/2018   GFRAA 118 01/02/2018   GFRNONAA 102 01/02/2018  Liver Function Lab Results  Component Value Date   AST 24 12/06/2017   ALT 21 12/06/2017   ALBUMIN 4.4 12/06/2017  Note: Above Lab results reviewed.  Imaging Review  CT HEMATURIA WORKUP CLINICAL DATA:  Episodes of gross hematuria.  EXAM: CT ABDOMEN AND PELVIS WITHOUT AND WITH CONTRAST  TECHNIQUE: Multidetector CT imaging of the abdomen and pelvis was performed following the standard protocol before and following the bolus administration of intravenous contrast.  CONTRAST:  150m ISOVUE-300 IOPAMIDOL (ISOVUE-300) INJECTION 61%  COMPARISON:  CT scan 04/29/2017  FINDINGS: Lower chest: The lung bases are clear of acute process. No pleural effusion or pulmonary lesions. The heart is normal in size. No pericardial effusion. Coronary artery calcifications are noted. The distal esophagus and aorta are unremarkable.  Hepatobiliary: No focal hepatic lesions or intrahepatic biliary dilatation. The gallbladder is normal. No common bile duct dilatation.  Pancreas: No mass, inflammation or ductal dilatation.  Spleen: Normal size.  No focal lesions.  Adrenals/Urinary Tract: Adrenal glands are unremarkable.  No renal, ureteral or bladder calculi. Both kidneys demonstrate normal enhancement/perfusion. No worrisome renal lesions. There is a stable 2 cm cyst in the midpole region right kidney. The delayed images do not demonstrate any significant  collecting system abnormalities. Both ureters are normal. The bladder is normal.  Stomach/Bowel: The stomach, duodenum, small bowel and colon are grossly normal without oral contrast. No inflammatory changes, mass lesions or obstructive findings. The terminal ileum and appendix are normal.  Vascular/Lymphatic: Advanced atherosclerotic calcifications involving the aorta and iliac arteries but no focal aneurysm or dissection. The branch vessels are patent. The major venous structures are patent. No mesenteric or retroperitoneal mass or adenopathy.  Reproductive: The prostate gland and seminal vesicles are unremarkable.  Other: No pelvic mass or adenopathy. No free pelvic fluid collections. No inguinal mass or adenopathy. No abdominal wall hernia or subcutaneous lesions.  Musculoskeletal: A right femoral rod is noted. Both hips are normally located. Evidence of prior left iliac bone trauma with areas of heterotopic ossification.  Stable lumbar fusion hardware.  IMPRESSION: 1. No CT findings to account for the patient's hematuria. No renal, ureteral or bladder calculi or mass. Stable right renal cyst. 2. No acute abdominal/pelvic findings, mass lesions or adenopathy. 3. Stable age advanced atherosclerotic calcifications involving the abdominal aorta and iliac arteries. Coronary artery calcifications are also noted  Electronically Signed   By: PMarijo SanesM.D.   On: 01/23/2018 15:57 Note: Reviewed  Physical Exam  General appearance: Well nourished, well developed, and well hydrated. In no apparent acute distress Mental status: Alert, oriented x 3 (person, place, & time)       Respiratory: No evidence of acute respiratory distress Eyes: PERLA Vitals: BP (!) 146/95   Pulse 72   Temp 98.2 F (36.8 C)   Ht _0  (1.854 m)   Wt 205 lb (93 kg)   SpO2 100%   BMI 27.05 kg/m  BMI: Estimated body mass index is 27.05 kg/m as calculated from the following:   Height as  of this encounter: _1  (1.854 m).   Weight as of this encounter: 205 lb (93 kg). Ideal: Ideal body weight: 79.9 kg (176 lb 2.4 oz) Adjusted ideal body weight: 85.1 kg (187 lb 11 oz) Lumbar Spine Area Exam  Skin & Axial Inspection: Well healed scar from previous spine surgery detected Alignment: Symmetrical Functional ROM: Unrestricted ROM       Stability: No instability detected Muscle Tone/Strength: Functionally intact. No obvious neuro-muscular anomalies detected. Sensory (Neurological): Unimpaired Palpation: No palpable anomalies       Provocative Tests: Hyperextension/rotation test: deferred today       Lumbar quadrant test (Kemp's test): deferred today       Lateral bending test: deferred today       Patrick's Maneuver: deferred today                    Gait & Posture Assessment  Ambulation: Unassisted Gait: Relatively normal for age and body habitus Posture: WNL  Lower Extremity Exam    Side: Right lower extremity  Side: Left lower extremity  Stability: No instability observed          Stability: No instability observed          Skin & Extremity Inspection: Skin color, temperature, and hair growth are WNL. No peripheral edema or cyanosis. No masses, redness, swelling, asymmetry, or associated skin lesions. No contractures.  Skin & Extremity Inspection: Skin color, temperature, and hair growth are WNL. No peripheral edema or cyanosis. No masses, redness, swelling, asymmetry, or associated skin lesions. No contractures.  Functional ROM: Unrestricted ROM                  Functional ROM: Unrestricted ROM                  Muscle Tone/Strength: Functionally intact. No obvious neuro-muscular anomalies detected.  Muscle Tone/Strength: Functionally intact. No obvious neuro-muscular anomalies detected.  Sensory (Neurological): Unimpaired        Sensory (Neurological): Unimpaired             Assessment   Status Diagnosis  Persistent Persistent Persistent 1. Lumbar degenerative disc  disease   2. Lumbar spondylosis   3. Chronic bilateral low back pain with bilateral sciatica   4. Chronic pain syndrome      Updated Problems: No problems updated.  Plan of Care  Pharmacotherapy (Medications Ordered): Meds ordered this encounter  Medications  . HYDROcodone-acetaminophen (NORCO) 10-325 MG tablet    Sig: Take 1 tablet by mouth every 12 (twelve) hours as needed for up to 30 days for severe pain.    Dispense:  60 tablet    Refill:  0    Do not place this medication on "Automatic Refill". Patient may have prescription filled one day early if pharmacy is closed on scheduled refill date.    Order Specific Question:   Supervising Provider  AnswerMilinda Pointer [[id number]]  . tiZANidine (ZANAFLEX) 4 MG tablet    Sig: Take 1 tablet (4 mg total) by mouth every 8 (eight) hours as needed for up to 30 days for muscle spasms.    Dispense:  90 tablet    Refill:  0    Order Specific Question:   Supervising Provider    Answer:   Milinda Pointer [phone]  . gabapentin (NEURONTIN) 300 MG capsule    Sig: Take 1 capsule (300 mg total) by mouth 3 (three) times daily. At bedtime    Dispense:  90 capsule    Refill:  0    Order Specific Question:   Supervising Provider    Answer:   Milinda Pointer [phone]   Administered today: Idalia Needle. Secrist had no medications administered during this visit.  Orders:  No orders of the defined types were placed in this encounter.  Follow-up plan:   Return in about 3 weeks (around 07/20/2018) for MedMgmt.. Not at this time.    Note by: Dionisio David, NP Date: 06/29/2018; Time: 10:11 PM

## 2018-06-29 NOTE — Patient Instructions (Signed)
____________________________________________________________________________________________  Medication Rules  Purpose: To inform patients, and their family members, of our rules and regulations.  Applies to: All patients receiving prescriptions (written or electronic).  Pharmacy of record: Pharmacy where electronic prescriptions will be sent. If written prescriptions are taken to a different pharmacy, please inform the nursing staff. The pharmacy listed in the electronic medical record should be the one where you would like electronic prescriptions to be sent.  Electronic prescriptions: In compliance with the [place] Strengthen Opioid Misuse Prevention (STOP) Act of 2017 (Session Law 2017-74/H243), effective April 26, 2018, all controlled substances must be electronically prescribed. Calling prescriptions to the pharmacy will cease to exist.  Prescription refills: Only during scheduled appointments. Applies to all prescriptions.  NOTE: The following applies primarily to controlled substances (Opioid* Pain Medications).   Patient's responsibilities: 1. Pain Pills: Bring all pain pills to every appointment (except for procedure appointments). 2. Pill Bottles: Bring pills in original pharmacy bottle. Always bring the newest bottle. Bring bottle, even if empty. 3. Medication refills: You are responsible for knowing and keeping track of what medications you take and those you need refilled. The day before your appointment: write a list of all prescriptions that need to be refilled. The day of the appointment: give the list to the admitting nurse. Prescriptions will be written only during appointments. No prescriptions will be written on procedure days. If you forget a medication: it will not be "Called in", "Faxed", or "electronically sent". You will need to get another appointment to get these prescribed. No early refills. Do not call asking to have your prescription filled  early. 4. Prescription Accuracy: You are responsible for carefully inspecting your prescriptions before leaving our office. Have the discharge nurse carefully go over each prescription with you, before taking them home. Make sure that your name is accurately spelled, that your address is correct. Check the name and dose of your medication to make sure it is accurate. Check the number of pills, and the written instructions to make sure they are clear and accurate. Make sure that you are given enough medication to last until your next medication refill appointment. 5. Taking Medication: Take medication as prescribed. When it comes to controlled substances, taking less pills or less frequently than prescribed is permitted and encouraged. Never take more pills than instructed. Never take medication more frequently than prescribed.  6. Inform other Doctors: Always inform, all of your healthcare providers, of all the medications you take. 7. Pain Medication from other Providers: You are not allowed to accept any additional pain medication from any other Doctor or Healthcare provider. There are two exceptions to this rule. (see below) In the event that you require additional pain medication, you are responsible for notifying us, as stated below. 8. Medication Agreement: You are responsible for carefully reading and following our Medication Agreement. This must be signed before receiving any prescriptions from our practice. Safely store a copy of your signed Agreement. Violations to the Agreement will result in no further prescriptions. (Additional copies of our Medication Agreement are available upon request.) 9. Laws, Rules, & Regulations: All patients are expected to follow all Federal and State Laws, Statutes, Rules, & Regulations. Ignorance of the Laws does not constitute a valid excuse. The use of any illegal substances is prohibited. 10. Adopted CDC guidelines & recommendations: Target dosing levels will be  at or below 60 MME/day. Use of benzodiazepines** is not recommended.  Exceptions: There are only two exceptions to the rule of not   receiving pain medications from other Healthcare Providers. 1. Exception #1 (Emergencies): In the event of an emergency (i.e.: accident requiring emergency care), you are allowed to receive additional pain medication. However, you are responsible for: As soon as you are able, call our office [phone], at any time of the day or night, and leave a message stating your name, the date and nature of the emergency, and the name and dose of the medication prescribed. In the event that your call is answered by a member of our staff, make sure to document and save the date, time, and the name of the person that took your information.  2. Exception #2 (Planned Surgery): In the event that you are scheduled by another doctor or dentist to have any type of surgery or procedure, you are allowed (for a period no longer than 30 days), to receive additional pain medication, for the acute post-op pain. However, in this case, you are responsible for picking up a copy of our "Post-op Pain Management for Surgeons" handout, and giving it to your surgeon or dentist. This document is available at our office, and does not require an appointment to obtain it. Simply go to our office during business hours (Monday-Thursday from 8:00 AM to 4:00 PM) (Friday 8:00 AM to 12:00 Noon) or if you have a scheduled appointment with us, prior to your surgery, and ask for it by name. In addition, you will need to provide us with your name, name of your surgeon, type of surgery, and date of procedure or surgery.  *Opioid medications include: morphine, codeine, oxycodone, oxymorphone, hydrocodone, hydromorphone, meperidine, tramadol, tapentadol, buprenorphine, fentanyl, methadone. **Benzodiazepine medications include: diazepam (Valium), alprazolam (Xanax), clonazepam (Klonopine), lorazepam (Ativan), clorazepate  (Tranxene), chlordiazepoxide (Librium), estazolam (Prosom), oxazepam (Serax), temazepam (Restoril), triazolam (Halcion) (Last updated: 06/23/2017) ____________________________________________________________________________________________    

## 2018-07-06 ENCOUNTER — Encounter: Payer: Self-pay | Admitting: Family Medicine

## 2018-07-06 ENCOUNTER — Ambulatory Visit: Payer: BLUE CROSS/BLUE SHIELD | Admitting: Family Medicine

## 2018-07-06 ENCOUNTER — Other Ambulatory Visit: Payer: Self-pay

## 2018-07-06 VITALS — BP 118/76 | HR 95 | Temp 97.3°F | Resp 14 | Ht 73.0 in | Wt 206.2 lb

## 2018-07-06 DIAGNOSIS — R112 Nausea with vomiting, unspecified: Secondary | ICD-10-CM

## 2018-07-06 DIAGNOSIS — R197 Diarrhea, unspecified: Secondary | ICD-10-CM | POA: Diagnosis not present

## 2018-07-06 MED ORDER — ONDANSETRON HCL 4 MG PO TABS: 4.0000 mg | ORAL_TABLET | Freq: Three times a day (TID) | ORAL | 0 refills | Status: DC | PRN

## 2018-07-06 MED ORDER — LOPERAMIDE HCL 2 MG PO TABS: 2.0000 mg | ORAL_TABLET | Freq: Three times a day (TID) | ORAL | 0 refills | Status: DC | PRN

## 2018-07-06 NOTE — Patient Instructions (Signed)
Bland Diet  A bland diet consists of foods that are often soft and do not have a lot of fat, fiber, or extra seasonings. Foods without fat, fiber, or seasoning are easier for the body to digest. They are also less likely to irritate your mouth, throat, stomach, and other parts of your digestive system. A bland diet is sometimes called a BRAT diet.  What is my plan?  Your health care provider or food and nutrition specialist (dietitian) may recommend specific changes to your diet to prevent symptoms or to treat your symptoms. These changes may include:  · Eating small meals often.  · Cooking food until it is soft enough to chew easily.  · Chewing your food well.  · Drinking fluids slowly.  · Not eating foods that are very spicy, sour, or fatty.  · Not eating citrus fruits, such as oranges and grapefruit.  What do I need to know about this diet?  · Eat a variety of foods from the bland diet food list.  · Do not follow a bland diet longer than needed.  · Ask your health care provider whether you should take vitamins or supplements.  What foods can I eat?  Grains    Hot cereals, such as cream of wheat. Rice. Bread, crackers, or tortillas made from refined white flour.  Vegetables  Canned or cooked vegetables. Mashed or boiled potatoes.  Fruits    Bananas. Applesauce. Other types of cooked or canned fruit with the skin and seeds removed, such as canned peaches or pears.  Meats and other proteins    Scrambled eggs. Creamy peanut butter or other nut butters. Lean, well-cooked meats, such as chicken or fish. Tofu. Soups or broths.  Dairy  Low-fat dairy products, such as milk, cottage cheese, or yogurt.  Beverages    Water. Herbal tea. Apple juice.  Fats and oils  Mild salad dressings. [place] or olive oil.  Sweets and desserts  Pudding. Custard. Fruit gelatin. Ice cream.  The items listed above may not be a complete list of recommended foods and beverages. Contact a dietitian for more options.  What foods are not  recommended?  Grains  Whole grain breads and cereals.  Vegetables  Raw vegetables.  Fruits  Raw fruits, especially citrus, berries, or dried fruits.  Dairy  Whole fat dairy foods.  Beverages  Caffeinated drinks. Alcohol.  Seasonings and condiments  Strongly flavored seasonings or condiments. Hot sauce. Salsa.  Other foods  Spicy foods. Fried foods. Sour foods, such as pickled or fermented foods. Foods with high sugar content. Foods high in fiber.  The items listed above may not be a complete list of foods and beverages to avoid. Contact a dietitian for more information.  Summary  · A bland diet consists of foods that are often soft and do not have a lot of fat, fiber, or extra seasonings.  · Foods without fat, fiber, or seasoning are easier for the body to digest.  · Check with your health care provider to see how long you should follow this diet plan. It is not meant to be followed for long periods.  This information is not intended to replace advice given to you by your health care provider. Make sure you discuss any questions you have with your health care provider.  Document Released: 08/04/2015 Document Revised: 05/11/2017 Document Reviewed: 05/11/2017  Elsevier Interactive Patient Education © 2019 [employer]  Diarrhea, Adult  Diarrhea is when you pass loose and watery poop (stool) often.   Diarrhea can make you feel weak and cause you to lose water in your body (get dehydrated). Losing water in your body can cause you to:  · Feel tired and thirsty.  · Have a dry mouth.  · Go pee (urinate) less often.  Diarrhea often lasts 2-3 days. However, it can last longer if it is a sign of something more serious. It is important to treat your diarrhea as told by your doctor.  Follow these instructions at home:  Eating and drinking         Follow these instructions as told by your doctor:  · Take an ORS (oral rehydration solution). This is a drink that helps you replace fluids and minerals your body lost. It is sold at  pharmacies and stores.  · Drink plenty of fluids, such as:  ? Water.  ? Ice chips.  ? Diluted fruit juice.  ? Low-calorie sports drinks.  ? Milk, if you want.  · Avoid drinking fluids that have a lot of sugar or caffeine in them.  · Eat bland, easy-to-digest foods in small amounts as you are able. These foods include:  ? Bananas.  ? Applesauce.  ? Rice.  ? Low-fat (lean) meats.  ? Toast.  ? Crackers.  · Avoid alcohol.  · Avoid spicy or fatty foods.    Medicines  · Take over-the-counter and prescription medicines only as told by your doctor.  · If you were prescribed an antibiotic medicine, take it as told by your doctor. Do not stop using the antibiotic even if you start to feel better.  General instructions    · Wash your hands often using soap and water. If soap and water are not available, use a hand sanitizer. Others in your home should wash their hands as well. Hands should be washed:  ? After using the toilet or changing a diaper.  ? Before preparing, cooking, or serving food.  ? While caring for a sick person.  ? While visiting someone in a hospital.  · Drink enough fluid to keep your pee (urine) pale yellow.  · Rest at home while you get better.  · Watch your condition for any changes.  · Take a warm bath to help with any burning or pain from having diarrhea.  · Keep all follow-up visits as told by your doctor. This is important.  Contact a doctor if:  · You have a fever.  · Your diarrhea gets worse.  · You have new symptoms.  · You cannot keep fluids down.  · You feel light-headed or dizzy.  · You have a headache.  · You have muscle cramps.  Get help right away if:  · You have chest pain.  · You feel very weak or you pass out (faint).  · You have bloody or black poop or poop that looks like tar.  · You have very bad pain, cramping, or bloating in your belly (abdomen).  · You have trouble breathing or you are breathing very quickly.  · Your heart is beating very quickly.  · Your skin feels cold and  clammy.  · You feel confused.  · You have signs of losing too much water in your body, such as:  ? Dark pee, very little pee, or no pee.  ? Cracked lips.  ? Dry mouth.  ? Sunken eyes.  ? Sleepiness.  ? Weakness.  Summary  · Diarrhea is when you pass loose and watery poop (stool) often.  · Diarrhea

## 2018-07-06 NOTE — Progress Notes (Signed)
Name: Wesley Adams   MRN: [id number]    DOB: 07-Sep-1965   Date:07/06/2018       Progress Note  Subjective  Chief Complaint  Chief Complaint  Patient presents with  . GI Problem    onset 2 days, nausea, vomting, diarrhea    HPI  PT presents with concern for GI upset x2 days - 5 episodes of vomiting, 6 episodes of diarrhea in the last 24 hours.  He has ongoing nausea.  Denies blood in stool or emesis, abdominal pain, fevers, chills, or body aches.  No one around him with recent sick contacts. Tolerating PO fluids without any issue.  Patient Active Problem List   Diagnosis Date Noted  . Lumbar spondylosis 05/03/2018  . Chronic pain of right lower extremity 05/03/2018  . Coronary artery disease involving native heart 12/12/2017  . Chest pain 12/06/2017  . Iliac artery stenosis, left ([place]) 12/06/2017  . Atherosclerosis of aorta ([place]) 12/06/2017  . Long term current use of opiate analgesic 11/03/2017  . Chronic bilateral low back pain with bilateral sciatica 10/03/2017  . Chronic pain syndrome 10/03/2017  . Opiate use 10/03/2017  . Epigastric pain   . Special screening for malignant neoplasms, colon   . Chronic, continuous use of opioids 05/27/2017  . Tobacco use 05/27/2017  . Spinal cord stimulator status 04/29/2017  . Acute low back pain without sciatica 11/04/2015  . Lumbar degenerative disc disease 01/30/2015    Social History   Tobacco Use  . Smoking status: Current Every Day Smoker    Packs/day: 1.00    Years: 30.00    Pack years: 30.00    Types: Cigarettes  . Smokeless tobacco: Never Used  . Tobacco comment: 1/2 pack a day  Substance Use Topics  . Alcohol use: Not Currently    Alcohol/week: 0.0 standard drinks    Comment: 12/05/17     Current Outpatient Medications:  .  acetaminophen (TYLENOL) 325 MG tablet, Take 2 tablets (650 mg total) by mouth every 6 (six) hours as needed for mild pain (or Fever >/= 101)., Disp: , Rfl:  .  aspirin EC 81 MG EC  tablet, Take 1 tablet (81 mg total) by mouth daily., Disp: , Rfl:  .  atorvastatin (LIPITOR) 40 MG tablet, Take 1 tablet (40 mg total) by mouth daily., Disp: 90 tablet, Rfl: 1 .  gabapentin (NEURONTIN) 300 MG capsule, Take 1 capsule (300 mg total) by mouth 3 (three) times daily. At bedtime (Patient taking differently: Take 300 mg by mouth at bedtime. At bedtime), Disp: 90 capsule, Rfl: 0 .  HYDROcodone-acetaminophen (NORCO) 10-325 MG tablet, Take 1 tablet by mouth every 12 (twelve) hours as needed for up to 30 days for severe pain., Disp: 60 tablet, Rfl: 0 .  tiZANidine (ZANAFLEX) 4 MG tablet, Take 1 tablet (4 mg total) by mouth every 8 (eight) hours as needed for up to 30 days for muscle spasms., Disp: 90 tablet, Rfl: 0  No Known Allergies  I personally reviewed active problem list, medication list, allergies, lab results with the patient/caregiver today.  ROS  Ten systems reviewed and is negative except as mentioned in HPI.  Objective  Vitals:   07/06/18 1123  BP: 118/76  Pulse: 95  Resp: 14  Temp: (!) 97.3 F (36.3 C)  TempSrc: Oral  SpO2: 98%  Weight: 206 lb 3.2 oz (93.5 kg)  Height: 6\' 1"  (1.854 m)    Body mass index is 27.2 kg/m.  Nursing Note and Vital Signs  reviewed.  Physical Exam  Constitutional: Patient appears well-developed and well-nourished. No distress.  HENT: Head: Normocephalic and atraumatic. Eyes: Conjunctivae and EOM are normal. No scleral icterus. Neck: Normal range of motion. Neck supple. No JVD present. No thyromegaly present.  Cardiovascular: Normal rate, regular rhythm and normal heart sounds.  No murmur heard. No BLE edema. Pulmonary/Chest: Effort normal and breath sounds normal. No respiratory distress. Abdominal: Soft. Bowel sounds are normal, no distension. There is no tenderness. No masses. Musculoskeletal: Normal range of motion, no joint effusions. No gross deformities Neurological: Pt is alert and oriented to person, place, and time. No  cranial nerve deficit. Coordination, balance, strength, speech and gait are normal.  Skin: Skin is warm and dry. No rash noted. No erythema.  Psychiatric: Patient has a normal mood and affect. behavior is normal. Judgment and thought content normal.  No results found for this or any previous visit (from the past 72 hour(s)).  Assessment & Plan  1. Diarrhea of presumed infectious origin - Gastrointestinal Pathogen Panel PCR - loperamide (IMODIUM A-D) 2 MG tablet; Take 1 tablet (2 mg total) by mouth 3 (three) times daily as needed for diarrhea or loose stools.  Dispense: 20 tablet; Refill: 0  2. Non-intractable vomiting with nausea, unspecified vomiting type - Gastrointestinal Pathogen Panel PCR - ondansetron (ZOFRAN) 4 MG tablet; Take 1 tablet (4 mg total) by mouth every 8 (eight) hours as needed for nausea or vomiting.  Dispense: 30 tablet; Refill: 0  - Advised to avoid work until diarrhea free for 24 hours.  We will provide work note.   -Red flags and when to present for emergency care or RTC including fever >101.76F, chest pain, shortness of breath, new/worsening/un-resolving symptoms, abdominal pain, intractable vomiting or diarrhea, fatigue/lethargy reviewed with patient at time of visit. Follow up and care instructions discussed and provided in AVS.

## 2018-07-20 ENCOUNTER — Other Ambulatory Visit: Payer: Self-pay

## 2018-07-20 ENCOUNTER — Ambulatory Visit: Payer: BLUE CROSS/BLUE SHIELD | Attending: Nurse Practitioner | Admitting: Nurse Practitioner

## 2018-07-20 DIAGNOSIS — M48061 Spinal stenosis, lumbar region without neurogenic claudication: Secondary | ICD-10-CM

## 2018-07-20 DIAGNOSIS — M5441 Lumbago with sciatica, right side: Secondary | ICD-10-CM

## 2018-07-20 DIAGNOSIS — M5442 Lumbago with sciatica, left side: Secondary | ICD-10-CM | POA: Diagnosis not present

## 2018-07-20 DIAGNOSIS — G8929 Other chronic pain: Secondary | ICD-10-CM | POA: Diagnosis not present

## 2018-07-20 DIAGNOSIS — M79604 Pain in right leg: Secondary | ICD-10-CM

## 2018-07-20 DIAGNOSIS — G894 Chronic pain syndrome: Secondary | ICD-10-CM

## 2018-07-20 DIAGNOSIS — M47816 Spondylosis without myelopathy or radiculopathy, lumbar region: Secondary | ICD-10-CM

## 2018-07-20 MED ORDER — HYDROCODONE-ACETAMINOPHEN 10-325 MG PO TABS: 1.0000 | ORAL_TABLET | Freq: Two times a day (BID) | ORAL | 0 refills | Status: DC | PRN

## 2018-07-20 MED ORDER — GABAPENTIN 300 MG PO CAPS: 300.0000 mg | ORAL_CAPSULE | Freq: Three times a day (TID) | ORAL | 0 refills | Status: DC

## 2018-07-20 MED ORDER — TIZANIDINE HCL 4 MG PO TABS: 4.0000 mg | ORAL_TABLET | Freq: Three times a day (TID) | ORAL | 0 refills | Status: DC | PRN

## 2018-07-20 NOTE — Patient Instructions (Signed)
____________________________________________________________________________________________  Medication Rules  Purpose: To inform patients, and their family members, of our rules and regulations.  Applies to: All patients receiving prescriptions (written or electronic).  Pharmacy of record: Pharmacy where electronic prescriptions will be sent. If written prescriptions are taken to a different pharmacy, please inform the nursing staff. The pharmacy listed in the electronic medical record should be the one where you would like electronic prescriptions to be sent.  Electronic prescriptions: In compliance with the [place] Strengthen Opioid Misuse Prevention (STOP) Act of 2017 (Session Law 2017-74/H243), effective April 26, 2018, all controlled substances must be electronically prescribed. Calling prescriptions to the pharmacy will cease to exist.  Prescription refills: Only during scheduled appointments. Applies to all prescriptions.  NOTE: The following applies primarily to controlled substances (Opioid* Pain Medications).   Patient's responsibilities: 1. Pain Pills: Bring all pain pills to every appointment (except for procedure appointments). 2. Pill Bottles: Bring pills in original pharmacy bottle. Always bring the newest bottle. Bring bottle, even if empty. 3. Medication refills: You are responsible for knowing and keeping track of what medications you take and those you need refilled. The day before your appointment: write a list of all prescriptions that need to be refilled. The day of the appointment: give the list to the admitting nurse. Prescriptions will be written only during appointments. No prescriptions will be written on procedure days. If you forget a medication: it will not be "Called in", "Faxed", or "electronically sent". You will need to get another appointment to get these prescribed. No early refills. Do not call asking to have your prescription filled  early. 4. Prescription Accuracy: You are responsible for carefully inspecting your prescriptions before leaving our office. Have the discharge nurse carefully go over each prescription with you, before taking them home. Make sure that your name is accurately spelled, that your address is correct. Check the name and dose of your medication to make sure it is accurate. Check the number of pills, and the written instructions to make sure they are clear and accurate. Make sure that you are given enough medication to last until your next medication refill appointment. 5. Taking Medication: Take medication as prescribed. When it comes to controlled substances, taking less pills or less frequently than prescribed is permitted and encouraged. Never take more pills than instructed. Never take medication more frequently than prescribed.  6. Inform other Doctors: Always inform, all of your healthcare providers, of all the medications you take. 7. Pain Medication from other Providers: You are not allowed to accept any additional pain medication from any other Doctor or Healthcare provider. There are two exceptions to this rule. (see below) In the event that you require additional pain medication, you are responsible for notifying us, as stated below. 8. Medication Agreement: You are responsible for carefully reading and following our Medication Agreement. This must be signed before receiving any prescriptions from our practice. Safely store a copy of your signed Agreement. Violations to the Agreement will result in no further prescriptions. (Additional copies of our Medication Agreement are available upon request.) 9. Laws, Rules, & Regulations: All patients are expected to follow all Federal and State Laws, Statutes, Rules, & Regulations. Ignorance of the Laws does not constitute a valid excuse. The use of any illegal substances is prohibited. 10. Adopted CDC guidelines & recommendations: Target dosing levels will be  at or below 60 MME/day. Use of benzodiazepines** is not recommended.  Exceptions: There are only two exceptions to the rule of not   receiving pain medications from other Healthcare Providers. 1. Exception #1 (Emergencies): In the event of an emergency (i.e.: accident requiring emergency care), you are allowed to receive additional pain medication. However, you are responsible for: As soon as you are able, call our office [phone], at any time of the day or night, and leave a message stating your name, the date and nature of the emergency, and the name and dose of the medication prescribed. In the event that your call is answered by a member of our staff, make sure to document and save the date, time, and the name of the person that took your information.  2. Exception #2 (Planned Surgery): In the event that you are scheduled by another doctor or dentist to have any type of surgery or procedure, you are allowed (for a period no longer than 30 days), to receive additional pain medication, for the acute post-op pain. However, in this case, you are responsible for picking up a copy of our "Post-op Pain Management for Surgeons" handout, and giving it to your surgeon or dentist. This document is available at our office, and does not require an appointment to obtain it. Simply go to our office during business hours (Monday-Thursday from 8:00 AM to 4:00 PM) (Friday 8:00 AM to 12:00 Noon) or if you have a scheduled appointment with [place], prior to your surgery, and ask for it by name. In addition, you will need to provide [place] with your name, name of your surgeon, type of surgery, and date of procedure or surgery.  *Opioid medications include: morphine, codeine, oxycodone, oxymorphone, hydrocodone, hydromorphone, meperidine, tramadol, tapentadol, buprenorphine, fentanyl, methadone. **Benzodiazepine medications include: diazepam (Valium), alprazolam (Xanax), clonazepam (Klonopine), lorazepam (Ativan), clorazepate  (Tranxene), chlordiazepoxide (Librium), estazolam (Prosom), oxazepam (Serax), temazepam (Restoril), triazolam (Halcion) (Last updated: 06/23/2017) ____________________________________________________________________________________________ prescribed. No early refills. Do not call asking to have your prescription filled early. 11. Prescription Accuracy: You are responsible for carefully inspecting your prescriptions before leaving our office. Have the discharge nurse carefully go over each prescription with you, before taking them home. Make sure that your name is accurately spelled, that your address is correct. Check the name and dose of your medication to make sure it is accurate. Check the number of pills, and the written instructions to make sure they are clear and accurate. Make sure that you are given enough medication to last until your next medication refill appointment. 12. Taking Medication: Take medication as prescribed. When it comes to controlled substances, taking less pills or less frequently than prescribed is permitted and encouraged. Never take more pills than instructed. Never take medication more frequently than prescribed.  13. Inform other Doctors: Always inform, all of your healthcare providers, of all the medications you take. 14. Pain Medication from other Providers: You are not allowed to accept any additional pain medication from any other Doctor or Healthcare provider. There are two exceptions to this rule. (see below) In the event that you require additional pain medication, you are responsible for notifying us, as stated below. 15. Medication Agreement: You are responsible for carefully reading and following our Medication Agreement. This must be signed before receiving any prescriptions from our practice. Safely store a copy of your signed Agreement. Violations to the Agreement will result in no further prescriptions. (Additional copies of our Medication Agreement are  available upon request.) 16. Laws, Rules, & Regulations: All patients are expected to follow all Federal and [employer], Rules, [employer]. Ignorance of the Laws does not constitute  a valid excuse. The use of any illegal substances is prohibited. 25. Adopted CDC guidelines & recommendations: Target dosing levels will be at or below 60 MME/day. Use of benzodiazepines** is not recommended.  Exceptions: There are only two exceptions to the rule of not receiving pain medications from other Healthcare Providers. 3. Exception #1 (Emergencies): In the event of an emergency (i.e.: accident requiring emergency care), you are allowed to receive additional pain medication. However, you are responsible for: As soon as you are able, call our office [phone], at any time of the day or night, and leave a message stating your name, the date and nature of the emergency, and the name and dose of the medication prescribed. In the event that your call is answered by a member of our staff, make sure to document and save the date, time, and the name of the person that took your information.  4. Exception #2 (Planned Surgery): In the event that you are scheduled by another doctor or dentist to have any type of surgery or procedure, you are allowed (for a period no longer than 30 days), to receive additional pain medication, for the acute post-op pain. However, in this case, you are responsible for picking up a copy of our "Post-op Pain Management for Surgeons" handout, and giving it to your surgeon or dentist. This document is available at our office, and does not require an appointment to obtain it. Simply go to our office during business hours (Monday-Thursday from 8:00 AM to 4:00 PM) (Friday 8:00 AM to 12:00 Noon) or if you have a scheduled appointment with [place], prior to your surgery, and ask for it by name. In addition, you will need to provide [place] with your name, name of your surgeon, type of surgery, and date  of procedure or surgery.  *Opioid medications include: morphine, codeine, oxycodone, oxymorphone, hydrocodone, hydromorphone, meperidine, tramadol, tapentadol, buprenorphine, fentanyl, methadone. **Benzodiazepine medications include: diazepam (Valium), alprazolam (Xanax), clonazepam (Klonopine), lorazepam (Ativan), clorazepate (Tranxene), chlordiazepoxide (Librium), estazolam (Prosom), oxazepam (Serax), temazepam (Restoril), triazolam (Halcion) (Last updated: 06/23/2017) ____________________________________________________________________________________________

## 2018-07-20 NOTE — Progress Notes (Signed)
Virtual Visit via Telephone Note  I connected with Wesley Adams on 07/20/18 at  9:00 AM EDT by telephone and verified that I am speaking with the correct person using two identifiers.   I discussed the limitations, risks, security and privacy concerns of performing an evaluation and management service by telephone and the availability of in person appointments. I also discussed with the patient that there may be a patient responsible charge related to this service. The patient expressed understanding and agreed to proceed.   History of Present Illness: His chief complaint is lower back pain. He admits that "his pain tolerance is high". His pain does not get under a 7/10. He feels like the last few days he has not been as active and this has helped with his pain. He still gets a "catch" in his back on the right. He admits that the pain does go down his right leg into the knee. It is sometimes sharper. He has numbness and tingling in his leg. He denies any weakness. He feels like his pain is stable for now. He admits that contineus to communicate with [facility] for the SCS. He continues to believe that he will not get good pain relief until he has the Spinal Cord Stimulator replaced. He does continue to work somewhat light duty on his job. He admits that he is waiting for an apt after the Pandemic is cleared.     He denies any side effects of his medication. He admits that he is taking his Hydrocodone/APAP 10/325mg  twice daily. He admits that he occasionally has to take an additional tablet.    He admits that he has some problems with his seasonal allergies.  Observations/Objective: Persistant lower back and right leg pain. The desire to have a the lumbar Spinal Cord Stimulator placed.  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 The patient was provided an opportunity to ask questions and all were answered. The patient agreed with the plan and demonstrated an understanding of the instructions.   The patient was advised to call back or seek an in-person evaluation if the symptoms worsen or if the condition fails to improve as anticipated.  I provided 12 minutes of non-face-to-face time during this encounter.   Dionisio David, NP

## 2018-08-02 ENCOUNTER — Other Ambulatory Visit: Payer: Self-pay

## 2018-08-02 ENCOUNTER — Ambulatory Visit: Payer: BLUE CROSS/BLUE SHIELD | Admitting: Family Medicine

## 2018-08-02 ENCOUNTER — Encounter: Payer: Self-pay | Admitting: Family Medicine

## 2018-08-02 VITALS — BP 108/60 | HR 99 | Temp 98.1°F | Resp 16 | Ht 73.0 in | Wt 209.5 lb

## 2018-08-02 DIAGNOSIS — H5789 Other specified disorders of eye and adnexa: Secondary | ICD-10-CM | POA: Diagnosis not present

## 2018-08-02 DIAGNOSIS — H1031 Unspecified acute conjunctivitis, right eye: Secondary | ICD-10-CM

## 2018-08-02 MED ORDER — CIPROFLOXACIN HCL 0.3 % OP SOLN: 2.0000 [drp] | OPHTHALMIC | 0 refills | Status: DC

## 2018-08-02 NOTE — Progress Notes (Signed)
Name: Wesley Adams   MRN: [id number]    DOB: 17-Jan-1966   Date:08/02/2018       Progress Note  Subjective  Chief Complaint  Chief Complaint  Patient presents with  . Eye Drainage    States he woke up this morning with his right eye being matted shut, tender, swollen, and hard to see out of.     HPI  Right eye discomfort: he noticed some redness and tenderness on right eye last night, woke up this morning and eye was matted shut with mucus discharge, he washed his face, used a warm cloth and went to work, however his supervisor sent him home. He denies any trauma, no pruritis , light causes discomfort. 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   Patient Active Problem List   Diagnosis Date Noted  . Lumbar spondylosis 05/03/2018  . Chronic pain of right lower extremity 05/03/2018  . Coronary artery disease involving native heart 12/12/2017  . Chest pain 12/06/2017  . Iliac artery stenosis, left ([place]) 12/06/2017  . Atherosclerosis of aorta ([place]) 12/06/2017  . Long term current use of opiate analgesic 11/03/2017  . Chronic bilateral low back pain with bilateral sciatica 10/03/2017  . Chronic pain syndrome 10/03/2017  . Opiate use 10/03/2017  . Epigastric pain   . Special screening for malignant neoplasms, colon   . Chronic, continuous use of opioids 05/27/2017  . Tobacco use 05/27/2017  . Spinal cord stimulator status 04/29/2017  . Acute low back pain without sciatica 11/04/2015  . Lumbar degenerative disc disease 01/30/2015    Social History   Tobacco Use  . Smoking status: Current Every Day Smoker    Packs/day: 1.00    Years: 35.00    Pack years: 35.00    Types: Cigarettes    Start date: 08/02/1983  . Smokeless tobacco: Never Used  Substance Use Topics  . Alcohol use: Yes    Alcohol/week: 0.0 standard drinks    Comment: occasionally drinks beer     Current Outpatient Medications:  .  aspirin EC  81 MG EC tablet, Take 1 tablet (81 mg total) by mouth daily., Disp: , Rfl:  .  atorvastatin (LIPITOR) 40 MG tablet, Take 1 tablet (40 mg total) by mouth daily., Disp: 90 tablet, Rfl: 1 .  gabapentin (NEURONTIN) 300 MG capsule, Take 1 capsule (300 mg total) by mouth 3 (three) times daily for 30 days. At bedtime, Disp: 90 capsule, Rfl: 0 .  HYDROcodone-acetaminophen (NORCO) 10-325 MG tablet, Take 1 tablet by mouth every 12 (twelve) hours as needed for up to 30 days for severe pain., Disp: 60 tablet, Rfl: 0 .  tiZANidine (ZANAFLEX) 4 MG tablet, Take 1 tablet (4 mg total) by mouth every 8 (eight) hours as needed for up to 30 days for muscle spasms., Disp: 90 tablet, Rfl: 0 .  acetaminophen (TYLENOL) 325 MG tablet, Take 2 tablets (650 mg total) by mouth every 6 (six) hours as needed for mild pain (or Fever >/= 101). (Patient not taking: Reported on 08/02/2018), Disp: , Rfl:  .  ciprofloxacin (CILOXAN) 0.3 % ophthalmic solution, Place 2 drops into the right eye every 2 (two) hours. Administer 1 drop, every 2 hours, while awake, for 2 days. Then 1 drop, every 4 hours, while awake, for the next 5 days., Disp: 5 mL, Rfl: 0  No Known Allergies  ROS  Ten systems reviewed and is negative  except as mentioned in HPI  Objective  Vitals:   08/02/18 1335  BP: 108/60  Pulse: 99  Resp: 16  Temp: 98.1 F (36.7 C)  TempSrc: Oral  SpO2: 97%  Weight: 209 lb 8 oz (95 kg)  Height: 6\' 1"  (1.854 m)    Body mass index is 27.64 kg/m.   Visual Acuity Screening   Right eye Left eye Both eyes  Without correction:     With correction: 20/25 20/20 20/20     Physical Exam  Constitutional: Patient appears well-developed and well-nourished. Overweight.  No distress.  HEENT: head atraumatic, normocephalic, puffy and painful right upper lid, erythema of conjunctiva on right upper corner of eye, pupils equal and reactive to light,neck supple, throat within normal limits Cardiovascular: Normal rate, regular rhythm and  normal heart sounds.  No murmur heard. No BLE edema. Pulmonary/Chest: Effort normal and breath sounds normal. No respiratory distress. Abdominal: Soft.  There is no tenderness. Psychiatric: Patient has a normal mood and affect. behavior is normal. Judgment and thought content normal.  Assessment & Plan   1. Eye swollen, right  - Visual acuity screening   2. Acute conjunctivitis of right eye, unspecified acute conjunctivitis type  - ciprofloxacin (CILOXAN) 0.3 % ophthalmic solution; Place 2 drops into the right eye every 2 (two) hours. Administer 1 drop, every 2 hours, while awake, for 2 days. Then 1 drop, every 4 hours, while awake, for the next 5 days.  Dispense: 5 mL; Refill: 0  Explained that symptoms should improve in the next 24 hours, and if worse he needs to call back and go see Ophthalmologist

## 2018-08-17 ENCOUNTER — Encounter: Payer: Self-pay | Admitting: Nurse Practitioner

## 2018-08-17 ENCOUNTER — Other Ambulatory Visit: Payer: Self-pay

## 2018-08-17 ENCOUNTER — Telehealth: Payer: Self-pay

## 2018-08-17 ENCOUNTER — Encounter: Payer: Self-pay | Admitting: Emergency Medicine

## 2018-08-17 ENCOUNTER — Ambulatory Visit: Payer: BLUE CROSS/BLUE SHIELD | Attending: Nurse Practitioner | Admitting: Nurse Practitioner

## 2018-08-17 ENCOUNTER — Ambulatory Visit (INDEPENDENT_AMBULATORY_CARE_PROVIDER_SITE_OTHER): Payer: BLUE CROSS/BLUE SHIELD | Admitting: Family Medicine

## 2018-08-17 ENCOUNTER — Encounter: Payer: Self-pay | Admitting: Family Medicine

## 2018-08-17 DIAGNOSIS — M51369 Other intervertebral disc degeneration, lumbar region without mention of lumbar back pain or lower extremity pain: Secondary | ICD-10-CM

## 2018-08-17 DIAGNOSIS — M48061 Spinal stenosis, lumbar region without neurogenic claudication: Secondary | ICD-10-CM | POA: Diagnosis not present

## 2018-08-17 DIAGNOSIS — G8929 Other chronic pain: Secondary | ICD-10-CM

## 2018-08-17 DIAGNOSIS — M5136 Other intervertebral disc degeneration, lumbar region: Secondary | ICD-10-CM | POA: Diagnosis not present

## 2018-08-17 DIAGNOSIS — G894 Chronic pain syndrome: Secondary | ICD-10-CM

## 2018-08-17 DIAGNOSIS — M47816 Spondylosis without myelopathy or radiculopathy, lumbar region: Secondary | ICD-10-CM | POA: Diagnosis not present

## 2018-08-17 DIAGNOSIS — M5441 Lumbago with sciatica, right side: Secondary | ICD-10-CM

## 2018-08-17 DIAGNOSIS — Z20822 Contact with and (suspected) exposure to covid-19: Secondary | ICD-10-CM

## 2018-08-17 DIAGNOSIS — Z20828 Contact with and (suspected) exposure to other viral communicable diseases: Secondary | ICD-10-CM

## 2018-08-17 DIAGNOSIS — M5442 Lumbago with sciatica, left side: Secondary | ICD-10-CM

## 2018-08-17 MED ORDER — HYDROCODONE-ACETAMINOPHEN 10-325 MG PO TABS: 1.0000 | ORAL_TABLET | Freq: Two times a day (BID) | ORAL | 0 refills | Status: DC | PRN

## 2018-08-17 MED ORDER — GABAPENTIN 300 MG PO CAPS: 300.0000 mg | ORAL_CAPSULE | Freq: Three times a day (TID) | ORAL | 0 refills | Status: DC

## 2018-08-17 MED ORDER — TIZANIDINE HCL 4 MG PO TABS: 4.0000 mg | ORAL_TABLET | Freq: Three times a day (TID) | ORAL | 0 refills | Status: AC | PRN

## 2018-08-17 NOTE — Progress Notes (Signed)
Name: Wesley Adams   MRN: [id number]    DOB: [birth year]   Date:08/17/2018       Progress Note  Subjective  Chief Complaint  Chief Complaint  Patient presents with  . Consult    Live in girlfriend positive for Covid 19. He was in quarantine for 14 days and need note for work    I connected with  Peri Jefferson  on 08/17/18 at 11:40 AM EDT by a video enabled telemedicine application and verified that I am speaking with the correct person using two identifiers.  I discussed the limitations of evaluation and management by telemedicine and the availability of in person appointments. The patient expressed understanding and agreed to proceed. Staff also discussed with the patient that there may be a patient responsible charge related to this service. Patient Location: Home Provider Location: Home Additional Individuals present: None  HPI  Pt presents to be cleared for work after 14-day quarantine due to his girlfriend being COVID19 positive. He reports no signs or symptoms - feeling well today.  No chest pain, shortness of breath, fevers, sore throat, nasal congestion, sore throat, or body aches.  We will clear him for work today.    Patient Active Problem List   Diagnosis Date Noted  . Lumbar spondylosis 05/03/2018  . Chronic pain of right lower extremity 05/03/2018  . Coronary artery disease involving native heart 12/12/2017  . Chest pain 12/06/2017  . Iliac artery stenosis, left ([place]) 12/06/2017  . Atherosclerosis of aorta ([place]) 12/06/2017  . Long term current use of opiate analgesic 11/03/2017  . Chronic bilateral low back pain with bilateral sciatica 10/03/2017  . Chronic pain syndrome 10/03/2017  . Opiate use 10/03/2017  . Epigastric pain   . Special screening for malignant neoplasms, colon   . Chronic, continuous use of opioids 05/27/2017  . Tobacco use 05/27/2017  . Spinal cord stimulator status 04/29/2017  . Acute low back pain without sciatica 11/04/2015  . Lumbar  degenerative disc disease 01/30/2015    Social History   Tobacco Use  . Smoking status: Current Every Day Smoker    Packs/day: 1.00    Years: 35.00    Pack years: 35.00    Types: Cigarettes    Start date: 08/02/1983  . Smokeless tobacco: Never Used  Substance Use Topics  . Alcohol use: Yes    Alcohol/week: 0.0 standard drinks    Comment: occasionally drinks beer     Current Outpatient Medications:  .  acetaminophen (TYLENOL) 325 MG tablet, Take 2 tablets (650 mg total) by mouth every 6 (six) hours as needed for mild pain (or Fever >/= 101)., Disp: , Rfl:  .  aspirin EC 81 MG EC tablet, Take 1 tablet (81 mg total) by mouth daily., Disp: , Rfl:  .  atorvastatin (LIPITOR) 40 MG tablet, Take 1 tablet (40 mg total) by mouth daily., Disp: 90 tablet, Rfl: 1 .  fluticasone (FLONASE) 50 MCG/ACT nasal spray, SPRAY 2 SPRAYS INTO EACH NOSTRIL EVERY DAY, Disp: , Rfl:  .  [START ON 08/31/2018] gabapentin (NEURONTIN) 300 MG capsule, Take 1 capsule (300 mg total) by mouth 3 (three) times daily for 30 days. At bedtime, Disp: 90 capsule, Rfl: 0 .  [START ON 08/31/2018] HYDROcodone-acetaminophen (NORCO) 10-325 MG tablet, Take 1 tablet by mouth every 12 (twelve) hours as needed for up to 30 days for severe pain., Disp: 60 tablet, Rfl: 0 .  [START ON 08/31/2018] tiZANidine (ZANAFLEX) 4 MG tablet, Take 1 tablet (4  mg total) by mouth every 8 (eight) hours as needed for up to 30 days for muscle spasms., Disp: 90 tablet, Rfl: 0 .  ciprofloxacin (CILOXAN) 0.3 % ophthalmic solution, Place 2 drops into the right eye every 2 (two) hours. Administer 1 drop, every 2 hours, while awake, for 2 days. Then 1 drop, every 4 hours, while awake, for the next 5 days. (Patient not taking: Reported on 08/17/2018), Disp: 5 mL, Rfl: 0  No Known Allergies  I personally reviewed active problem list, medication list, allergies with the patient/caregiver today.  ROS  Constitutional: Negative for fever or weight change.  Respiratory:  Negative for cough and shortness of breath.   Cardiovascular: Negative for chest pain or palpitations.  Gastrointestinal: Negative for abdominal pain, no bowel changes.  Musculoskeletal: Negative for gait problem or joint swelling.  Skin: Negative for rash.  Neurological: Negative for dizziness or headache.  No other specific complaints in a complete review of systems (except as listed in HPI above).  Objective  Virtual encounter, vitals not obtained.  There is no height or weight on file to calculate BMI.  Nursing Note and Vital Signs reviewed.  Physical Exam  Constitutional: Patient appears well-developed and well-nourished. No distress.  HENT: Head: Normocephalic and atraumatic.  Neck: Normal range of motion. Pulmonary/Chest: Effort normal. No respiratory distress. Speaking in complete sentences Neurological: Pt is alert and oriented to person, place, and time. Coordination, speech and gait are normal.  Psychiatric: Patient has a normal mood and affect. behavior is normal. Judgment and thought content normal.  No results found for this or any previous visit (from the past 72 hour(s)).  Assessment & Plan  1. Close Exposure to Covid-19 Virus Patient cleared to return to work tomorrow 08/21/2018.  Note to be provided in office for him to pick up.  -Red flags and when to present for emergency care or RTC including fever >101.28F, chest pain, shortness of breath, new/worsening/un-resolving symptoms, reviewed with patient at time of visit. Follow up and care instructions discussed and provided in AVS. - I discussed the assessment and treatment plan with the patient. The patient was provided an opportunity to ask questions and all were answered. The patient agreed with the plan and demonstrated an understanding of the instructions.  I provided 9 minutes of non-face-to-face time during this encounter.  Hubbard Hartshorn, FNP

## 2018-08-17 NOTE — Progress Notes (Signed)
Pain Management Encounter Note - Virtual Visit via Telephone Telehealth (real-time audio visits between healthcare provider and patient).  Patient's Phone No. & Preferred Pharmacy:  [phone] (home); [phone] (mobile); (Preferred) [phone]  [id number] - [place], [place] - [address] [place] [address] Phone: [phone] Fax: [phone]   Pre-screening note:  Our staff contacted Wesley Adams and offered him an "in person", "face-to-face" appointment versus a telephone encounter. He indicated preferring the telephone encounter, at this time.  Reason for Virtual Visit: COVID-19*  Social distancing based on CDC and AMA recommendations.   I contacted Wesley Adams on 08/17/2018 at 8:33 AM by telephone and clearly identified myself as Dionisio David, NP. I verified that I was speaking with the correct person using two identifiers (Name and date of birth: August 12, 1965).  Advanced Informed Consent I sought verbal advanced consent from Wesley Adams for telemedicine interactions and virtual visit. I informed Wesley Adams of the security and privacy concerns, risks, and limitations associated with performing an evaluation and management service by telephone. I also informed Wesley Adams of the availability of "in person" appointments and I informed him of the possibility of a patient responsible charge related to this service. Wesley Adams expressed understanding and agreed to proceed.   Historic Elements   Wesley Adams is a 53 y.o. year old, male patient evaluated today after his last encounter by our practice on 06/29/2018. Wesley Adams  has a past medical history of Chronic back pain and Flu. He also  has a past surgical history that includes Back surgery (Feb 2016); Spinal cord stimulator insertion (03/23/2017); Colonoscopy with propofol (N/A, 06/01/2017); Esophagogastroduodenoscopy (egd) with propofol (06/01/2017); polypectomy (06/01/2017); Spinal  cord stimulator removal (N/A, 06/16/2017); Fracture surgery (Right, as child); and LEFT HEART CATH AND CORONARY ANGIOGRAPHY (N/A, 12/08/2017). Wesley Adams has a current medication list which includes the following prescription(s): acetaminophen, aspirin, atorvastatin, ciprofloxacin, fluticasone, gabapentin, hydrocodone-acetaminophen, and tizanidine. He  reports that he has been smoking cigarettes. He started smoking about 35 years ago. He has a 35.00 pack-year smoking history. He has never used smokeless tobacco. He reports current alcohol use. He reports that he does not use drugs. Wesley Adams has No Known Allergies.   HPI  I last saw him on 06/29/2018. He is being evaluated for medication management. He has been under increased stress secondary to his fiance was diagnosed with COV-19.  She does work in Clinical research associate. He is not sure how she contracted the COV-19. He admits that he has been laying around because of having to be under quarantine for 10 days. He rates his lower back pain a 7/10. He has been taking his medication as directed during this time. He is aware that his pain has increased because he has not been working and he has been having to care for his 4 small children alone. He has been doing vitamins to help boost his immune system.Marland Kitchen   Pharmacotherapy Assessment  Analgesic: Hydrocodone/APAP 10/325 mg  MME/day: 20 mg/day.   Monitoring: Pharmacotherapy: No side-effects or adverse reactions reported. [place] PMP: PDMP not reviewed this encounter.       Compliance: No problems identified. Plan: Refer to "POC".  Review of recent tests  CT HEMATURIA WORKUP CLINICAL DATA:  Episodes of gross hematuria.  EXAM: CT ABDOMEN AND PELVIS WITHOUT AND WITH CONTRAST  TECHNIQUE: Multidetector CT imaging of the abdomen and pelvis was performed following the standard protocol before and following the bolus administration  of intravenous contrast.  CONTRAST:  165mL ISOVUE-300 IOPAMIDOL  (ISOVUE-300) INJECTION 61%  COMPARISON:  CT scan 04/29/2017  FINDINGS: Lower chest: The lung bases are clear of acute process. No pleural effusion or pulmonary lesions. The heart is normal in size. No pericardial effusion. Coronary artery calcifications are noted. The distal esophagus and aorta are unremarkable.  Hepatobiliary: No focal hepatic lesions or intrahepatic biliary dilatation. The gallbladder is normal. No common bile duct dilatation.  Pancreas: No mass, inflammation or ductal dilatation.  Spleen: Normal size.  No focal lesions.  Adrenals/Urinary Tract: Adrenal glands are unremarkable.  No renal, ureteral or bladder calculi. Both kidneys demonstrate normal enhancement/perfusion. No worrisome renal lesions. There is a stable 2 cm cyst in the midpole region right kidney. The delayed images do not demonstrate any significant collecting system abnormalities. Both ureters are normal. The bladder is normal.  Stomach/Bowel: The stomach, duodenum, small bowel and colon are grossly normal without oral contrast. No inflammatory changes, mass lesions or obstructive findings. The terminal ileum and appendix are normal.  Vascular/Lymphatic: Advanced atherosclerotic calcifications involving the aorta and iliac arteries but no focal aneurysm or dissection. The branch vessels are patent. The major venous structures are patent. No mesenteric or retroperitoneal mass or adenopathy.  Reproductive: The prostate gland and seminal vesicles are unremarkable.  Other: No pelvic mass or adenopathy. No free pelvic fluid collections. No inguinal mass or adenopathy. No abdominal wall hernia or subcutaneous lesions.  Musculoskeletal: A right femoral rod is noted. Both hips are normally located. Evidence of prior left iliac bone trauma with areas of heterotopic ossification.  Stable lumbar fusion hardware.  IMPRESSION: 1. No CT findings to account for the patient's hematuria. No  renal, ureteral or bladder calculi or mass. Stable right renal cyst. 2. No acute abdominal/pelvic findings, mass lesions or adenopathy. 3. Stable age advanced atherosclerotic calcifications involving the abdominal aorta and iliac arteries. Coronary artery calcifications are also noted  Electronically Signed   By: Marijo Sanes M.D.   On: 01/23/2018 15:57   Office Visit on 05/03/2018  Component Date Value Ref Range Status  . Summary 05/03/2018 FINAL   Final   Comment: ==================================================================== TOXASSURE SELECT 13 (MW) ==================================================================== Test                             Result       Flag       Units   NO DRUGS DETECTED. ==================================================================== Test                      Result    Flag   Units      Ref Range   Creatinine              256              mg/dL      >=20 ==================================================================== Declared Medications:  The flagging and interpretation on this report are based on the  following declared medications.  Unexpected results may arise from  inaccuracies in the declared medications.  **Note: The testing scope of this panel does not include following  reported medications:  Acetaminophen (Tylenol)  Aspirin  Atorvastatin (Lipitor)  Gabapentin (Neurontin) ==================================================================== For clinical consultation, pleas                          e call [phone]. ====================================================================    Assessment  The primary encounter diagnosis  was Lumbar spondylosis. 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  Plan of Care  I am having Idalia Needle. [place] maintain  his acetaminophen, aspirin, atorvastatin, gabapentin, HYDROcodone-acetaminophen, tiZANidine, ciprofloxacin, and fluticasone.  Pharmacotherapy (Medications Ordered): No orders of the defined types were placed in this encounter.  Orders:  No orders of the defined types were placed in this encounter.  Follow-up plan:   Return in about 4 weeks (around 09/14/2018) for MedMgmt.   I discussed the assessment and treatment plan with the patient. The patient was provided an opportunity to ask questions and all were answered. The patient agreed with the plan and demonstrated an understanding of the instructions.  Patient advised to call back or seek an in-person evaluation if the symptoms or condition worsens.  Total duration of non-face-to-face encounter: 15 minutes.  Note by: Dionisio David, NP Date: 08/17/2018; Time: 8:47 AM  Disclaimer:  * Given the special circumstances of the COVID-19 pandemic, the federal government has announced that the Office for Civil Rights (OCR) will exercise its enforcement discretion and will not impose penalties on physicians using telehealth in the event of noncompliance with regulatory requirements under the [place] (HIPAA) in connection with the good faith provision of telehealth during the VOHYW-73 national public health emergency. ([place])

## 2018-08-17 NOTE — Telephone Encounter (Signed)
Mailed AVS from 08/17/18 visit

## 2018-08-17 NOTE — Patient Instructions (Signed)
____________________________________________________________________________________________  Medication Rules  Purpose: To inform patients, and their family members, of our rules and regulations.  Applies to: All patients receiving prescriptions (written or electronic).  Pharmacy of record: Pharmacy where electronic prescriptions will be sent. If written prescriptions are taken to a different pharmacy, please inform the nursing staff. The pharmacy listed in the electronic medical record should be the one where you would like electronic prescriptions to be sent.  Electronic prescriptions: In compliance with the [place] Strengthen Opioid Misuse Prevention (STOP) Act of 2017 (Session Law 2017-74/H243), effective April 26, 2018, all controlled substances must be electronically prescribed. Calling prescriptions to the pharmacy will cease to exist.  Prescription refills: Only during scheduled appointments. Applies to all prescriptions.  NOTE: The following applies primarily to controlled substances (Opioid* Pain Medications).   Patient's responsibilities: 1. Pain Pills: Bring all pain pills to every appointment (except for procedure appointments). 2. Pill Bottles: Bring pills in original pharmacy bottle. Always bring the newest bottle. Bring bottle, even if empty. 3. Medication refills: You are responsible for knowing and keeping track of what medications you take and those you need refilled. The day before your appointment: write a list of all prescriptions that need to be refilled. The day of the appointment: give the list to the admitting nurse. Prescriptions will be written only during appointments. No prescriptions will be written on procedure days. If you forget a medication: it will not be "Called in", "Faxed", or "electronically sent". You will need to get another appointment to get these prescribed. No early refills. Do not call asking to have your prescription filled  early. 4. Prescription Accuracy: You are responsible for carefully inspecting your prescriptions before leaving our office. Have the discharge nurse carefully go over each prescription with you, before taking them home. Make sure that your name is accurately spelled, that your address is correct. Check the name and dose of your medication to make sure it is accurate. Check the number of pills, and the written instructions to make sure they are clear and accurate. Make sure that you are given enough medication to last until your next medication refill appointment. 5. Taking Medication: Take medication as prescribed. When it comes to controlled substances, taking less pills or less frequently than prescribed is permitted and encouraged. Never take more pills than instructed. Never take medication more frequently than prescribed.  6. Inform other Doctors: Always inform, all of your healthcare providers, of all the medications you take. 7. Pain Medication from other Providers: You are not allowed to accept any additional pain medication from any other Doctor or Healthcare provider. There are two exceptions to this rule. (see below) In the event that you require additional pain medication, you are responsible for notifying us, as stated below. 8. Medication Agreement: You are responsible for carefully reading and following our Medication Agreement. This must be signed before receiving any prescriptions from our practice. Safely store a copy of your signed Agreement. Violations to the Agreement will result in no further prescriptions. (Additional copies of our Medication Agreement are available upon request.) 9. Laws, Rules, & Regulations: All patients are expected to follow all Federal and State Laws, Statutes, Rules, & Regulations. Ignorance of the Laws does not constitute a valid excuse. The use of any illegal substances is prohibited. 10. Adopted CDC guidelines & recommendations: Target dosing levels will be  at or below 60 MME/day. Use of benzodiazepines** is not recommended.  Exceptions: There are only two exceptions to the rule of not   receiving pain medications from other Healthcare Providers. 1. Exception #1 (Emergencies): In the event of an emergency (i.e.: accident requiring emergency care), you are allowed to receive additional pain medication. However, you are responsible for: As soon as you are able, call our office [phone], at any time of the day or night, and leave a message stating your name, the date and nature of the emergency, and the name and dose of the medication prescribed. In the event that your call is answered by a member of our staff, make sure to document and save the date, time, and the name of the person that took your information.  2. Exception #2 (Planned Surgery): In the event that you are scheduled by another doctor or dentist to have any type of surgery or procedure, you are allowed (for a period no longer than 30 days), to receive additional pain medication, for the acute post-op pain. However, in this case, you are responsible for picking up a copy of our "Post-op Pain Management for Surgeons" handout, and giving it to your surgeon or dentist. This document is available at our office, and does not require an appointment to obtain it. Simply go to our office during business hours (Monday-Thursday from 8:00 AM to 4:00 PM) (Friday 8:00 AM to 12:00 Noon) or if you have a scheduled appointment with us, prior to your surgery, and ask for it by name. In addition, you will need to provide us with your name, name of your surgeon, type of surgery, and date of procedure or surgery.  *Opioid medications include: morphine, codeine, oxycodone, oxymorphone, hydrocodone, hydromorphone, meperidine, tramadol, tapentadol, buprenorphine, fentanyl, methadone. **Benzodiazepine medications include: diazepam (Valium), alprazolam (Xanax), clonazepam (Klonopine), lorazepam (Ativan), clorazepate  (Tranxene), chlordiazepoxide (Librium), estazolam (Prosom), oxazepam (Serax), temazepam (Restoril), triazolam (Halcion) (Last updated: 06/23/2017) ____________________________________________________________________________________________    

## 2018-08-23 ENCOUNTER — Other Ambulatory Visit: Payer: Self-pay | Admitting: Nurse Practitioner

## 2018-08-23 ENCOUNTER — Other Ambulatory Visit: Payer: Self-pay | Admitting: Family Medicine

## 2018-09-04 ENCOUNTER — Other Ambulatory Visit: Payer: Self-pay

## 2018-09-04 ENCOUNTER — Encounter: Payer: Self-pay | Admitting: Family Medicine

## 2018-09-04 ENCOUNTER — Ambulatory Visit (INDEPENDENT_AMBULATORY_CARE_PROVIDER_SITE_OTHER): Payer: BLUE CROSS/BLUE SHIELD | Admitting: Family Medicine

## 2018-09-04 ENCOUNTER — Encounter: Payer: Self-pay | Admitting: Emergency Medicine

## 2018-09-04 DIAGNOSIS — M5442 Lumbago with sciatica, left side: Secondary | ICD-10-CM

## 2018-09-04 DIAGNOSIS — M5136 Other intervertebral disc degeneration, lumbar region: Secondary | ICD-10-CM | POA: Diagnosis not present

## 2018-09-04 DIAGNOSIS — G894 Chronic pain syndrome: Secondary | ICD-10-CM | POA: Diagnosis not present

## 2018-09-04 DIAGNOSIS — G8929 Other chronic pain: Secondary | ICD-10-CM

## 2018-09-04 DIAGNOSIS — M5441 Lumbago with sciatica, right side: Secondary | ICD-10-CM

## 2018-09-04 DIAGNOSIS — M47816 Spondylosis without myelopathy or radiculopathy, lumbar region: Secondary | ICD-10-CM | POA: Diagnosis not present

## 2018-09-04 NOTE — Progress Notes (Signed)
Name: Wesley Adams   MRN: [id number]    DOB: Feb 27, 1966   Date:09/04/2018       Progress Note  Subjective  Chief Complaint  Chief Complaint  Patient presents with  . Consult    job related issues     I connected with  Peri Jefferson  on 09/04/18 at 11:20 AM EDT by a video enabled telemedicine application and verified that I am speaking with the correct person using two identifiers.  I discussed the limitations of evaluation and management by telemedicine and the availability of in person appointments. The patient expressed understanding and agreed to proceed. Staff also discussed with the patient that there may be a patient responsible charge related to this service. 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  - Of note he has had a spinal cord stimulator placed which was removed due to complications; he has Lumbar DDD and spondylosis, is on lng term pain management with Charlies Constable NP-C with pain management. 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  This has been somewhat tolerable, but today they told him he needed to start working in the freezer section and he does not feel this would be able to be tolerated due to his chronic back pain.  He has not worked in the freezer section yet - he was told to do this this morning and he declined - was told to obtain note from his provider.  - Will provide temporary note - needs to work in an area that is >50 degrees farenheit until cleared by Chronic Pain Management - has appointment 09/13/2018 and will discuss with [facility] NP-C then.  Patient Active Problem List   Diagnosis Date Noted  . Lumbar spondylosis 05/03/2018   . Chronic pain of right lower extremity 05/03/2018  . Coronary artery disease involving native heart 12/12/2017  . Chest pain 12/06/2017  . Iliac artery stenosis, left ([place]) 12/06/2017  . Atherosclerosis of aorta ([place]) 12/06/2017  . Long term current use of opiate analgesic 11/03/2017  . Chronic bilateral low back pain with bilateral sciatica 10/03/2017  . Chronic pain syndrome 10/03/2017  . Opiate use 10/03/2017  . Epigastric pain   . Special screening for malignant neoplasms, colon   . Chronic, continuous use of opioids 05/27/2017  . Tobacco use 05/27/2017  . Spinal cord stimulator status 04/29/2017  . Acute low back pain without sciatica 11/04/2015  . Lumbar degenerative disc disease 01/30/2015    Social History   Tobacco Use  . Smoking status: Current Every Day Smoker    Packs/day: 1.00    Years: 35.00    Pack years: 35.00    Types: Cigarettes    Start date: 08/02/1983  . Smokeless tobacco: Never Used  Substance Use Topics  . Alcohol use: Yes    Alcohol/week: 0.0 standard drinks    Comment: occasionally drinks beer     Current Outpatient Medications:  .  acetaminophen (TYLENOL) 325 MG tablet, Take 2 tablets (650 mg total) by mouth every 6 (six) hours as needed for mild pain (or Fever >/= 101)., Disp: , Rfl:  .  aspirin EC 81  MG EC tablet, Take 1 tablet (81 mg total) by mouth daily., Disp: , Rfl:  .  atorvastatin (LIPITOR) 40 MG tablet, Take 1 tablet (40 mg total) by mouth daily., Disp: 90 tablet, Rfl: 1 .  fluticasone (FLONASE) 50 MCG/ACT nasal spray, SPRAY 2 SPRAYS INTO EACH NOSTRIL EVERY DAY, Disp: 16 g, Rfl: 6 .  gabapentin (NEURONTIN) 300 MG capsule, Take 1 capsule (300 mg total) by mouth 3 (three) times daily for 30 days. At bedtime, Disp: 90 capsule, Rfl: 0 .  HYDROcodone-acetaminophen (NORCO) 10-325 MG tablet, Take 1 tablet by mouth every 12 (twelve) hours as needed for up to 30 days for severe pain., Disp: 60 tablet, Rfl: 0 .  tiZANidine (ZANAFLEX) 4 MG tablet,  Take 1 tablet (4 mg total) by mouth every 8 (eight) hours as needed for up to 30 days for muscle spasms., Disp: 90 tablet, Rfl: 0  No Known Allergies  I personally reviewed active problem list, medication list, allergies, health maintenance, notes from last encounter, lab results with the patient/caregiver today.  ROS  Constitutional: Negative for fever or weight change.  Respiratory: Negative for cough and shortness of breath.   Cardiovascular: Negative for chest pain or palpitations.  Gastrointestinal: Negative for abdominal pain, no bowel changes.  Musculoskeletal: Negative for gait problem or joint swelling.  Skin: Negative for rash.  Neurological: Negative for dizziness or headache.  No other specific complaints in a complete review of systems (except as listed in HPI above).  Objective  Virtual encounter, vitals not obtained.  There is no height or weight on file to calculate BMI.  Nursing Note and Vital Signs reviewed.  Physical Exam  Constitutional: Patient appears well-developed and well-nourished. No distress.  HENT: Head: Normocephalic and atraumatic.  Neck: Normal range of motion. Pulmonary/Chest: Effort normal. No respiratory distress. Speaking in complete sentences Neurological: Pt is alert and oriented to person, place, and time. Coordination, speech are normal.  Psychiatric: Patient has a normal mood and affect. behavior is normal. Judgment and thought content normal.  No results found for this or any previous visit (from the past 72 hour(s)).  Assessment & Plan  1. Chronic bilateral low back pain with bilateral sciatica 2. Lumbar spondylosis 3. Lumbar degenerative disc disease 4. Chronic pain syndrome - Will provide temporary note - needs to work in an area that is >50 degrees farenheit until cleared by Chronic Pain Management - has appointment 09/13/2018 and will discuss with Chronic Pain Management then - he verbalizes understanding that no additional notes  will be provided by our office after 09/13/2018.  -Red flags and when to present for emergency care or RTC including fever >101.28F, chest pain, shortness of breath, new/worsening/un-resolving symptoms, reviewed with patient at time of visit. Follow up and care instructions discussed and provided in AVS. - I discussed the assessment and treatment plan with the patient. The patient was provided an opportunity to ask questions and all were answered. The patient agreed with the plan and demonstrated an understanding of the instructions.  I provided 12 minutes of non-face-to-face time during this encounter.  Hubbard Hartshorn, FNP

## 2018-09-13 ENCOUNTER — Ambulatory Visit: Payer: BLUE CROSS/BLUE SHIELD | Attending: Nurse Practitioner | Admitting: Nurse Practitioner

## 2018-09-13 ENCOUNTER — Other Ambulatory Visit: Payer: Self-pay

## 2018-09-13 DIAGNOSIS — G894 Chronic pain syndrome: Secondary | ICD-10-CM | POA: Diagnosis not present

## 2018-09-13 DIAGNOSIS — M47816 Spondylosis without myelopathy or radiculopathy, lumbar region: Secondary | ICD-10-CM | POA: Diagnosis not present

## 2018-09-13 DIAGNOSIS — M51369 Other intervertebral disc degeneration, lumbar region without mention of lumbar back pain or lower extremity pain: Secondary | ICD-10-CM

## 2018-09-13 DIAGNOSIS — M5136 Other intervertebral disc degeneration, lumbar region: Secondary | ICD-10-CM

## 2018-09-13 DIAGNOSIS — M48061 Spinal stenosis, lumbar region without neurogenic claudication: Secondary | ICD-10-CM | POA: Diagnosis not present

## 2018-09-13 MED ORDER — HYDROCODONE-ACETAMINOPHEN 10-325 MG PO TABS: 1.0000 | ORAL_TABLET | Freq: Two times a day (BID) | ORAL | 0 refills | Status: DC

## 2018-09-13 MED ORDER — HYDROCODONE-ACETAMINOPHEN 10-325 MG PO TABS: 1.0000 | ORAL_TABLET | Freq: Two times a day (BID) | ORAL | 0 refills | Status: AC | PRN

## 2018-09-13 NOTE — Patient Instructions (Addendum)
____________________________________________________________________________________________  Medication Rules  Purpose: To inform patients, and their family members, of our rules and regulations.  Applies to: All patients receiving prescriptions (written or electronic).  Pharmacy of record: Pharmacy where electronic prescriptions will be sent. If written prescriptions are taken to a different pharmacy, please inform the nursing staff. The pharmacy listed in the electronic medical record should be the one where you would like electronic prescriptions to be sent.  Electronic prescriptions: In compliance with the [place] Strengthen Opioid Misuse Prevention (STOP) Act of 2017 (Session Law 2017-74/H243), effective April 26, 2018, all controlled substances must be electronically prescribed. Calling prescriptions to the pharmacy will cease to exist.  Prescription refills: Only during scheduled appointments. Applies to all prescriptions.  NOTE: The following applies primarily to controlled substances (Opioid* Pain Medications).   Patient's responsibilities: 1. Pain Pills: Bring all pain pills to every appointment (except for procedure appointments). 2. Pill Bottles: Bring pills in original pharmacy bottle. Always bring the newest bottle. Bring bottle, even if empty. 3. Medication refills: You are responsible for knowing and keeping track of what medications you take and those you need refilled. The day before your appointment: write a list of all prescriptions that need to be refilled. The day of the appointment: give the list to the admitting nurse. Prescriptions will be written only during appointments. No prescriptions will be written on procedure days. If you forget a medication: it will not be "Called in", "Faxed", or "electronically sent". You will need to get another appointment to get these prescribed. No early refills. Do not call asking to have your prescription filled  early. 4. Prescription Accuracy: You are responsible for carefully inspecting your prescriptions before leaving our office. Have the discharge nurse carefully go over each prescription with you, before taking them home. Make sure that your name is accurately spelled, that your address is correct. Check the name and dose of your medication to make sure it is accurate. Check the number of pills, and the written instructions to make sure they are clear and accurate. Make sure that you are given enough medication to last until your next medication refill appointment. 5. Taking Medication: Take medication as prescribed. When it comes to controlled substances, taking less pills or less frequently than prescribed is permitted and encouraged. Never take more pills than instructed. Never take medication more frequently than prescribed.  6. Inform other Doctors: Always inform, all of your healthcare providers, of all the medications you take. 7. Pain Medication from other Providers: You are not allowed to accept any additional pain medication from any other Doctor or Healthcare provider. There are two exceptions to this rule. (see below) In the event that you require additional pain medication, you are responsible for notifying us, as stated below. 8. Medication Agreement: You are responsible for carefully reading and following our Medication Agreement. This must be signed before receiving any prescriptions from our practice. Safely store a copy of your signed Agreement. Violations to the Agreement will result in no further prescriptions. (Additional copies of our Medication Agreement are available upon request.) 9. Laws, Rules, & Regulations: All patients are expected to follow all Federal and State Laws, Statutes, Rules, & Regulations. Ignorance of the Laws does not constitute a valid excuse. The use of any illegal substances is prohibited. 10. Adopted CDC guidelines & recommendations: Target dosing levels will be  at or below 60 MME/day. Use of benzodiazepines** is not recommended.  Exceptions: There are only two exceptions to the rule of not   receiving pain medications from other Healthcare Providers. 1. Exception #1 (Emergencies): In the event of an emergency (i.e.: accident requiring emergency care), you are allowed to receive additional pain medication. However, you are responsible for: As soon as you are able, call our office [phone], at any time of the day or night, and leave a message stating your name, the date and nature of the emergency, and the name and dose of the medication prescribed. In the event that your call is answered by a member of our staff, make sure to document and save the date, time, and the name of the person that took your information.  2. Exception #2 (Planned Surgery): In the event that you are scheduled by another doctor or dentist to have any type of surgery or procedure, you are allowed (for a period no longer than 30 days), to receive additional pain medication, for the acute post-op pain. However, in this case, you are responsible for picking up a copy of our "Post-op Pain Management for Surgeons" handout, and giving it to your surgeon or dentist. This document is available at our office, and does not require an appointment to obtain it. Simply go to our office during business hours (Monday-Thursday from 8:00 AM to 4:00 PM) (Friday 8:00 AM to 12:00 Noon) or if you have a scheduled appointment with us, prior to your surgery, and ask for it by name. In addition, you will need to provide us with your name, name of your surgeon, type of surgery, and date of procedure or surgery.  *Opioid medications include: morphine, codeine, oxycodone, oxymorphone, hydrocodone, hydromorphone, meperidine, tramadol, tapentadol, buprenorphine, fentanyl, methadone. **Benzodiazepine medications include: diazepam (Valium), alprazolam (Xanax), clonazepam (Klonopine), lorazepam (Ativan), clorazepate  (Tranxene), chlordiazepoxide (Librium), estazolam (Prosom), oxazepam (Serax), temazepam (Restoril), triazolam (Halcion) (Last updated: 06/23/2017) ____________________________________________________________________________________________    

## 2018-09-13 NOTE — Progress Notes (Signed)
Pain Management Encounter Note - Virtual Visit via Telephone Telehealth (real-time audio visits between healthcare provider and patient).  Patient's Phone No. & Preferred Pharmacy:  [phone] (home); [phone] (mobile); (Preferred) [phone]  [id number] - [place], [place] - [address] [place] [address] Phone: [phone] Fax: [phone]   Pre-screening note:  Our staff contacted [name] and offered him an "in person", "face-to-face" appointment versus a telephone encounter. He indicated preferring the telephone encounter, at this time.  Reason for Virtual Visit: COVID-19*  Social distancing based on CDC and AMA recommendations.   I contacted Wesley Adams on 09/13/2018 at 9:18 AM by telephone and clearly identified myself as Dionisio David, NP. I verified that I was speaking with the correct person using two identifiers (Name and date of birth: 03-15-1966).  Advanced Informed Consent I sought verbal advanced consent from Wesley Adams for telemedicine interactions and virtual visit. I informed Wesley Adams of the security and privacy concerns, risks, and limitations associated with performing an evaluation and management service by telephone. I also informed Wesley Adams of the availability of "in person" appointments and I informed him of the possibility of a patient responsible charge related to this service. Wesley Adams expressed understanding and agreed to proceed.   Historic Elements   Wesley Adams is a 53 y.o. year old, male patient evaluated today after his last encounter by our practice on 08/23/2018. 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 He also  has a past surgical history that includes Back surgery (Feb 2016); Spinal cord stimulator insertion (03/23/2017); Colonoscopy with propofol (N/A, 06/01/2017); Esophagogastroduodenoscopy (egd) with propofol  (06/01/2017); polypectomy (06/01/2017); Spinal cord stimulator removal (N/A, 06/16/2017); Fracture surgery (Right, as child); and LEFT HEART CATH AND CORONARY ANGIOGRAPHY (N/A, 12/08/2017). Wesley Adams has a current medication list which includes the following prescription(s): acetaminophen, aspirin, atorvastatin, fluticasone, gabapentin, hydrocodone-acetaminophen, hydrocodone-acetaminophen, and tizanidine. He  reports that he has been smoking cigarettes. He started smoking about 35 years ago. He has a 35.00 pack-year smoking history. He has never used smokeless tobacco. He reports current alcohol use. He reports that he does not use drugs. Wesley Adams has No Known Allergies.   HPI  I last saw him on 08/23/2018. He is being evaluated for medication management. He has 8/10 lower back pain. He feels like the stress and the weather has increased his pain. He admits that he has been out of work due to a change in his job and his inability to tolerate the temperature. He. continues to have pain and would like ot have the SCS replaced. He has called [facility] but with all the current changes he has not heard from the office recently. He feels like the medication dulls the pain. He is using his medication as directed BID now. He denies any side effects.     Pharmacotherapy Assessment  Analgesic: Hydrocodone/APAP 10/325 mg  MME/day: 20 mg/day.   Monitoring: Pharmacotherapy: No side-effects or adverse reactions reported. [place] PMP: PDMP reviewed during this encounter.       Compliance: No problems identified. Plan: Refer to "POC".  Review of recent tests  CT HEMATURIA WORKUP CLINICAL DATA:  Episodes of gross hematuria.  EXAM: CT ABDOMEN AND PELVIS WITHOUT AND WITH CONTRAST  TECHNIQUE: Multidetector CT imaging of the abdomen and pelvis was performed following the standard protocol before and following the bolus administration of intravenous contrast.  CONTRAST:  158mL ISOVUE-300 IOPAMIDOL (ISOVUE-300) INJECTION  61%  COMPARISON:  CT scan 04/29/2017  FINDINGS: Lower chest: The lung bases are clear of acute process. No pleural effusion or pulmonary lesions. The heart is normal in size. No pericardial effusion. Coronary artery calcifications are noted. The distal esophagus and aorta are unremarkable.  Hepatobiliary: No focal hepatic lesions or intrahepatic biliary dilatation. The gallbladder is normal. No common bile duct dilatation.  Pancreas: No mass, inflammation or ductal dilatation.  Spleen: Normal size.  No focal lesions.  Adrenals/Urinary Tract: Adrenal glands are unremarkable.  No renal, ureteral or bladder calculi. Both kidneys demonstrate normal enhancement/perfusion. No worrisome renal lesions. There is a stable 2 cm cyst in the midpole region right kidney. The delayed images do not demonstrate any significant collecting system abnormalities. Both ureters are normal. The bladder is normal.  Stomach/Bowel: The stomach, duodenum, small bowel and colon are grossly normal without oral contrast. No inflammatory changes, mass lesions or obstructive findings. The terminal ileum and appendix are normal.  Vascular/Lymphatic: Advanced atherosclerotic calcifications involving the aorta and iliac arteries but no focal aneurysm or dissection. The branch vessels are patent. The major venous structures are patent. No mesenteric or retroperitoneal mass or adenopathy.  Reproductive: The prostate gland and seminal vesicles are unremarkable.  Other: No pelvic mass or adenopathy. No free pelvic fluid collections. No inguinal mass or adenopathy. No abdominal wall hernia or subcutaneous lesions.  Musculoskeletal: A right femoral rod is noted. Both hips are normally located. Evidence of prior left iliac bone trauma with areas of heterotopic ossification.  Stable lumbar fusion hardware.  IMPRESSION: 1. No CT findings to account for the patient's hematuria. No renal, ureteral or bladder  calculi or mass. Stable right renal cyst. 2. No acute abdominal/pelvic findings, mass lesions or adenopathy. 3. Stable age advanced atherosclerotic calcifications involving the abdominal aorta and iliac arteries. Coronary artery calcifications are also noted  Electronically Signed   By: Marijo Sanes M.D.   On: 01/23/2018 15:57   Office Visit on 05/03/2018  Component Date Value Ref Range Status  . Summary 05/03/2018 FINAL   Final   Comment: ==================================================================== TOXASSURE SELECT 13 (MW) ==================================================================== Test                             Result       Flag       Units   NO DRUGS DETECTED. ==================================================================== Test                      Result    Flag   Units      Ref Range   Creatinine              256              mg/dL      >=20 ==================================================================== Declared Medications:  The flagging and interpretation on this report are based on the  following declared medications.  Unexpected results may arise from  inaccuracies in the declared medications.  **Note: The testing scope of this panel does not include following  reported medications:  Acetaminophen (Tylenol)  Aspirin  Atorvastatin (Lipitor)  Gabapentin (Neurontin) ==================================================================== For clinical consultation, pleas                          e call [phone]. ====================================================================    Assessment  The primary encounter diagnosis was Lumbar spondylosis. 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  Plan of Care  I have changed Wesley Adams's HYDROcodone-acetaminophen. I am also having him maintain his acetaminophen, aspirin,  atorvastatin, tiZANidine, gabapentin, fluticasone, and HYDROcodone-acetaminophen.  Pharmacotherapy (Medications Ordered): Meds ordered this encounter  Medications  . HYDROcodone-acetaminophen (NORCO) 10-325 MG tablet    Sig: Take 1 tablet by mouth every 12 (twelve) hours as needed for up to 30 days for severe pain.    Dispense:  60 tablet    Refill:  0    Do not place this medication on "Automatic Refill". Patient may have prescription filled one day early if pharmacy is closed on scheduled refill date.    Order Specific Question:   Supervising Provider    Answer:   Gillis Santa [[id number]]  . HYDROcodone-acetaminophen (NORCO) 10-325 MG tablet    Sig: Take 1 tablet by mouth 2 (two) times daily for 30 days.    Dispense:  60 tablet    Refill:  0    Do not place this medication, or any other prescription from our practice, on "Automatic Refill". Patient may have prescription filled one day early if pharmacy is closed on scheduled refill date.    Order Specific Question:   Supervising Provider    Answer:   Gillis Santa [[id number]]   Orders:  No orders of the defined types were placed in this encounter.  Follow-up plan:   Return in about 2 months (around 11/13/2018) for MedMgmt.   I discussed the assessment and treatment plan with the patient. The patient was provided an opportunity to ask questions and all were answered. The patient agreed with the plan and demonstrated an understanding of the instructions.  Patient advised to call back or seek an in-person evaluation if the symptoms or condition worsens.  Total duration of non-face-to-face encounter: 12 minutes.  Note by: Dionisio David, NP Date: 09/13/2018; Time: 11:01 AM  Disclaimer:  * Given the special circumstances of the COVID-19 pandemic, the federal government has announced that the Office for Civil Rights (OCR) will exercise its enforcement discretion and will not impose penalties on physicians using telehealth in the event of  noncompliance with regulatory requirements under the [place] and [place] (HIPAA) in connection with the good faith provision of telehealth during the ZSMOL-07 national public health emergency. ([place])

## 2018-09-23 ENCOUNTER — Other Ambulatory Visit: Payer: Self-pay | Admitting: Family Medicine

## 2018-09-23 DIAGNOSIS — E7800 Pure hypercholesterolemia, unspecified: Secondary | ICD-10-CM

## 2018-10-03 ENCOUNTER — Ambulatory Visit: Payer: BLUE CROSS/BLUE SHIELD | Admitting: Student in an Organized Health Care Education/Training Program

## 2018-10-30 ENCOUNTER — Telehealth: Payer: Self-pay | Admitting: Pain Medicine

## 2018-10-30 NOTE — Telephone Encounter (Signed)
Patient called stating he is out of meds and appt was not scheduled until 11-09-18, also was with wrong physician.  I rescheduled him to 11-01-18 but he has no meds since Sunday. Please call patient and let him know if meds can be sent for 1 month and he will keep virtual visit for Wed.

## 2018-10-30 NOTE — Telephone Encounter (Signed)
Called patient and notified him that he has a prescription that can be filled today. Patient with understanding.

## 2018-10-31 ENCOUNTER — Encounter: Payer: Self-pay | Admitting: Pain Medicine

## 2018-11-01 ENCOUNTER — Other Ambulatory Visit: Payer: Self-pay

## 2018-11-01 ENCOUNTER — Ambulatory Visit
Payer: BLUE CROSS/BLUE SHIELD | Attending: Student in an Organized Health Care Education/Training Program | Admitting: Student in an Organized Health Care Education/Training Program

## 2018-11-01 ENCOUNTER — Telehealth: Payer: Self-pay | Admitting: *Deleted

## 2018-11-01 ENCOUNTER — Encounter: Payer: Self-pay | Admitting: Student in an Organized Health Care Education/Training Program

## 2018-11-01 ENCOUNTER — Ambulatory Visit: Payer: BLUE CROSS/BLUE SHIELD | Admitting: Pain Medicine

## 2018-11-01 DIAGNOSIS — M961 Postlaminectomy syndrome, not elsewhere classified: Secondary | ICD-10-CM | POA: Diagnosis not present

## 2018-11-01 DIAGNOSIS — M48061 Spinal stenosis, lumbar region without neurogenic claudication: Secondary | ICD-10-CM

## 2018-11-01 DIAGNOSIS — Z981 Arthrodesis status: Secondary | ICD-10-CM

## 2018-11-01 DIAGNOSIS — M5416 Radiculopathy, lumbar region: Secondary | ICD-10-CM

## 2018-11-01 DIAGNOSIS — M5136 Other intervertebral disc degeneration, lumbar region: Secondary | ICD-10-CM

## 2018-11-01 DIAGNOSIS — G894 Chronic pain syndrome: Secondary | ICD-10-CM | POA: Diagnosis not present

## 2018-11-01 DIAGNOSIS — T85192D Other mechanical complication of implanted electronic neurostimulator (electrode) of spinal cord, subsequent encounter: Secondary | ICD-10-CM

## 2018-11-01 MED ORDER — HYDROCODONE-ACETAMINOPHEN 10-325 MG PO TABS: 1.0000 | ORAL_TABLET | Freq: Three times a day (TID) | ORAL | 0 refills | Status: AC | PRN

## 2018-11-01 MED ORDER — TIZANIDINE HCL 4 MG PO TABS: 4.0000 mg | ORAL_TABLET | Freq: Three times a day (TID) | ORAL | 2 refills | Status: AC | PRN

## 2018-11-01 NOTE — Progress Notes (Signed)
Pain Management Virtual Encounter Note - Virtual Visit via Telephone Telehealth (real-time audio visits between healthcare provider and patient).   Patient's Phone No. & Preferred Pharmacy:  [phone] (home); [phone] (mobile); (Preferred) [phone] [email or web address]  [id number] - [place], [place] - [address] [place] [address] Phone: [phone] Fax: [phone]    Pre-screening note:  Our staff contacted Wesley Adams and offered him an "in person", "face-to-face" appointment versus a telephone encounter. He indicated preferring the telephone encounter, at this time.   Reason for Virtual Visit: COVID-19*  Social distancing based on CDC and AMA recommendations.   I contacted Wesley Adams on 11/01/2018 via telephone.      I clearly identified myself as Wesley Santa, MD. I verified that I was speaking with the correct person using two identifiers (Name: Wesley Adams, and date of birth: May 05, 1965).  Advanced Informed Consent I sought verbal advanced consent from Wesley Adams for virtual visit interactions. I informed Wesley Adams of possible security and privacy concerns, risks, and limitations associated with providing "not-in-person" medical evaluation and management services. I also informed Wesley Adams of the availability of "in-person" appointments. Finally, I informed him that there would be a charge for the virtual visit and that he could be  personally, fully or partially, financially responsible for it. Wesley Adams expressed understanding and agreed to proceed.   Historic Elements   Wesley Adams is a 53 y.o. year old, male patient evaluated today after his last encounter by our practice on 11/01/2018. 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 He also  has a past surgical history that includes Back surgery (Feb 2016); Spinal cord  stimulator insertion (03/23/2017); Colonoscopy with propofol (N/A, 06/01/2017); Esophagogastroduodenoscopy (egd) with propofol (06/01/2017); polypectomy (06/01/2017); Spinal cord stimulator removal (N/A, 06/16/2017); Fracture surgery (Right, as child); and LEFT HEART CATH AND CORONARY ANGIOGRAPHY (N/A, 12/08/2017). Wesley Adams has a current medication list which includes the following prescription(s): acetaminophen, aspirin, atorvastatin, fluticasone, hydrocodone-acetaminophen, hydrocodone-acetaminophen, gabapentin, and tizanidine. He  reports that he has been smoking cigarettes. He started smoking about 35 years ago. He has a 35.00 pack-year smoking history. He has never used smokeless tobacco. He reports current alcohol use. He reports that he does not use drugs. Wesley Adams has No Known Allergies.   HPI  Today, he is being contacted for medication management.   Was previously seeing Wesley Adams for medication management.  States that he is having increased low back and left thigh pain that radiates to his left leg.  We have effectively wean his hydrocodone from 120 tablets/month to 60 tablets/month but patient states that he is requiring an extra tablet on some nights due to increased pain.  We discussed increasing his monthly quantity to 75.  Patient is okay with this.  Patient will follow-up in 2 months.  Pharmacotherapy Assessment  Analgesic: 10/30/2018  2   09/13/2018  Hydrocodone-Acetamin 10-325 MG  60.00 30 Cr Kin   [id number]   Nor (0921)   0  20.00 MME  Comm Ins   [place]    Monitoring: Pharmacotherapy: No side-effects or adverse reactions reported. [place] PMP: PDMP reviewed during this encounter.       Compliance: No problems identified. Effectiveness: Clinically acceptable. Plan: Refer to "POC".  Pertinent Labs   SAFETY SCREENING Profile Lab Results  Component Value Date   STAPHAUREUS NEGATIVE 06/13/2017   MRSAPCR NEGATIVE 06/13/2017  HIV Non Reactive 12/06/2017   Renal Function Lab Results   Component Value Date   BUN 12 01/02/2018   CREATININE 0.81 01/02/2018   BCR 15 01/02/2018   GFRAA 118 01/02/2018   GFRNONAA 102 01/02/2018   Hepatic Function Lab Results  Component Value Date   AST 24 12/06/2017   ALT 21 12/06/2017   ALBUMIN 4.4 12/06/2017   UDS Summary  Date Value Ref Range Status  05/03/2018 FINAL  Final    Comment:    ==================================================================== TOXASSURE SELECT 13 (MW) ==================================================================== Test                             Result       Flag       Units   NO DRUGS DETECTED. ==================================================================== Test                      Result    Flag   Units      Ref Range   Creatinine              256              mg/dL      >=20 ==================================================================== Declared Medications:  The flagging and interpretation on this report are based on the  following declared medications.  Unexpected results may arise from  inaccuracies in the declared medications.  **Note: The testing scope of this panel does not include following  reported medications:  Acetaminophen (Tylenol)  Aspirin  Atorvastatin (Lipitor)  Gabapentin (Neurontin) ==================================================================== For clinical consultation, please call [phone]. ====================================================================    Note: Above Lab results reviewed.  Recent imaging  CT HEMATURIA WORKUP CLINICAL DATA:  Episodes of gross hematuria.  EXAM: CT ABDOMEN AND PELVIS WITHOUT AND WITH CONTRAST  TECHNIQUE: Multidetector CT imaging of the abdomen and pelvis was performed following the standard protocol before and following the bolus administration of intravenous contrast.  CONTRAST:  116mL ISOVUE-300 IOPAMIDOL (ISOVUE-300) INJECTION 61%  COMPARISON:  CT scan 04/29/2017  FINDINGS: Lower  chest: The lung bases are clear of acute process. No pleural effusion or pulmonary lesions. The heart is normal in size. No pericardial effusion. Coronary artery calcifications are noted. The distal esophagus and aorta are unremarkable.  Hepatobiliary: No focal hepatic lesions or intrahepatic biliary dilatation. The gallbladder is normal. No common bile duct dilatation.  Pancreas: No mass, inflammation or ductal dilatation.  Spleen: Normal size.  No focal lesions.  Adrenals/Urinary Tract: Adrenal glands are unremarkable.  No renal, ureteral or bladder calculi. Both kidneys demonstrate normal enhancement/perfusion. No worrisome renal lesions. There is a stable 2 cm cyst in the midpole region right kidney. The delayed images do not demonstrate any significant collecting system abnormalities. Both ureters are normal. The bladder is normal.  Stomach/Bowel: The stomach, duodenum, small bowel and colon are grossly normal without oral contrast. No inflammatory changes, mass lesions or obstructive findings. The terminal ileum and appendix are normal.  Vascular/Lymphatic: Advanced atherosclerotic calcifications involving the aorta and iliac arteries but no focal aneurysm or dissection. The branch vessels are patent. The major venous structures are patent. No mesenteric or retroperitoneal mass or adenopathy.  Reproductive: The prostate gland and seminal vesicles are unremarkable.  Other: No pelvic mass or adenopathy. No free pelvic fluid collections. No inguinal mass or adenopathy. No abdominal wall hernia or subcutaneous lesions.  Musculoskeletal: A right femoral rod is noted. Both hips are normally located. Evidence of prior  left iliac bone trauma with areas of heterotopic ossification.  Stable lumbar fusion hardware.  IMPRESSION: 1. No CT findings to account for the patient's hematuria. No renal, ureteral or bladder calculi or mass. Stable right renal cyst. 2. No acute  abdominal/pelvic findings, mass lesions or adenopathy. 3. Stable age advanced atherosclerotic calcifications involving the abdominal aorta and iliac arteries. Coronary artery calcifications are also noted  Electronically Signed   By: Marijo Sanes M.D.   On: 01/23/2018 15:57  Assessment  The primary encounter diagnosis was Chronic pain syndrome. Diagnoses of History of lumbar fusion (L4/5), Failed back surgical syndrome, Spinal stenosis of lumbar region with radiculopathy, Failure of spinal cord stimulator, subsequent encounter, Lumbar degenerative disc disease, and Lumbar radiculopathy (L>R) were also pertinent to this visit.  Plan of Care  I have changed Idalia Needle. Blackson's HYDROcodone-acetaminophen. I am also having him start on tiZANidine and HYDROcodone-acetaminophen. Additionally, I am having him maintain his acetaminophen, aspirin, gabapentin, fluticasone, and atorvastatin.  Pharmacotherapy (Medications Ordered): Meds ordered this encounter  Medications  . tiZANidine (ZANAFLEX) 4 MG tablet    Sig: Take 1 tablet (4 mg total) by mouth every 8 (eight) hours as needed for muscle spasms.    Dispense:  90 tablet    Refill:  2    Do not place this medication, or any other prescription from our practice, on "Automatic Refill". Patient may have prescription filled one day early if pharmacy is closed on scheduled refill date.  Marland Kitchen HYDROcodone-acetaminophen (NORCO) 10-325 MG tablet    Sig: Take 1 tablet by mouth every 8 (eight) hours as needed.    Dispense:  75 tablet    Refill:  0    Do not place this medication, or any other prescription from our practice, on "Automatic Refill". Patient may have prescription filled one day early if pharmacy is closed on scheduled refill date.  Marland Kitchen HYDROcodone-acetaminophen (NORCO) 10-325 MG tablet    Sig: Take 1 tablet by mouth every 8 (eight) hours as needed.    Dispense:  75 tablet    Refill:  0    Do not place this medication, or any other prescription  from our practice, on "Automatic Refill". Patient may have prescription filled one day early if pharmacy is closed on scheduled refill date.   Orders:  Orders Placed This Encounter  Procedures  . ToxASSURE Select 13 (MW), Urine    Volume: 30 ml(s). Minimum 3 ml of urine is needed. Document temperature of fresh sample. Indications: Long term (current) use of opiate analgesic (B15.176)   Follow-up plan:   Return in about 11 weeks (around 01/17/2019) for Medication Management.        Recent Visits Date Type Provider Dept  09/13/18 Office Visit Vevelyn Francois, NP [facility]  08/17/18 Office Visit Vevelyn Francois, NP [place] recent visits within past 90 days and meeting all other requirements   Today's Visits Date Type Provider Dept  11/01/18 Office Visit Wesley Santa, MD [facility]  Showing today's visits and meeting all other requirements   Future Appointments No visits were found meeting these conditions.  Showing future appointments within next 90 days and meeting all other requirements   I discussed the assessment and treatment plan with the patient. The patient was provided an opportunity to ask questions and all were answered. The patient agreed with the plan and demonstrated an understanding of the instructions.  Patient advised to call back or seek an in-person evaluation if  the symptoms or condition worsens.  Total duration of non-face-to-face encounter: 72minutes.  Note by: Wesley Santa, MD Date: 11/01/2018; Time: 2:48 PM  Note: This dictation was prepared with Dragon dictation. Any transcriptional errors that may result from this process are unintentional.  Disclaimer:  * Given the special circumstances of the COVID-19 pandemic, the federal government has announced that the Office for Civil Rights (OCR) will exercise its enforcement discretion and will not impose penalties on physicians using telehealth in the event of  noncompliance with regulatory requirements under the [place] and [place] (HIPAA) in connection with the good faith provision of telehealth during the DIXBO-47 national public health emergency. ([place])

## 2018-11-06 NOTE — Progress Notes (Signed)
Visit placed on wrong physician's schedule.

## 2018-11-09 ENCOUNTER — Encounter: Payer: BLUE CROSS/BLUE SHIELD | Admitting: Student in an Organized Health Care Education/Training Program

## 2019-01-15 ENCOUNTER — Encounter: Payer: Self-pay | Admitting: Student in an Organized Health Care Education/Training Program

## 2019-01-16 ENCOUNTER — Encounter: Payer: Self-pay | Admitting: Student in an Organized Health Care Education/Training Program

## 2019-01-16 ENCOUNTER — Ambulatory Visit
Payer: Medicaid Other | Attending: Student in an Organized Health Care Education/Training Program | Admitting: Student in an Organized Health Care Education/Training Program

## 2019-01-16 ENCOUNTER — Other Ambulatory Visit: Payer: Self-pay

## 2019-01-16 DIAGNOSIS — G894 Chronic pain syndrome: Secondary | ICD-10-CM

## 2019-01-16 DIAGNOSIS — Z981 Arthrodesis status: Secondary | ICD-10-CM

## 2019-01-16 DIAGNOSIS — M961 Postlaminectomy syndrome, not elsewhere classified: Secondary | ICD-10-CM

## 2019-01-16 NOTE — Progress Notes (Signed)
Today's visit was supposed to be a medication refill visit however patient has not completed his urine toxicology screen that was ordered at his previous visit in July.  I informed the patient that he needs to complete a urine toxicology screen before I can re-prescribe.  Patient endorsed understanding.  Patient states that he was exposed to COVID-19 and is quarantining himself right now.  He has 7 days left at this quarantine.  He states that he will come into clinic afterwards to submit a urine sample.  I instructed the patient to make a follow-up appointment 7 days after he comes in to submit his urine sample for medication management.  Can also discussed repeating lumbar MRI at that time.  Patient endorsed understanding.

## 2019-02-16 ENCOUNTER — Other Ambulatory Visit: Payer: Self-pay | Admitting: Student in an Organized Health Care Education/Training Program

## 2019-03-16 ENCOUNTER — Ambulatory Visit
Admission: RE | Admit: 2019-03-16 | Discharge: 2019-03-16 | Disposition: A | Discharge status: Home / Self Care | Payer: Medicaid Other | Source: Ambulatory Visit | Attending: Family Medicine | Admitting: Family Medicine

## 2019-03-16 ENCOUNTER — Other Ambulatory Visit: Payer: Self-pay

## 2019-03-16 ENCOUNTER — Telehealth: Payer: Self-pay | Admitting: *Deleted

## 2019-03-16 ENCOUNTER — Encounter: Payer: Self-pay | Admitting: Family Medicine

## 2019-03-16 ENCOUNTER — Ambulatory Visit: Payer: BLUE CROSS/BLUE SHIELD | Admitting: Family Medicine

## 2019-03-16 ENCOUNTER — Ambulatory Visit
Admission: RE | Admit: 2019-03-16 | Discharge: 2019-03-16 | Disposition: A | Discharge status: Home / Self Care | Payer: Medicaid Other | Attending: Family Medicine | Admitting: Family Medicine

## 2019-03-16 VITALS — BP 118/74 | HR 88 | Temp 97.5°F | Resp 16 | Ht 73.0 in | Wt 206.5 lb

## 2019-03-16 DIAGNOSIS — M619 Calcification and ossification of muscle, unspecified: Secondary | ICD-10-CM | POA: Insufficient documentation

## 2019-03-16 DIAGNOSIS — S7001XA Contusion of right hip, initial encounter: Secondary | ICD-10-CM | POA: Diagnosis not present

## 2019-03-16 DIAGNOSIS — M5441 Lumbago with sciatica, right side: Secondary | ICD-10-CM | POA: Diagnosis not present

## 2019-03-16 DIAGNOSIS — I700 Atherosclerosis of aorta: Secondary | ICD-10-CM | POA: Insufficient documentation

## 2019-03-16 DIAGNOSIS — M533 Sacrococcygeal disorders, not elsewhere classified: Secondary | ICD-10-CM

## 2019-03-16 DIAGNOSIS — S3992XA Unspecified injury of lower back, initial encounter: Secondary | ICD-10-CM | POA: Diagnosis not present

## 2019-03-16 DIAGNOSIS — M25551 Pain in right hip: Secondary | ICD-10-CM | POA: Diagnosis not present

## 2019-03-16 DIAGNOSIS — M545 Low back pain: Secondary | ICD-10-CM | POA: Diagnosis not present

## 2019-03-16 DIAGNOSIS — S79911A Unspecified injury of right hip, initial encounter: Secondary | ICD-10-CM | POA: Diagnosis not present

## 2019-03-16 MED ORDER — MELOXICAM 15 MG PO TABS: 15.0000 mg | ORAL_TABLET | Freq: Every day | ORAL | 0 refills | Status: DC

## 2019-03-16 MED ORDER — TIZANIDINE HCL 4 MG PO TABS: 4.0000 mg | ORAL_TABLET | Freq: Four times a day (QID) | ORAL | 0 refills | Status: DC | PRN

## 2019-03-16 NOTE — Telephone Encounter (Signed)
Spoke with patient.  States that he fell and thinks he broke his tailbone.  States his whole buttock is black and blue.  Encouraged patient to go to PCP or ED for xrays.  Would like Dr Holley Raring to know about this and to review Xrays because he is worried about his back.  Will forward this message to Dr Holley Raring.
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
## 2019-04-02 ENCOUNTER — Other Ambulatory Visit: Payer: Self-pay

## 2019-04-02 ENCOUNTER — Encounter: Payer: Self-pay | Admitting: Family Medicine

## 2019-04-02 ENCOUNTER — Ambulatory Visit (INDEPENDENT_AMBULATORY_CARE_PROVIDER_SITE_OTHER): Payer: Medicaid Other | Admitting: Family Medicine

## 2019-04-02 DIAGNOSIS — J0140 Acute pansinusitis, unspecified: Secondary | ICD-10-CM | POA: Diagnosis not present

## 2019-04-02 DIAGNOSIS — J989 Respiratory disorder, unspecified: Secondary | ICD-10-CM

## 2019-04-02 MED ORDER — AZELASTINE HCL 0.1 % NA SOLN: 1.0000 | Freq: Two times a day (BID) | NASAL | 2 refills | Status: DC

## 2019-04-02 MED ORDER — AMOXICILLIN-POT CLAVULANATE 875-125 MG PO TABS: 1.0000 | ORAL_TABLET | Freq: Two times a day (BID) | ORAL | 0 refills | Status: DC

## 2019-04-02 NOTE — Progress Notes (Signed)
Name: Wesley Adams   MRN: [id number]    DOB: 03-12-1966   Date:04/02/2019       Progress Note  Subjective  Chief Complaint  Chief Complaint  Patient presents with  . Sinusitis    yellow phelgm, cough 2 days    I connected with  Peri Jefferson on 04/02/19 at  2:20 PM EST by telephone and verified that I am speaking with the correct person using two identifiers.   I discussed the limitations, risks, security and privacy concerns of performing an evaluation and management service by telephone and the availability of in person appointments. Staff also discussed with the patient that there may be a patient responsible charge related to this service. Patient Location: Home Provider Location: Office Additional Individuals present: None  HPI  Pt presents with concern for severe sinus congestion, headache x2 days; had rhinorrhea for about 8 days now.  Denies cough, shortness of breath, chest pain/tightness, fevers/chills, body aches, sore throat.  Tried mucinex - headache and pressure improved significantly during the day, but did not help at night.  Also taking flonase without relief.  No known COVID-19 exposure in the last 2 weeks.  Patient Active Problem List   Diagnosis Date Noted  . Lumbar spondylosis 05/03/2018  . Chronic pain of right lower extremity 05/03/2018  . Coronary artery disease involving native heart 12/12/2017  . Chest pain 12/06/2017  . Iliac artery stenosis, left ([place]) 12/06/2017  . Atherosclerosis of aorta ([place]) 12/06/2017  . Long term current use of opiate analgesic 11/03/2017  . Chronic bilateral low back pain with bilateral sciatica 10/03/2017  . Chronic pain syndrome 10/03/2017  . Opiate use 10/03/2017  . Epigastric pain   . Special screening for malignant neoplasms, colon   . Chronic, continuous use of opioids 05/27/2017  . Tobacco use 05/27/2017  . Lumbar degenerative disc disease 01/30/2015    Social History   Tobacco Use  . Smoking status:  Current Every Day Smoker    Packs/day: 1.00    Years: 35.00    Pack years: 35.00    Types: Cigarettes    Start date: 08/02/1983  . Smokeless tobacco: Never Used  Substance Use Topics  . Alcohol use: Yes    Alcohol/week: 0.0 standard drinks    Comment: occasionally drinks beer     Current Outpatient Medications:  .  acetaminophen (TYLENOL) 325 MG tablet, Take 2 tablets (650 mg total) by mouth every 6 (six) hours as needed for mild pain (or Fever >/= 101)., Disp: , Rfl:  .  aspirin EC 81 MG EC tablet, Take 1 tablet (81 mg total) by mouth daily., Disp: , Rfl:  .  atorvastatin (LIPITOR) 40 MG tablet, TAKE 1 TABLET BY MOUTH EVERY DAY, Disp: 90 tablet, Rfl: 1 .  fluticasone (FLONASE) 50 MCG/ACT nasal spray, SPRAY 2 SPRAYS INTO EACH NOSTRIL EVERY DAY, Disp: 16 g, Rfl: 6 .  meloxicam (MOBIC) 15 MG tablet, Take 1 tablet (15 mg total) by mouth daily., Disp: 30 tablet, Rfl: 0 .  tiZANidine (ZANAFLEX) 4 MG tablet, Take 1 tablet (4 mg total) by mouth every 6 (six) hours as needed for muscle spasms., Disp: 30 tablet, Rfl: 0 .  gabapentin (NEURONTIN) 300 MG capsule, Take 1 capsule (300 mg total) by mouth 3 (three) times daily for 30 days. At bedtime, Disp: 90 capsule, Rfl: 0  No Known Allergies  I personally reviewed active problem list, medication list, allergies, notes from last encounter, lab results with the patient/caregiver today.  ROS  Ten systems reviewed and is negative except as mentioned in HPI  Objective  Virtual encounter, vitals not obtained.  There is no height or weight on file to calculate BMI.  Nursing Note and Vital Signs reviewed.  Physical Exam  Constitutional: Patient appears well-developed and well-nourished. No distress.  HENT: Head: Normocephalic and atraumatic.  Neck: Normal range of motion. Pulmonary/Chest: Effort normal. No respiratory distress. Speaking in complete sentences Neurological: Pt is alert and oriented to person, place, and time. Coordination,  speech and gait are normal.  Psychiatric: Patient has a normal mood and affect. behavior is normal. Judgment and thought content normal.  No results found for this or any previous visit (from the past 72 hour(s)).  Assessment & Plan  1. Acute non-recurrent pansinusitis - azelastine (ASTELIN) 0.1 % nasal spray; Place 1 spray into both nostrils 2 (two) times daily. Use in each nostril as directed  Dispense: 30 mL; Refill: 2 - amoxicillin-clavulanate (AUGMENTIN) 875-125 MG tablet; Take 1 tablet by mouth 2 (two) times daily for 10 days.  Dispense: 20 tablet; Refill: 0 - Novel Coronavirus, NAA ([facility])  2. Respiratory illness - Community spread is quite high right now; we will test to ensure no concomitant COVID-19. - Novel Coronavirus, NAA ([facility])  -Red flags and when to present for emergency care or RTC including fever >101.53F, chest pain, shortness of breath, new/worsening/un-resolving symptoms, reviewed with patient at time of visit. Follow up and care instructions discussed and provided in AVS. - I discussed the assessment and treatment plan with the patient. The patient was provided an opportunity to ask questions and all were answered. The patient agreed with the plan and demonstrated an understanding of the instructions.  - The patient was advised to call back or seek an in-person evaluation if the symptoms worsen or if the condition fails to improve as anticipated.  I provided 10 minutes of non-face-to-face time during this encounter.  Hubbard Hartshorn, FNP

## 2019-04-03 ENCOUNTER — Other Ambulatory Visit: Payer: Self-pay

## 2019-04-03 DIAGNOSIS — Z20828 Contact with and (suspected) exposure to other viral communicable diseases: Secondary | ICD-10-CM | POA: Diagnosis not present

## 2019-04-03 DIAGNOSIS — Z20822 Contact with and (suspected) exposure to covid-19: Secondary | ICD-10-CM

## 2019-04-05 LAB — NOVEL CORONAVIRUS, NAA: SARS-CoV-2, NAA: NOT DETECTED

## 2019-04-06 ENCOUNTER — Encounter: Payer: Self-pay | Admitting: Emergency Medicine

## 2019-04-06 ENCOUNTER — Ambulatory Visit: Payer: Medicaid Other

## 2019-04-06 ENCOUNTER — Ambulatory Visit
Admission: EM | Admit: 2019-04-06 | Discharge: 2019-04-06 | Disposition: A | Discharge status: Home / Self Care | Payer: Medicaid Other | Attending: Internal Medicine | Admitting: Internal Medicine

## 2019-04-06 ENCOUNTER — Other Ambulatory Visit: Payer: Self-pay

## 2019-04-06 DIAGNOSIS — G43009 Migraine without aura, not intractable, without status migrainosus: Secondary | ICD-10-CM | POA: Insufficient documentation

## 2019-04-06 DIAGNOSIS — J320 Chronic maxillary sinusitis: Secondary | ICD-10-CM | POA: Diagnosis not present

## 2019-04-06 DIAGNOSIS — R519 Headache, unspecified: Secondary | ICD-10-CM | POA: Diagnosis not present

## 2019-04-06 MED ORDER — GUAIFENESIN ER 600 MG PO TB12: 600.0000 mg | ORAL_TABLET | Freq: Two times a day (BID) | ORAL | 0 refills | Status: DC | PRN

## 2019-04-06 MED ORDER — KETOROLAC TROMETHAMINE 60 MG/2ML IM SOLN
60.0000 mg | Freq: Once | INTRAMUSCULAR | Status: AC
  Administered 2019-04-06: 60 mg via INTRAMUSCULAR

## 2019-04-06 MED ORDER — SUMATRIPTAN SUCCINATE 6 MG/0.5ML ~~LOC~~ SOLN
6.0000 mg | Freq: Once | SUBCUTANEOUS | Status: AC
  Administered 2019-04-06: 6 mg via SUBCUTANEOUS

## 2019-04-06 MED ORDER — IBUPROFEN 800 MG PO TABS: 800.0000 mg | ORAL_TABLET | Freq: Three times a day (TID) | ORAL | 0 refills | Status: DC | PRN

## 2019-04-06 MED ORDER — DEXAMETHASONE SODIUM PHOSPHATE 10 MG/ML IJ SOLN
10.0000 mg | Freq: Once | INTRAMUSCULAR | Status: AC
  Administered 2019-04-06: 10 mg via INTRAMUSCULAR

## 2019-04-06 MED ORDER — METOCLOPRAMIDE HCL 5 MG/ML IJ SOLN
10.0000 mg | Freq: Once | INTRAMUSCULAR | Status: AC
  Administered 2019-04-06: 10 mg via INTRAMUSCULAR

## 2019-04-06 NOTE — ED Triage Notes (Signed)
Patient c/o sinus pressure and HA that started on Monday.  Patient states that he was seen at his PCP on Monday and was given Amoxicillin for sinus infection.  Patient c/o pain behind his right eye since Monday but has gotten worse.  Patient denies fevers.  Patient also was tested for COVID 2 days ago and was Negative.

## 2019-04-06 NOTE — ED Provider Notes (Signed)
[facility]    CSN: [id number] Arrival date & time: 04/06/19  [id number]      History   Chief Complaint Chief Complaint  Patient presents with  . Sinus Problem  . Headache    HPI Wesley Adams is a 53 y.o. male history of chronic pain, comes to urgent care with progressive severe headache over the past few days.  Patient started having some sinus pressure and nasal congestion on Monday.  He was seen by the PCP, tested negative for Covid and was started on amoxicillin for sinus infection.  Patient complains of severe 10 out of 10 headache at this time.  Headache is unilateral and retro-orbital.  He denies any aggravating factors.  No relieving factors known.  He denies any numbness or tingling.  No difficulty with his speech.  No facial changes.  Patient denies any fever or chills.  No nausea or vomiting.  He has no history of migraine.  No floaters in his visual field.  HPI  Past Medical History:  Diagnosis Date  . Chronic back pain   . Flu    9/19  . Spinal cord stimulator status 04/29/2017   [facility] Scientific Spinal Cord Stimulator placed by Dr. Newman Pies 03/23/2017.    Patient Active Problem List   Diagnosis Date Noted  . Lumbar spondylosis 05/03/2018  . Chronic pain of right lower extremity 05/03/2018  . Coronary artery disease involving native heart 12/12/2017  . Chest pain 12/06/2017  . Iliac artery stenosis, left ([place]) 12/06/2017  . Atherosclerosis of aorta ([place]) 12/06/2017  . Long term current use of opiate analgesic 11/03/2017  . Chronic bilateral low back pain with bilateral sciatica 10/03/2017  . Chronic pain syndrome 10/03/2017  . Opiate use 10/03/2017  . Epigastric pain   . Special screening for malignant neoplasms, colon   . Chronic, continuous use of opioids 05/27/2017  . Tobacco use 05/27/2017  . Lumbar degenerative disc disease 01/30/2015    Past Surgical History:  Procedure Laterality Date  . BACK SURGERY  Feb 2016   cut disc off  of nerve with Dr Arnoldo Morale  . COLONOSCOPY WITH PROPOFOL N/A 06/01/2017   Procedure: COLONOSCOPY WITH PROPOFOL;  Surgeon: Lin Landsman, MD;  Location: [place];  Service: Endoscopy;  Laterality: N/A;  . ESOPHAGOGASTRODUODENOSCOPY (EGD) WITH PROPOFOL  06/01/2017   Procedure: ESOPHAGOGASTRODUODENOSCOPY (EGD) WITH PROPOFOL;  Surgeon: Lin Landsman, MD;  Location: [place];  Service: Endoscopy;;  . FRACTURE SURGERY Right as child   rod in right femur  . LEFT HEART CATH AND CORONARY ANGIOGRAPHY N/A 12/08/2017   Procedure: LEFT HEART CATH AND CORONARY ANGIOGRAPHY;  Surgeon: Corey Skains, MD;  Location: [place] [facility];  Service: Cardiovascular;  Laterality: N/A;  . POLYPECTOMY  06/01/2017   Procedure: POLYPECTOMY;  Surgeon: Lin Landsman, MD;  Location: [place];  Service: Endoscopy;;  . SPINAL CORD STIMULATOR INSERTION  03/23/2017   Dr Newman Pies, [facility] Neurosurgery  . SPINAL CORD STIMULATOR REMOVAL N/A 06/16/2017   Procedure: SPINAL CORD STIMULATOR REMOVAL;  Surgeon: Newman Pies, MD;  Location: [place];  Service: Neurosurgery;  Laterality: N/A;       Home Medications    Prior to Admission medications   Medication Sig Start Date End Date Taking? Authorizing Provider  amoxicillin-clavulanate (AUGMENTIN) 875-125 MG tablet Take 1 tablet by mouth 2 (two) times daily for 10 days. 04/02/19 04/12/19 Yes Hubbard Hartshorn, FNP  aspirin EC 81 MG EC tablet Take 1 tablet (  81 mg total) by mouth daily. 12/08/17  Yes Dustin Flock, MD  atorvastatin (LIPITOR) 40 MG tablet TAKE 1 TABLET BY MOUTH EVERY DAY 09/23/18  Yes Hubbard Hartshorn, FNP  azelastine (ASTELIN) 0.1 % nasal spray Place 1 spray into both nostrils 2 (two) times daily. Use in each nostril as directed 04/02/19  Yes Hubbard Hartshorn, FNP  fluticasone [facility]) 50 MCG/ACT nasal spray SPRAY 2 SPRAYS INTO EACH NOSTRIL EVERY DAY 08/23/18  Yes Hubbard Hartshorn, FNP  meloxicam (MOBIC) 15 MG tablet Take  1 tablet (15 mg total) by mouth daily. 03/16/19  Yes Hubbard Hartshorn, FNP  tiZANidine (ZANAFLEX) 4 MG tablet Take 1 tablet (4 mg total) by mouth every 6 (six) hours as needed for muscle spasms. 03/16/19  Yes Hubbard Hartshorn, FNP  acetaminophen (TYLENOL) 325 MG tablet Take 2 tablets (650 mg total) by mouth every 6 (six) hours as needed for mild pain (or Fever >/= 101). 12/08/17   Dustin Flock, MD  gabapentin (NEURONTIN) 300 MG capsule Take 1 capsule (300 mg total) by mouth 3 (three) times daily for 30 days. At bedtime 08/31/18 10/31/18  Vevelyn Francois, NP    Family History Family History  Problem Relation Age of Onset  . Alcohol abuse Mother   . Heart attack Father   . Heart disease Father   . Obesity Father   . Heart attack Paternal Grandfather     Social History Social History   Tobacco Use  . Smoking status: Current Every Day Smoker    Packs/day: 1.00    Years: 35.00    Pack years: 35.00    Types: Cigarettes    Start date: 08/02/1983  . Smokeless tobacco: Never Used  Substance Use Topics  . Alcohol use: Yes    Alcohol/week: 0.0 standard drinks    Comment: occasionally drinks beer  . Drug use: No     Allergies   Patient has no known allergies.   Review of Systems Review of Systems  Constitutional: Positive for activity change. Negative for chills, fatigue and fever.  HENT: Positive for congestion, rhinorrhea, sinus pressure and sinus pain. Negative for ear discharge, ear pain, sore throat and voice change.   Respiratory: Negative for cough, chest tightness and shortness of breath.   Cardiovascular: Negative for chest pain and palpitations.  Gastrointestinal: Negative for diarrhea, nausea and vomiting.  Genitourinary: Negative.   Skin: Negative.  Negative for rash.  Neurological: Positive for headaches. Negative for dizziness, weakness, light-headedness and numbness.  Psychiatric/Behavioral: Negative for confusion and decreased concentration.     Physical Exam Triage  Vital Signs ED Triage Vitals  Enc Vitals Group     BP 04/06/19 0847 139/86     Pulse Rate 04/06/19 0847 66     Resp 04/06/19 0847 16     Temp 04/06/19 0847 97.7 F (36.5 C)     Temp Source 04/06/19 0847 Oral     SpO2 04/06/19 0847 100 %     Weight 04/06/19 0845 215 lb (97.5 kg)     Height 04/06/19 0845 6\' 1"  (1.854 m)     Head Circumference --      Peak Flow --      Pain Score 04/06/19 0844 10     Pain Loc --      Pain Edu? --      Excl. in Max? --    No data found.  Updated Vital Signs BP 139/86 (BP Location: Left Arm)   Pulse 66  Temp 97.7 F (36.5 C) (Oral)   Resp 16   Ht 6\' 1"  (1.854 m)   Wt 97.5 kg   SpO2 100%   BMI 28.37 kg/m   Visual Acuity Right Eye Distance:   Left Eye Distance:   Bilateral Distance:    Right Eye Near:   Left Eye Near:    Bilateral Near:     Physical Exam Vitals and nursing note reviewed.  Constitutional:      General: He is in acute distress.     Appearance: He is well-developed. He is ill-appearing. He is not toxic-appearing.  Eyes:     General: No visual field deficit or scleral icterus.    Extraocular Movements: Extraocular movements intact.     Pupils: Pupils are equal, round, and reactive to light. Pupils are equal.  Cardiovascular:     Rate and Rhythm: Normal rate and regular rhythm.  Pulmonary:     Effort: Pulmonary effort is normal. No respiratory distress.     Breath sounds: No stridor.  Abdominal:     General: There is no distension.     Palpations: Abdomen is soft.     Tenderness: There is no abdominal tenderness. There is no guarding.  Musculoskeletal:        General: Normal range of motion.     Cervical back: Normal range of motion. No rigidity.  Lymphadenopathy:     Cervical: No cervical adenopathy.  Skin:    General: Skin is warm.     Capillary Refill: Capillary refill takes less than 2 seconds.     Coloration: Skin is not cyanotic.     Findings: No rash.  Neurological:     Mental Status: He is alert.      GCS: GCS eye subscore is 4. GCS verbal subscore is 5. GCS motor subscore is 6.     Cranial Nerves: No cranial nerve deficit, dysarthria or facial asymmetry.     Sensory: No sensory deficit.     Motor: No weakness.     Deep Tendon Reflexes: Reflexes normal.  Psychiatric:        Behavior: Behavior is agitated.      UC Treatments / Results  Labs (all labs ordered are listed, but only abnormal results are displayed) Labs Reviewed - No data to display  EKG   Radiology No results found.  Procedures Procedures (including critical care time)  Medications Ordered in UC Medications  ketorolac (TORADOL) injection 60 mg (60 mg Intramuscular Given 04/06/19 0909)    Initial Impression / Assessment and Plan / UC Course  I have reviewed the triage vital signs and the nursing notes.  Pertinent labs & imaging results that were available during my care of the patient were reviewed by me and considered in my medical decision making (see chart for details).    1.  Severe headache with migraine features: Toradol 60 mg IM x1 dose Decadron 10 mg IM x1 dose Imitrex 6 mg subcu x1 dose Reglan 10 mg IM x1 dose After half an hour observing the patient he says the headache is still persistent and describes it as the worst headache he has ever had.  He has no abnormal neurologic findings.  I will proceed with ordering CT scan of the head without contrast.  CT scan of the brain is negative for acute intracranial process.  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   Chase Picket, MD 04/06/19 1051

## 2019-04-09 ENCOUNTER — Telehealth: Payer: Self-pay | Admitting: Emergency Medicine

## 2019-04-09 NOTE — Telephone Encounter (Signed)
Was seen at Urgent Care and told he had a migraine headache. Still not feeling much better. This is a new onset for him. PL today is a 6. What will next step be for a work up

## 2019-04-09 NOTE — Telephone Encounter (Signed)
Will need UC follow up with me or Dr. Ancil Boozer to determine next steps.

## 2019-04-10 NOTE — Telephone Encounter (Signed)
Appointment on 12/17

## 2019-04-12 ENCOUNTER — Ambulatory Visit (INDEPENDENT_AMBULATORY_CARE_PROVIDER_SITE_OTHER): Payer: Medicaid Other | Admitting: Family Medicine

## 2019-04-12 ENCOUNTER — Other Ambulatory Visit: Payer: Self-pay

## 2019-04-12 ENCOUNTER — Encounter: Payer: Self-pay | Admitting: Family Medicine

## 2019-04-12 VITALS — Ht 72.0 in | Wt 210.0 lb

## 2019-04-12 DIAGNOSIS — R519 Headache, unspecified: Secondary | ICD-10-CM

## 2019-04-12 DIAGNOSIS — J329 Chronic sinusitis, unspecified: Secondary | ICD-10-CM | POA: Diagnosis not present

## 2019-04-12 MED ORDER — PREDNISONE 10 MG PO TABS: ORAL_TABLET | ORAL | 0 refills | Status: DC

## 2019-04-12 NOTE — Progress Notes (Signed)
Name: Wesley Adams   MRN: [id number]    DOB: March 19, 1966   Date:04/12/2019       Progress Note  Subjective  Chief Complaint  Chief Complaint  Patient presents with  . Headache    Ongoing for 2 weeks over eye area constant. Recent sinus infection dx @ urgent care    I connected with  Peri Jefferson  on 04/12/19 at  9:00 AM EST by a video enabled telemedicine application and verified that I am speaking with the correct person using two identifiers.  I discussed the limitations of evaluation and management by telemedicine and the availability of in person appointments. The patient expressed understanding and agreed to proceed. Staff also discussed with the patient that there may be a patient responsible charge related to this service. Patient Location: Home Provider Location: Office Additional Individuals present: None  HPI  Pt presents to follow up on his headache for about 10 days.  Originally diagnosed with sinusitis - completed Augmentin course - symptoms of congestion have since cleared.  He saw UC on 04/06/2019 for headache - had CT head and was given "migraine cocktail" which did improve his pain, and it has not worsened since then.  However he is still having constant mild headache ongoing.   - He notes that there is some pressure around the eyes with occasional blurred vision - improved significantly since UC visit.  Has appointment with eye doctor May 03, 2018. - CT Head did show marked chronic right sided sinusitis with polyp.  We discussed referral to ENT and he would like to pursue this at this time.  Patient Active Problem List   Diagnosis Date Noted  . Lumbar spondylosis 05/03/2018  . Chronic pain of right lower extremity 05/03/2018  . Coronary artery disease involving native heart 12/12/2017  . Chest pain 12/06/2017  . Iliac artery stenosis, left ([place]) 12/06/2017  . Atherosclerosis of aorta ([place]) 12/06/2017  . Long term current use of opiate analgesic  11/03/2017  . Chronic bilateral low back pain with bilateral sciatica 10/03/2017  . Chronic pain syndrome 10/03/2017  . Opiate use 10/03/2017  . Epigastric pain   . Special screening for malignant neoplasms, colon   . Chronic, continuous use of opioids 05/27/2017  . Tobacco use 05/27/2017  . Lumbar degenerative disc disease 01/30/2015    Social History   Tobacco Use  . Smoking status: Current Every Day Smoker    Packs/day: 1.00    Years: 35.00    Pack years: 35.00    Types: Cigarettes    Start date: 08/02/1983  . Smokeless tobacco: Never Used  Substance Use Topics  . Alcohol use: Yes    Alcohol/week: 0.0 standard drinks    Comment: occasionally drinks beer     Current Outpatient Medications:  .  amoxicillin-clavulanate (AUGMENTIN) 875-125 MG tablet, Take 1 tablet by mouth 2 (two) times daily for 10 days., Disp: 20 tablet, Rfl: 0 .  aspirin EC 81 MG EC tablet, Take 1 tablet (81 mg total) by mouth daily., Disp: , Rfl:  .  atorvastatin (LIPITOR) 40 MG tablet, TAKE 1 TABLET BY MOUTH EVERY DAY, Disp: 90 tablet, Rfl: 1 .  guaiFENesin (MUCINEX) 600 MG 12 hr tablet, Take 1 tablet (600 mg total) by mouth 2 (two) times daily as needed., Disp: 30 tablet, Rfl: 0 .  ibuprofen (ADVIL) 800 MG tablet, Take 1 tablet (800 mg total) by mouth every 8 (eight) hours as needed for mild pain or moderate pain., Disp:  21 tablet, Rfl: 0 .  acetaminophen (TYLENOL) 325 MG tablet, Take 2 tablets (650 mg total) by mouth every 6 (six) hours as needed for mild pain (or Fever >/= 101). (Patient not taking: Reported on 04/12/2019), Disp: , Rfl:  .  azelastine (ASTELIN) 0.1 % nasal spray, Place 1 spray into both nostrils 2 (two) times daily. Use in each nostril as directed (Patient not taking: Reported on 04/12/2019), Disp: 30 mL, Rfl: 2 .  fluticasone (FLONASE) 50 MCG/ACT nasal spray, SPRAY 2 SPRAYS INTO EACH NOSTRIL EVERY DAY (Patient not taking: Reported on 04/12/2019), Disp: 16 g, Rfl: 6 .  gabapentin (NEURONTIN)  300 MG capsule, Take 1 capsule (300 mg total) by mouth 3 (three) times daily for 30 days. At bedtime, Disp: 90 capsule, Rfl: 0 .  meloxicam (MOBIC) 15 MG tablet, Take 1 tablet (15 mg total) by mouth daily. (Patient not taking: Reported on 04/12/2019), Disp: 30 tablet, Rfl: 0 .  tiZANidine (ZANAFLEX) 4 MG tablet, Take 1 tablet (4 mg total) by mouth every 6 (six) hours as needed for muscle spasms. (Patient not taking: Reported on 04/12/2019), Disp: 30 tablet, Rfl: 0  No Known Allergies  I personally reviewed active problem list, medication list, allergies, notes from last encounter, lab results with the patient/caregiver today.  ROS  Ten systems reviewed and is negative except as mentioned in HPI  Objective  Virtual encounter, vitals not obtained.  Body mass index is 28.48 kg/m.  Nursing Note and Vital Signs reviewed.  Physical Exam  Constitutional: Patient appears well-developed and well-nourished. No distress.  HENT: Head: Normocephalic and atraumatic.  Neck: Normal range of motion. Pulmonary/Chest: Effort normal. No respiratory distress. Speaking in complete sentences Neurological: Pt is alert and oriented to person, place, and time. Coordination, speech and gait are normal. Face is symmetric. Psychiatric: Patient has a normal mood and affect. behavior is normal. Judgment and thought content normal.   No results found for this or any previous visit (from the past 72 hour(s)).  Assessment & Plan 1. Chronic sinusitis, unspecified location - Ambulatory referral to ENT - predniSONE (DELTASONE) 10 MG tablet; Day1:5tabs, Day2:4tabs, Day3:3tabs, Day4:2tabs, Day5:1tab  Dispense: 15 tablet; Refill: 0  2. Acute nonintractable headache, unspecified headache type - Reviewed signs and symptoms of stroke with him in detail.  He will present to ER if any of these symptoms occur.  We will trial prednisone taper to help with headache along with decreasing inflammation of the sinuses.  Will  call back in 4-5 days if not improving and we will consider additional imaging and/or neurology consult at that time. - predniSONE (DELTASONE) 10 MG tablet; Day1:5tabs, Day2:4tabs, Day3:3tabs, Day4:2tabs, Day5:1tab  Dispense: 15 tablet; Refill: 0   -Red flags and when to present for emergency care or RTC including fever >101.28F, chest pain, shortness of breath, new/worsening/un-resolving symptoms, reviewed with patient at time of visit. Follow up and care instructions discussed and provided in AVS. - I discussed the assessment and treatment plan with the patient. The patient was provided an opportunity to ask questions and all were answered. The patient agreed with the plan and demonstrated an understanding of the instructions.  I provided 14 minutes of non-face-to-face time during this encounter.  Hubbard Hartshorn, FNP

## 2019-04-12 NOTE — Telephone Encounter (Signed)
Patient had virtual appointment

## 2019-05-14 DIAGNOSIS — H524 Presbyopia: Secondary | ICD-10-CM | POA: Diagnosis not present

## 2019-05-16 DIAGNOSIS — H5213 Myopia, bilateral: Secondary | ICD-10-CM | POA: Diagnosis not present

## 2019-06-15 ENCOUNTER — Other Ambulatory Visit: Payer: Self-pay

## 2019-06-15 ENCOUNTER — Ambulatory Visit (INDEPENDENT_AMBULATORY_CARE_PROVIDER_SITE_OTHER): Payer: Medicaid Other | Admitting: Family Medicine

## 2019-06-15 ENCOUNTER — Encounter: Payer: Self-pay | Admitting: Family Medicine

## 2019-06-15 VITALS — Ht 73.0 in

## 2019-06-15 DIAGNOSIS — J339 Nasal polyp, unspecified: Secondary | ICD-10-CM

## 2019-06-15 DIAGNOSIS — J329 Chronic sinusitis, unspecified: Secondary | ICD-10-CM | POA: Diagnosis not present

## 2019-06-15 MED ORDER — AMOXICILLIN-POT CLAVULANATE 875-125 MG PO TABS: 1.0000 | ORAL_TABLET | Freq: Two times a day (BID) | ORAL | 0 refills | Status: DC

## 2019-06-15 MED ORDER — FLUTICASONE PROPIONATE 50 MCG/ACT NA SUSP: 1.0000 | Freq: Two times a day (BID) | NASAL | 0 refills | Status: DC
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
## 2019-07-02 DIAGNOSIS — H524 Presbyopia: Secondary | ICD-10-CM | POA: Diagnosis not present

## 2019-07-26 ENCOUNTER — Ambulatory Visit: Payer: Medicaid Other | Attending: Internal Medicine

## 2019-07-26 DIAGNOSIS — Z23 Encounter for immunization: Secondary | ICD-10-CM

## 2019-07-26 NOTE — Progress Notes (Signed)
   Covid-19 Vaccination Clinic  Name:  Wesley Adams    MRN: [id number] DOB: 07-02-1965  07/26/2019  [name] was observed post Covid-19 immunization for 15 minutes without incident. He was provided with Vaccine Information Sheet and instruction to access the V-Safe system.   [name] was instructed to call 911 with any severe reactions post vaccine: Marland Kitchen Difficulty breathing  . Swelling of face and throat  . A fast heartbeat  . A bad rash all over body  . Dizziness and weakness   Immunizations Administered    Name Date Dose VIS Date Route   Pfizer COVID-19 Vaccine 07/26/2019 10:02 AM 0.3 mL 04/06/2019 Intramuscular   Manufacturer: [place]   Lot: [id number]   [place]: [id number]

## 2019-08-21 ENCOUNTER — Ambulatory Visit: Payer: Medicaid Other | Attending: Internal Medicine

## 2019-08-21 DIAGNOSIS — Z23 Encounter for immunization: Secondary | ICD-10-CM

## 2019-08-21 NOTE — Progress Notes (Signed)
   Covid-19 Vaccination Clinic  Name:  Wesley Adams    MRN: [id number] DOB: 05-Mar-1966  08/21/2019  Wesley Adams was observed post Covid-19 immunization for 15 minutes without incident. He was provided with Vaccine Information Sheet and instruction to access the V-Safe system.   Wesley Adams was instructed to call 911 with any severe reactions post vaccine: Marland Kitchen Difficulty breathing  . Swelling of face and throat  . A fast heartbeat  . A bad rash all over body  . Dizziness and weakness   Immunizations Administered    Name Date Dose VIS Date Route   Pfizer COVID-19 Vaccine 08/21/2019  9:41 AM 0.3 mL 06/20/2018 Intramuscular   Manufacturer: [place]   Lot: [id number]   [place]: [id number]

## 2019-08-26 IMAGING — CT CT ANGIO CHEST-ABD-PELV FOR DISSECTION W/ AND WO/W CM
2 of 7 series · 11 of 36 positions shown, 15 images · IV contrast (APPLIED)
Comparison: CT abdomen pelvis dated April 29, 2017.

CLINICAL DATA: Intermittent chest pressure and worsening shortness
of breath since [REDACTED].

EXAM:
CT ANGIOGRAPHY CHEST, ABDOMEN AND PELVIS
TECHNIQUE: Multidetector CT imaging through the chest, abdomen and pelvis was
performed using the standard protocol during bolus administration of
intravenous contrast. Multiplanar reconstructed images and MIPs were
obtained and reviewed to evaluate the vascular anatomy.
CONTRAST:  100mL OMNIPAQUE IOHEXOL 350 MG/ML SOLN

[Series 5: axial arterial (person_name) · axial · arterial · 0.75mm/px · z∈[-461,+103]mm · 10 of 220 slices shown, 13 images]
[im 16/220  mediastinal]
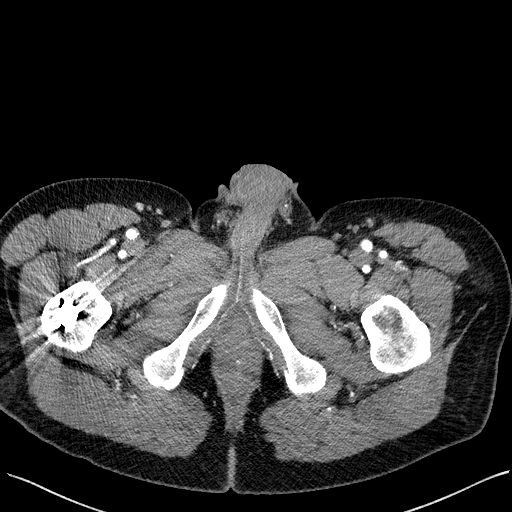
[im 16/220  bone]
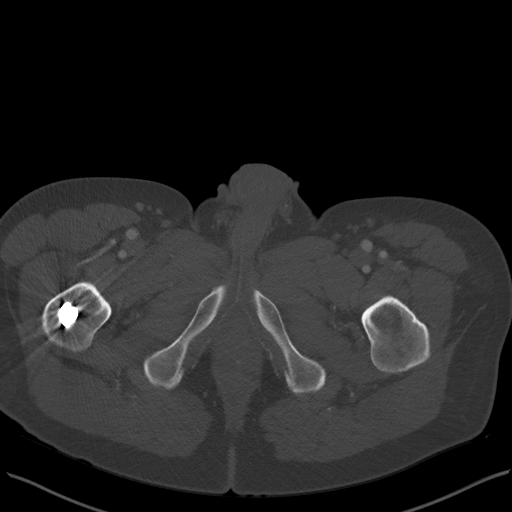
[im 47/220  mediastinal]
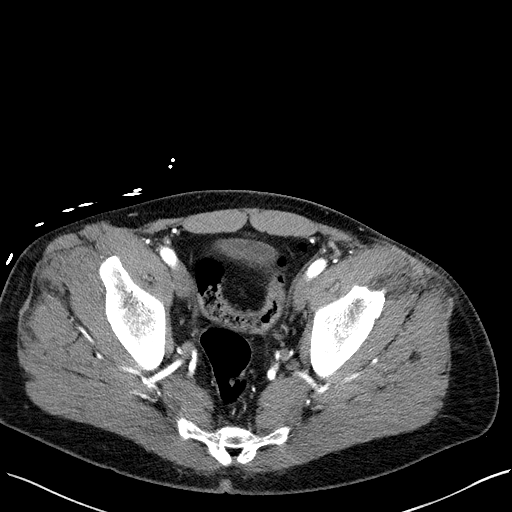
[im 79/220  mediastinal]
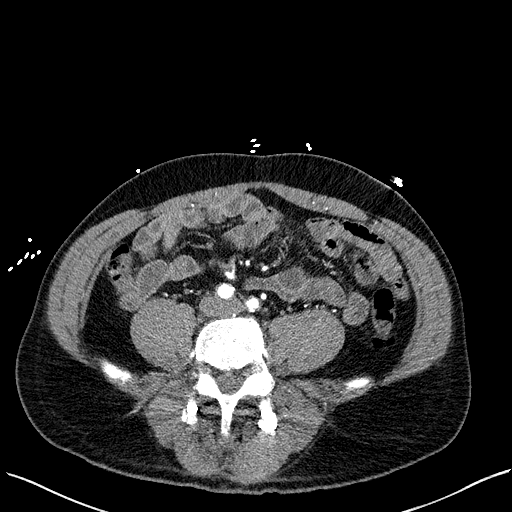
[im 94/220  mediastinal]
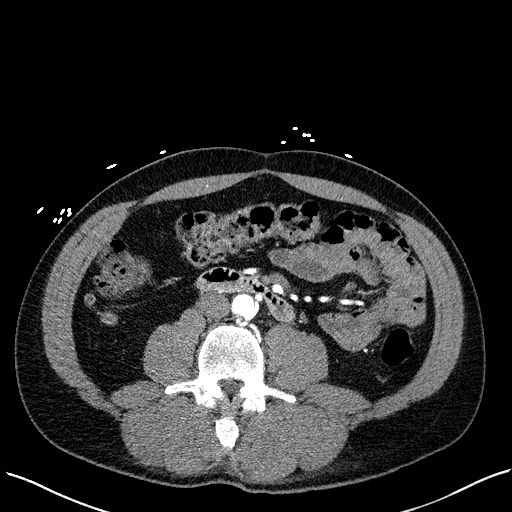
[im 126/220  mediastinal]
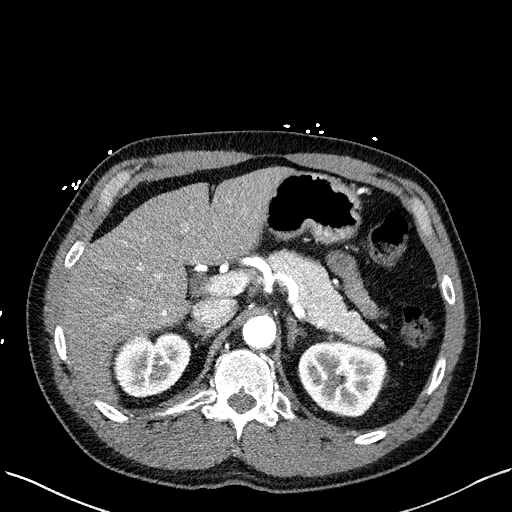
[im 141/220  mediastinal]
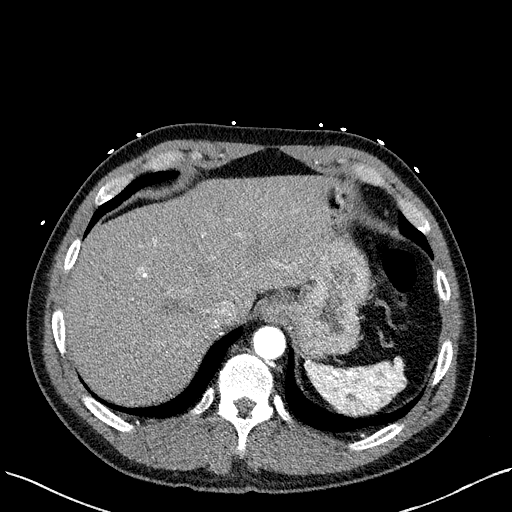
[im 157/220  lung]
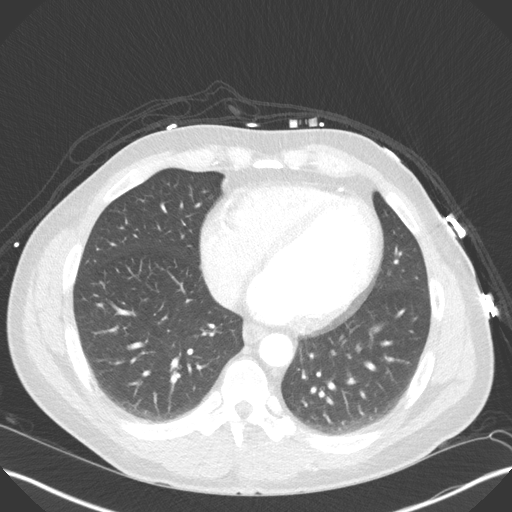
[im 173/220  mediastinal]
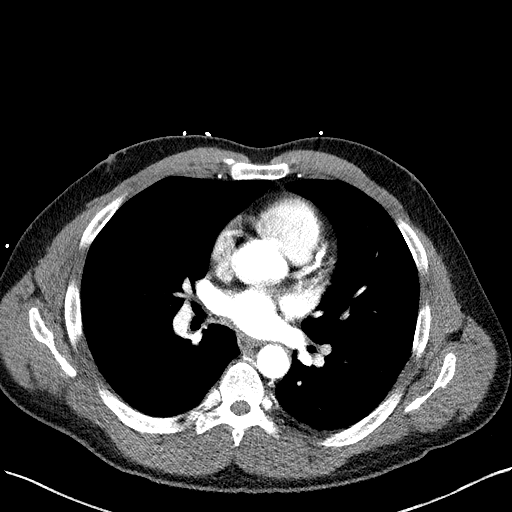
[im 173/220  lung]
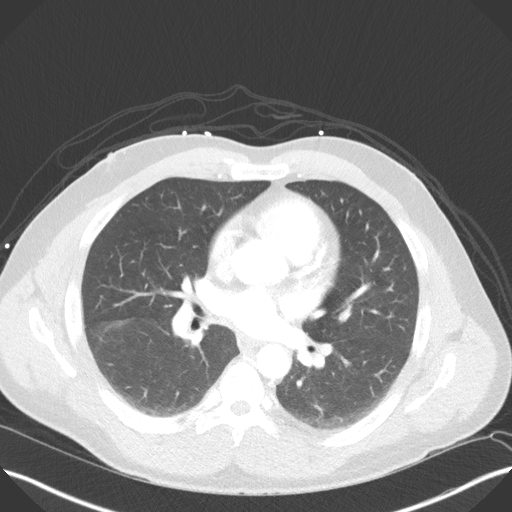
[im 188/220  lung]
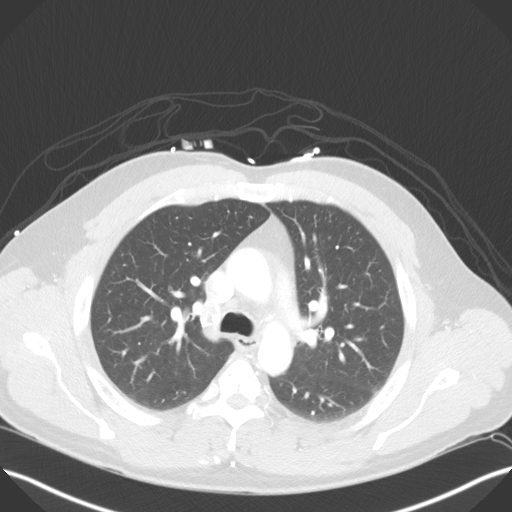
[im 204/220  mediastinal]
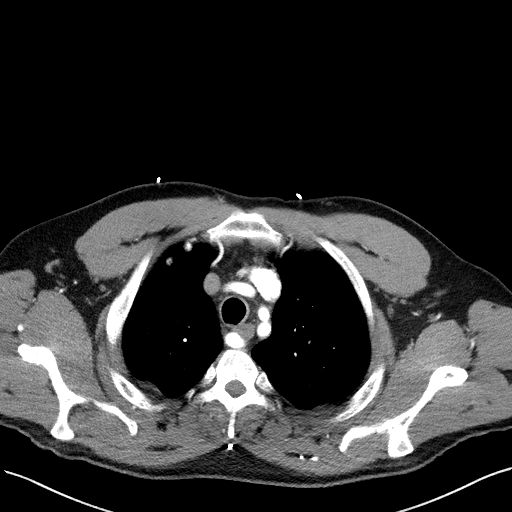
[im 204/220  lung]
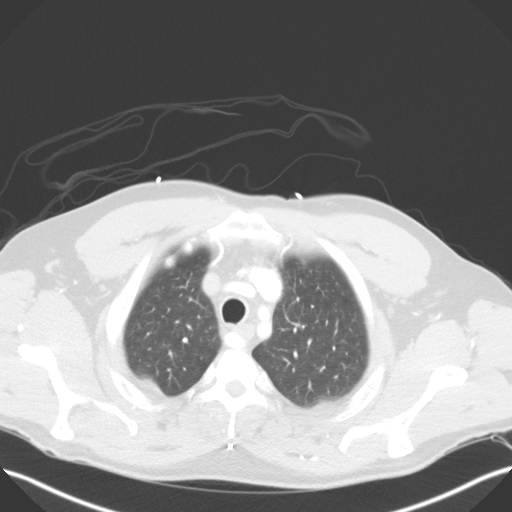

[Series 7: coronals · coronal · 0.71mm/px · 1 of 127 slices shown, 2 images]
[im 64/127  mediastinal]
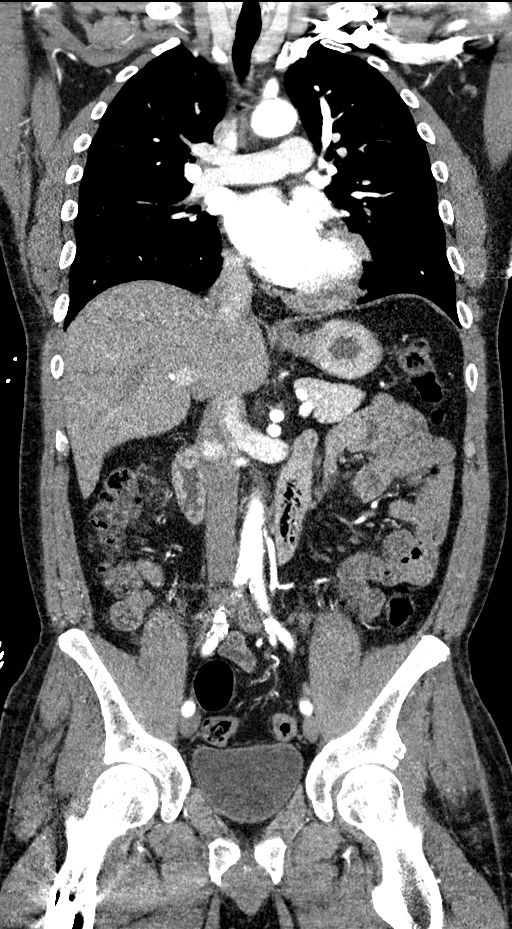
[im 64/127  bone]
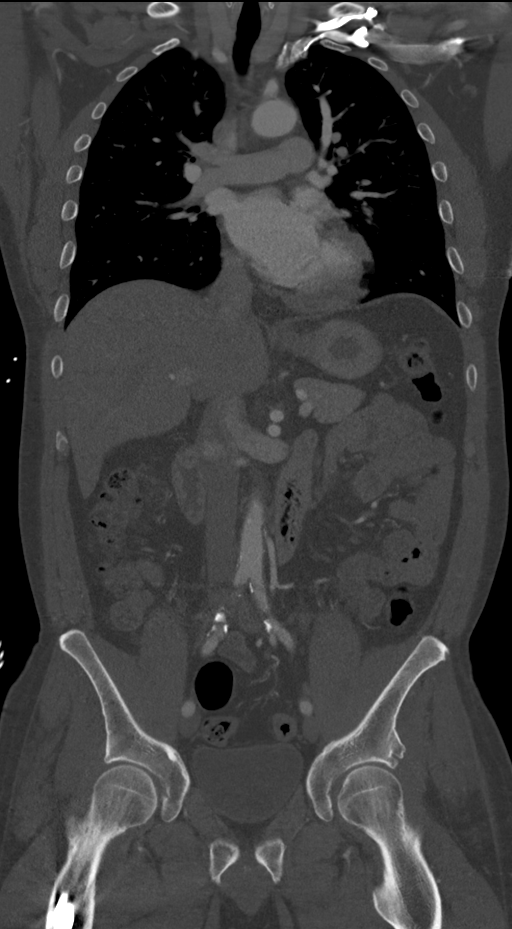

[11 of 36 positions shown; findings below may reference images not displayed]

FINDINGS: CTA CHEST FINDINGS

Cardiovascular: Preferential opacification of the thoracic aorta. No
evidence of thoracic aortic aneurysm or dissection. Aberrant origin
of the right subclavian artery. Coronary, aortic arch, and branch
vessel atherosclerotic vascular disease. normal heart size. No
pericardial effusion. No central pulmonary embolism.

Mediastinum/Nodes: No enlarged mediastinal, hilar, or axillary lymph
nodes. Thyroid gland, trachea, and esophagus demonstrate no
significant findings.

Lungs/Pleura: No focal consolidation, pleural effusion, or
pneumothorax. No suspicious pulmonary nodule.

Musculoskeletal: No chest wall abnormality. No acute or significant
osseous findings.

Review of the MIP images confirms the above findings.

CTA ABDOMEN AND PELVIS FINDINGS

VASCULAR

Aorta: Normal caliber aorta without aneurysm, dissection, vasculitis
or significant stenosis. Mild atherosclerosis.

Celiac: Patent without evidence of aneurysm, dissection, vasculitis
or significant stenosis.

SMA: Patent without evidence of aneurysm, dissection, vasculitis or
significant stenosis.

Renals: Single right and two left renal arteries are patent without
evidence of aneurysm, dissection, vasculitis, fibromuscular
dysplasia or significant stenosis.

IMA: Patent without evidence of aneurysm, dissection, vasculitis or
significant stenosis.

Inflow: Mild stenosis of the distal left common iliac artery due to
calcified and noncalcified atherosclerotic plaque. Otherwise patent
without evidence of aneurysm, dissection, vasculitis or significant
stenosis.

Veins: No obvious venous abnormality within the limitations of this
arterial phase study.

Review of the MIP images confirms the above findings.

NON-VASCULAR

Hepatobiliary: No focal liver abnormality is seen. No gallstones,
gallbladder wall thickening, or biliary dilatation.

Pancreas: Unremarkable. No pancreatic ductal dilatation or
surrounding inflammatory changes.

Spleen: Normal in size without focal abnormality.

Adrenals/Urinary Tract: Adrenal glands are unremarkable. Kidneys are
normal, without renal calculi, focal lesion, or hydronephrosis.
Unchanged simple cyst in the right kidney. Bladder is unremarkable.

Stomach/Bowel: Stomach is within normal limits. Appendix appears
normal. No evidence of bowel wall thickening, distention, or
inflammatory changes.

Lymphatic: No enlarged abdominal or pelvic lymph nodes.

Reproductive: Prostate is unremarkable.

Other: No abdominal wall hernia or abnormality. No abdominopelvic
ascites. No pneumoperitoneum.

Musculoskeletal: No acute or significant osseous findings. Unchanged
heterotopic ossification near the right greater trochanter and left
sartorius and rectus femoris origins. Chronic deformity of the left
iliac crest. Prior L4-L5 fusion. Interval removal of the spinal cord
stimulator in the left posterior abdominal wall.

Review of the MIP images confirms the above findings.
IMPRESSION: 1. No acute thoracoabdominal aortic pathology.  No aneurysm.
2.  Aortic atherosclerosis ([id number]).

## 2019-10-25 DIAGNOSIS — Z419 Encounter for procedure for purposes other than remedying health state, unspecified: Secondary | ICD-10-CM | POA: Diagnosis not present

## 2019-11-25 DIAGNOSIS — Z419 Encounter for procedure for purposes other than remedying health state, unspecified: Secondary | ICD-10-CM | POA: Diagnosis not present

## 2019-12-26 DIAGNOSIS — Z419 Encounter for procedure for purposes other than remedying health state, unspecified: Secondary | ICD-10-CM | POA: Diagnosis not present

## 2020-01-25 DIAGNOSIS — Z419 Encounter for procedure for purposes other than remedying health state, unspecified: Secondary | ICD-10-CM | POA: Diagnosis not present

## 2020-02-25 DIAGNOSIS — Z419 Encounter for procedure for purposes other than remedying health state, unspecified: Secondary | ICD-10-CM | POA: Diagnosis not present

## 2020-03-26 DIAGNOSIS — Z419 Encounter for procedure for purposes other than remedying health state, unspecified: Secondary | ICD-10-CM | POA: Diagnosis not present

## 2020-04-26 DIAGNOSIS — Z419 Encounter for procedure for purposes other than remedying health state, unspecified: Secondary | ICD-10-CM | POA: Diagnosis not present

## 2020-05-27 DIAGNOSIS — Z419 Encounter for procedure for purposes other than remedying health state, unspecified: Secondary | ICD-10-CM | POA: Diagnosis not present

## 2020-06-24 DIAGNOSIS — Z419 Encounter for procedure for purposes other than remedying health state, unspecified: Secondary | ICD-10-CM | POA: Diagnosis not present

## 2020-07-25 DIAGNOSIS — Z419 Encounter for procedure for purposes other than remedying health state, unspecified: Secondary | ICD-10-CM | POA: Diagnosis not present

## 2020-08-24 DIAGNOSIS — Z419 Encounter for procedure for purposes other than remedying health state, unspecified: Secondary | ICD-10-CM | POA: Diagnosis not present

## 2020-09-24 DIAGNOSIS — Z419 Encounter for procedure for purposes other than remedying health state, unspecified: Secondary | ICD-10-CM | POA: Diagnosis not present

## 2020-10-24 DIAGNOSIS — Z419 Encounter for procedure for purposes other than remedying health state, unspecified: Secondary | ICD-10-CM | POA: Diagnosis not present

## 2020-11-12 ENCOUNTER — Encounter: Payer: Self-pay | Admitting: Family Medicine

## 2020-11-12 ENCOUNTER — Ambulatory Visit: Payer: Medicaid Other | Admitting: Family Medicine

## 2020-11-12 ENCOUNTER — Other Ambulatory Visit: Payer: Self-pay

## 2020-11-12 VITALS — BP 124/62 | HR 87 | Temp 98.2°F | Resp 16 | Ht 73.0 in | Wt 188.0 lb

## 2020-11-12 DIAGNOSIS — Z23 Encounter for immunization: Secondary | ICD-10-CM | POA: Diagnosis not present

## 2020-11-12 DIAGNOSIS — I700 Atherosclerosis of aorta: Secondary | ICD-10-CM | POA: Diagnosis not present

## 2020-11-12 DIAGNOSIS — E7800 Pure hypercholesterolemia, unspecified: Secondary | ICD-10-CM

## 2020-11-12 DIAGNOSIS — G8929 Other chronic pain: Secondary | ICD-10-CM | POA: Diagnosis not present

## 2020-11-12 DIAGNOSIS — M5442 Lumbago with sciatica, left side: Secondary | ICD-10-CM | POA: Diagnosis not present

## 2020-11-12 DIAGNOSIS — Z79899 Other long term (current) drug therapy: Secondary | ICD-10-CM | POA: Diagnosis not present

## 2020-11-12 DIAGNOSIS — Z131 Encounter for screening for diabetes mellitus: Secondary | ICD-10-CM

## 2020-11-12 MED ORDER — ATORVASTATIN CALCIUM 40 MG PO TABS: 40.0000 mg | ORAL_TABLET | Freq: Every day | ORAL | 1 refills | Status: DC

## 2020-11-12 MED ORDER — GABAPENTIN 300 MG PO CAPS: 300.0000 mg | ORAL_CAPSULE | Freq: Every day | ORAL | 1 refills | Status: DC

## 2020-11-12 MED ORDER — MELOXICAM 15 MG PO TABS: 15.0000 mg | ORAL_TABLET | Freq: Every day | ORAL | 1 refills | Status: DC

## 2020-11-12 MED ORDER — TIZANIDINE HCL 4 MG PO TABS: 4.0000 mg | ORAL_TABLET | Freq: Three times a day (TID) | ORAL | 0 refills | Status: DC | PRN

## 2020-11-12 NOTE — Patient Instructions (Signed)
??? ????? ????? ??? ???????? Acute Back Pain, Adult ???? ????? ?????? ???? ??????? ????? ?? ???? ????? ?????. ??????? ?? ???? ???? ????? ?? ????? ?????? ???????. ??? ???? ??? ???????: ???? ??? ??????? ?? ??????? ???? ?? ?????? ?? ????? (??). ??? ??????? ??? ??????? ???? ???? ?????? ??????. ??? ???? ??? ??????? ?????? ??? ???? ??? ?? ?? ?????. ???? ????? (?????) ????? ?????? ??????. ?????? ????? ?????? ?????? ?? ???? ?????? ??? ???? ??? ???????? ??? ???? (?????) ?????? ??????. ????? ??????? ??? ???? ?? ????? ?????? ??????? ?? ??????? ????????. ???? ???? ?? ?????. ?????? ???????. ?? ????? ?? ??? ???? ????? ?????? ????? ??????? ?????? ??? ??????. ????? ?????? ???? ????? ?????? ?? ?????? ???????? ?? ??????. ???? ????????? ??????? ?? ??????: ??????? ??? ????? ??????? ??????? ?? ???? ??????? ????? ????? ????????? ???? ?? ???? ???? ?? ??????? ??? ????? ?? ??????? ??????? ????? ?? ?????? ???????. ????? ??????? ??? ??????? ???? ??????? ??????? ????? ????? ????? ???? ?? ??? ???? ????. ?? ???? ???? ??????? ?????? ???? ????? ??? ???? ????? ???? ??? 24-48 ???? ?? ????? ?????. ????? ??? ?????? ??????? ???????: ??? ????? ?? ??? ???????. ?? ????? ??? ???? ??????. ???? ????? ???? 20 ?????? ???? ??? ????? 2-3 ???? ??????. ??? ??????? ??? ??????? ??????? ??? ??? ?????? ???? ????? ?? ???? ??????? ??????? ??? ????? ??????? ????. ?????? ???? ??????? ???? ????? ?? ????? ??????? ?????? ??? ?????? ??????? ?? ????? ???????. ???? ?? ????? ??? ????? ????? ???????. ???? ???? ??????? ??? ?????? ???? ?????? ?? 20-30 ?????. ??? ???? ??????? ??? ???? ??? ????? ??? ?????? ??????. ???? ???? ??? ?????? ??? ??? ????? ???? ?????? ?????? ?? ??????? ?? ???????. ??? ?????? ??? ??????? ????. ??????  ?? ???? ?? ??????. ??????? ?? ?????? ????? ?? ??? ?? ????? ?? ???? ??????. ???? ??? ?? ??? ??????. ???? ??????? ?????? ????? ???? ?????? ?? ????? ????? ??? ??????. ??? ???? ??? ????? ???? ?????? ??? ??? ?? ???? ???????. ???? ???? ?????.  ???? ????? ?????? ??? ?????? ?? ??? ?????? ?????? 90 ???? (????? ?????). ???? ?? ???? ??? ??????? ?? ???? ??????? ??? ????? ???????. ?? ????? ?????? ????? ????? ????? (??????? ????????) ??? ???? ???????? ??? ??? ?????. ??? ?????? ??? ???? ?????? ????? ????? ??? ???. ???? ????? ??? ??? ????? ?? ???. ?? ???? ?? ??? ?? ??? ?? ???? ????? ????? ?? 30 ????? ?? ????? ???????. ??????? ?? ?????? ?????? ????? ?? ????? ???? ?? ???? ????? ??? ????. ?? ??? ??????? ?? ?????? ??????? ????? ??? ??? ????? ??????? ??????? ????? ???????? ?? ??? ??????. ???? ???????? ??????? ??? ??? ???????. ??? ??? ????? ???? ??? ???????? ???? ????? ????? ?????? ?? ????? ??? ????: ??? ??????. ???? ????? ?????? ?? ????. ???? ????????. ???? ???? ??????? ??????? ??? ??????? ???? ??????? ??????. ???????? ????? ?? ???? ????? ????? ???????? ?? ?????? ????? ??????? ??? ??? ??????? ????????. ??? ????????? ???????? ???? ????? ???? ?????? ????? ??? ??? ?????? ???? ??????? ?????? ??????? ??. ??? ????? ?????? ??????? ??? ???? ??? ??????? ??????? ?????? ??????? ??????? ??.  ??? ?????? ???? ??? ???? ?? ?????? ??????. ???? ??? ????? ?????? ?????? ????? ??? ???? ????? ?? ????? ?????? ??? ????? ??????. ???? ??????? ?? ??????? ???? ????? ???? ?????? ?? ?????. ????? ????? ?????? ?????? ?? ????? ???? ??????? ????? ?? ???? ??? ????? ?????. ???? ??? ??????? ??? ????? ??????? ??? ?????? ???????. ??????? ???? ??? ????? ?? ??? ???? ??? ????? ????. ???? ????? ??? ???? ?? ??? ???????? ??????. ??? ??????? ??? ????? ??? ????? ???? ??????. ???? ??? ????? ??? ??????? ???? ??????? ??????. ????? ???? ???: ?????? ??????? ?? ???????. ?????? ?????? ?????? ?? ???????. ?????? ?????? ?? ??????. ???? ?????? ??????? ?????? ?? ??????? ???????: ?????? ???? ?? ??? ?? ?????? ?? ??????. ?????? ???? ?????? ???? ?????? ??? ????? ?? ???????. ??? ???? ????? ??? ???????. ??????? ?????? ?? ??????. ??? ???? ????? ??? ?????? ???. ??????? ???? ?? ?????. ???? ???????? ????? ??  ??????? ???????: ??????? ?????? ????? ?? ??????? ?? ???????. ?????? ?????? ?? ??? ??? ??????? ?? ???????? ?? ???????. ??????? ?????? ?? ???. ????? ???? ?? ?????. ?????? ??????. ???? ???? ????? ?????? ???? ??????? ????? ?? ???? ????? ?????. ???? ???????? ??????? ??? ??? ???????. ??? ??? ????? ???? ??? ???????? ???? ????? ????? ?????? ?? ????? ??? ????. ????? ??????? ???? ????? ????? ???? ?? ??? ???? ???? ??? ????? ?????? ?? ?????? ??? ??? ?? ????? ?? ???? ??????? ??????. ??? ????? ?? ??? ????????? ?? ???? ?????? ????????? ???? ?????? ???? ?????????????. ???? ?? ?????? ??? ????? ???? ?? ???? ?? ???? ??????? ??????Marland Kitchen?  Document Revised: 01/31/2020 Document Reviewed: 01/31/2020 Elsevier Patient Education  2022 [employer].
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
## 2020-11-13 ENCOUNTER — Telehealth: Payer: Self-pay

## 2020-11-13 ENCOUNTER — Other Ambulatory Visit: Payer: Self-pay | Admitting: Family Medicine

## 2020-11-13 LAB — COMPLETE METABOLIC PANEL WITH GFR
AG Ratio: 1.9 (calc) (ref 1.0–2.5)
ALT: 14 U/L (ref 9–46)
AST: 20 U/L (ref 10–35)
Albumin: 4.5 g/dL (ref 3.6–5.1)
Alkaline phosphatase (APISO): 54 U/L (ref 35–144)
BUN: 14 mg/dL (ref 7–25)
CO2: 27 mmol/L (ref 20–32)
Calcium: 9.6 mg/dL (ref 8.6–10.3)
Chloride: 107 mmol/L (ref 98–110)
Creat: 1.02 mg/dL (ref 0.70–1.30)
Globulin: 2.4 g/dL (calc) (ref 1.9–3.7)
Glucose, Bld: 101 mg/dL — ABNORMAL HIGH (ref 65–99)
Potassium: 4 mmol/L (ref 3.5–5.3)
Sodium: 141 mmol/L (ref 135–146)
Total Bilirubin: 0.4 mg/dL (ref 0.2–1.2)
Total Protein: 6.9 g/dL (ref 6.1–8.1)
eGFR: 87 mL/min/{1.73_m2} (ref >=60)

## 2020-11-13 LAB — LIPID PANEL
Cholesterol: 186 mg/dL (ref <200)
HDL: 62 mg/dL (ref >=40)
LDL Cholesterol (Calc): 104 mg/dL (calc) — ABNORMAL HIGH
Non-HDL Cholesterol (Calc): 124 mg/dL (calc) (ref <130)
Total CHOL/HDL Ratio: 3 (calc) (ref <5.0)
Triglycerides: 103 mg/dL (ref <150)

## 2020-11-13 LAB — HEMOGLOBIN A1C
Hgb A1c MFr Bld: 4.8 % of total Hgb (ref <5.7)
Mean Plasma Glucose: 91 mg/dL
eAG (mmol/L): 5 mmol/L

## 2020-11-13 LAB — CBC WITH DIFFERENTIAL/PLATELET
Absolute Monocytes: 623 cells/uL (ref 200–950)
Basophils Absolute: 8 cells/uL (ref 0–200)
Basophils Relative: 0.1 %
Eosinophils Absolute: 23 cells/uL (ref 15–500)
Eosinophils Relative: 0.3 %
HCT: 46.2 % (ref 38.5–50.0)
Hemoglobin: 15.1 g/dL (ref 13.2–17.1)
Lymphs Abs: 2048 cells/uL (ref 850–3900)
MCH: 30.4 pg (ref 27.0–33.0)
MCHC: 32.7 g/dL (ref 32.0–36.0)
MCV: 93.1 fL (ref 80.0–100.0)
MPV: 11.4 fL (ref 7.5–12.5)
Monocytes Relative: 8.3 %
Neutro Abs: 4800 cells/uL (ref 1500–7800)
Neutrophils Relative %: 64 %
Platelets: 205 10*3/uL (ref 140–400)
RBC: 4.96 10*6/uL (ref 4.20–5.80)
RDW: 13.3 % (ref 11.0–15.0)
Total Lymphocyte: 27.3 %
WBC: 7.5 10*3/uL (ref 3.8–10.8)

## 2020-11-13 MED ORDER — METHYLPREDNISOLONE 4 MG PO TBPK: ORAL_TABLET | ORAL | 0 refills | Status: DC

## 2020-11-13 NOTE — Telephone Encounter (Signed)
Would like the prednisone sent to [facility]

## 2020-11-14 ENCOUNTER — Telehealth: Payer: Self-pay

## 2020-11-14 NOTE — Telephone Encounter (Signed)
Pt not doing any better and would like a referral to pain management (the same one he had the last time).

## 2020-11-17 ENCOUNTER — Other Ambulatory Visit: Payer: Self-pay | Admitting: Family Medicine

## 2020-11-17 ENCOUNTER — Telehealth: Payer: Self-pay

## 2020-11-17 DIAGNOSIS — M5441 Lumbago with sciatica, right side: Secondary | ICD-10-CM

## 2020-11-17 DIAGNOSIS — Z9889 Other specified postprocedural states: Secondary | ICD-10-CM

## 2020-11-17 NOTE — Telephone Encounter (Signed)
Left vm for pt

## 2020-11-17 NOTE — Telephone Encounter (Signed)
Pt is still in a lot of pain and is requesting a note for work to cover today and tomorrow

## 2020-11-17 NOTE — Telephone Encounter (Signed)
Pt wants to go to Pain management at [facility]. Pt called in today he is in a lot of pain. He cannot sleep or sit down for long periods of time. He has a thick blue bruise on right side lower back. Was seen by [facility] Dr Arnoldo Morale and would like a MRI scheduled if possible.
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
## 2020-11-20 ENCOUNTER — Encounter: Payer: Self-pay | Admitting: Family Medicine

## 2020-11-20 ENCOUNTER — Ambulatory Visit: Payer: Medicaid Other | Admitting: Family Medicine

## 2020-11-20 ENCOUNTER — Other Ambulatory Visit: Payer: Self-pay

## 2020-11-20 DIAGNOSIS — M5442 Lumbago with sciatica, left side: Secondary | ICD-10-CM

## 2020-11-20 DIAGNOSIS — G8929 Other chronic pain: Secondary | ICD-10-CM

## 2020-11-20 MED ORDER — LIDOCAINE 5 % EX PTCH: 2.0000 | MEDICATED_PATCH | Freq: Two times a day (BID) | CUTANEOUS | 0 refills | Status: DC | PRN

## 2020-11-20 NOTE — Patient Instructions (Signed)
Referral has been sent to [place]   P: [phone] F: [phone]

## 2020-11-24 DIAGNOSIS — Z419 Encounter for procedure for purposes other than remedying health state, unspecified: Secondary | ICD-10-CM | POA: Diagnosis not present

## 2020-11-25 ENCOUNTER — Ambulatory Visit
Payer: BC Managed Care – PPO | Attending: Student in an Organized Health Care Education/Training Program | Admitting: Student in an Organized Health Care Education/Training Program

## 2020-11-25 ENCOUNTER — Encounter: Payer: Self-pay | Admitting: Student in an Organized Health Care Education/Training Program

## 2020-11-25 ENCOUNTER — Telehealth: Payer: Self-pay

## 2020-11-25 ENCOUNTER — Other Ambulatory Visit: Payer: Self-pay

## 2020-11-25 VITALS — BP 121/77 | HR 70 | Temp 97.1°F | Resp 14 | Ht 73.0 in | Wt 187.0 lb

## 2020-11-25 DIAGNOSIS — Z981 Arthrodesis status: Secondary | ICD-10-CM | POA: Insufficient documentation

## 2020-11-25 DIAGNOSIS — M5416 Radiculopathy, lumbar region: Secondary | ICD-10-CM | POA: Insufficient documentation

## 2020-11-25 DIAGNOSIS — M5136 Other intervertebral disc degeneration, lumbar region: Secondary | ICD-10-CM | POA: Diagnosis not present

## 2020-11-25 DIAGNOSIS — G894 Chronic pain syndrome: Secondary | ICD-10-CM | POA: Diagnosis not present

## 2020-11-25 DIAGNOSIS — M961 Postlaminectomy syndrome, not elsewhere classified: Secondary | ICD-10-CM | POA: Diagnosis not present

## 2020-11-25 DIAGNOSIS — G8929 Other chronic pain: Secondary | ICD-10-CM | POA: Insufficient documentation

## 2020-11-25 MED ORDER — PREGABALIN 50 MG PO CAPS: 50.0000 mg | ORAL_CAPSULE | Freq: Two times a day (BID) | ORAL | 2 refills | Status: DC

## 2020-11-25 NOTE — Patient Instructions (Signed)

## 2020-11-25 NOTE — Progress Notes (Signed)
PROVIDER NOTE: Information contained herein reflects review and annotations entered in association with encounter. Interpretation of such information and data should be left to medically-trained personnel. Information provided to patient can be located elsewhere in the medical record under "Patient Instructions". Document created using STT-dictation technology, any transcriptional errors that may result from process are unintentional.    Patient: Wesley Adams  Service Category: E/M  Provider: Gillis Santa, MD  DOB: 1965-08-31  DOS: 11/25/2020  Specialty: Interventional Pain Management  MRN: [id number]  Setting: Ambulatory outpatient  PCP: Steele Sizer, MD  Type: Established Patient    Referring Provider: Steele Sizer, MD  Location: Office  Delivery: Face-to-face     HPI  [name], a 55 y.o. year old male, is here today because of his Chronic pain syndrome [G89.4]. [name] primary complain today is Back Pain (Lower, worse on right side)  Pertinent problems: Wesley Adams has Chronic bilateral low back pain with bilateral sciatica; Chronic pain syndrome; Lumbar spondylosis; History of lumbar fusion (L4/5); Failed back surgical syndrome; and Chronic radicular lumbar pain on their pertinent problem list. Pain Assessment: Severity of Chronic pain is reported as a 10-Worst pain ever/10. Location: Back Lower/right upper leg, with numbness in right leg. Onset:  . Quality: Burning, Other (Comment) (pinching). Timing: Constant. Modifying factor(s): nothing. Vitals:  height is 6' 1"  (1.854 m) and weight is 187 lb (84.8 kg). His temporal temperature is 97.1 F (36.2 C) (abnormal). His blood pressure is 121/77 and his pulse is 70. His respiration is 14 and oxygen saturation is 100%.   Reason for encounter: worsening of previously known (established) problem   [name] presents today with an increase in his low back pain with radiation into his right buttock and left leg.  Of note patient  has a history of L4-L5 decompression, instrumentation, fusion that was done in 2016 with Dr. Arnoldo Morale.  He is status post spinal cord stimulator implant which was not beneficial and subsequently removed in 2019.  Patient states that towards the end of July when he was working at the [employer], he bent over to pick up a box that was approximately 60 pounds noticed acute onset of left lower back pain that radiated into his left leg at that time.  He has been having increased lower extremity neuropathic pain since then and today it is worse on the right side.  He has tried a prednisone taper in the past with his PCP.  He is unable to sleep and states that his pain has been worsening.  He has tried physical therapy in the past and is fairly active at work as well although he states that he is limited in his ability to do any work activities and is considering transitioning to more of a desk based routine at his job.  He is currently on gabapentin which was increased to 600 mg nightly.  He states that it was initially providing him with pain relief but no longer.  He denies having tried Lyrica.  Previous lumbar MRI was greater than 2 years ago.  Given acute onset of lumbar radicular pain with associated weakness that has been worsening over the last 55 weeks, recommend repeat lumbar MRI with and without contrast, epidural steroid injection as well as initiation of Lyrica as below.  We also had a discussion about spinal cord stimulation.  Of note patient does utilize [facility] and states that it is somewhat helpful for his pain.  For this reason, we will avoid any  opioid analgesics.   ROS  Constitutional: Denies any fever or chills Gastrointestinal: No reported hemesis, hematochezia, vomiting, or acute GI distress Musculoskeletal:  LBP + R>L leg pain Neurological: No reported episodes of acute onset apraxia, aphasia, dysarthria, agnosia, amnesia, paralysis, loss of coordination, or loss of consciousness  Medication  Review  atorvastatin, meloxicam, pregabalin, and tiZANidine  History Review  Allergy: Wesley Adams has No Known Allergies. Drug: Wesley Adams  reports no history of drug use. Alcohol:  reports current alcohol use. Tobacco:  reports that he has been smoking cigarettes. He started smoking about 37 years ago. He has a 35.00 pack-year smoking history. He has never used smokeless tobacco. Social: Wesley Adams  reports that he has been smoking cigarettes. He started smoking about 37 years ago. He has a 35.00 pack-year smoking history. He has never used smokeless tobacco. He reports current alcohol use. He reports that he does not use drugs. Medical:  has a past medical history of Chronic back pain, Flu, and Spinal cord stimulator status (04/29/2017). Surgical: Wesley Adams  has a past surgical history that includes Spinal cord stimulator insertion (03/23/2017); Colonoscopy with propofol (N/A, 06/01/2017); Esophagogastroduodenoscopy (egd) with propofol (06/01/2017); polypectomy (06/01/2017); Spinal cord stimulator removal (N/A, 06/16/2017); LEFT HEART CATH AND CORONARY ANGIOGRAPHY (N/A, 12/08/2017); Posterior laminectomy / decompression lumbar spine (01/30/2015); and Femur fracture surgery (Right, 1985). Family: family history includes Alcohol abuse in his mother; Heart attack in his father and paternal grandfather; Heart disease in his father; Obesity in his father.  Laboratory Chemistry Profile   Renal Lab Results  Component Value Date   BUN 14 11/12/2020   CREATININE 1.02 14/97/0263   BCR NOT APPLICABLE 78/58/8502   GFRAA 118 01/02/2018   GFRNONAA 102 01/02/2018    Hepatic Lab Results  Component Value Date   AST 20 11/12/2020   ALT 14 11/12/2020   ALBUMIN 4.4 12/06/2017   ALKPHOS 64 12/06/2017   LIPASE 41 12/06/2017    Electrolytes Lab Results  Component Value Date   NA 141 11/12/2020   K 4.0 11/12/2020   CL 107 11/12/2020   CALCIUM 9.6 11/12/2020    Bone No results found for:  VD25OH, VD125OH2TOT, DX4128NO6, VE7209OB0, 25OHVITD1, 25OHVITD2, 25OHVITD3, TESTOFREE, TESTOSTERONE  Inflammation (CRP: Acute Phase) (ESR: Chronic Phase) No results found for: CRP, ESRSEDRATE, LATICACIDVEN       Note: Above Lab results reviewed.  Recent Imaging Review  CT Head Wo Contrast CLINICAL DATA:  Sinus pressure and headache for the past 5 days. The pain is behind the right eye and worsening.  EXAM: CT HEAD WITHOUT CONTRAST  TECHNIQUE: Contiguous axial images were obtained from the base of the skull through the vertex without intravenous contrast.  COMPARISON:  None.  FINDINGS: Brain: Normal appearing cerebral hemispheres and posterior fossa structures. Normal size and position of the ventricles. No intracranial hemorrhage, mass lesion or CT evidence of acute infarction.  Vascular: No hyperdense vessel or unexpected calcification.  Skull: Normal. Negative for fracture or focal lesion.  Sinuses/Orbits: Completely opacified right maxillary sinus with diffuse wall thickening and erosion through the medial wall of the sinus, into the nasal cavity. There is also marked mucosal thickening in the right ethmoid and frontal sinuses. 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 Unremarkable orbits.  Other: None.  IMPRESSION: 1. No intracranial abnormality. 2. 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 3. 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  Electronically Signed   By: Claudie Revering  M.D.   On: 04/06/2019 10:22 Note: Reviewed        Physical Exam  General appearance: Well nourished, well developed, and well hydrated. In no apparent acute distress Mental status: Alert, oriented x 3 (person, place, & time)       Respiratory: No evidence of acute respiratory distress Eyes: PERLA Vitals: BP 121/77   Pulse 70   Temp (!) 97.1 F (36.2 C) (Temporal)   Resp 14   Ht 6' 1"  (1.854 m)   Wt  187 lb (84.8 kg)   SpO2 100%   BMI 24.67 kg/m  BMI: Estimated body mass index is 24.67 kg/m as calculated from the following:   Height as of this encounter: 6' 1"  (1.854 m).   Weight as of this encounter: 187 lb (84.8 kg). Ideal: Ideal body weight: 79.9 kg (176 lb 2.4 oz) Adjusted ideal body weight: 81.9 kg (180 lb 7.8 oz)  Lumbar Spine Area Exam  Skin & Axial Inspection: Well healed scar from previous spine surgery detected Alignment: Symmetrical Functional ROM: Decreased ROM       Stability: No instability detected Muscle Tone/Strength: Functionally intact. No obvious neuro-muscular anomalies detected. Sensory (Neurological): Dermatomal pain pattern Palpation: Complains of area being tender to palpation       Provocative Tests: Lumbar Hyperextension and rotation test: Positive due to fusion restriction. Lumbar Lateral bending test: Positive ipsilateral radicular pain, bilaterally. Positive for bilateral foraminal stenosis. Patrick's Maneuver: evaluation deferred today                     Gait & Posture Assessment  Ambulation: Unassisted Gait: Relatively normal for age and body habitus Posture: WNL    Lower Extremity Exam      Side: Right lower extremity   Side: Left lower extremity  Skin & Extremity Inspection: Skin color, temperature, and hair growth are WNL. No peripheral edema or cyanosis. No masses, redness, swelling, asymmetry, or associated skin lesions. No contractures.   Skin & Extremity Inspection: Skin color, temperature, and hair growth are WNL. No peripheral edema or cyanosis. No masses, redness, swelling, asymmetry, or associated skin lesions. No contractures.  Functional ROM: Unrestricted ROM           Functional ROM: Unrestricted ROM          Muscle Tone/Strength: Functionally intact. No obvious neuro-muscular anomalies detected.   Muscle Tone/Strength: Functionally intact. No obvious neuro-muscular anomalies detected.  Sensory (Neurological): Unimpaired   Sensory  (Neurological): Unimpaired  Palpation: No palpable anomalies   Palpation: No palpable anomalies    Assessment   Status Diagnosis  Persistent Persistent Persistent 1. Chronic pain syndrome   2. 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 Failed back surgical syndrome   4. Lumbar radiculopathy (R>L)   5. Chronic radicular lumbar pain   6. Other intervertebral disc degeneration, lumbar region      Updated Problems: Problem  History of lumbar fusion (L4/5)  Failed Back Surgical Syndrome  Chronic Radicular Lumbar Pain  Lumbar Spondylosis  Chronic Bilateral Low Back Pain With Bilateral Sciatica  Chronic Pain Syndrome  Other Intervertebral Disc Degeneration, Lumbar Region    Plan of Care  Problem-specific:  No problem-specific Assessment & Plan notes found for this encounter.  Wesley Adams has a current medication list which includes the following long-term medication(s): atorvastatin and pregabalin.  Pharmacotherapy (Medications Ordered): Meds ordered this encounter  Medications   pregabalin (LYRICA) 50 MG capsule    Sig: Take 1 capsule (50 mg  total) by mouth 2 (two) times daily.    Dispense:  60 capsule    Refill:  2    Fill one day early if pharmacy is closed on scheduled refill date. May substitute for generic if available.   Orders:  Orders Placed This Encounter  Procedures   Lumbar Epidural Injection    Standing Status:   Future    Standing Expiration Date:   12/26/2020    Scheduling Instructions:     Procedure: Interlaminar Lumbar Epidural Steroid injection (LESI)            Laterality: Midline     Sedation: without     Timeframe: ASAA    Order Specific Question:   Where will this procedure be performed?    Answer:   [facility]   MR LUMBAR SPINE W WO CONTRAST    Patient presents with axial pain with possible radicular component.  In addition to any acute findings, please report on:  1. Facet (Zygapophyseal) joint DJD (Hypertrophy, space narrowing,  subchondral sclerosis, and/or osteophyte formation) 2. DDD and/or IVDD (Loss of disc height, desiccation or "Black disc disease") 3. Pars defects 4. Spondylolisthesis, spondylosis, and/or spondyloarthropathies (include Degree/Grade of displacement in mm) 5. Vertebral body Fractures, including age (old, new/acute) 69. Modic Type Changes 7. Demineralization 8. Bone pathology 9. Central, Lateral Recess, and/or Foraminal Stenosis (include AP diameter of stenosis in mm) 10. Surgical changes (hardware type, status, and presence of fibrosis) NOTE: Please specify level(s) and laterality.    Standing Status:   Future    Standing Expiration Date:   12/26/2020    Scheduling Instructions:     Imaging must be done as soon as possible. Inform patient that order will expire within 30 days and I will not renew it.    Order Specific Question:   If indicated for the ordered procedure, I authorize the administration of contrast media per Radiology protocol    Answer:   Yes    Order Specific Question:   What is the patient's sedation requirement?    Answer:   No Sedation    Order Specific Question:   Does the patient have a pacemaker or implanted devices?    Answer:   No    Order Specific Question:   Call Results- Best Contact Number?    Answer:   [phone] ([facility])    Order Specific Question:   Radiology Contrast Protocol - do NOT remove file path    Answer:   [email or web address]    Order Specific Question:   Preferred imaging location?    Answer:   [facility] (table limit-350lbs)   Follow-up plan:   Return in about 3 weeks (around 12/16/2020) for L-ESI (fusion)  , without sedation.        Recent Visits No visits were found meeting these conditions. Showing recent visits within past 90 days and meeting all other requirements Today's Visits Date Type Provider Dept  11/25/20 Office Visit Gillis Santa, MD [facility]  Showing today's visits  and meeting all other requirements Future Appointments No visits were found meeting these conditions. Showing future appointments within next 90 days and meeting all other requirements I discussed the assessment and treatment plan with the patient. The patient was provided an opportunity to ask questions and all were answered. The patient agreed with the plan and demonstrated an understanding of the instructions.  Patient advised to call back or seek an in-person evaluation if the symptoms or condition worsens.  Duration  of encounter: 12mnutes.  Note by: BGillis Santa MD Date: 11/25/2020; Time: 9:49 AM

## 2020-11-25 NOTE — Telephone Encounter (Addendum)
So for Mr. Hoganson MRI request they dont want to approve because although you documented that he had tried home exercise or PT the number of visits and dates were not documented. They are getting very strict on the criteria for approving these MRI's and procedures. 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 Do you want me to proceed with trying to get auth for the Lesi? They probably wont approve it because we dont have imaging which makes no sense but that's how they work.

## 2020-11-25 NOTE — Progress Notes (Signed)
Safety precautions to be maintained throughout the outpatient stay will include: orient to surroundings, keep bed in low position, maintain call bell within reach at all times, provide assistance with transfer out of bed and ambulation.  

## 2020-12-01 ENCOUNTER — Telehealth: Payer: Self-pay

## 2020-12-01 NOTE — Telephone Encounter (Signed)
Pt says Rx was never sent to pharmacy. He was unable to go back to work today as planned. Will need another wok note also.

## 2020-12-01 NOTE — Telephone Encounter (Signed)
I called [facility], they did not receive the script for Pregabalin. I called it in as a verbal order. Patient notified.

## 2020-12-02 ENCOUNTER — Telehealth: Payer: Self-pay

## 2020-12-02 ENCOUNTER — Other Ambulatory Visit: Payer: Self-pay

## 2020-12-02 DIAGNOSIS — M5442 Lumbago with sciatica, left side: Secondary | ICD-10-CM

## 2020-12-02 DIAGNOSIS — G8929 Other chronic pain: Secondary | ICD-10-CM

## 2020-12-02 MED ORDER — TIZANIDINE HCL 4 MG PO TABS: 4.0000 mg | ORAL_TABLET | Freq: Three times a day (TID) | ORAL | 0 refills | Status: DC | PRN

## 2020-12-02 NOTE — Telephone Encounter (Signed)
Pt needs refill on Tizanddine to be sent to [facility]

## 2020-12-04 ENCOUNTER — Other Ambulatory Visit: Payer: Self-pay | Admitting: Family Medicine

## 2020-12-04 DIAGNOSIS — G8929 Other chronic pain: Secondary | ICD-10-CM

## 2020-12-04 NOTE — Telephone Encounter (Signed)
Last seen 7.28.2022 no upcoming appt

## 2020-12-16 ENCOUNTER — Telehealth: Payer: Self-pay

## 2020-12-16 NOTE — Telephone Encounter (Signed)
Requesting a note for light duty due to still having back issue

## 2020-12-16 NOTE — Telephone Encounter (Signed)
Tried to contact pt to see if he had asked Dr Milana Huntsman about a note for light duty. No answer and vm did not pick up

## 2020-12-25 DIAGNOSIS — Z419 Encounter for procedure for purposes other than remedying health state, unspecified: Secondary | ICD-10-CM | POA: Diagnosis not present

## 2020-12-30 ENCOUNTER — Other Ambulatory Visit: Payer: Self-pay

## 2020-12-30 ENCOUNTER — Other Ambulatory Visit: Payer: Self-pay | Admitting: Family Medicine

## 2020-12-30 ENCOUNTER — Ambulatory Visit
Payer: Medicaid Other | Attending: Student in an Organized Health Care Education/Training Program | Admitting: Student in an Organized Health Care Education/Training Program

## 2020-12-30 DIAGNOSIS — Z981 Arthrodesis status: Secondary | ICD-10-CM

## 2020-12-30 DIAGNOSIS — M961 Postlaminectomy syndrome, not elsewhere classified: Secondary | ICD-10-CM

## 2020-12-30 DIAGNOSIS — M5416 Radiculopathy, lumbar region: Secondary | ICD-10-CM | POA: Diagnosis not present

## 2020-12-30 DIAGNOSIS — G894 Chronic pain syndrome: Secondary | ICD-10-CM

## 2020-12-30 DIAGNOSIS — G8929 Other chronic pain: Secondary | ICD-10-CM

## 2020-12-30 NOTE — Progress Notes (Signed)
I attempted to call the patient however no response. Voicemail left instructing patient to call front desk office at [phone] to reschedule appointment. -Dr Edyn Qazi  

## 2020-12-30 NOTE — Addendum Note (Signed)
Addended by: Gillis Santa on: 12/30/2020 03:46 PM   Modules accepted: Level of Service

## 2020-12-30 NOTE — Progress Notes (Addendum)
Patient: Wesley Adams  Service Category: E/M  Provider: Gillis Santa, MD  DOB: [birth year]  DOS: 12/30/2020  Location: Office  MRN: [id number]  Setting: Ambulatory outpatient  Referring Provider: Steele Sizer, MD  Type: Established Patient  Specialty: Interventional Pain Management  PCP: Steele Sizer, MD  Location: Remote location  Delivery: TeleHealth     Virtual Encounter - Pain Management PROVIDER NOTE: Information contained herein reflects review and annotations entered in association with encounter. Interpretation of such information and data should be left to medically-trained personnel. Information provided to patient can be located elsewhere in the medical record under "Patient Instructions". Document created using STT-dictation technology, any transcriptional errors that may result from process are unintentional.    Contact & Pharmacy Preferred: [phone] Home: [phone] (home) Mobile: [phone] (mobile) E-mail: [email or web address]  [id number] [place] - [address] [id number]: [phone]: [phone]  Pre-screening  Mr. JEsquiveloffered "in-person" vs "virtual" encounter. He indicated preferring virtual for this encounter.   Reason COVID-19*  Social distancing based on CDC and AMA recommendations.   I contacted TPeri Jeffersonon 12/30/2020 via telephone.      I clearly identified myself as BGillis Santa MD. I verified that I was speaking with the correct person using two identifiers (Name: TISAY PERLEBERG and date of birth: 212-07-1965.  Consent I sought verbal advanced consent from TPeri Jeffersonfor virtual visit interactions. I informed Mr. JHaynesworthof possible security and privacy concerns, risks, and limitations associated with providing "not-in-person" medical evaluation and management services. I also informed Mr. JMcdaidof the availability of "in-person" appointments. Finally, I  informed him that there would be a charge for the virtual visit and that he could be  personally, fully or partially, financially responsible for it. Mr. JSpratlinexpressed understanding and agreed to proceed.   Historic Elements   Mr. TROMMIE DUNNis a 55y.o. year old, male patient evaluated today after our last contact on 11/25/2020. Mr. JWestbrook has a past medical history of Chronic back pain, Flu, and Spinal cord stimulator status (04/29/2017). He also  has a past surgical history that includes Spinal cord stimulator insertion (03/23/2017); Colonoscopy with propofol (N/A, 06/01/2017); Esophagogastroduodenoscopy (egd) with propofol (06/01/2017); polypectomy (06/01/2017); Spinal cord stimulator removal (N/A, 06/16/2017); LEFT HEART CATH AND CORONARY ANGIOGRAPHY (N/A, 12/08/2017); Posterior laminectomy / decompression lumbar spine (01/30/2015); and Femur fracture surgery (Right, 1985). Mr. JBarahas a current medication list which includes the following prescription(s): atorvastatin, meloxicam, pregabalin, and tizanidine. He  reports that he has been smoking cigarettes. He started smoking about 37 years ago. He has a 35.00 pack-year smoking history. He has never used smokeless tobacco. He reports current alcohol use. He reports that he does not use drugs. Mr. JClairmonthas No Known Allergies.   HPI  Today, he is being contacted for worsening of previously known (established) problem  Please see HPI from previous clinic visit last month, 11/25/2020 TDaionpresents today with an increase in his low back pain with radiation into his right buttock and left leg.  Of note patient has a history of L4-L5 decompression, instrumentation, fusion that was done in 2016 with Dr. JArnoldo Morale  He is status post spinal cord stimulator implant which was not beneficial and subsequently removed in 2019.  Patient states that towards the end of July when he was working at the [employer] he bent over to pick up a box that was  approximately 60 pounds noticed acute onset of left lower back pain that radiated into his left leg at that time.  He has been having increased lower extremity neuropathic pain since then and today it is worse on the right side.  He has tried a prednisone taper in the past with his PCP.  He is unable to sleep and states that his pain has been worsening.  He has tried physical therapy in the past and is fairly active at work as well although he states that he is limited in his ability to do any work activities and is considering transitioning to more of a desk based routine at his job.  He is currently on gabapentin which was increased to 600 mg nightly.  He states that it was initially providing him with pain relief but no longer.  He denies having tried Lyrica.  Previous lumbar MRI was greater than 2 years ago.  Given acute onset of lumbar radicular pain with associated weakness that has been worsening over the last 2 weeks, recommend repeat lumbar MRI with and without contrast, epidural steroid injection as well as initiation of Lyrica as below.  We also had a discussion about spinal cord stimulation.  Of note patient does utilize [facility] and states that it is somewhat helpful for his pain.  For this reason, we will avoid any opioid analgesics.   Patient is endorsing increased low back pain with radiation into his right buttock, right posterior lateral thigh.  He states that this last week and was very difficult in regards to managing his pain.  He feels that he has another disc herniation.  At his last clinic visit on 11/25/2020 I ordered a repeat lumbar MRI and recommend that he consider a lumbar epidural steroid injection.  Neither of these have been done.  I inquired why he states that he is awaiting approval from Medicaid.  I will reach out to our front desk staff to see if we have an answer from [facility] regarding approval of lumbar MRI and lumbar epidural steroid injection for lumbar radicular pain  flare.  Laboratory Chemistry Profile   Renal Lab Results  Component Value Date   BUN 14 11/12/2020   CREATININE 1.02 84/66/5993   BCR NOT APPLICABLE 57/04/7791   GFRAA 118 01/02/2018   GFRNONAA 102 01/02/2018    Hepatic Lab Results  Component Value Date   AST 20 11/12/2020   ALT 14 11/12/2020   ALBUMIN 4.4 12/06/2017   ALKPHOS 64 12/06/2017   LIPASE 41 12/06/2017    Electrolytes Lab Results  Component Value Date   NA 141 11/12/2020   K 4.0 11/12/2020   CL 107 11/12/2020   CALCIUM 9.6 11/12/2020    Bone No results found for: VD25OH, VD125OH2TOT, JQ3009QZ3, AQ7622QJ3, 25OHVITD1, 25OHVITD2, 25OHVITD3, TESTOFREE, TESTOSTERONE  Inflammation (CRP: Acute Phase) (ESR: Chronic Phase) No results found for: CRP, ESRSEDRATE, LATICACIDVEN       Note: Above Lab results reviewed.  Imaging  CT Head Wo Contrast CLINICAL DATA:  Sinus pressure and headache for the past 5 days. The pain is behind the right eye and worsening.  EXAM: CT HEAD WITHOUT CONTRAST  TECHNIQUE: Contiguous axial images were obtained from the base of the skull through the vertex without intravenous contrast.  COMPARISON:  None.  FINDINGS: Brain: Normal appearing cerebral hemispheres and posterior fossa structures. Normal size and position of the ventricles. No intracranial hemorrhage, mass lesion or CT evidence of acute infarction.  Vascular: No hyperdense vessel or unexpected calcification.  Skull: Normal. Negative for fracture or focal lesion.  Sinuses/Orbits: Completely opacified right maxillary sinus with diffuse wall thickening and erosion through the medial wall of the sinus, into the nasal cavity. There is also marked mucosal thickening in the right ethmoid and frontal sinuses. 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 Unremarkable orbits.  Other: None.  IMPRESSION: 1. No intracranial abnormality. 2. 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 3. 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  Electronically Signed   By: Claudie Revering M.D.   On: 04/06/2019 10:22  Assessment  The primary encounter diagnosis was Lumbar radiculopathy (R>L). 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  Plan of Care   Recommend lumbar MRI and lumbar epidural steroid injection.  Please see last clinic note.  Unclear as to why his MRI and epidural have not been done yet.  We will see if Medicaid has approved this yet.  Follow-up plan:   Return for After MRI for lumbar epidural steroid injection.    Recent Visits Date Type Provider Dept  11/25/20 Office Visit Gillis Santa, MD [facility]  Showing recent visits within past 90 days and meeting all other requirements Today's Visits Date Type Provider Dept  12/30/20 Office Visit Gillis Santa, MD [facility]  Showing today's visits and meeting all other requirements Future Appointments No visits were found meeting these conditions. Showing future appointments within next 90 days and meeting all other requirements I discussed the assessment and treatment plan with the patient. The patient was provided an opportunity to ask questions and all were answered. The patient agreed with the plan and demonstrated an understanding of the instructions.  Patient advised to call back or seek an in-person evaluation if the symptoms or condition worsens.  Duration of encounter: 57mnutes.  Note by: BGillis Santa MD Date: 12/30/2020; Time: 3:46 PM

## 2020-12-31 ENCOUNTER — Other Ambulatory Visit: Payer: Self-pay

## 2020-12-31 ENCOUNTER — Telehealth: Payer: Self-pay

## 2020-12-31 ENCOUNTER — Other Ambulatory Visit: Payer: Self-pay | Admitting: Student in an Organized Health Care Education/Training Program

## 2020-12-31 DIAGNOSIS — M961 Postlaminectomy syndrome, not elsewhere classified: Secondary | ICD-10-CM

## 2020-12-31 DIAGNOSIS — M5416 Radiculopathy, lumbar region: Secondary | ICD-10-CM

## 2020-12-31 DIAGNOSIS — Z981 Arthrodesis status: Secondary | ICD-10-CM

## 2020-12-31 DIAGNOSIS — G8929 Other chronic pain: Secondary | ICD-10-CM

## 2020-12-31 DIAGNOSIS — M5136 Other intervertebral disc degeneration, lumbar region: Secondary | ICD-10-CM

## 2020-12-31 NOTE — Telephone Encounter (Signed)
Pt needs refill on Tizanidine and pregabalin to [facility]

## 2021-01-01 ENCOUNTER — Other Ambulatory Visit: Payer: Self-pay

## 2021-01-01 DIAGNOSIS — G8929 Other chronic pain: Secondary | ICD-10-CM

## 2021-01-09 ENCOUNTER — Ambulatory Visit
Admission: RE | Admit: 2021-01-09 | Discharge: 2021-01-09 | Disposition: A | Discharge status: Home / Self Care | Payer: BC Managed Care – PPO | Source: Ambulatory Visit | Attending: Student in an Organized Health Care Education/Training Program | Admitting: Student in an Organized Health Care Education/Training Program

## 2021-01-09 ENCOUNTER — Other Ambulatory Visit: Payer: Self-pay | Admitting: Student in an Organized Health Care Education/Training Program

## 2021-01-09 ENCOUNTER — Other Ambulatory Visit: Payer: Self-pay

## 2021-01-09 DIAGNOSIS — M5136 Other intervertebral disc degeneration, lumbar region: Secondary | ICD-10-CM

## 2021-01-09 DIAGNOSIS — M961 Postlaminectomy syndrome, not elsewhere classified: Secondary | ICD-10-CM | POA: Diagnosis present

## 2021-01-09 DIAGNOSIS — M5416 Radiculopathy, lumbar region: Secondary | ICD-10-CM | POA: Diagnosis present

## 2021-01-09 DIAGNOSIS — Z981 Arthrodesis status: Secondary | ICD-10-CM | POA: Diagnosis present

## 2021-01-09 MED ORDER — GADOBUTROL 1 MMOL/ML IV SOLN: 7.5000 mL | Freq: Once | INTRAVENOUS | Status: DC | PRN

## 2021-01-12 ENCOUNTER — Other Ambulatory Visit: Payer: Self-pay | Admitting: Student in an Organized Health Care Education/Training Program

## 2021-01-12 DIAGNOSIS — M5416 Radiculopathy, lumbar region: Secondary | ICD-10-CM

## 2021-01-12 DIAGNOSIS — M961 Postlaminectomy syndrome, not elsewhere classified: Secondary | ICD-10-CM

## 2021-01-12 DIAGNOSIS — Z981 Arthrodesis status: Secondary | ICD-10-CM

## 2021-01-12 NOTE — Progress Notes (Signed)
Called patient, he is unable to talk right now, will call back later.

## 2021-01-12 NOTE — Progress Notes (Signed)
MRI results read to patient. Agrees to do LESI with PO Valium. Pre procedure instructions given.  Please schedule/get PA for LESI.

## 2021-01-24 DIAGNOSIS — Z419 Encounter for procedure for purposes other than remedying health state, unspecified: Secondary | ICD-10-CM | POA: Diagnosis not present

## 2021-01-28 ENCOUNTER — Encounter: Payer: Self-pay | Admitting: Student in an Organized Health Care Education/Training Program

## 2021-01-28 ENCOUNTER — Other Ambulatory Visit: Payer: Self-pay

## 2021-01-28 ENCOUNTER — Ambulatory Visit (HOSPITAL_BASED_OUTPATIENT_CLINIC_OR_DEPARTMENT_OTHER): Payer: BC Managed Care – PPO | Admitting: Student in an Organized Health Care Education/Training Program

## 2021-01-28 ENCOUNTER — Ambulatory Visit
Admission: RE | Admit: 2021-01-28 | Discharge: 2021-01-28 | Disposition: A | Discharge status: Home / Self Care | Payer: BC Managed Care – PPO | Source: Ambulatory Visit | Attending: Student in an Organized Health Care Education/Training Program | Admitting: Student in an Organized Health Care Education/Training Program

## 2021-01-28 DIAGNOSIS — M5441 Lumbago with sciatica, right side: Secondary | ICD-10-CM | POA: Diagnosis present

## 2021-01-28 DIAGNOSIS — M5416 Radiculopathy, lumbar region: Secondary | ICD-10-CM | POA: Diagnosis present

## 2021-01-28 DIAGNOSIS — G894 Chronic pain syndrome: Secondary | ICD-10-CM | POA: Insufficient documentation

## 2021-01-28 DIAGNOSIS — Z981 Arthrodesis status: Secondary | ICD-10-CM | POA: Diagnosis present

## 2021-01-28 DIAGNOSIS — G8929 Other chronic pain: Secondary | ICD-10-CM | POA: Insufficient documentation

## 2021-01-28 DIAGNOSIS — M5442 Lumbago with sciatica, left side: Secondary | ICD-10-CM | POA: Insufficient documentation

## 2021-01-28 DIAGNOSIS — M961 Postlaminectomy syndrome, not elsewhere classified: Secondary | ICD-10-CM | POA: Insufficient documentation

## 2021-01-28 MED ORDER — IOPAMIDOL (ISOVUE-M 200) INJECTION 41%: 10.0000 mL | Freq: Once | INTRAMUSCULAR | Status: DC

## 2021-01-28 MED ORDER — DEXAMETHASONE SODIUM PHOSPHATE 10 MG/ML IJ SOLN
10.0000 mg | Freq: Once | INTRAMUSCULAR | Status: AC
  Administered 2021-01-28: 10 mg

## 2021-01-28 MED ORDER — SODIUM CHLORIDE (PF) 0.9 % IJ SOLN
INTRAMUSCULAR | Status: AC
  Filled 2021-01-28: qty 10

## 2021-01-28 MED ORDER — SODIUM CHLORIDE 0.9% FLUSH
2.0000 mL | Freq: Once | INTRAVENOUS | Status: AC
  Administered 2021-01-28: 2 mL

## 2021-01-28 MED ORDER — LIDOCAINE HCL 2 % IJ SOLN
INTRAMUSCULAR | Status: AC
  Filled 2021-01-28: qty 20

## 2021-01-28 MED ORDER — LIDOCAINE HCL 2 % IJ SOLN
20.0000 mL | Freq: Once | INTRAMUSCULAR | Status: AC
  Administered 2021-01-28: 400 mg

## 2021-01-28 MED ORDER — DEXAMETHASONE SODIUM PHOSPHATE 10 MG/ML IJ SOLN
INTRAMUSCULAR | Status: AC
  Filled 2021-01-28: qty 1

## 2021-01-28 MED ORDER — ROPIVACAINE HCL 2 MG/ML IJ SOLN
2.0000 mL | Freq: Once | INTRAMUSCULAR | Status: AC
  Administered 2021-01-28: 2 mL via EPIDURAL

## 2021-01-28 MED ORDER — ROPIVACAINE HCL 2 MG/ML IJ SOLN
INTRAMUSCULAR | Status: AC
  Filled 2021-01-28: qty 20

## 2021-01-28 MED ORDER — DIAZEPAM 5 MG PO TABS
5.0000 mg | ORAL_TABLET | ORAL | Status: AC
  Administered 2021-01-28: 5 mg via ORAL

## 2021-01-28 MED ORDER — DIAZEPAM 5 MG PO TABS
ORAL_TABLET | ORAL | Status: AC
  Filled 2021-01-28: qty 1

## 2021-01-28 NOTE — Progress Notes (Signed)
Safety precautions to be maintained throughout the outpatient stay will include: orient to surroundings, keep bed in low position, maintain call bell within reach at all times, provide assistance with transfer out of bed and ambulation.  

## 2021-01-28 NOTE — Progress Notes (Signed)
PROVIDER NOTE: Information contained herein reflects review and annotations entered in association with encounter. Interpretation of such information and data should be left to medically-trained personnel. Information provided to patient can be located elsewhere in the medical record under "Patient Instructions". Document created using STT-dictation technology, any transcriptional errors that may result from process are unintentional.    Patient: Wesley Adams  Service Category: Procedure  Provider: Gillis Santa, MD  DOB: 17-Apr-1966  DOS: 01/28/2021  Location: [facility]  MRN: [id number]  Setting: Ambulatory - outpatient  Referring Provider: Gillis Santa, MD  Type: Established Patient  Specialty: Interventional Pain Management  PCP: Steele Sizer, MD   Primary Reason for Visit: Interventional Pain Management Treatment. CC: Back Pain (lower)   Procedure:          Anesthesia, Analgesia, Anxiolysis:  Type: Therapeutic Inter-Laminar Epidural Steroid Injection           Region: Lumbar Level: L2-3 Level. Laterality: Right-Sided         Type: Local Anesthesia & PO Valium 5 mg Local Anesthetic: Lidocaine 1-2% Sedation: Minimal Anxiolysis  Indication(s): Anxiety & Analgesia    Position: Prone with head of the table was raised to facilitate breathing.   Indications: 1. Lumbar radiculopathy   2. 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 Failed back surgical syndrome    Pain Score: Pre-procedure: 10-Worst pain ever/10 Post-procedure: 7/10    Pre-op H&P Assessment:  Wesley Adams is a 55 y.o. (year old), male patient, seen today for interventional treatment. He  has a past surgical history that includes Spinal cord stimulator insertion (03/23/2017); Colonoscopy with propofol (N/A, 06/01/2017); Esophagogastroduodenoscopy (egd) with propofol (06/01/2017); polypectomy (06/01/2017); Spinal cord stimulator removal (N/A, 06/16/2017); LEFT HEART CATH AND CORONARY ANGIOGRAPHY (N/A,  12/08/2017); Posterior laminectomy / decompression lumbar spine (01/30/2015); and Femur fracture surgery (Right, 1985). Wesley Adams has a current medication list which includes the following prescription(s): atorvastatin, meloxicam, pregabalin, and tizanidine. His primarily concern today is the Back Pain (lower)  Initial Vital Signs:  Pulse/HCG Rate: 64ECG Heart Rate: 80 Temp: 97.8 F (36.6 C) Resp: 16 BP: 126/64 SpO2: 100 %  BMI: Estimated body mass index is 24.67 kg/m as calculated from the following:   Height as of this encounter: 6\' 1"  (1.854 m).   Weight as of this encounter: 187 lb (84.8 kg).  Risk Assessment: Allergies: Reviewed. He has No Known Allergies.  Allergy Precautions: None required Coagulopathies: Reviewed. None identified.  Blood-thinner therapy: None at this time Active Infection(s): Reviewed. None identified. Wesley Adams is afebrile  Site Confirmation: Wesley Adams was asked to confirm the procedure and laterality before marking the site Procedure checklist: Completed Consent: Before the procedure and under the influence of no sedative(s), amnesic(s), or anxiolytics, the patient was informed of the treatment options, risks and possible complications. To fulfill our ethical and legal obligations, as recommended by the American Medical Association's Code of Ethics, I have informed the patient of my clinical impression; the nature and purpose of the treatment or procedure; the risks, benefits, and possible complications of the intervention; the alternatives, including doing nothing; the risk(s) and benefit(s) of the alternative treatment(s) or procedure(s); and the risk(s) and benefit(s) of doing nothing. The patient was provided information about the general risks and possible complications associated with the procedure. These may include, but are not limited to: failure to achieve desired goals, infection, bleeding, organ or nerve damage, allergic reactions, paralysis,  and death. In addition, the patient was informed of those risks and  complications associated to Spine-related procedures, such as failure to decrease pain; infection (i.e.: Meningitis, epidural or intraspinal abscess); bleeding (i.e.: epidural hematoma, subarachnoid hemorrhage, or any other type of intraspinal or peri-dural bleeding); organ or nerve damage (i.e.: Any type of peripheral nerve, nerve root, or spinal cord injury) with subsequent damage to sensory, motor, and/or autonomic systems, resulting in permanent pain, numbness, and/or weakness of one or several areas of the body; allergic reactions; (i.e.: anaphylactic reaction); and/or death. Furthermore, the patient was informed of those risks and complications associated with the medications. These include, but are not limited to: allergic reactions (i.e.: anaphylactic or anaphylactoid reaction(s)); adrenal axis suppression; blood sugar elevation that in diabetics may result in ketoacidosis or comma; water retention that in patients with history of congestive heart failure may result in shortness of breath, pulmonary edema, and decompensation with resultant heart failure; weight gain; swelling or edema; medication-induced neural toxicity; particulate matter embolism and blood vessel occlusion with resultant organ, and/or nervous system infarction; and/or aseptic necrosis of one or more joints. Finally, the patient was informed that Medicine is not an exact science; therefore, there is also the possibility of unforeseen or unpredictable risks and/or possible complications that may result in a catastrophic outcome. The patient indicated having understood very clearly. We have given the patient no guarantees and we have made no promises. Enough time was given to the patient to ask questions, all of which were answered to the patient's satisfaction. Wesley Adams has indicated that he wanted to continue with the procedure. Attestation: I, the ordering  provider, attest that I have discussed with the patient the benefits, risks, side-effects, alternatives, likelihood of achieving goals, and potential problems during recovery for the procedure that I have provided informed consent. Date  Time: 01/28/2021 10:29 AM  Pre-Procedure Preparation:  Monitoring: As per clinic protocol. Respiration, ETCO2, SpO2, BP, heart rate and rhythm monitor placed and checked for adequate function Safety Precautions: Patient was assessed for positional comfort and pressure points before starting the procedure. Time-out: I initiated and conducted the "Time-out" before starting the procedure, as per protocol. The patient was asked to participate by confirming the accuracy of the "Time Out" information. Verification of the correct person, site, and procedure were performed and confirmed by me, the nursing staff, and the patient. "Time-out" conducted as per Joint Commission's Universal Protocol (UP.01.01.01). Time: 1102  Description of Procedure:          Target Area: The interlaminar space, initially targeting the lower laminar border of the superior vertebral body. Approach: Paramedial approach. Area Prepped: Entire Posterior Lumbar Region DuraPrep (Iodine Povacrylex [0.7% available iodine] and Isopropyl Alcohol, 74% w/w) Safety Precautions: Aspiration looking for blood return was conducted prior to all injections. At no point did we inject any substances, as a needle was being advanced. No attempts were made at seeking any paresthesias. Safe injection practices and needle disposal techniques used. Medications properly checked for expiration dates. SDV (single dose vial) medications used. Description of the Procedure: Protocol guidelines were followed. The procedure needle was introduced through the skin, ipsilateral to the reported pain, and advanced to the target area. Bone was contacted and the needle walked caudad, until the lamina was cleared. The epidural space was  identified using "loss-of-resistance technique" with 2-3 ml of PF-NaCl (0.9% NSS), in a 5cc LOR glass syringe.  Vitals:   01/28/21 1037 01/28/21 1100 01/28/21 1105 01/28/21 1109  BP: 126/64 (!) 129/56 (!) 132/55 130/60  Pulse: 64     Resp: 16 16  18 16  Temp: 97.8 F (36.6 C)     TempSrc: Temporal     SpO2: 100% 97% 97% 96%  Weight: 187 lb (84.8 kg)     Height: 6\' 1"  (1.854 m)       Start Time: 1102 hrs. End Time: 1109 hrs.  Materials:  Needle(s) Type: Epidural needle Gauge: 22G Length: 3.5-in Medication(s): Please see orders for medications and dosing details. 6cc solution made of 3cc of preservative-free saline, 2 cc of 0.2% ropivacaine, 1 cc of Decadron 10 mg/cc.  Imaging Guidance (Spinal):          Type of Imaging Technique: Fluoroscopy Guidance (Spinal) Indication(s): Assistance in needle guidance and placement for procedures requiring needle placement in or near specific anatomical locations not easily accessible without such assistance. Exposure Time: Please see nurses notes. Contrast: Before injecting any contrast, we confirmed that the patient did not have an allergy to iodine, shellfish, or radiological contrast. Once satisfactory needle placement was completed at the desired level, radiological contrast was injected. Contrast injected under live fluoroscopy. No contrast complications. See chart for type and volume of contrast used. Fluoroscopic Guidance: I was personally present during the use of fluoroscopy. "Tunnel Vision Technique" used to obtain the best possible view of the target area. Parallax error corrected before commencing the procedure. "Direction-depth-direction" technique used to introduce the needle under continuous pulsed fluoroscopy. Once target was reached, antero-posterior, oblique, and lateral fluoroscopic projection used confirm needle placement in all planes. Images permanently stored in EMR. Interpretation: I personally interpreted the imaging  intraoperatively. Adequate needle placement confirmed in multiple planes. Appropriate spread of contrast into desired area was observed. No evidence of afferent or efferent intravascular uptake. No intrathecal or subarachnoid spread observed. Permanent images saved into the patient's record.   Post-operative Assessment:  Post-procedure Vital Signs:  Pulse/HCG Rate: 6477 Temp: 97.8 F (36.6 C) Resp: 16 BP: 130/60 SpO2: 96 %  EBL: None  Complications: No immediate post-treatment complications observed by team, or reported by patient.  Note: The patient tolerated the entire procedure well. A repeat set of vitals were taken after the procedure and the patient was kept under observation following institutional policy, for this type of procedure. Post-procedural neurological assessment was performed, showing return to baseline, prior to discharge. The patient was provided with post-procedure discharge instructions, including a section on how to identify potential problems. Should any problems arise concerning this procedure, the patient was given instructions to immediately contact us, at any time, without hesitation. In any case, we plan to contact the patient by telephone for a follow-up status report regarding this interventional procedure.  Comments:  No additional relevant information.  Plan of Care  Orders:  Orders Placed This Encounter  Procedures   DG PAIN [facility] C-ARM 1-60 MIN NO REPORT    Intraoperative interpretation by procedural physician at [facility] [place].    Standing Status:   Standing    Number of Occurrences:   1    Order Specific Question:   Reason for exam:    Answer:   Assistance in needle guidance and placement for procedures requiring needle placement in or near specific anatomical locations not easily accessible without such assistance.   5 out of 5 strength bilateral lower extremity: Plantar flexion, dorsiflexion, knee flexion, knee extension.   Medications  ordered for procedure: Meds ordered this encounter  Medications   lidocaine (XYLOCAINE) 2 % (with pres) injection 400 mg   diazepam (VALIUM) tablet 5 mg    Make sure Flumazenil  is available in the pyxis when using this medication. If oversedation occurs, administer 0.2 mg IV over 15 sec. If after 45 sec no response, administer 0.2 mg again over 1 min; may repeat at 1 min intervals; not to exceed 4 doses (1 mg)   ropivacaine (PF) 2 mg/mL (0.2%) (NAROPIN) injection 2 mL   sodium chloride flush (NS) 0.9 % injection 2 mL   dexamethasone (DECADRON) injection 10 mg   Medications administered: We administered lidocaine, diazepam, ropivacaine (PF) 2 mg/mL (0.2%), sodium chloride flush, and dexamethasone.  See the medical record for exact dosing, route, and time of administration.  Follow-up plan:   Return in about 5 weeks (around 03/04/2021) for Post Procedure Evaluation, virtual.      Recent Visits Date Type Provider Dept  12/30/20 Office Visit Gillis Santa, MD [facility]  11/25/20 Office Visit Gillis Santa, MD [facility]  Showing recent visits within past 90 days and meeting all other requirements Today's Visits Date Type Provider Dept  01/28/21 Procedure visit Gillis Santa, MD [facility]  Showing today's visits and meeting all other requirements Future Appointments Date Type Provider Dept  03/04/21 Appointment Gillis Santa, MD [facility]  Showing future appointments within next 90 days and meeting all other requirements Disposition: Discharge home  Discharge (Date  Time): 01/28/2021; 1120 hrs.   Primary Care Physician: Steele Sizer, MD Location: [facility] Note by: Gillis Santa, MD Date: 01/28/2021; Time: 11:43 AM  Disclaimer:  Medicine is not an exact science. The only guarantee in medicine is that nothing is guaranteed. It is important to note that the decision to proceed with this intervention was  based on the information collected from the patient. The Data and conclusions were drawn from the patient's questionnaire, the interview, and the physical examination. Because the information was provided in large part by the patient, it cannot be guaranteed that it has not been purposely or unconsciously manipulated. Every effort has been made to obtain as much relevant data as possible for this evaluation. It is important to note that the conclusions that lead to this procedure are derived in large part from the available data. Always take into account that the treatment will also be dependent on availability of resources and existing treatment guidelines, considered by other Pain Management Practitioners as being common knowledge and practice, at the time of the intervention. For Medico-Legal purposes, it is also important to point out that variation in procedural techniques and pharmacological choices are the acceptable norm. The indications, contraindications, technique, and results of the above procedure should only be interpreted and judged by a Board-Certified Interventional Pain Specialist with extensive familiarity and expertise in the same exact procedure and technique.

## 2021-01-28 NOTE — Patient Instructions (Signed)
Pain Management Discharge Instructions  General Discharge Instructions :  If you need to reach your doctor call: Monday-Friday 8:00 am - 4:00 pm at [phone] or toll free [phone].  After clinic hours [phone] to have operator reach doctor.  Bring all of your medication bottles to all your appointments in the pain clinic.  To cancel or reschedule your appointment with Pain Management please remember to call 24 hours in advance to avoid a fee.  Refer to the educational materials which you have been given on: General Risks, I had my Procedure. Discharge Instructions, Post Sedation.  Post Procedure Instructions:  The drugs you were given will stay in your system until tomorrow, so for the next 24 hours you should not drive, make any legal decisions or drink any alcoholic beverages.  You may eat anything you prefer, but it is better to start with liquids then soups and crackers, and gradually work up to solid foods.  Please notify your doctor immediately if you have any unusual bleeding, trouble breathing or pain that is not related to your normal pain.  Depending on the type of procedure that was done, some parts of your body may feel week and/or numb.  This usually clears up by tonight or the next day.  Walk with the use of an assistive device or accompanied by an adult for the 24 hours.  You may use ice on the affected area for the first 24 hours.  Put ice in a Ziploc bag and cover with a towel and place against area 15 minutes on 15 minutes off.  You may switch to heat after 24 hours.Epidural Steroid Injection Patient Information  Description: The epidural space surrounds the nerves as they exit the spinal cord.  In some patients, the nerves can be compressed and inflamed by a bulging disc or a tight spinal canal (spinal stenosis).  By injecting steroids into the epidural space, we can bring irritated nerves into direct contact with a potentially helpful medication.  These  steroids act directly on the irritated nerves and can reduce swelling and inflammation which often leads to decreased pain.  Epidural steroids may be injected anywhere along the spine and from the neck to the low back depending upon the location of your pain.   After numbing the skin with local anesthetic (like Novocaine), a small needle is passed into the epidural space slowly.  You may experience a sensation of pressure while this is being done.  The entire block usually last less than 10 minutes.  Conditions which may be treated by epidural steroids:  Low back and leg pain Neck and arm pain Spinal stenosis Post-laminectomy syndrome Herpes zoster (shingles) pain Pain from compression fractures  Preparation for the injection:  Do not eat any solid food or dairy products within 8 hours of your appointment.  You may drink clear liquids up to 3 hours before appointment.  Clear liquids include water, black coffee, juice or soda.  No milk or cream please. You may take your regular medication, including pain medications, with a sip of water before your appointment  Diabetics should hold regular insulin (if taken separately) and take 1/2 normal NPH dos the morning of the procedure.  Carry some sugar containing items with you to your appointment. A driver must accompany you and be prepared to drive you home after your procedure.  Bring all your current medications with your. An IV may be inserted and sedation may be given at the discretion of the physician.     A blood pressure cuff, EKG and other monitors will often be applied during the procedure.  Some patients may need to have extra oxygen administered for a short period. You will be asked to provide medical information, including your allergies, prior to the procedure.  We must know immediately if you are taking blood thinners (like Coumadin/Warfarin)  Or if you are allergic to IV iodine contrast (dye). We must know if you could possible be  pregnant.  Possible side-effects: Bleeding from needle site Infection (rare, may require surgery) Nerve injury (rare) Numbness & tingling (temporary) Difficulty urinating (rare, temporary) Spinal headache ( a headache worse with upright posture) Light -headedness (temporary) Pain at injection site (several days) Decreased blood pressure (temporary) Weakness in arm/leg (temporary) Pressure sensation in back/neck (temporary)  Call if you experience: Fever/chills associated with headache or increased back/neck pain. Headache worsened by an upright position. New onset weakness or numbness of an extremity below the injection site Hives or difficulty breathing (go to the emergency room) Inflammation or drainage at the infection site Severe back/neck pain Any new symptoms which are concerning to you  Please note:  Although the local anesthetic injected can often make your back or neck feel good for several hours after the injection, the pain will likely return.  It takes 3-7 days for steroids to work in the epidural space.  You may not notice any pain relief for at least that one week.  If effective, we will often do a series of three injections spaced 3-6 weeks apart to maximally decrease your pain.  After the initial series, we generally will wait several months before considering a repeat injection of the same type.  If you have any questions, please call [phone] [facility] 

## 2021-01-28 NOTE — Addendum Note (Signed)
Addended by: Gillis Santa on: 01/28/2021 11:48 AM   Modules accepted: Orders

## 2021-01-29 ENCOUNTER — Telehealth: Payer: Self-pay | Admitting: *Deleted

## 2021-01-29 ENCOUNTER — Telehealth: Payer: Self-pay

## 2021-01-29 NOTE — Telephone Encounter (Signed)
Voicemail left with patient re; request for neurology appt.  Asked him to please return the call.

## 2021-01-29 NOTE — Telephone Encounter (Signed)
Pt called a d asked if he could get a referral sent over to [facility]

## 2021-01-29 NOTE — Telephone Encounter (Signed)
Called for post procedure check. No issues but states he would like to be referred to NS as discussed with Dr. Holley Raring at his appointment yesterday. Patient to call us back with his NS of choice so that we can make the referral.

## 2021-02-02 ENCOUNTER — Telehealth: Payer: Self-pay

## 2021-02-02 NOTE — Telephone Encounter (Signed)
Called patient and stated that he needed to call Medical Records and request his MRI. I also told him that sometimes office could see the MRI. Patient with understanding.

## 2021-02-02 NOTE — Telephone Encounter (Signed)
Pt has an appt with [facility] on Wednesday with Dr.Cutler.. Is requesting a copy of MRI to take to the appt. Is able to come and pick it up

## 2021-02-04 ENCOUNTER — Telehealth: Payer: Self-pay | Admitting: *Deleted

## 2021-02-04 NOTE — Telephone Encounter (Signed)
Patient went to [facility] today.  Xrays were taken and recommendations have been made.  Patient would like to discuss these fingings.  Dr Avis Epley has said that he would recommend a fusion but this will have to be performed by someone other than him.  Dr Avis Epley is wanting [place] to do another injection "in the nerves".  I told patient I would convey the information and see what is most appropriate.   Patient had epidural on 01/28/21 and this did help for a couple of days and then the pain returned.

## 2021-02-16 ENCOUNTER — Telehealth (INDEPENDENT_AMBULATORY_CARE_PROVIDER_SITE_OTHER): Payer: Self-pay

## 2021-02-16 ENCOUNTER — Encounter: Payer: Self-pay | Admitting: Student in an Organized Health Care Education/Training Program

## 2021-02-16 ENCOUNTER — Other Ambulatory Visit: Payer: Self-pay

## 2021-02-16 ENCOUNTER — Ambulatory Visit
Payer: BC Managed Care – PPO | Attending: Student in an Organized Health Care Education/Training Program | Admitting: Student in an Organized Health Care Education/Training Program

## 2021-02-16 ENCOUNTER — Telehealth: Payer: Self-pay

## 2021-02-16 ENCOUNTER — Telehealth: Payer: BC Managed Care – PPO | Admitting: Student in an Organized Health Care Education/Training Program

## 2021-02-16 DIAGNOSIS — M961 Postlaminectomy syndrome, not elsewhere classified: Secondary | ICD-10-CM

## 2021-02-16 DIAGNOSIS — G894 Chronic pain syndrome: Secondary | ICD-10-CM

## 2021-02-16 DIAGNOSIS — Z981 Arthrodesis status: Secondary | ICD-10-CM

## 2021-02-16 DIAGNOSIS — M5416 Radiculopathy, lumbar region: Secondary | ICD-10-CM | POA: Diagnosis not present

## 2021-02-16 NOTE — Progress Notes (Signed)
Patient: Wesley Adams  Service Category: E/M  Provider: Gillis Santa, MD  DOB: 1966/01/05  DOS: 02/16/2021  Location: Office  MRN: [id number]  Setting: Ambulatory outpatient  Referring Provider: Steele Sizer, MD  Type: Established Patient  Specialty: Interventional Pain Management  PCP: Steele Sizer, MD  Location: Remote location  Delivery: TeleHealth     Virtual Encounter - Pain Management PROVIDER NOTE: Information contained herein reflects review and annotations entered in association with encounter. Interpretation of such information and data should be left to medically-trained personnel. Information provided to patient can be located elsewhere in the medical record under "Patient Instructions". Document created using STT-dictation technology, any transcriptional errors that may result from process are unintentional.    Contact & Pharmacy Preferred: [phone] Home: [phone] (home) Mobile: [phone] (mobile) E-mail: [email or web address]  [id number] [place] - [address]: [phone]: [phone] [id number] [place] [place] [place]: [phone]: [phone]  Pre-screening  Mr. JBougheroffered "in-person" vs "virtual" encounter. He indicated preferring virtual for this encounter.   Reason COVID-19*  Social distancing based on CDC and AMA recommendations.   I contacted TPeri Jeffersonon 02/16/2021 via telephone.      I clearly identified myself as BGillis Santa MD. I verified that I was speaking with the correct person using two identifiers (Name: TORVELL CAREAGA and date of birth: 2November 17, 1967.  Consent I sought verbal advanced consent from TPeri Jeffersonfor virtual visit interactions. I informed Mr. JSiegelof possible security and privacy concerns,  risks, and limitations associated with providing "not-in-person" medical evaluation and management services. I also informed Mr. JPritchardof the availability of "in-person" appointments. Finally, I informed him that there would be a charge for the virtual visit and that he could be  personally, fully or partially, financially responsible for it. Mr. JCampionexpressed understanding and agreed to proceed.   Historic Elements   Mr. TMYKAH SHINis a 55y.o. year old, male patient evaluated today after our last contact on 01/28/2021. Mr. JNorwood has a past medical history of Chronic back pain, Flu, and Spinal cord stimulator status (04/29/2017). He also  has a past surgical history that includes Spinal cord stimulator insertion (03/23/2017); Colonoscopy with propofol (N/A, 06/01/2017); Esophagogastroduodenoscopy (egd) with propofol (06/01/2017); polypectomy (06/01/2017); Spinal cord stimulator removal (N/A, 06/16/2017); LEFT HEART CATH AND CORONARY ANGIOGRAPHY (N/A, 12/08/2017); Posterior laminectomy / decompression lumbar spine (01/30/2015); and Femur fracture surgery (Right, 1985). Mr. JWitmanhas a current medication list which includes the following prescription(s): atorvastatin, meloxicam, pregabalin, and tizanidine. He  reports that he has been smoking cigarettes. He started smoking about 37 years ago. He has a 35.00 pack-year smoking history. He has never used smokeless tobacco. He reports current alcohol use. He reports that he does not use drugs. Mr. JHamblenhas No Known Allergies.   HPI  Today, he is being contacted for worsening of previously known (established) problem  Patient is having increased right lumbar radicular pain consistent with L5 and S1 dermatome.  [facility] a right L5 and S1 transforaminal epidural steroid injection.  I discussed this with the patient.  Risks and benefits of procedure reviewed and patient would like to  proceed.  Laboratory Chemistry Profile   Renal Lab  Results  Component Value Date   BUN 14 11/12/2020   CREATININE 1.02 81/82/9937   BCR NOT APPLICABLE 16/96/7893   GFRAA 118 01/02/2018   GFRNONAA 102 01/02/2018    Hepatic Lab Results  Component Value Date   AST 20 11/12/2020   ALT 14 11/12/2020   ALBUMIN 4.4 12/06/2017   ALKPHOS 64 12/06/2017   LIPASE 41 12/06/2017    Electrolytes Lab Results  Component Value Date   NA 141 11/12/2020   K 4.0 11/12/2020   CL 107 11/12/2020   CALCIUM 9.6 11/12/2020    Bone No results found for: VD25OH, VD125OH2TOT, YB0175ZW2, HE5277OE4, 25OHVITD1, 25OHVITD2, 25OHVITD3, TESTOFREE, TESTOSTERONE  Inflammation (CRP: Acute Phase) (ESR: Chronic Phase) No results found for: CRP, ESRSEDRATE, LATICACIDVEN       Note: Above Lab results reviewed.   Assessment  The primary encounter diagnosis was Lumbar radiculopathy. 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  Plan of Care   Plan for right L5 and S1 transforaminal epidural steroid injection with p.o. Valium as recommended by neurosurgery.   Orders:  Orders Placed This Encounter  Procedures   Lumbar Transforaminal Epidural    Standing Status:   Future    Standing Expiration Date:   03/19/2021    Scheduling Instructions:     Side: RIGHT L5 and S1     Sedation: PO Valium     Timeframe: ASAP    Order Specific Question:   Where will this procedure be performed?    Answer:   [facility]   Follow-up plan:   Return in about 2 days (around 02/18/2021) for R L5 + S1 TF ESI , minimal sedation (PO Valium).    Recent Visits Date Type Provider Dept  01/28/21 Procedure visit Gillis Santa, MD [facility]  12/30/20 Office Visit Gillis Santa, MD [facility]  11/25/20 Office Visit Gillis Santa, MD [facility]  Showing recent visits within past 90 days and meeting  all other requirements Today's Visits Date Type Provider Dept  02/16/21 Office Visit Gillis Santa, MD [facility]  Showing today's visits and meeting all other requirements Future Appointments Date Type Provider Dept  03/04/21 Appointment Gillis Santa, MD [facility]  Showing future appointments within next 90 days and meeting all other requirements I discussed the assessment and treatment plan with the patient. The patient was provided an opportunity to ask questions and all were answered. The patient agreed with the plan and demonstrated an understanding of the instructions.  Patient advised to call back or seek an in-person evaluation if the symptoms or condition worsens.  Duration of encounter: 20 minutes.  Note by: Gillis Santa, MD Date: 02/16/2021; Time: 2:41 PM

## 2021-02-16 NOTE — Telephone Encounter (Signed)
LM for patient to call office for pre procedure questions.

## 2021-02-17 ENCOUNTER — Telehealth: Payer: Self-pay

## 2021-02-17 NOTE — Telephone Encounter (Signed)
Pre procedure instructions given to patient via phone.  OK to schedule.  [facility]

## 2021-02-18 ENCOUNTER — Ambulatory Visit: Payer: BC Managed Care – PPO | Attending: Neurosurgery | Admitting: Physical Therapy

## 2021-02-18 ENCOUNTER — Encounter: Payer: Self-pay | Admitting: Physical Therapy

## 2021-02-18 ENCOUNTER — Encounter: Payer: Self-pay | Admitting: Student in an Organized Health Care Education/Training Program

## 2021-02-18 ENCOUNTER — Other Ambulatory Visit: Payer: Self-pay

## 2021-02-18 ENCOUNTER — Ambulatory Visit (HOSPITAL_BASED_OUTPATIENT_CLINIC_OR_DEPARTMENT_OTHER): Payer: BC Managed Care – PPO | Admitting: Student in an Organized Health Care Education/Training Program

## 2021-02-18 ENCOUNTER — Ambulatory Visit
Admission: RE | Admit: 2021-02-18 | Discharge: 2021-02-18 | Disposition: A | Discharge status: Home / Self Care | Payer: BC Managed Care – PPO | Source: Ambulatory Visit | Attending: Student in an Organized Health Care Education/Training Program | Admitting: Student in an Organized Health Care Education/Training Program

## 2021-02-18 VITALS — BP 143/87 | HR 65 | Temp 97.0°F | Resp 20 | Ht 73.0 in | Wt 193.0 lb

## 2021-02-18 DIAGNOSIS — G894 Chronic pain syndrome: Secondary | ICD-10-CM

## 2021-02-18 DIAGNOSIS — G8929 Other chronic pain: Secondary | ICD-10-CM | POA: Diagnosis present

## 2021-02-18 DIAGNOSIS — M5416 Radiculopathy, lumbar region: Secondary | ICD-10-CM | POA: Insufficient documentation

## 2021-02-18 DIAGNOSIS — M5441 Lumbago with sciatica, right side: Secondary | ICD-10-CM | POA: Insufficient documentation

## 2021-02-18 MED ORDER — DIAZEPAM 5 MG PO TABS
ORAL_TABLET | ORAL | Status: AC
  Filled 2021-02-18: qty 1

## 2021-02-18 MED ORDER — ROPIVACAINE HCL 2 MG/ML IJ SOLN
2.0000 mL | Freq: Once | INTRAMUSCULAR | Status: AC
  Administered 2021-02-18: 20 mL via EPIDURAL

## 2021-02-18 MED ORDER — DEXAMETHASONE SODIUM PHOSPHATE 10 MG/ML IJ SOLN
INTRAMUSCULAR | Status: AC
  Filled 2021-02-18: qty 2

## 2021-02-18 MED ORDER — ROPIVACAINE HCL 2 MG/ML IJ SOLN
INTRAMUSCULAR | Status: AC
  Filled 2021-02-18: qty 20

## 2021-02-18 MED ORDER — DEXAMETHASONE SODIUM PHOSPHATE 10 MG/ML IJ SOLN
10.0000 mg | Freq: Once | INTRAMUSCULAR | Status: AC
  Administered 2021-02-18: 10 mg

## 2021-02-18 MED ORDER — LIDOCAINE HCL 2 % IJ SOLN
INTRAMUSCULAR | Status: AC
  Filled 2021-02-18: qty 20

## 2021-02-18 MED ORDER — DIAZEPAM 5 MG PO TABS
5.0000 mg | ORAL_TABLET | ORAL | Status: AC
  Administered 2021-02-18: 5 mg via ORAL

## 2021-02-18 MED ORDER — IOHEXOL 180 MG/ML  SOLN
10.0000 mL | Freq: Once | INTRAMUSCULAR | Status: AC
  Administered 2021-02-18: 10 mL via EPIDURAL
  Filled 2021-02-18: qty 20

## 2021-02-18 MED ORDER — LIDOCAINE HCL 2 % IJ SOLN
20.0000 mL | Freq: Once | INTRAMUSCULAR | Status: AC
  Administered 2021-02-18: 400 mg

## 2021-02-18 NOTE — Progress Notes (Signed)
PROVIDER NOTE: Information contained herein reflects review and annotations entered in association with encounter. Interpretation of such information and data should be left to medically-trained personnel. Information provided to patient can be located elsewhere in the medical record under "Patient Instructions". Document created using STT-dictation technology, any transcriptional errors that may result from process are unintentional.    Patient: Wesley Adams  Service Category: Procedure Provider: Gillis Santa, MD DOB: 10/30/1965 DOS: 02/18/2021 Location: [facility] MRN: [id number] Setting: Ambulatory - outpatient Referring Provider: Steele Sizer, MD Type: Established Patient Specialty: Interventional Pain Management PCP: Steele Sizer, MD  Primary Reason for Visit: Interventional Pain Management Treatment. CC: Back Pain    Procedure:          Anesthesia, Analgesia, Anxiolysis:  Type: Trans-Foraminal Epidural Steroid Injection #1  Purpose: Diagnostic/Therapeutic Region: Posterolateral Lumbosacral Target Area: The 6 o'clock position under the pedicle, on the affected side. Approach: Posterior Percutaneous Paravertebral approach. Level: L5, S1 Level Laterality: Right-Sided       Anesthesia: Local (1-2% Lidocaine)  Anxiolysis: Oral 5 mg p.o. Valium Sedation: Minimal  Guidance: Fluoroscopy           Position: Prone   Indications: 1. Lumbar radiculopathy   2. Chronic radicular lumbar pain   3. Chronic pain syndrome    Pain Score: Pre-procedure: 8 /10 Post-procedure: 7 /10     Pre-op H&P Assessment:  Wesley Adams is a 55 y.o. (year old), male patient, seen today for interventional treatment. He  has a past surgical history that includes Spinal cord stimulator insertion (03/23/2017); Colonoscopy with propofol (N/A, 06/01/2017); Esophagogastroduodenoscopy (egd) with propofol (06/01/2017); polypectomy (06/01/2017); Spinal cord stimulator removal (N/A,  06/16/2017); LEFT HEART CATH AND CORONARY ANGIOGRAPHY (N/A, 12/08/2017); Posterior laminectomy / decompression lumbar spine (01/30/2015); and Femur fracture surgery (Right, 1985). Wesley Adams has a current medication list which includes the following prescription(s): atorvastatin, celecoxib, meloxicam, pregabalin, and tizanidine. His primarily concern today is the Back Pain  Initial Vital Signs:  Pulse/HCG Rate: 65ECG Heart Rate: 90 Temp:  (!) 97 F (36.1 C) Resp: 18 BP: 135/90 SpO2: 100 %  BMI: Estimated body mass index is 25.46 kg/m as calculated from the following:   Height as of this encounter: 6\' 1"  (1.854 m).   Weight as of this encounter: 193 lb (87.5 kg).  Risk Assessment: Allergies: Reviewed. He has No Known Allergies.  Allergy Precautions: None required Coagulopathies: Reviewed. None identified.  Blood-thinner therapy: None at this time Active Infection(s): Reviewed. None identified. Wesley Adams is afebrile  Site Confirmation: Wesley Adams was asked to confirm the procedure and laterality before marking the site Procedure checklist: Completed Consent: Before the procedure and under the influence of no sedative(s), amnesic(s), or anxiolytics, the patient was informed of the treatment options, risks and possible complications. To fulfill our ethical and legal obligations, as recommended by the American Medical Association's Code of Ethics, I have informed the patient of my clinical impression; the nature and purpose of the treatment or procedure; the risks, benefits, and possible complications of the intervention; the alternatives, including doing nothing; the risk(s) and benefit(s) of the alternative treatment(s) or procedure(s); and the risk(s) and benefit(s) of doing nothing. The patient was provided information about the general risks and possible complications associated with the procedure. These may include, but are not limited to: failure to achieve desired goals,  infection, bleeding, organ or nerve damage, allergic reactions, paralysis, and death. In addition, the patient was informed of those risks and complications associated to Spine-related procedures, such  as failure to decrease pain; infection (i.e.: Meningitis, epidural or intraspinal abscess); bleeding (i.e.: epidural hematoma, subarachnoid hemorrhage, or any other type of intraspinal or Wesley-dural bleeding); organ or nerve damage (i.e.: Any type of peripheral nerve, nerve root, or spinal cord injury) with subsequent damage to sensory, motor, and/or autonomic systems, resulting in permanent pain, numbness, and/or weakness of one or several areas of the body; allergic reactions; (i.e.: anaphylactic reaction); and/or death. Furthermore, the patient was informed of those risks and complications associated with the medications. These include, but are not limited to: allergic reactions (i.e.: anaphylactic or anaphylactoid reaction(s)); adrenal axis suppression; blood sugar elevation that in diabetics may result in ketoacidosis or comma; water retention that in patients with history of congestive heart failure may result in shortness of breath, pulmonary edema, and decompensation with resultant heart failure; weight gain; swelling or edema; medication-induced neural toxicity; particulate matter embolism and blood vessel occlusion with resultant organ, and/or nervous system infarction; and/or aseptic necrosis of one or more joints. Finally, the patient was informed that Medicine is not an exact science; therefore, there is also the possibility of unforeseen or unpredictable risks and/or possible complications that may result in a catastrophic outcome. The patient indicated having understood very clearly. We have given the patient no guarantees and we have made no promises. Enough time was given to the patient to ask questions, all of which were answered to the patient's satisfaction. Wesley Adams has indicated that he  wanted to continue with the procedure. Attestation: I, the ordering provider, attest that I have discussed with the patient the benefits, risks, side-effects, alternatives, likelihood of achieving goals, and potential problems during recovery for the procedure that I have provided informed consent. Date  Time: 02/18/2021 12:48 PM  Pre-Procedure Preparation:  Monitoring: As per clinic protocol. Respiration, ETCO2, SpO2, BP, heart rate and rhythm monitor placed and checked for adequate function Safety Precautions: Patient was assessed for positional comfort and pressure points before starting the procedure. Time-out: I initiated and conducted the "Time-out" before starting the procedure, as per protocol. The patient was asked to participate by confirming the accuracy of the "Time Out" information. Verification of the correct person, site, and procedure were performed and confirmed by me, the nursing staff, and the patient. "Time-out" conducted as per Joint Commission's Universal Protocol (UP.01.01.01). Time: 1318  Description of Procedure:          Area Prepped: Entire Posterior Lumbosacral Area DuraPrep (Iodine Povacrylex [0.7% available iodine] and Isopropyl Alcohol, 74% w/w) Safety Precautions: Aspiration looking for blood return was conducted prior to all injections. At no point did we inject any substances, as a needle was being advanced. No attempts were made at seeking any paresthesias. Safe injection practices and needle disposal techniques used. Medications properly checked for expiration dates. SDV (single dose vial) medications used. Description of the Procedure: Protocol guidelines were followed. The patient was placed in position over the procedure table. The target area was identified and the area prepped in the usual manner. Skin & deeper tissues infiltrated with local anesthetic. Appropriate amount of time allowed to pass for local anesthetics to take effect. The procedure needles were  then advanced to the target area. Proper needle placement secured. Negative aspiration confirmed. Solution injected in intermittent fashion, asking for systemic symptoms every 0.5cc of injectate. The needles were then removed and the area cleansed, making sure to leave some of the prepping solution back to take advantage of its long term bactericidal properties.  Vitals:   02/18/21 1320  02/18/21 1325 02/18/21 1330 02/18/21 1334  BP: (!) 150/95 (!) 144/84 (!) 148/91 (!) 143/87  Pulse:      Resp: 20 20 20 20   Temp:      SpO2: 99% 96% 96% 95%  Weight:      Height:        Start Time: 1318 hrs. End Time: 1334 hrs.  Materials:  Needle(s) Type: Spinal Needle Gauge: 22G Length: 3.5-in Medication(s): Please see orders for medications and dosing details. 5 cc solution made of 3 cc of 0.2% ropivacaine, 2 cc of Decadron 10 mg/cc.  2.5 cc injected for the right L5 nerve, after contrast confirmation, no paresthesias noted 2.5 cc injected for the right S1 nerve, after contrast confirmation, no paresthesias noted Imaging Guidance (Spinal):          Type of Imaging Technique: Fluoroscopy Guidance (Spinal) Indication(s): Assistance in needle guidance and placement for procedures requiring needle placement in or near specific anatomical locations not easily accessible without such assistance. Exposure Time: Please see nurses notes. Contrast: Before injecting any contrast, we confirmed that the patient did not have an allergy to iodine, shellfish, or radiological contrast. Once satisfactory needle placement was completed at the desired level, radiological contrast was injected. Contrast injected under live fluoroscopy. No contrast complications. See chart for type and volume of contrast used. Fluoroscopic Guidance: I was personally present during the use of fluoroscopy. "Tunnel Vision Technique" used to obtain the best possible view of the target area. Parallax error corrected before commencing the  procedure. "Direction-depth-direction" technique used to introduce the needle under continuous pulsed fluoroscopy. Once target was reached, antero-posterior, oblique, and lateral fluoroscopic projection used confirm needle placement in all planes. Images permanently stored in EMR. Interpretation: I personally interpreted the imaging intraoperatively. Adequate needle placement confirmed in multiple planes. Appropriate spread of contrast into desired area was observed. No evidence of afferent or efferent intravascular uptake. No intrathecal or subarachnoid spread observed. Permanent images saved into the patient's record.  Post-operative Assessment:  Post-procedure Vital Signs:  Pulse/HCG Rate: 6578 Temp:  (!) 97 F (36.1 C) Resp: 20 BP: (!) 143/87 SpO2: 95 %  EBL: None  Complications: No immediate post-treatment complications observed by team, or reported by patient.  Note: The patient tolerated the entire procedure well. A repeat set of vitals were taken after the procedure and the patient was kept under observation following institutional policy, for this type of procedure. Post-procedural neurological assessment was performed, showing return to baseline, prior to discharge. The patient was provided with post-procedure discharge instructions, including a section on how to identify potential problems. Should any problems arise concerning this procedure, the patient was given instructions to immediately contact us, at any time, without hesitation. In any case, we plan to contact the patient by telephone for a follow-up status report regarding this interventional procedure.  Comments:  No additional relevant information.  Plan of Care  Orders:  Orders Placed This Encounter  Procedures   DG PAIN [facility] C-ARM 1-60 MIN NO REPORT    Intraoperative interpretation by procedural physician at [facility].    Standing Status:   Standing    Number of Occurrences:   1    Order Specific  Question:   Reason for exam:    Answer:   Assistance in needle guidance and placement for procedures requiring needle placement in or near specific anatomical locations not easily accessible without such assistance.    Medications ordered for procedure: Meds ordered this encounter  Medications  iohexol (OMNIPAQUE) 180 MG/ML injection 10 mL    Must be Myelogram-compatible. If not available, you may substitute with a water-soluble, non-ionic, hypoallergenic, myelogram-compatible radiological contrast medium.   lidocaine (XYLOCAINE) 2 % (with pres) injection 400 mg   diazepam (VALIUM) tablet 5 mg    Make sure Flumazenil is available in the pyxis when using this medication. If oversedation occurs, administer 0.2 mg IV over 15 sec. If after 45 sec no response, administer 0.2 mg again over 1 min; may repeat at 1 min intervals; not to exceed 4 doses (1 mg)   dexamethasone (DECADRON) injection 10 mg   dexamethasone (DECADRON) injection 10 mg   ropivacaine (PF) 2 mg/mL (0.2%) (NAROPIN) injection 2 mL    Medications administered: We administered iohexol, lidocaine, diazepam, dexamethasone, dexamethasone, and ropivacaine (PF) 2 mg/mL (0.2%).  See the medical record for exact dosing, route, and time of administration.  Follow-up plan:   Return in about 3 weeks (around 03/11/2021), or 3 4 weeks, for Post Procedure Evaluation, virtual.     Recent Visits Date Type Provider Dept  02/16/21 Office Visit Gillis Santa, MD [facility]  01/28/21 Procedure visit Gillis Santa, MD [facility]  12/30/20 Office Visit Gillis Santa, MD [facility]  11/25/20 Office Visit Gillis Santa, MD [facility]  Showing recent visits within past 90 days and meeting all other requirements Today's Visits Date Type Provider Dept  02/18/21 Procedure visit Gillis Santa, MD [facility]  Showing today's visits and meeting all other requirements Future Appointments Date Type  Provider Dept  03/04/21 Appointment Gillis Santa, MD [facility]  03/10/21 Appointment Gillis Santa, MD [facility]  Showing future appointments within next 90 days and meeting all other requirements Disposition: Discharge home  Discharge (Date  Time): 02/18/2021; 1340 hrs.   Primary Care Physician: Steele Sizer, MD Location: [facility] Note by: Gillis Santa, MD Date: 02/18/2021; Time: 2:45 PM  Disclaimer:  Medicine is not an exact science. The only guarantee in medicine is that nothing is guaranteed. It is important to note that the decision to proceed with this intervention was based on the information collected from the patient. The Data and conclusions were drawn from the patient's questionnaire, the interview, and the physical examination. Because the information was provided in large part by the patient, it cannot be guaranteed that it has not been purposely or unconsciously manipulated. Every effort has been made to obtain as much relevant data as possible for this evaluation. It is important to note that the conclusions that lead to this procedure are derived in large part from the available data. Always take into account that the treatment will also be dependent on availability of resources and existing treatment guidelines, considered by other Pain Management Practitioners as being common knowledge and practice, at the time of the intervention. For Medico-Legal purposes, it is also important to point out that variation in procedural techniques and pharmacological choices are the acceptable norm. The indications, contraindications, technique, and results of the above procedure should only be interpreted and judged by a Board-Certified Interventional Pain Specialist with extensive familiarity and expertise in the same exact procedure and technique.

## 2021-02-18 NOTE — Progress Notes (Signed)
Safety precautions to be maintained throughout the outpatient stay will include: orient to surroundings, keep bed in low position, maintain call bell within reach at all times, provide assistance with transfer out of bed and ambulation.  

## 2021-02-18 NOTE — Therapy (Signed)
[facility] [address] [place], [address] Phone: [phone]   Fax:  [phone]  Physical Therapy Evaluation  Patient Details  Name: Wesley Adams MRN: [id number] Date of Birth: [birth year] No data recorded  Encounter Date: 02/18/2021   PT End of Session - 02/18/21 1132     Visit Number 1    Number of Visits 17    Date for PT Re-Evaluation 04/17/21    Authorization Type 19 PT visits thru 04/25/21    Authorization - Visit Number 1    Authorization - Number of Visits 19    Progress Note Due on Visit 10    PT Start Time 0950    PT Stop Time 9833    PT Time Calculation (min) 45 min    Activity Tolerance Patient tolerated treatment well    Behavior During Therapy [facility] for tasks assessed/performed             Past Medical History:  Diagnosis Date   Chronic back pain    Flu    9/19   Spinal cord stimulator status 04/29/2017   [facility] Scientific Spinal Cord Stimulator placed by Dr. Newman Pies 03/23/2017.    Past Surgical History:  Procedure Laterality Date   COLONOSCOPY WITH PROPOFOL N/A 06/01/2017   Procedure: COLONOSCOPY WITH PROPOFOL;  Surgeon: Lin Landsman, MD;  Location: [place];  Service: Endoscopy;  Laterality: N/A;   ESOPHAGOGASTRODUODENOSCOPY (EGD) WITH PROPOFOL  06/01/2017   Procedure: ESOPHAGOGASTRODUODENOSCOPY (EGD) WITH PROPOFOL;  Surgeon: Lin Landsman, MD;  Location: [place];  Service: Endoscopy;;   [place]   he has a rod   LEFT HEART CATH AND CORONARY ANGIOGRAPHY N/A 12/08/2017   Procedure: LEFT HEART CATH AND CORONARY ANGIOGRAPHY;  Surgeon: Corey Skains, MD;  Location: [place] [facility];  Service: Cardiovascular;  Laterality: N/A;   POLYPECTOMY  06/01/2017   Procedure: POLYPECTOMY;  Surgeon: Lin Landsman, MD;  Location: [place];  Service: Endoscopy;;   POSTERIOR LAMINECTOMY /  DECOMPRESSION LUMBAR SPINE  01/30/2015   with fusion, Dr. Arnoldo Morale   SPINAL CORD STIMULATOR INSERTION  03/23/2017   Dr Newman Pies, [facility]surgery   [place] N/A 06/16/2017   Procedure: SPINAL CORD STIMULATOR REMOVAL;  Surgeon: Newman Pies, MD;  Location: [place];  Service: Neurosurgery;  Laterality: N/A;    There were no vitals filed for this visit.   2016 fusion- spine stimulator placed 2016 out since 2018  OBJECTIVE  Mental Status Patient is oriented to person, place and time.  Recent memory is intact.  Remote memory is intact.  Attention span and concentration are intact.  Expressive speech is intact.  Patient's fund of knowledge is within normal limits for educational level.   Gross Musculoskeletal Assessment Tremor: None Bulk: Normal Tone: Normal No visible step-off along spinal column   Gait R lateral flex and slight rotation throughout gait, minimal foot clearance bilat R>L with R ankle eversion for compensation. Decreased gait speed and forward trunk lean maintained throughout. Decreased terminal stance with lack of hip ext bilat R>L   Posture Lumbar lordosis: decreased with compensatory increased thoracic kyphosis- near sway back posture Iliac crest height: Increased on L d.t decreased R wt bearing Lumbar lateral shift: slight, self correctable shift, shift only d/t pt trying to offset RLE d/t pain Lower crossed syndrome (tight hip flexors and erector spinae; weak gluts and abs):  positive, standing in  slight hip flexion   AROM (degrees) R/L (all movements include overpressure unless otherwise stated) Lumbar forward flexion (65): WNL Lumbar extension (30): nearly 100% limited d/t pain- overpressure not provided d/t severe concordant pain with self attempt Lumbar lateral flexion (25): WNL  with pt reporting decreased pain with RIGHT shift, and increased stretching at R lumbar paraspinals with L LF Thoracic and Lumbar rotation  (30 degrees):  50% limited bilat, minimal pain, pt reports feels very stiff- pain with overpressure  Hip IR (0-45): approx 20d bilat with pain Hip ER (0-45): WNL Hip Flexion (0-125): WNL bilat with increased pain to R side of lumbar spine with L and R hip flex Hip Abduction (0-40): WNL Hip extension (0-15): unable to produce hip ext in prone d/t pain. In standing approx 10d on LLE, unable to demonstrate any active hip ext on RLE in standing *Indicates pain   PROM (degrees) PROM assessment limited d/t pain. All lumbar motions with overpressure, but same limitations as AROM Hip Flex PROM approx 100d bilat limited d/t pain, when performed bilat simulataneously WNL with pain reduction Hip ER/IR/Ext = AROM limited by pain to overpressure *passive hip ext in gait limited with decreased terminal stance *Indicates pain   Repeated Movements Centralization of symptoms with lumbar flex, with best centralization with combined flex + R sidebend Peripheralization with ext   Strength (out of 5) R/L 3+*/4+ Hip flexion 3+*/3+Hip ER 4-/4 Hip IR 2/2 Hip abduction unable to tolerate test position Deferred Hip adduction 2-/2- Hip extension 5 Trunk flexion 5 Trunk extension 5/5 Trunk rotation *Indicates pain Active L hip flex with concordant R sided LBP d/t increased R hip WB  Sensation Grossly intact to light touch bilateral LEs as determined by testing dermatomes L2-S2; pt does report decreased sensation to R dermatomes >L but that he can feel light touch. Proprioception and hot/cold testing deferred on this date.    Palpation Pt very tender to light palpation to R lumbar parapsinals with concordant pain sign, withdraw response. Very tender to light palpation to all R glute musculature (most to superior glute fibers) with patient reporting palpation to this area makes RLE n/t worse. No tenderness to palpation of L side, but reports any palpation to L side increases pain to concordant R lumbar  spine    Muscle Length Hamstrings: shortened bilat Patient unable to tolerate supine lying for long enough to attempt elys and thomas test, but presumed shortening of bilat hip flexors d/t inability to demonstrate hip ext and hip flex posture in standing    Passive Accessory Intervertebral Motion (PAIVM) Pt endorses reproduction of back pain with all mobilizations and touch CPA T10 4 and UPA bilaterally T10-L4. Generally hypomobile throughout    SPECIAL TESTS Lumbar Radiculopathy and Discogenic: Centralization and Peripheralization (SN 92, -LR 0.12): Positive Slump (SN 83, -LR 0.32): R: Positive L: Negative SLR (SN 92, -LR 0.29):  Crossed SLR (SP 90): L: Positive   Facet Joint: Extension-Rotation (SN 100, -LR 0.0): R: Positive L: Positive   Lumbar Foraminal Stenosis: Lumbar quadrant (SN 70): R: Negative L: Negative   Hip: FADIR/FABER/SCOUR All positive bilat Unable to truly discern if FABER/FADIR tests are positive d/t hip dysfunction of hypersensitivity of lumbar pain   Piriformis Syndrome: FAIR Test (SN 88, SP 83): unable to obtain test position for true test, likely contributing to pain d/t severe pain and increased peripheralization of symptoms down post RLE to deep palpation directly over R piriformis    Functional Tasks Lifting: deferred to next visit  Sit to stand: gowers sign to stand with increased L shift Supine to sit modI with increased time needed and patient unable to roll side to side due to increased pain (to go from supine to prone pt scoots on back to stand from mat, turns and goes into prone SLS R: 3sec L 17sec 10MWT self selected: 0.89m/s  Fastest: 0.70m/s    Ther-Ex PT reviewed the following HEP with patient with patient able to demonstrate a set of the following with min cuing for correction needed. PT educated patient on parameters of therex (how/when to inc/decrease intensity, frequency, rep/set range, stretch hold time, and purpose of therex)  with verbalized understanding.  Education on pain cycle and hypersensitivity with acceptance   Access Code: [email or web address] Forward Bending Hip Flexion with Flat Lumbar Spine - 8 x daily - 7 x weekly - 12 reps - 5sec hold Supine Double Knee to Chest - 3 x daily - 7 x weekly - 30sec hold Hooklying Lumbar Traction - 3 x daily - 7 x weekly - 6 reps - 10sec hold                         Objective measurements completed on examination: See above findings.                PT Education - 02/18/21 1009     Education Details Patient was educated on diagnosis, anatomy and pathology involved, prognosis, role of PT, and was given an HEP, demonstrating exercise with proper form following verbal and tactile cues, and was given a paper hand out to continue exercise at home. Pt was educated on and agreed to plan of care.    Person(s) Educated Patient    Methods Explanation;Demonstration;Verbal cues    Comprehension Verbalized understanding;Returned demonstration;Verbal cues required              PT Short Term Goals - 02/18/21 1155       PT SHORT TERM GOAL #1   Title Pt will be independent with HEP in order to improve strength and decrease back pain in order to improve pain-free function at home and work.    Baseline 02/18/21 HEP given    Time 4    Period Weeks    Status New               PT Long Term Goals - 02/18/21 1155       PT LONG TERM GOAL #1   Title Pt will decrease worst back pain as reported on NPRS by at least 2 points in order to demonstrate clinically significant reduction in back pain.    Baseline 02/18/21 10/10    Time 8    Period Weeks    Status New      PT LONG TERM GOAL #2   Title Patient will increase FOTO score to 60 to demonstrate predicted increase in functional mobility to complete ADLs    Baseline 02/18/21 48    Time 8    Period Weeks    Status New      PT LONG TERM GOAL #3   Title Pt will demonstrate 10MWT speed of at least  1.85m/s to demonstrate community ambulation speed    Baseline 02/18/21 0.81m/s    Time 8    Period Weeks    Status New      PT LONG TERM GOAL #4   Title Pt will demonstrate R SLS of at least 17 secs to demonstrate PLOF  of LLE static balance to demonstrate decreased fall risk    Baseline 02/18/21 R 3sec L 17sec    Time 8    Period Weeks    Status New                    Plan - 02/18/21 1134     Clinical Impression Statement Pt is a 55 year old male presenting with LBP, signs and symptoms of R sided lumbar rediculopathy following a lifting accident at work in July 2022. Patient with long standing history of chronic LBP with lumbar decompression and fusion of L4-5 (2016), spine stimulator (2016-2019), and attempted injections for pain management without avail. Impariments in decreased lumbar mobility, decreased hip mobility, decreased hip and core strength, decreased static balance, abnormal gait and posture, decreased motor control, and hypersensitivity. Activity limitations in stair/curb negotiation, static sitting, standing, lifting, squatting, laying, and sleeping; inhibiting full pariticpation in ADLs, his physically demanding job, and playing with his 4 small children. Pt will benefit from skilled PT to address aforementioned impairments to return to optimal PLOF.    Personal Factors and Comorbidities Age;Comorbidity 1;Profession;Time since onset of injury/illness/exacerbation;Past/Current Experience    Comorbidities chronic LBP    Examination-Activity Limitations Lift;Sit;Stand;Carry;Bed Mobility;Stairs;Squat;Transfers    Examination-Participation Restrictions Community Activity;Laundry;Occupation;Driving;Cleaning;Yard Work    [employer] Evolving/Moderate complexity    Clinical Decision Making Moderate    Rehab Potential Good    PT Frequency 2x / week    PT Duration 8 weeks    PT Treatment/Interventions ADLs/Self Care Home Management;Iontophoresis 4mg /ml  Dexamethasone;Ultrasound;DME Instruction;Functional mobility training;Therapeutic exercise;Joint Manipulations;Spinal Manipulations;Passive range of motion;Dry needling;Manual techniques;Electrical Stimulation;Moist Heat;Gait training;Stair training;Traction;Cryotherapy;Therapeutic activities;Balance training;Neuromuscular re-education;Patient/family education    PT Next Visit Plan stair and lifting assessment, HEP review, pain science education    PT Home Exercise Plan lumbar traction, DKTC, repeated lumbar flex + lateral flex R    Consulted and Agree with Plan of Care Patient             Patient will benefit from skilled therapeutic intervention in order to improve the following deficits and impairments:  Abnormal gait, Decreased activity tolerance, Decreased endurance, Decreased range of motion, Decreased strength, Hypomobility, Pain, Improper body mechanics, Impaired UE functional use, Postural dysfunction, Impaired tone, Impaired flexibility, Increased muscle spasms, Decreased balance, Decreased mobility, Decreased safety awareness, Difficulty walking  Visit Diagnosis: Chronic right-sided low back pain with right-sided sciatica     Problem List 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, [place],  [address] Phone: [phone]   Fax:  [phone]  Name: ELIZANDRO LAURA MRN: [id number] Date of Birth: 11/03/1965

## 2021-02-18 NOTE — Patient Instructions (Signed)

## 2021-02-19 ENCOUNTER — Telehealth: Payer: Self-pay

## 2021-02-19 NOTE — Telephone Encounter (Signed)
Called PP. Denies any needs at this time. He does states that Dr Holley Raring will fill out papers on FMLA when he brings them.

## 2021-02-20 ENCOUNTER — Encounter: Payer: Self-pay | Admitting: Physical Therapy

## 2021-02-20 ENCOUNTER — Other Ambulatory Visit: Payer: Self-pay

## 2021-02-20 ENCOUNTER — Ambulatory Visit: Payer: BC Managed Care – PPO | Admitting: Physical Therapy

## 2021-02-20 DIAGNOSIS — G8929 Other chronic pain: Secondary | ICD-10-CM | POA: Diagnosis present

## 2021-02-20 DIAGNOSIS — M5441 Lumbago with sciatica, right side: Secondary | ICD-10-CM | POA: Diagnosis not present

## 2021-02-20 NOTE — Therapy (Signed)
[facility] [address], [place], [address] Phone: [phone]   Fax:  [phone]  Physical Therapy Treatment  Patient Details  Name: Wesley Adams MRN: [id number] Date of Birth: Sep 18, 1965 No data recorded  Encounter Date: 02/20/2021   PT End of Session - 02/20/21 0936     Visit Number 2    Number of Visits 17    Date for PT Re-Evaluation 04/17/21    Authorization Type 19 PT visits thru 04/25/21    Authorization - Visit Number 2    Authorization - Number of Visits 19    Progress Note Due on Visit 10    PT Start Time 0928    PT Stop Time 1008    PT Time Calculation (min) 40 min    Activity Tolerance Patient tolerated treatment well    Behavior During Therapy [facility] for tasks assessed/performed             Past Medical History:  Diagnosis Date   Chronic back pain    Flu    9/19   Spinal cord stimulator status 04/29/2017   [facility] Scientific Spinal Cord Stimulator placed by Dr. Newman Pies 03/23/2017.    Past Surgical History:  Procedure Laterality Date   COLONOSCOPY WITH PROPOFOL N/A 06/01/2017   Procedure: COLONOSCOPY WITH PROPOFOL;  Surgeon: Lin Landsman, MD;  Location: [place];  Service: Endoscopy;  Laterality: N/A;   ESOPHAGOGASTRODUODENOSCOPY (EGD) WITH PROPOFOL  06/01/2017   Procedure: ESOPHAGOGASTRODUODENOSCOPY (EGD) WITH PROPOFOL;  Surgeon: Lin Landsman, MD;  Location: [place];  Service: Endoscopy;;   [place]   he has a rod   LEFT HEART CATH AND CORONARY ANGIOGRAPHY N/A 12/08/2017   Procedure: LEFT HEART CATH AND CORONARY ANGIOGRAPHY;  Surgeon: Corey Skains, MD;  Location: [place] [facility];  Service: Cardiovascular;  Laterality: N/A;   POLYPECTOMY  06/01/2017   Procedure: POLYPECTOMY;  Surgeon: Lin Landsman, MD;  Location: [place];  Service: Endoscopy;;   POSTERIOR LAMINECTOMY /  DECOMPRESSION LUMBAR SPINE  01/30/2015   with fusion, Dr. Arnoldo Morale   SPINAL CORD STIMULATOR INSERTION  03/23/2017   Dr Newman Pies, [facility] Neurosurgery   [place] N/A 06/16/2017   Procedure: SPINAL CORD STIMULATOR REMOVAL;  Surgeon: Newman Pies, MD;  Location: [place];  Service: Neurosurgery;  Laterality: N/A;    There were no vitals filed for this visit.   Subjective Assessment - 02/20/21 0929     Subjective Pt reports increased pain with rolling over in bed this morning. Pt reports pain in LBP 9/10  on NPRS this visit.    Pertinent History Pt is a 55 year old male presenting with chronic LBP, current episode more R sided with numbness and tingling down posterior RLE to mid calf area. Current pain began July 2019 when he went to lift 50# box while turning to the right to load a package. Pt would like surgical intervention, but is open to conservative measures. Has had 1 epidural injection, and has another today, reports he had minimal pain relief from first. 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 Pain at rest 7/10 and with aggravating events is 10/10. 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 He would like to  be able to get back to full time work, including heavy lifting. Reports his pain is better when he is walking- he can only sit and standing for any period of time, that he has horrible pain at night, especially R side, wakes up every 3 hours to pace his floors. He reports increased pain when he WB more through RLE to step off curb, negotiate steps (has 12 steps to enter/exit work) or if he is turning to the R. Notices pain relief with "hunching over" forward bending.  In addition to working, he is a father of 64 young children (20,47, and 14 year old twins) that enjoy playing football,  soccer, and he has had trouble running and playing with them. PMH L4-5 decompression with fusion and spine stimulator from 2016-2019. Pt denies N/V, B&B changes, unexplained weight fluctuation, saddle paresthesia, fever, night sweats, or unrelenting night pain at this time.    Limitations Lifting;House hold activities    How long can you sit comfortably? 103mins    How long can you stand comfortably? static less than 65mins    How long can you walk comfortably? unlimited    Patient Stated Goals Decrease pain to return to full work duties               Therex  Nu Step L1 x 5 min seat 11 for gentle LE strengthening  5XSTS: 29.72 sec with UE use on knees   Physioball fwd lumbar flex 1 x 8 reps L<>R with decreased tolerance and increase in pain   Lower trunk Rotation >L x 15 reps with 2-3 sec hold  DKTC 3 x 30 sec with PT holding over pressure  Open book L>R 3 x 30 sec with verbal cues to go within tolerance  Posterior pelvic tilts 2 x 10 reps with tactile and verbal; cueing to initiate movement                            PT Short Term Goals - 02/18/21 1155       PT SHORT TERM GOAL #1   Title Pt will be independent with HEP in order to improve strength and decrease back pain in order to improve pain-free function at home and work.    Baseline 02/18/21 HEP given    Time 4    Period Weeks    Status New               PT Long Term Goals - 02/18/21 1155       PT LONG TERM GOAL #1   Title Pt will decrease worst back pain as reported on NPRS by at least 2 points in order to demonstrate clinically significant reduction in back pain.    Baseline 02/18/21 10/10    Time 8    Period Weeks    Status New      PT LONG TERM GOAL #2   Title Patient will increase FOTO score to 60 to demonstrate predicted increase in functional mobility to complete ADLs    Baseline 02/18/21 48    Time 8    Period Weeks    Status New      PT LONG TERM GOAL #3   Title  Pt will demonstrate 10MWT speed of at least 1.33m/s to demonstrate community ambulation speed    Baseline 02/18/21 0.47m/s    Time 8    Period Weeks    Status New      PT  LONG TERM GOAL #4   Title Pt will demonstrate R SLS of at least 17 secs to demonstrate PLOF of LLE static balance to demonstrate decreased fall risk    Baseline 02/18/21 R 3sec L 17sec    Time 8    Period Weeks    Status New                   Plan - 02/20/21 6256     Clinical Impression Statement PT initiated lumbar mobility and repeated exercise this session. Pt tolerated session well with reports of decrease LBP from 9/10 to 7/10 following therapeutic exercise. Pt does continues to be hypersensitive with therex and is limited by pain. Plan to manage LBP and increase mobility while progressing to LE/core strengthening as able. Continue PT POC.    Personal Factors and Comorbidities Age;Comorbidity 1;Profession;Time since onset of injury/illness/exacerbation;Past/Current Experience    Comorbidities chronic LBP    Examination-Activity Limitations Lift;Sit;Stand;Carry;Bed Mobility;Stairs;Squat;Transfers    Examination-Participation Restrictions Community Activity;Laundry;Occupation;Driving;Cleaning;Yard Work    Clinical Decision Making Moderate    Rehab Potential Good    PT Frequency 2x / week    PT Duration 8 weeks    PT Treatment/Interventions ADLs/Self Care Home Management;Iontophoresis 4mg /ml Dexamethasone;Ultrasound;DME Instruction;Functional mobility training;Therapeutic exercise;Joint Manipulations;Spinal Manipulations;Passive range of motion;Dry needling;Manual techniques;Electrical Stimulation;Moist Heat;Gait training;Stair training;Traction;Cryotherapy;Therapeutic activities;Balance training;Neuromuscular re-education;Patient/family education    PT Next Visit Plan stair and lifting assessment, HEP review,    PT Home Exercise Plan lumbar traction, DKTC, repeated lumbar flex + lateral flex R, posterior  pelvic tilts    Consulted and Agree with Plan of Care Patient             Patient will benefit from skilled therapeutic intervention in order to improve the following deficits and impairments:  Abnormal gait, Decreased activity tolerance, Decreased endurance, Decreased range of motion, Decreased strength, Hypomobility, Pain, Improper body mechanics, Impaired UE functional use, Postural dysfunction, Impaired tone, Impaired flexibility, Increased muscle spasms, Decreased balance, Decreased mobility, Decreased safety awareness, Difficulty walking  Visit Diagnosis: Chronic right-sided low back pain with right-sided sciatica     Problem List 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   Chronic radicular lumbar pain 11/25/2020   Lumbar spondylosis 05/03/2018   Coronary artery disease involving native heart 12/12/2017   Iliac artery stenosis, left ([place]) 12/06/2017   Atherosclerosis of aorta ([place]) 12/06/2017   Chronic bilateral low back pain with bilateral sciatica 10/03/2017   Chronic pain syndrome 10/03/2017   Tobacco use 05/27/2017   Other intervertebral disc degeneration, lumbar region 01/30/2015     Durwin Reges DPT Sharion Settler, SPT  Durwin Reges, PT 02/20/2021, 11:57 AM  [facility] [address] [place], [address] Phone: [phone]   Fax:  [phone]  Name: JUNO BOZARD MRN: [id number] Date of Birth: 1965-08-28

## 2021-02-24 DIAGNOSIS — Z419 Encounter for procedure for purposes other than remedying health state, unspecified: Secondary | ICD-10-CM | POA: Diagnosis not present

## 2021-02-24 NOTE — Telephone Encounter (Signed)
Error

## 2021-02-25 ENCOUNTER — Telehealth: Payer: Self-pay | Admitting: Student in an Organized Health Care Education/Training Program

## 2021-02-25 ENCOUNTER — Encounter: Payer: Self-pay | Admitting: Physical Therapy

## 2021-02-25 ENCOUNTER — Ambulatory Visit: Payer: BC Managed Care – PPO | Attending: Neurosurgery | Admitting: Physical Therapy

## 2021-02-25 DIAGNOSIS — G8929 Other chronic pain: Secondary | ICD-10-CM

## 2021-02-25 DIAGNOSIS — M5416 Radiculopathy, lumbar region: Secondary | ICD-10-CM

## 2021-02-25 DIAGNOSIS — M5441 Lumbago with sciatica, right side: Secondary | ICD-10-CM | POA: Insufficient documentation

## 2021-02-25 DIAGNOSIS — G894 Chronic pain syndrome: Secondary | ICD-10-CM

## 2021-02-25 MED ORDER — PREDNISONE 20 MG PO TABS: ORAL_TABLET | ORAL | 0 refills | Status: AC

## 2021-02-25 NOTE — Therapy (Signed)
[facility] [address], [place], [address] Phone: [phone]   Fax:  [phone]  Physical Therapy Treatment  Patient Details  Name: Wesley Adams MRN: [id number] Date of Birth: 04/16/1966 No data recorded  Encounter Date: 02/25/2021   PT End of Session - 02/25/21 0928     Visit Number 3    Number of Visits 17    Date for PT Re-Evaluation 04/17/21    Authorization Type 19 PT visits thru 04/25/21    Authorization - Visit Number 3    Authorization - Number of Visits 19    Progress Note Due on Visit 10    PT Start Time 0930    PT Stop Time 4580    PT Time Calculation (min) 38 min    Activity Tolerance Patient tolerated treatment well    Behavior During Therapy [facility] for tasks assessed/performed             Past Medical History:  Diagnosis Date   Chronic back pain    Flu    9/19   Spinal cord stimulator status 04/29/2017   [facility] Scientific Spinal Cord Stimulator placed by Dr. Newman Pies 03/23/2017.    Past Surgical History:  Procedure Laterality Date   COLONOSCOPY WITH PROPOFOL N/A 06/01/2017   Procedure: COLONOSCOPY WITH PROPOFOL;  Surgeon: Lin Landsman, MD;  Location: [place];  Service: Endoscopy;  Laterality: N/A;   ESOPHAGOGASTRODUODENOSCOPY (EGD) WITH PROPOFOL  06/01/2017   Procedure: ESOPHAGOGASTRODUODENOSCOPY (EGD) WITH PROPOFOL;  Surgeon: Lin Landsman, MD;  Location: [place];  Service: Endoscopy;;   [place]   he has a rod   LEFT HEART CATH AND CORONARY ANGIOGRAPHY N/A 12/08/2017   Procedure: LEFT HEART CATH AND CORONARY ANGIOGRAPHY;  Surgeon: Corey Skains, MD;  Location: [place] [facility];  Service: Cardiovascular;  Laterality: N/A;   POLYPECTOMY  06/01/2017   Procedure: POLYPECTOMY;  Surgeon: Lin Landsman, MD;  Location: [place];  Service: Endoscopy;;   POSTERIOR LAMINECTOMY /  DECOMPRESSION LUMBAR SPINE  01/30/2015   with fusion, Dr. Arnoldo Morale   SPINAL CORD STIMULATOR INSERTION  03/23/2017   Dr Newman Pies, [facility] Neurosurgery   [place] N/A 06/16/2017   Procedure: SPINAL CORD STIMULATOR REMOVAL;  Surgeon: Newman Pies, MD;  Location: [place];  Service: Neurosurgery;  Laterality: N/A;    There were no vitals filed for this visit.   Subjective Assessment - 02/25/21 0934     Subjective Pt reports bein "twice as bad" compared to [place] visit.  He reports doing a lot of walking over the weekend that increase his back pain.    Pertinent History Pt is a 55 year old male presenting with chronic LBP, current episode more R sided with numbness and tingling down posterior RLE to mid calf area. Current pain began July 2019 when he went to lift 50# box while turning to the right to load a package. Pt would like surgical intervention, but is open to conservative measures. Has had 1 epidural injection, and has another today, reports he had minimal pain relief from first. 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 Pain at rest 7/10 and with aggravating events is 10/10. 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  He would like to be able to get back to full time work, including heavy lifting. Reports his pain is better when he is walking- he can only sit and standing for any period of time, that he has horrible pain at night, especially R side, wakes up every 3 hours to pace his floors. He reports increased pain when he WB more through RLE to step off curb, negotiate steps (has 12 steps to enter/exit work) or if he is turning to the R. Notices pain relief with "hunching over" forward bending.  In addition to working, he is a father of 10 young children (35,37, and 42 year old twins) that  enjoy playing football, soccer, and he has had trouble running and playing with them. PMH L4-5 decompression with fusion and spine stimulator from 2016-2019. Pt denies N/V, B&B changes, unexplained weight fluctuation, saddle paresthesia, fever, night sweats, or unrelenting night pain at this time.    Limitations Lifting;House hold activities    How long can you sit comfortably? 37mins    How long can you stand comfortably? static less than 74mins    How long can you walk comfortably? unlimited    Patient Stated Goals Decrease pain to return to full work duties              Therex   Nu Step L1 x 5 min seat 11 UE 10 for gentle LE strengthening  Lower trunk Rotation >L x 15 reps with 2-3 sec hold  DKTC 3 x 30 sec with PT holding over pressure  Open book L>R Lumbar rotation 3 x 30 sec  with improved comfort and reduction in pain   Posterior pelvic tilts attempted with tactile cueing to initiate movement; pt unable due to increased pain.  *Pt educated on graded exposure, repeated motions exercises, and importance of continuing HEP within pain tolerance to improve activity tolerance          PT Education - 02/25/21 1022     Education Details therex/form technique    Person(s) Educated Patient    Methods Explanation;Demonstration    Comprehension Verbalized understanding;Returned demonstration              PT Short Term Goals - 02/18/21 1155       PT SHORT TERM GOAL #1   Title Pt will be independent with HEP in order to improve strength and decrease back pain in order to improve pain-free function at home and work.    Baseline 02/18/21 HEP given    Time 4    Period Weeks    Status New               PT Long Term Goals - 02/18/21 1155       PT LONG TERM GOAL #1   Title Pt will decrease worst back pain as reported on NPRS by at least 2 points in order to demonstrate clinically significant reduction in back pain.    Baseline 02/18/21 10/10    Time 8     Period Weeks    Status New      PT LONG TERM GOAL #2   Title Patient will increase FOTO score to 60 to demonstrate predicted increase in functional mobility to complete ADLs    Baseline 02/18/21 48    Time 8    Period Weeks    Status New      PT LONG TERM GOAL #3   Title Pt will demonstrate 10MWT speed of at least 1.38m/s to demonstrate  community ambulation speed    Baseline 02/18/21 0.36m/s    Time 8    Period Weeks    Status New      PT LONG TERM GOAL #4   Title Pt will demonstrate R SLS of at least 17 secs to demonstrate PLOF of LLE static balance to demonstrate decreased fall risk    Baseline 02/18/21 R 3sec L 17sec    Time 8    Period Weeks    Status New                   Plan - 02/25/21 1024     Clinical Impression Statement Pt continued therex progression and pain science education this session. Pt session limited by pain but reported decreased in symtoms folowing repeated motions and gently trunk rotation exercise. Pt educated on graded exposure, repeated motions exercises, and importance of continuing HEP within pain tolerance to improve mobility and tolerance to activity. Continue PT POC as able    Personal Factors and Comorbidities Age;Comorbidity 1;Profession;Time since onset of injury/illness/exacerbation;Past/Current Experience    Comorbidities chronic LBP    Examination-Activity Limitations Lift;Sit;Stand;Carry;Bed Mobility;Stairs;Squat;Transfers    Examination-Participation Restrictions Community Activity;Laundry;Occupation;Driving;Cleaning;Yard Work    [employer] officer Evolving/Moderate complexity    Clinical Decision Making Moderate    Rehab Potential Good    PT Frequency 2x / week    PT Duration 8 weeks    PT Treatment/Interventions ADLs/Self Care Home Management;Iontophoresis 4mg /ml Dexamethasone;Ultrasound;DME Instruction;Functional mobility training;Therapeutic exercise;Joint Manipulations;Spinal Manipulations;Passive range of  motion;Dry needling;Manual techniques;Electrical Stimulation;Moist Heat;Gait training;Stair training;Traction;Cryotherapy;Therapeutic activities;Balance training;Neuromuscular re-education;Patient/family education    PT Next Visit Plan stair and lifting assessment, HEP review,    PT Home Exercise Plan lumbar traction, DKTC, repeated lumbar flex + lateral flex R, posterior pelvic tilts    Consulted and Agree with Plan of Care Patient             Patient will benefit from skilled therapeutic intervention in order to improve the following deficits and impairments:  Abnormal gait, Decreased activity tolerance, Decreased endurance, Decreased range of motion, Decreased strength, Hypomobility, Pain, Improper body mechanics, Impaired UE functional use, Postural dysfunction, Impaired tone, Impaired flexibility, Increased muscle spasms, Decreased balance, Decreased mobility, Decreased safety awareness, Difficulty walking  Visit Diagnosis: Chronic right-sided low back pain with right-sided sciatica     Problem List 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, [place], [address] Phone: [phone]   Fax:  [phone]  Name: EUSTACE HUR MRN: [id number] Date of Birth: [birth year]

## 2021-02-25 NOTE — Telephone Encounter (Signed)
Patient states he called yesterday and ask to speak to nurse or Dr. Holley Raring. I could not find msg, so am putting this one in today.  Patient states he is not doing too good and still having a lot of pain since procedure. Please call patient to assess and see if we can help his pain. Thank you

## 2021-02-27 ENCOUNTER — Other Ambulatory Visit: Payer: Self-pay

## 2021-02-27 ENCOUNTER — Encounter: Payer: Self-pay | Admitting: Physical Therapy

## 2021-02-27 ENCOUNTER — Ambulatory Visit: Payer: BC Managed Care – PPO | Admitting: Physical Therapy

## 2021-02-27 DIAGNOSIS — M5441 Lumbago with sciatica, right side: Secondary | ICD-10-CM

## 2021-02-27 DIAGNOSIS — G8929 Other chronic pain: Secondary | ICD-10-CM

## 2021-02-27 NOTE — Therapy (Signed)
[facility] [address] [place], [address] Phone: [phone]   Fax:  [phone]  Physical Therapy Treatment  Patient Details  Name: Wesley Adams MRN: [id number] Date of Birth: 1965-09-06 No data recorded  Encounter Date: 02/27/2021   PT End of Session - 02/27/21 0946     Visit Number 4    Number of Visits 17    Date for PT Re-Evaluation 04/17/21    Authorization Type 19 PT visits thru 04/25/21    Authorization - Visit Number 4    Authorization - Number of Visits 19    Progress Note Due on Visit 10    PT Start Time 3976    PT Stop Time 7341    PT Time Calculation (min) 38 min             Past Medical History:  Diagnosis Date   Chronic back pain    Flu    9/19   Spinal cord stimulator status 04/29/2017   [facility] Scientific Spinal Cord Stimulator placed by Dr. Newman Pies 03/23/2017.    Past Surgical History:  Procedure Laterality Date   COLONOSCOPY WITH PROPOFOL N/A 06/01/2017   Procedure: COLONOSCOPY WITH PROPOFOL;  Surgeon: Lin Landsman, MD;  Location: [place];  Service: Endoscopy;  Laterality: N/A;   ESOPHAGOGASTRODUODENOSCOPY (EGD) WITH PROPOFOL  06/01/2017   Procedure: ESOPHAGOGASTRODUODENOSCOPY (EGD) WITH PROPOFOL;  Surgeon: Lin Landsman, MD;  Location: [place];  Service: Endoscopy;;   [place]   he has a rod   LEFT HEART CATH AND CORONARY ANGIOGRAPHY N/A 12/08/2017   Procedure: LEFT HEART CATH AND CORONARY ANGIOGRAPHY;  Surgeon: Corey Skains, MD;  Location: [place] [facility];  Service: Cardiovascular;  Laterality: N/A;   POLYPECTOMY  06/01/2017   Procedure: POLYPECTOMY;  Surgeon: Lin Landsman, MD;  Location: [place];  Service: Endoscopy;;   POSTERIOR LAMINECTOMY / DECOMPRESSION LUMBAR SPINE  01/30/2015   with fusion, Dr. Arnoldo Morale   SPINAL CORD STIMULATOR INSERTION  03/23/2017   Dr Newman Pies, [facility] Neurosurgery   [place] N/A 06/16/2017   Procedure: SPINAL CORD STIMULATOR REMOVAL;  Surgeon: Newman Pies, MD;  Location: [place];  Service: Neurosurgery;  Laterality: N/A;    There were no vitals filed for this visit.   Subjective Assessment - 02/27/21 0942     Subjective Pt reports LBP has decreased some since last visit. Pt reports being able to sleep better than before.    Pertinent History Pt is a 55 year old male presenting with chronic LBP, current episode more R sided with numbness and tingling down posterior RLE to mid calf area. Current pain began July 2019 when he went to lift 50# box while turning to the right to load a package. Pt would like surgical intervention, but is open to conservative measures. Has had 1 epidural injection, and has another today, reports he had minimal pain relief from first. 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 Pain at rest 7/10 and with aggravating events is 10/10. 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 He would like to be able to get back to full time work, including heavy lifting. Reports his pain is better when he is walking- he can  only sit and standing for any period of time, that he has horrible pain at night, especially R side, wakes up every 3 hours to pace his floors. He reports increased pain when he WB more through RLE to step off curb, negotiate steps (has 12 steps to enter/exit work) or if he is turning to the R. Notices pain relief with "hunching over" forward bending.  In addition to working, he is a father of 69 young children (54,56, and 12 year old twins) that enjoy playing football, soccer, and he has had trouble running and playing with them. PMH L4-5 decompression with fusion and spine stimulator from 2016-2019. Pt  denies N/V, B&B changes, unexplained weight fluctuation, saddle paresthesia, fever, night sweats, or unrelenting night pain at this time.    Limitations Lifting;House hold activities    How long can you sit comfortably? 20mins    How long can you stand comfortably? static less than 39mins    How long can you walk comfortably? unlimited    Patient Stated Goals Decrease pain to return to full work duties             Therex  Recumbent Bike L4 x 5 min for gentle LE strengthening  T-spine Rotation on wall L<>R 1 x 12 reps each direction with cues to hold maintain pelvic alignment   Physioball FWD flex >R x 12 reps with 3 sec hold  *PT continued education on graded exposure, repeated motions exercises, and importance of continuing HEP within pain tolerance to improve activity tolerance   Manual Therapy  LAD to R LE 6 bouts x 15 sec  Lumbar Traction in Hooklying R LE  and BLE 3 x 30 sec each  STM to R QL with patient positioned in prone  *patient reported decrease in LBP and increased tissue extensibility                             PT Education - 02/27/21 1022     Education Details therex form/technique    Person(s) Educated Patient    Methods Explanation;Demonstration    Comprehension Verbalized understanding;Returned demonstration              PT Short Term Goals - 02/18/21 1155       PT SHORT TERM GOAL #1   Title Pt will be independent with HEP in order to improve strength and decrease back pain in order to improve pain-free function at home and work.    Baseline 02/18/21 HEP given    Time 4    Period Weeks    Status New               PT Long Term Goals - 02/18/21 1155       PT LONG TERM GOAL #1   Title Pt will decrease worst back pain as reported on NPRS by at least 2 points in order to demonstrate clinically significant reduction in back pain.    Baseline 02/18/21 10/10    Time 8    Period Weeks    Status New      PT LONG  TERM GOAL #2   Title Patient will increase FOTO score to 60 to demonstrate predicted increase in functional mobility to complete ADLs    Baseline 02/18/21 48    Time 8    Period Weeks    Status New      PT LONG TERM GOAL #3   Title Pt will  demonstrate 10MWT speed of at least 1.57m/s to demonstrate community ambulation speed    Baseline 02/18/21 0.71m/s    Time 8    Period Weeks    Status New      PT LONG TERM GOAL #4   Title Pt will demonstrate R SLS of at least 17 secs to demonstrate PLOF of LLE static balance to demonstrate decreased fall risk    Baseline 02/18/21 R 3sec L 17sec    Time 8    Period Weeks    Status New                   Plan - 02/27/21 1157     Clinical Impression Statement Pt tolerated session well with decreased pain from 7/10 to 6/10 following manual therapy (lumbar traction & STM) and exercise. Pt is still limited by pain but responds well to  repeated flex motions and gently R rotation exercise. Pt educated on graded exposure, repeated motions exercises, and importance of continuing HEP within pain tolerance to improve mobility and tolerance to activity. Continue PT POC as able    Personal Factors and Comorbidities Age;Comorbidity 1;Profession;Time since onset of injury/illness/exacerbation;Past/Current Experience    Comorbidities chronic LBP    Examination-Activity Limitations Lift;Sit;Stand;Carry;Bed Mobility;Stairs;Squat;Transfers    Examination-Participation Restrictions Community Activity;Laundry;Occupation;Driving;Cleaning;Yard Work    [employer] officer Evolving/Moderate complexity    Clinical Decision Making Moderate    Rehab Potential Good    PT Frequency 2x / week    PT Duration 8 weeks    PT Treatment/Interventions ADLs/Self Care Home Management;Iontophoresis 4mg /ml Dexamethasone;Ultrasound;DME Instruction;Functional mobility training;Therapeutic exercise;Joint Manipulations;Spinal Manipulations;Passive range of motion;Dry  needling;Manual techniques;Electrical Stimulation;Moist Heat;Gait training;Stair training;Traction;Cryotherapy;Therapeutic activities;Balance training;Neuromuscular re-education;Patient/family education    PT Next Visit Plan stair and lifting assessment, HEP review,    PT Home Exercise Plan lumbar traction, DKTC, repeated lumbar flex + lateral flex R, posterior pelvic tilts             Patient will benefit from skilled therapeutic intervention in order to improve the following deficits and impairments:  Abnormal gait, Decreased activity tolerance, Decreased endurance, Decreased range of motion, Decreased strength, Hypomobility, Pain, Improper body mechanics, Impaired UE functional use, Postural dysfunction, Impaired tone, Impaired flexibility, Increased muscle spasms, Decreased balance, Decreased mobility, Decreased safety awareness, Difficulty walking  Visit Diagnosis: Chronic right-sided low back pain with right-sided sciatica     Problem List 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   Chronic radicular lumbar pain 11/25/2020   Lumbar spondylosis 05/03/2018   Coronary artery disease involving native heart 12/12/2017   Iliac artery stenosis, left ([place]) 12/06/2017   Atherosclerosis of aorta ([place]) 12/06/2017   Chronic bilateral low back pain with bilateral sciatica 10/03/2017   Chronic pain syndrome 10/03/2017   Tobacco use 05/27/2017   Other intervertebral disc degeneration, lumbar region 01/30/2015      Durwin Reges DPT Sharion Settler, SPT  Durwin Reges, PT 02/27/2021, 12:05 PM  [facility] [address] [place], [address] Phone: [phone]   Fax:  [phone]  Name: Wesley Adams MRN: [id number] Date of Birth: 11-27-1965

## 2021-03-03 ENCOUNTER — Ambulatory Visit: Payer: BC Managed Care – PPO | Admitting: Physical Therapy

## 2021-03-03 NOTE — Progress Notes (Signed)
Name: Wesley Adams   MRN: [id number]    DOB: 11/26/1965   Date:03/04/2021       Progress Note  Subjective  Chief Complaint  Follow Up  HPI  Chronic back pain: he has a long history of back pain, he had posterior laminectomy and decompression lumbar spine by Dr. Arnoldo Morale back in 01/2015, after that he got a spinal stimulator in 02/2017 however it was very bulky and bothersome so he had it removed in 05/2017. He is currently having PT and seeing Dr. Holley Raring , he is taking celebrex, lyrica and also had two steroid injections in the past month with only temporary relieve. Dr. Holley Raring recommended follow up with neurosurgeon , he is now seeing Dr. Renda Rolls at [facility] and may need a spinal fusion at L2-3 and advised to repeat L2-4 facet blocks with radiofrequency ablation. He has daily , constant pain on lumbar spine with burning sensation going down his legs, aggravated by movement, worse when lifting, twisting, bending, sitting for prolonged periods of time. No bowel or bladder incontinence. He was trying to work but due to increase in pain he has not been back since 10/27 ( he works at a [employer] ). We will fill out FMLA for his work, excusing him from work for the next 6-8 weeks.   Atherosclerosis aorta/iliac artery stenosis/CAD: advised him to stop smoking, continue Atorvastatin , and aspirin 81 mg. He denies side effects of medication. 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   Chronic radicular lumbar pain 11/25/2020   Lumbar spondylosis 05/03/2018   Coronary artery disease involving native heart 12/12/2017   Iliac artery stenosis, left ([place]) 12/06/2017   Atherosclerosis of aorta ([place]) 12/06/2017   Chronic bilateral low back pain with bilateral sciatica 10/03/2017   Chronic pain syndrome 10/03/2017   Tobacco use  05/27/2017   Other intervertebral disc degeneration, lumbar region 01/30/2015    Past Surgical History:  Procedure Laterality Date   COLONOSCOPY WITH PROPOFOL N/A 06/01/2017   Procedure: COLONOSCOPY WITH PROPOFOL;  Surgeon: Lin Landsman, MD;  Location: [place];  Service: Endoscopy;  Laterality: N/A;   ESOPHAGOGASTRODUODENOSCOPY (EGD) WITH PROPOFOL  06/01/2017   Procedure: ESOPHAGOGASTRODUODENOSCOPY (EGD) WITH PROPOFOL;  Surgeon: Lin Landsman, MD;  Location: [place];  Service: Endoscopy;;   [place]   he has a rod   LEFT HEART CATH AND CORONARY ANGIOGRAPHY N/A 12/08/2017   Procedure: LEFT HEART CATH AND CORONARY ANGIOGRAPHY;  Surgeon: Corey Skains, MD;  Location: [place] [facility];  Service: Cardiovascular;  Laterality: N/A;   POLYPECTOMY  06/01/2017   Procedure: POLYPECTOMY;  Surgeon: Lin Landsman, MD;  Location: [place];  Service: Endoscopy;;   POSTERIOR LAMINECTOMY / DECOMPRESSION LUMBAR SPINE  01/30/2015   with fusion, Dr. Arnoldo Morale   SPINAL CORD STIMULATOR INSERTION  03/23/2017   Dr Newman Pies, [facility]   [place] N/A 06/16/2017   Procedure: SPINAL CORD STIMULATOR REMOVAL;  Surgeon: Newman Pies, MD;  Location: [place];  Service: Neurosurgery;  Laterality: N/A;    Family History  Problem Relation Age of Onset   Alcohol abuse Mother    Heart attack Father    Heart disease Father    Obesity Father    Heart attack Paternal Grandfather  Social History   Tobacco Use   Smoking status: Every Day    Packs/day: 1.00    Years: 35.00    Pack years: 35.00    Types: Cigarettes    Start date: 08/02/1983   Smokeless tobacco: Never  Substance Use Topics   Alcohol use: Yes    Alcohol/week: 0.0 standard drinks    Comment: occasionally drinks beer     Current Outpatient Medications:    atorvastatin (LIPITOR) 40 MG tablet, Take 1 tablet (40 mg total) by  mouth daily., Disp: 90 tablet, Rfl: 1   celecoxib (CELEBREX) 100 MG capsule, Take 100 mg by mouth 2 (two) times daily., Disp: , Rfl:    celecoxib (CELEBREX) 100 MG capsule, Take by mouth., Disp: , Rfl:    lidocaine (LIDODERM) 5 %, See admin instructions., Disp: , Rfl:    meloxicam (MOBIC) 15 MG tablet, Take 1 tablet (15 mg total) by mouth daily., Disp: 90 tablet, Rfl: 1   predniSONE (DELTASONE) 20 MG tablet, Take 3 tablets (60 mg total) by mouth daily with breakfast for 3 days, THEN 2 tablets (40 mg total) daily with breakfast for 3 days, THEN 1 tablet (20 mg total) daily with breakfast for 3 days., Disp: 18 tablet, Rfl: 0   pregabalin (LYRICA) 50 MG capsule, Take 1 capsule (50 mg total) by mouth 2 (two) times daily., Disp: 60 capsule, Rfl: 2   tiZANidine (ZANAFLEX) 4 MG tablet, Take 1 tablet (4 mg total) by mouth every 8 (eight) hours as needed for muscle spasms., Disp: 90 tablet, Rfl: 0  No Known Allergies  I personally reviewed active problem list, medication list, allergies, family history, social history, health maintenance with the patient/caregiver today.   ROS  Constitutional: Negative for fever or weight change.  Respiratory: Negative for cough and shortness of breath.   Cardiovascular: Negative for chest pain or palpitations.  Gastrointestinal: Negative for abdominal pain, no bowel changes.  Musculoskeletal: Negative for gait problem or joint swelling.  Skin: Negative for rash.  Neurological: Negative for dizziness or headache.  No other specific complaints in a complete review of systems (except as listed in HPI above).   Objective  Vitals:   03/04/21 1530  BP: 126/68  Pulse: 72  Resp: 16  Temp: 98.2 F (36.8 C)  SpO2: 98%  Weight: 198 lb (89.8 kg)  Height: 6\' 1"  (1.854 m)    Body mass index is 26.12 kg/m.  Physical Exam  Constitutional: Patient appears well-developed and well-nourished.  No distress.  HEENT: head atraumatic, normocephalic, pupils equal and  reactive to light,  neck supple Cardiovascular: Normal rate, regular rhythm and normal heart sounds.  No murmur heard. No BLE edema. Pulmonary/Chest: Effort normal and breath sounds normal. No respiratory distress. Abdominal: Soft.  There is no tenderness. Psychiatric: Patient has a normal mood and affect. behavior is normal. Judgment and thought content normal.  Muscular Skeletal: tender during palpation of lumbar spine, positive straight leg raise  PHQ2/9: Depression screen [facility] 2/9 03/04/2021 11/25/2020 11/20/2020 11/12/2020 06/15/2019  Decreased Interest 2 0 0 0 0  Down, Depressed, Hopeless 0 0 0 0 0  PHQ - 2 Score 2 0 0 0 0  Altered sleeping 0 - - - 0  Tired, decreased energy 1 - - - 0  Change in appetite 0 - - - 0  Feeling bad or failure about yourself  0 - - - 0  Trouble concentrating 0 - - - 0  Moving slowly or fidgety/restless 0 - - -  0  Suicidal thoughts 0 - - - 0  PHQ-9 Score 3 - - - 0  Difficult doing work/chores - - - - Not difficult at all  Some recent data might be hidden    phq 9 is positive   Fall Risk: Fall Risk  03/04/2021 02/18/2021 01/28/2021 11/25/2020 11/20/2020  Falls in the past year? 1 0 0 0 1  Number falls in past yr: 0 - - - 0  Injury with Fall? 0 - - - 0  Comment - - - - -  Risk for fall due to : No Fall Risks - - - -  Follow up Falls prevention discussed - - - -      Functional Status Survey: Is the patient deaf or have difficulty hearing?: No Does the patient have difficulty seeing, even when wearing glasses/contacts?: No Does the patient have difficulty concentrating, remembering, or making decisions?: No Does the patient have difficulty walking or climbing stairs?: Yes Does the patient have difficulty dressing or bathing?: No Does the patient have difficulty doing errands alone such as visiting a doctor's office or shopping?: No    Assessment & Plan  1. Atherosclerosis of aorta (HCC)  Continue statin therapy   2. Pure  hypercholesterolemia   3. Iliac artery stenosis, left (HCC)  No symptoms   4. History of back surgery   5. Chronic radicular lumbar pain   6. Chronic bilateral low back pain with bilateral sciatica   7. Need for shingles vaccine  Out of stock   8. Needs flu shot  Refused   9. History of lumbar fusion (L4/5)   10. Muscle spasm  - tiZANidine (ZANAFLEX) 4 MG tablet; Take 1 tablet (4 mg total) by mouth at bedtime.  Dispense: 90 tablet; Refill: 0

## 2021-03-04 ENCOUNTER — Encounter: Payer: Self-pay | Admitting: Family Medicine

## 2021-03-04 ENCOUNTER — Other Ambulatory Visit: Payer: Self-pay

## 2021-03-04 ENCOUNTER — Ambulatory Visit
Payer: BC Managed Care – PPO | Attending: Student in an Organized Health Care Education/Training Program | Admitting: Student in an Organized Health Care Education/Training Program

## 2021-03-04 ENCOUNTER — Encounter: Payer: Self-pay | Admitting: Physical Therapy

## 2021-03-04 ENCOUNTER — Ambulatory Visit: Payer: BC Managed Care – PPO | Admitting: Physical Therapy

## 2021-03-04 ENCOUNTER — Ambulatory Visit (INDEPENDENT_AMBULATORY_CARE_PROVIDER_SITE_OTHER): Payer: BC Managed Care – PPO | Admitting: Family Medicine

## 2021-03-04 ENCOUNTER — Encounter: Payer: Self-pay | Admitting: Student in an Organized Health Care Education/Training Program

## 2021-03-04 VITALS — BP 126/68 | HR 72 | Temp 98.2°F | Resp 16 | Ht 73.0 in | Wt 198.0 lb

## 2021-03-04 DIAGNOSIS — G8929 Other chronic pain: Secondary | ICD-10-CM

## 2021-03-04 DIAGNOSIS — M5416 Radiculopathy, lumbar region: Secondary | ICD-10-CM | POA: Diagnosis not present

## 2021-03-04 DIAGNOSIS — M961 Postlaminectomy syndrome, not elsewhere classified: Secondary | ICD-10-CM

## 2021-03-04 DIAGNOSIS — G894 Chronic pain syndrome: Secondary | ICD-10-CM | POA: Diagnosis not present

## 2021-03-04 DIAGNOSIS — I771 Stricture of artery: Secondary | ICD-10-CM

## 2021-03-04 DIAGNOSIS — I700 Atherosclerosis of aorta: Secondary | ICD-10-CM

## 2021-03-04 DIAGNOSIS — M5442 Lumbago with sciatica, left side: Secondary | ICD-10-CM

## 2021-03-04 DIAGNOSIS — Z9889 Other specified postprocedural states: Secondary | ICD-10-CM | POA: Diagnosis not present

## 2021-03-04 DIAGNOSIS — Z981 Arthrodesis status: Secondary | ICD-10-CM

## 2021-03-04 DIAGNOSIS — E7800 Pure hypercholesterolemia, unspecified: Secondary | ICD-10-CM

## 2021-03-04 DIAGNOSIS — M62838 Other muscle spasm: Secondary | ICD-10-CM

## 2021-03-04 DIAGNOSIS — Z23 Encounter for immunization: Secondary | ICD-10-CM

## 2021-03-04 DIAGNOSIS — M5441 Lumbago with sciatica, right side: Secondary | ICD-10-CM | POA: Diagnosis not present

## 2021-03-04 MED ORDER — TIZANIDINE HCL 4 MG PO TABS: 4.0000 mg | ORAL_TABLET | Freq: Every day | ORAL | 0 refills | Status: DC

## 2021-03-04 NOTE — Therapy (Signed)
[place] [facility] [address], [place], [address] Phone: [phone]   Fax:  [phone]  Physical Therapy Treatment  Patient Details  Name: Wesley Adams MRN: [id number] Date of Birth: May 16, 1965 No data recorded  Encounter Date: 03/04/2021   PT End of Session - 03/04/21 1038     Visit Number 5    Number of Visits 17    Date for PT Re-Evaluation 04/17/21    Authorization Type 19 PT visits thru 04/25/21    Authorization - Visit Number 5    Authorization - Number of Visits 19    Progress Note Due on Visit 10    PT Start Time 0786    PT Stop Time 1110    PT Time Calculation (min) 38 min    Activity Tolerance Patient tolerated treatment well    Behavior During Therapy [facility] for tasks assessed/performed             Past Medical History:  Diagnosis Date   Chronic back pain    Flu    9/19   Spinal cord stimulator status 04/29/2017   [facility] Spinal Cord Stimulator placed by Dr. Newman Pies 03/23/2017.    Past Surgical History:  Procedure Laterality Date   COLONOSCOPY WITH PROPOFOL N/A 06/01/2017   Procedure: COLONOSCOPY WITH PROPOFOL;  Surgeon: Lin Landsman, MD;  Location: [place];  Service: Endoscopy;  Laterality: N/A;   ESOPHAGOGASTRODUODENOSCOPY (EGD) WITH PROPOFOL  06/01/2017   Procedure: ESOPHAGOGASTRODUODENOSCOPY (EGD) WITH PROPOFOL;  Surgeon: Lin Landsman, MD;  Location: [place];  Service: Endoscopy;;   [place]   he has a rod   LEFT HEART CATH AND CORONARY ANGIOGRAPHY N/A 12/08/2017   Procedure: LEFT HEART CATH AND CORONARY ANGIOGRAPHY;  Surgeon: Corey Skains, MD;  Location: [place] [facility];  Service: Cardiovascular;  Laterality: N/A;   POLYPECTOMY  06/01/2017   Procedure: POLYPECTOMY;  Surgeon: Lin Landsman, MD;  Location: [place];  Service: Endoscopy;;   POSTERIOR LAMINECTOMY /  DECOMPRESSION LUMBAR SPINE  01/30/2015   with fusion, Dr. Arnoldo Morale   SPINAL CORD STIMULATOR INSERTION  03/23/2017   Dr Newman Pies, [facility]   [place] N/A 06/16/2017   Procedure: SPINAL CORD STIMULATOR REMOVAL;  Surgeon: Newman Pies, MD;  Location: [place];  Service: Neurosurgery;  Laterality: N/A;    There were no vitals filed for this visit.   Subjective Assessment - 03/04/21 1035     Subjective Pt reports decrease in LBP and improvement in performance of HEP last session.    Pertinent History Pt is a 55 year old male presenting with chronic LBP, current episode more R sided with numbness and tingling down posterior RLE to mid calf area. Current pain began July 2019 when he went to lift 50# box while turning to the right to load a package. Pt would like surgical intervention, but is open to conservative measures. Has had 1 epidural injection, and has another today, reports he had minimal pain relief from first. 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 Pain at rest 7/10 and with aggravating events is 10/10. 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 He would like to be able to get back to full time work,  including heavy lifting. Reports his pain is better when he is walking- he can only sit and standing for any period of time, that he has horrible pain at night, especially R side, wakes up every 3 hours to pace his floors. He reports increased pain when he WB more through RLE to step off curb, negotiate steps (has 12 steps to enter/exit work) or if he is turning to the R. Notices pain relief with "hunching over" forward bending.  In addition to working, he is a father of 27 young children (54,110, and 5 year old twins) that enjoy playing football, soccer, and he has had trouble  running and playing with them. PMH L4-5 decompression with fusion and spine stimulator from 2016-2019. Pt denies N/V, B&B changes, unexplained weight fluctuation, saddle paresthesia, fever, night sweats, or unrelenting night pain at this time.    Limitations Lifting;House hold activities    How long can you sit comfortably? 37mins    How long can you stand comfortably? static less than 70mins    How long can you walk comfortably? unlimited    Patient Stated Goals Decrease pain to return to full work duties             Therex   Nu Step L2 x 5 min for gentle LE strengthening  TRX squats 2 x 10 reps with cues for foot alignment and fwd flex  TRX Half angles R>L 1 x 12 reps demonstration needed for success   T-spine Rotation on wall R>L 1 x 12 reps each direction with cues to hold maintain pelvic alignment    Physioball FWD flex L->R x 12 reps with 3 sec hold   *PT continued education on graded exposure, repeated motions exercises, and importance of continuing HEP within pain tolerance to improve activity tolerance    Manual Therapy   LAD to R LE 6 bouts x 15 sec   Lumbar Traction in Hooklying R LE  and BLE 3 x 30 sec each   IASTM to R QL with patient positioned in prone   *patient reported decrease in LBP and increased tissue extensibility following manual therapy.            PT Education - 03/04/21 1118     Education Details therex form/technique    Person(s) Educated Patient    Methods Explanation;Demonstration    Comprehension Verbalized understanding;Returned demonstration              PT Short Term Goals - 02/18/21 1155       PT SHORT TERM GOAL #1   Title Pt will be independent with HEP in order to improve strength and decrease back pain in order to improve pain-free function at home and work.    Baseline 02/18/21 HEP given    Time 4    Period Weeks    Status New               PT Long Term Goals - 02/18/21 1155       PT LONG TERM GOAL #1    Title Pt will decrease worst back pain as reported on NPRS by at least 2 points in order to demonstrate clinically significant reduction in back pain.    Baseline 02/18/21 10/10    Time 8    Period Weeks    Status New      PT LONG TERM GOAL #2   Title Patient will increase FOTO score to 60 to demonstrate predicted increase in functional mobility to complete  ADLs    Baseline 02/18/21 48    Time 8    Period Weeks    Status New      PT LONG TERM GOAL #3   Title Pt will demonstrate 10MWT speed of at least 1.57m/s to demonstrate community ambulation speed    Baseline 02/18/21 0.60m/s    Time 8    Period Weeks    Status New      PT LONG TERM GOAL #4   Title Pt will demonstrate R SLS of at least 17 secs to demonstrate PLOF of LLE static balance to demonstrate decreased fall risk    Baseline 02/18/21 R 3sec L 17sec    Time 8    Period Weeks    Status New                   Plan - 03/04/21 1113     Clinical Impression Statement Pt tolerated session well with decreased pain from 5/10 to 4/10 following manual therapy (lumbar traction & IASTM) and exercise. Pt continues to be limited by pain but responds well to IASTM, repeated flex motions, lateral flexion R>L, and was able to paticipate in strength related exercise. Pt continued education on graded exposure, repeated motions exercises, and importance of continuing HEP within pain tolerance to improve mobility and tolerance to activity. Continue PT POC as able    Personal Factors and Comorbidities Age;Comorbidity 1;Profession;Time since onset of injury/illness/exacerbation;Past/Current Experience    Comorbidities chronic LBP    Examination-Activity Limitations Lift;Sit;Stand;Carry;Bed Mobility;Stairs;Squat;Transfers    Examination-Participation Restrictions Community Activity;Laundry;Occupation;Driving;Cleaning;Yard Work    [employer] Evolving/Moderate complexity    Clinical Decision Making Moderate     Rehab Potential Good    PT Frequency 2x / week    PT Duration 8 weeks    PT Treatment/Interventions ADLs/Self Care Home Management;Iontophoresis 4mg /ml Dexamethasone;Ultrasound;DME Instruction;Functional mobility training;Therapeutic exercise;Joint Manipulations;Spinal Manipulations;Passive range of motion;Dry needling;Manual techniques;Electrical Stimulation;Moist Heat;Gait training;Stair training;Traction;Cryotherapy;Therapeutic activities;Balance training;Neuromuscular re-education;Patient/family education    PT Next Visit Plan stair and lifting assessment, HEP review,    PT Home Exercise Plan lumbar traction, DKTC, repeated lumbar flex + lateral flex R, posterior pelvic tilts    Consulted and Agree with Plan of Care Patient             Patient will benefit from skilled therapeutic intervention in order to improve the following deficits and impairments:  Abnormal gait, Decreased activity tolerance, Decreased endurance, Decreased range of motion, Decreased strength, Hypomobility, Pain, Improper body mechanics, Impaired UE functional use, Postural dysfunction, Impaired tone, Impaired flexibility, Increased muscle spasms, Decreased balance, Decreased mobility, Decreased safety awareness, Difficulty walking  Visit Diagnosis: Chronic right-sided low back pain with right-sided sciatica     Problem List 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   Chronic radicular lumbar pain 11/25/2020   Lumbar spondylosis 05/03/2018   Coronary artery disease involving native heart 12/12/2017   Iliac artery stenosis, left ([place]) 12/06/2017   Atherosclerosis of aorta ([place]) 12/06/2017   Chronic bilateral low back pain with bilateral sciatica 10/03/2017   Chronic pain syndrome 10/03/2017   Tobacco use 05/27/2017   Other intervertebral disc degeneration, lumbar region 01/30/2015     Wesley Adams  DPT Sharion Settler, SPT  Wesley Adams, PT 03/04/2021, 5:00 PM  [place] [facility] [address], [place], [address] Phone: [phone]   Fax:  [phone]  Name: Wesley Adams MRN: [id number] Date of Birth: [birth year]

## 2021-03-04 NOTE — Progress Notes (Signed)
Patient: Wesley Adams  Service Category: E/M  Provider: Gillis Santa, MD  DOB: 1965/12/06  DOS: 03/04/2021  Location: Office  MRN: [id number]  Setting: Ambulatory outpatient  Referring Provider: Steele Sizer, MD  Type: Established Patient  Specialty: Interventional Pain Management  PCP: Steele Sizer, MD  Location: Remote location  Delivery: TeleHealth     Virtual Encounter - Pain Management PROVIDER NOTE: Information contained herein reflects review and annotations entered in association with encounter. Interpretation of such information and data should be left to medically-trained personnel. Information provided to patient can be located elsewhere in the medical record under "Patient Instructions". Document created using STT-dictation technology, any transcriptional errors that may result from process are unintentional.    Contact & Pharmacy Preferred: [phone] Home: [phone] (home) Mobile: [phone] (mobile) E-mail: [email or web address]  [id number] [place] [address]: [phone]: [phone] [place] [address]Phone: [phone]: [phone]  Pre-screening  Wesley Adams "in-person" vs "virtual" encounter. He indicated preferring virtual for this encounter.   Reason COVID-19*  Social distancing based on CDC and AMA recommendations.   I contacted Wesley Jeffersonon 03/04/2021 via telephone.      I clearly identified myself as BGillis Santa MD. I verified that I was speaking with the correct person using two identifiers (Name: Wesley Adams and date of birth: [birth year].  Consent I sought verbal advanced consent from Wesley Jeffersonfor virtual visit interactions. I informed [name] possible security and privacy concerns,  risks, and limitations associated with providing "not-in-person" medical evaluation and management services. I also informed Wesley Adams the availability of "in-person" appointments. Finally, I informed him that there would be a charge for the virtual visit and that he could be  personally, fully or partially, financially responsible for it. Wesley Adams understanding and agreed to proceed.   Historic Elements   Mr. THONOR FAIRBANKis a 55y.o. year old, male patient evaluated today after our last contact on 02/25/2021. Wesley Adams has a past medical history of Chronic back pain, Flu, and Spinal cord stimulator status (04/29/2017). He also  has a past surgical history that includes Spinal cord stimulator insertion (03/23/2017); Colonoscopy with propofol (N/A, 06/01/2017); Esophagogastroduodenoscopy (egd) with propofol (06/01/2017); polypectomy (06/01/2017); Spinal cord stimulator removal (N/A, 06/16/2017); LEFT HEART CATH AND CORONARY ANGIOGRAPHY (N/A, 12/08/2017); Posterior laminectomy / decompression lumbar spine (01/30/2015); and Femur fracture surgery (Right, 1985). Mr. JScheerhas a current medication list which includes the following prescription(s): atorvastatin, celecoxib, lidocaine, prednisone, pregabalin, celecoxib, meloxicam, and tizanidine. He  reports that he has been smoking cigarettes. He started smoking about 37 years ago. He has a 35.00 pack-year smoking history. He has never used smokeless tobacco. He reports current alcohol use. He reports that he does not use drugs. Mr. JLundehas No Known Allergies.   HPI  Today, he is being contacted for a post-procedure assessment.   Post-Procedure Evaluation  Procedure (02/18/2021):  Procedure:          Anesthesia, Analgesia, Anxiolysis:  Type: Trans-Foraminal Epidural Steroid Injection #1  Purpose: Diagnostic/Therapeutic Region: Posterolateral Lumbosacral Target Area: The 6 o'clock position under the pedicle, on the affected  side. Approach: Posterior Percutaneous Paravertebral approach. Level: L5, S1 Level Laterality: Right-Sided  Anesthesia: Local (1-2% Lidocaine)  Anxiolysis: Oral 5 mg p.o. Valium Sedation: Minimal  Guidance: Fluoroscopy           Position: Prone   Indications: 1. Lumbar radiculopathy   2. Chronic radicular lumbar pain   3. Chronic pain syndrome    Pain Score: Pre-procedure: 8 /10 Post-procedure: 7 /10     Anxiolysis: Please see nurses note.  Effectiveness during initial hour after procedure (Ultra-Short Term Relief): 50 %   Local anesthetic used: Long-acting (4-6 hours) Effectiveness: Defined as any analgesic benefit obtained secondary to the administration of local anesthetics. This carries significant diagnostic value as to the etiological location, or anatomical origin, of the pain. Duration of benefit is expected to coincide with the duration of the local anesthetic used.  Effectiveness during initial 4-6 hours after procedure (Short-Term Relief): 50 %   Long-term benefit: Defined as any relief past the pharmacologic duration of the local anesthetics.  Effectiveness past the initial 6 hours after procedure (Long-Term Relief): 50 % (lasting 2 days)   Benefits, current: Defined as benefit present at the time of this evaluation.   Analgesia: 0% Function: Back to baseline Laboratory Chemistry Profile   Renal Lab Results  Component Value Date   BUN 14 11/12/2020   CREATININE 1.02 62/83/1517   BCR NOT APPLICABLE 61/60/7371   GFRAA 118 01/02/2018   GFRNONAA 102 01/02/2018    Hepatic Lab Results  Component Value Date   AST 20 11/12/2020   ALT 14 11/12/2020   ALBUMIN 4.4 12/06/2017   ALKPHOS 64 12/06/2017   LIPASE 41 12/06/2017    Electrolytes Lab Results  Component Value Date   NA 141 11/12/2020   K 4.0 11/12/2020   CL 107 11/12/2020   CALCIUM 9.6 11/12/2020    Bone No results found for: VD25OH, VD125OH2TOT, GG2694WN4, OE7035KK9, 25OHVITD1, 25OHVITD2,  25OHVITD3, TESTOFREE, TESTOSTERONE  Inflammation (CRP: Acute Phase) (ESR: Chronic Phase) No results found for: CRP, ESRSEDRATE, LATICACIDVEN       Note: Above Lab results reviewed.    Assessment  The primary encounter diagnosis was Lumbar radiculopathy. Diagnoses of Chronic radicular lumbar pain, History of lumbar fusion (L4/5), Failed back surgical syndrome, and Chronic pain syndrome were also pertinent to this visit.  Plan of Care  Unfortunately, Mr. Cali only received about 3 days of moderate (50%) pain relief after his right transforaminal ESI at L5 and S1.  He states that his pain now is back to baseline as it was prior to injection.  He has an appointment with his PCP today to get assistance with short-term disability as he is having trouble working.  He works in a [employer].  He states that he had a spinal cord stimulator in the past which was helpful for his lower extremity pain however he was having trouble with the IPG protruding out of his back.  This was removed as result.  He is interested in considering this again given that there are smaller IPG's such as Medtronic Intellis.  I have instructed him to reach out to his neurosurgeon to discuss next treatment steps which could include surgical decompression and fusion or reconsidering spinal cord stim implant with a smaller IPG.  Follow-up plan:   Return if symptoms worsen or fail to improve.    Recent Visits Date Type Provider Dept  02/18/21 Procedure visit Gillis Santa, MD [facility]  02/16/21 Office Visit Gillis Santa, MD [facility]  01/28/21 Procedure visit Gillis Santa, [facility]  12/30/20 Office Visit  Gillis Santa, MD [facility]  Showing recent visits within past 90 days and meeting all other requirements Today's Visits Date Type Provider Dept  03/04/21 Office Visit Gillis Santa, MD [facility]  Showing today's visits and meeting all other  requirements Future Appointments Date Type Provider Dept  03/10/21 Appointment Gillis Santa, MD [facility]  Showing future appointments within next 90 days and meeting all other requirements I discussed the assessment and treatment plan with the patient. The patient was provided an opportunity to ask questions and all were answered. The patient agreed with the plan and demonstrated an understanding of the instructions.  Patient advised to call back or seek an in-person evaluation if the symptoms or condition worsens.  Duration of encounter: 66mnutes.  Note by: BGillis Santa MD Date: 03/04/2021; Time: 2:24 PM

## 2021-03-06 ENCOUNTER — Ambulatory Visit: Payer: BC Managed Care – PPO | Admitting: Physical Therapy

## 2021-03-06 ENCOUNTER — Other Ambulatory Visit: Payer: Self-pay

## 2021-03-06 ENCOUNTER — Encounter: Payer: Self-pay | Admitting: Physical Therapy

## 2021-03-06 DIAGNOSIS — M5441 Lumbago with sciatica, right side: Secondary | ICD-10-CM | POA: Diagnosis not present

## 2021-03-06 DIAGNOSIS — G8929 Other chronic pain: Secondary | ICD-10-CM

## 2021-03-06 NOTE — Therapy (Signed)
[facility] [address], [place], [address] Phone: [phone]   Fax:  [phone]  Physical Therapy Treatment  Patient Details  Name: TERRELLE RUFFOLO MRN: [id number] Date of Birth: 25-Jun-1965 No data recorded  Encounter Date: 03/06/2021   PT End of Session - 03/06/21 0924     Visit Number 6    Number of Visits 17    Date for PT Re-Evaluation 04/17/21    Authorization Type 19 PT visits thru 04/25/21    Authorization - Visit Number 6    Authorization - Number of Visits 19    Progress Note Due on Visit 10    PT Start Time 0925    PT Stop Time 1007    PT Time Calculation (min) 42 min    Activity Tolerance Patient tolerated treatment well    Behavior During Therapy [facility] for tasks assessed/performed             Past Medical History:  Diagnosis Date   Chronic back pain    Flu    9/19   Spinal cord stimulator status 04/29/2017   [facility] Scientific Spinal Cord Stimulator placed by Dr. Newman Pies 03/23/2017.    Past Surgical History:  Procedure Laterality Date   COLONOSCOPY WITH PROPOFOL N/A 06/01/2017   Procedure: COLONOSCOPY WITH PROPOFOL;  Surgeon: Lin Landsman, MD;  Location: [place];  Service: Endoscopy;  Laterality: N/A;   ESOPHAGOGASTRODUODENOSCOPY (EGD) WITH PROPOFOL  06/01/2017   Procedure: ESOPHAGOGASTRODUODENOSCOPY (EGD) WITH PROPOFOL;  Surgeon: Lin Landsman, MD;  Location: [place];  Service: Endoscopy;;   [place]   he has a rod   LEFT HEART CATH AND CORONARY ANGIOGRAPHY N/A 12/08/2017   Procedure: LEFT HEART CATH AND CORONARY ANGIOGRAPHY;  Surgeon: Corey Skains, MD;  Location: [place] [facility];  Service: Cardiovascular;  Laterality: N/A;   POLYPECTOMY  06/01/2017   Procedure: POLYPECTOMY;  Surgeon: Lin Landsman, MD;  Location: [place];  Service: Endoscopy;;   POSTERIOR LAMINECTOMY /  DECOMPRESSION LUMBAR SPINE  01/30/2015   with fusion, Dr. Arnoldo Morale   SPINAL CORD STIMULATOR INSERTION  03/23/2017   Dr Newman Pies, [facility] Neurosurgery   [place] N/A 06/16/2017   Procedure: SPINAL CORD STIMULATOR REMOVAL;  Surgeon: Newman Pies, MD;  Location: [place];  Service: Neurosurgery;  Laterality: N/A;    There were no vitals filed for this visit.   Subjective Assessment - 03/06/21 0932     Subjective Pt reports pain 6/10 pain but feels that he is able to move better.    Pertinent History Pt is a 55 year old male presenting with chronic LBP, current episode more R sided with numbness and tingling down posterior RLE to mid calf area. Current pain began July 2019 when he went to lift 50# box while turning to the right to load a package. Pt would like surgical intervention, but is open to conservative measures. Has had 1 epidural injection, and has another today, reports he had minimal pain relief from first. 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 Pain at rest 7/10 and with aggravating events is 10/10. 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 He would like to be able to get back to full time  work, including heavy lifting. Reports his pain is better when he is walking- he can only sit and standing for any period of time, that he has horrible pain at night, especially R side, wakes up every 3 hours to pace his floors. He reports increased pain when he WB more through RLE to step off curb, negotiate steps (has 12 steps to enter/exit work) or if he is turning to the R. Notices pain relief with "hunching over" forward bending.  In addition to working, he is a father of 49 young children (12,31, and 63 year old twins) that enjoy playing football, soccer, and he has had trouble running and  playing with them. PMH L4-5 decompression with fusion and spine stimulator from 2016-2019. Pt denies N/V, B&B changes, unexplained weight fluctuation, saddle paresthesia, fever, night sweats, or unrelenting night pain at this time.    Limitations Lifting;House hold activities    How long can you sit comfortably? 72mins    How long can you stand comfortably? static less than 44mins    How long can you walk comfortably? unlimited    Patient Stated Goals Decrease pain to return to full work duties              Therex  Recumbent Bike L3 x 5 min  Squats to Chair 2 x 10 reps with cues for eccentric control and decreased UE reliance  Pallof press GTB 2 x 30 sec each side with cues for anti-rotation and demonstration and verbal cueing needed for good form  Standard plank 3 x 10 sec with cueing for proper form and decreased thoracic kyphosis  Standing Lumbar flexion 2  x 12 reps for decreased pain between sets  Manual Therapy   IASTM to R QL with patient positioned in prone   *patient reported decrease in LBP and increased tissue extensibility           PT Education - 03/06/21 1157     Education Details therex form/technique    Person(s) Educated Patient    Methods Explanation;Demonstration    Comprehension Verbalized understanding;Returned demonstration              PT Short Term Goals - 02/18/21 1155       PT SHORT TERM GOAL #1   Title Pt will be independent with HEP in order to improve strength and decrease back pain in order to improve pain-free function at home and work.    Baseline 02/18/21 HEP given    Time 4    Period Weeks    Status New               PT Long Term Goals - 02/18/21 1155       PT LONG TERM GOAL #1   Title Pt will decrease worst back pain as reported on NPRS by at least 2 points in order to demonstrate clinically significant reduction in back pain.    Baseline 02/18/21 10/10    Time 8    Period Weeks    Status New      PT LONG  TERM GOAL #2   Title Patient will increase FOTO score to 60 to demonstrate predicted increase in functional mobility to complete ADLs    Baseline 02/18/21 48    Time 8    Period Weeks    Status New      PT LONG TERM GOAL #3   Title Pt will demonstrate 10MWT speed of at least 1.61m/s to demonstrate community ambulation speed  Baseline 02/18/21 0.25m/s    Time 8    Period Weeks    Status New      PT LONG TERM GOAL #4   Title Pt will demonstrate R SLS of at least 17 secs to demonstrate PLOF of LLE static balance to demonstrate decreased fall risk    Baseline 02/18/21 R 3sec L 17sec    Time 8    Period Weeks    Status New                   Plan - 03/06/21 1151     Clinical Impression Statement Pt tolerated session well with decreased pain from 6/10 to 5/10 following manual therapy (IASTM) and exercise. Pt tolerance for exercise has increase and was able to participate in core strengthneing exercise this session.  Pt is motivated and able to follow multmodal cueing to correct form.  Continue PT POC as able    Personal Factors and Comorbidities Age;Comorbidity 1;Profession;Time since onset of injury/illness/exacerbation;Past/Current Experience    Comorbidities chronic LBP    Examination-Activity Limitations Lift;Sit;Stand;Carry;Bed Mobility;Stairs;Squat;Transfers    Examination-Participation Restrictions Community Activity;Laundry;Occupation;Driving;Cleaning;Yard Work    [employer] Evolving/Moderate complexity    Clinical Decision Making Moderate    Rehab Potential Good    PT Frequency 2x / week    PT Duration 8 weeks    PT Treatment/Interventions ADLs/Self Care Home Management;Iontophoresis 4mg /ml Dexamethasone;Ultrasound;DME Instruction;Functional mobility training;Therapeutic exercise;Joint Manipulations;Spinal Manipulations;Passive range of motion;Dry needling;Manual techniques;Electrical Stimulation;Moist Heat;Gait training;Stair  training;Traction;Cryotherapy;Therapeutic activities;Balance training;Neuromuscular re-education;Patient/family education    PT Next Visit Plan stair and lifting assessment, HEP review,    PT Home Exercise Plan lumbar traction, DKTC, repeated lumbar flex + lateral flex R, posterior pelvic tilts    Consulted and Agree with Plan of Care Patient             Patient will benefit from skilled therapeutic intervention in order to improve the following deficits and impairments:  Abnormal gait, Decreased activity tolerance, Decreased endurance, Decreased range of motion, Decreased strength, Hypomobility, Pain, Improper body mechanics, Impaired UE functional use, Postural dysfunction, Impaired tone, Impaired flexibility, Increased muscle spasms, Decreased balance, Decreased mobility, Decreased safety awareness, Difficulty walking  Visit Diagnosis: Chronic right-sided low back pain with right-sided sciatica     Problem List 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   Chronic radicular lumbar pain 11/25/2020   Lumbar spondylosis 05/03/2018   Coronary artery disease involving native heart 12/12/2017   Iliac artery stenosis, left ([place]) 12/06/2017   Atherosclerosis of aorta ([place]) 12/06/2017   Chronic bilateral low back pain with bilateral sciatica 10/03/2017   Chronic pain syndrome 10/03/2017   Tobacco use 05/27/2017   Other intervertebral disc degeneration, lumbar region 01/30/2015      Durwin Reges DPT Sharion Settler, SPT  Durwin Reges, PT 03/06/2021, 12:10 PM  [facility] [address] [place], [address] Phone: [phone]   Fax:  [phone]  Name: ONTARIO PETTENGILL MRN: [id number] Date of Birth: [birth year]

## 2021-03-09 ENCOUNTER — Ambulatory Visit: Payer: BC Managed Care – PPO | Admitting: Physical Therapy

## 2021-03-10 ENCOUNTER — Other Ambulatory Visit: Payer: Self-pay

## 2021-03-10 ENCOUNTER — Ambulatory Visit: Payer: BC Managed Care – PPO | Admitting: Physical Therapy

## 2021-03-10 ENCOUNTER — Ambulatory Visit (HOSPITAL_BASED_OUTPATIENT_CLINIC_OR_DEPARTMENT_OTHER): Payer: BC Managed Care – PPO | Admitting: Student in an Organized Health Care Education/Training Program

## 2021-03-10 DIAGNOSIS — M5416 Radiculopathy, lumbar region: Secondary | ICD-10-CM

## 2021-03-11 ENCOUNTER — Ambulatory Visit: Payer: BC Managed Care – PPO | Admitting: Physical Therapy

## 2021-03-11 ENCOUNTER — Encounter: Payer: Self-pay | Admitting: Physical Therapy

## 2021-03-11 DIAGNOSIS — M5441 Lumbago with sciatica, right side: Secondary | ICD-10-CM | POA: Diagnosis not present

## 2021-03-11 DIAGNOSIS — G8929 Other chronic pain: Secondary | ICD-10-CM

## 2021-03-11 NOTE — Therapy (Signed)
[place] [facility] [address] [place], [address] Phone: [phone]   Fax:  [phone]  Physical Therapy Treatment  Patient Details  Name: Wesley Adams MRN: [id number] Date of Birth: 1965-10-23 No data recorded  Encounter Date: 03/11/2021   PT End of Session - 03/11/21 0950     Visit Number 7    Number of Visits 17    Date for PT Re-Evaluation 04/17/21    Authorization Type 19 PT visits thru 04/25/21    Authorization - Visit Number 7    Authorization - Number of Visits 19    Progress Note Due on Visit 10    PT Start Time 0945    PT Stop Time 1025    PT Time Calculation (min) 40 min    Activity Tolerance Patient tolerated treatment well    Behavior During Therapy [facility] for tasks assessed/performed             Past Medical History:  Diagnosis Date   Chronic back pain    Flu    9/19   Spinal cord stimulator status 04/29/2017   [facility] Scientific Spinal Cord Stimulator placed by Dr. Newman Pies 03/23/2017.    Past Surgical History:  Procedure Laterality Date   COLONOSCOPY WITH PROPOFOL N/A 06/01/2017   Procedure: COLONOSCOPY WITH PROPOFOL;  Surgeon: Lin Landsman, MD;  Location: [place];  Service: Endoscopy;  Laterality: N/A;   ESOPHAGOGASTRODUODENOSCOPY (EGD) WITH PROPOFOL  06/01/2017   Procedure: ESOPHAGOGASTRODUODENOSCOPY (EGD) WITH PROPOFOL;  Surgeon: Lin Landsman, MD;  Location: [place];  Service: Endoscopy;;   [place]   he has a rod   LEFT HEART CATH AND CORONARY ANGIOGRAPHY N/A 12/08/2017   Procedure: LEFT HEART CATH AND CORONARY ANGIOGRAPHY;  Surgeon: Corey Skains, MD;  Location: [place] [facility];  Service: Cardiovascular;  Laterality: N/A;   POLYPECTOMY  06/01/2017   Procedure: POLYPECTOMY;  Surgeon: Lin Landsman, MD;  Location: [place];  Service: Endoscopy;;   POSTERIOR LAMINECTOMY /  DECOMPRESSION LUMBAR SPINE  01/30/2015   with fusion, Dr. Arnoldo Morale   SPINAL CORD STIMULATOR INSERTION  03/23/2017   Dr Newman Pies, [facility] Neurosurgery   [place] N/A 06/16/2017   Procedure: SPINAL CORD STIMULATOR REMOVAL;  Surgeon: Newman Pies, MD;  Location: [place];  Service: Neurosurgery;  Laterality: N/A;    There were no vitals filed for this visit.   Subjective Assessment - 03/11/21 0947     Subjective Pt reports increase in pain after rushing in and out of the grocery store yesterday. He feels he has made increased progress with therapy over all. He reports 8/10 pain this visit.    Pertinent History Pt is a 55 year old male presenting with chronic LBP, current episode more R sided with numbness and tingling down posterior RLE to mid calf area. Current pain began July 2019 when he went to lift 50# box while turning to the right to load a package. Pt would like surgical intervention, but is open to conservative measures. Has had 1 epidural injection, and has another today, reports he had minimal pain relief from first. 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 Pain at rest 7/10 and with aggravating events is 10/10. He works full time at a [employer], but has not been doing heavy lifting,  mostly computer work and switching trailers. He would like to be able to get back to full time work, including heavy lifting. Reports his pain is better when he is walking- he can only sit and standing for any period of time, that he has horrible pain at night, especially R side, wakes up every 3 hours to pace his floors. He reports increased pain when he WB more through RLE to step off curb, negotiate steps (has 12 steps to enter/exit work) or if he is turning to the R. Notices pain relief with "hunching over" forward bending.  In addition to working, he is a father of 17 young  children (46,21, and 32 year old twins) that enjoy playing football, soccer, and he has had trouble running and playing with them. PMH L4-5 decompression with fusion and spine stimulator from 2016-2019. Pt denies N/V, B&B changes, unexplained weight fluctuation, saddle paresthesia, fever, night sweats, or unrelenting night pain at this time.    Limitations Lifting;House hold activities    How long can you sit comfortably? 15mins    How long can you stand comfortably? static less than 69mins    How long can you walk comfortably? unlimited    Patient Stated Goals Decrease pain to return to full work duties             Therex   Recumbent Bike L3 x 5 min   TRX Squats 2 x 10 reps with cues for eccentric control and decreased UE reliance   Pallof press GTB 2 x 30 sec each side with cues for anti-rotation and demonstration and verbal cueing needed for good form   Lumbar flexion/Lateral flex with physioball R>L 1  x 12 reps for decreased pain between sets   Bird Dog 1 x 5 reps  10 sec holds each extremity with verbal/tactile cues to achieve/maintain proper form and alignment: patient demonstrated some pelvic rotation with leg lifts  Childs pose 2 x 30 sec with reports of decreased pain                            PT Education - 03/11/21 1217     Education Details therex form/technique    Person(s) Educated Patient    Methods Explanation;Demonstration    Comprehension Verbalized understanding;Returned demonstration              PT Short Term Goals - 02/18/21 1155       PT SHORT TERM GOAL #1   Title Pt will be independent with HEP in order to improve strength and decrease back pain in order to improve pain-free function at home and work.    Baseline 02/18/21 HEP given    Time 4    Period Weeks    Status New               PT Long Term Goals - 02/18/21 1155       PT LONG TERM GOAL #1   Title Pt will decrease worst back pain as reported on NPRS by at  least 2 points in order to demonstrate clinically significant reduction in back pain.    Baseline 02/18/21 10/10    Time 8    Period Weeks    Status New      PT LONG TERM GOAL #2   Title Patient will increase FOTO score to 60 to demonstrate predicted increase in functional mobility to complete ADLs    Baseline 02/18/21 48  Time 8    Period Weeks    Status New      PT LONG TERM GOAL #3   Title Pt will demonstrate 10MWT speed of at least 1.90m/s to demonstrate community ambulation speed    Baseline 02/18/21 0.75m/s    Time 8    Period Weeks    Status New      PT LONG TERM GOAL #4   Title Pt will demonstrate R SLS of at least 17 secs to demonstrate PLOF of LLE static balance to demonstrate decreased fall risk    Baseline 02/18/21 R 3sec L 17sec    Time 8    Period Weeks    Status New                   Plan - 03/11/21 1206     Clinical Impression Statement Pt tolerated session well with no reports of increased pain following therapeutic exercise. Pt continues to have reports of pain but is increasing ability to perform core/LE strengthening. Pt continued progression of exercise, repeated motions, and education on HEP participation. Pt is able to correct form/technique following multimodal cueing.  Continue PT POC as able.    Personal Factors and Comorbidities Age;Comorbidity 1;Profession;Time since onset of injury/illness/exacerbation;Past/Current Experience    Comorbidities chronic LBP    Examination-Activity Limitations Lift;Sit;Stand;Carry;Bed Mobility;Stairs;Squat;Transfers    Examination-Participation Restrictions Community Activity;Laundry;Occupation;Driving;Cleaning;Yard Work    [employer] officer Evolving/Moderate complexity    Clinical Decision Making Moderate    Rehab Potential Good    PT Frequency 2x / week    PT Duration 8 weeks    PT Treatment/Interventions ADLs/Self Care Home Management;Iontophoresis 4mg /ml Dexamethasone;Ultrasound;DME  Instruction;Functional mobility training;Therapeutic exercise;Joint Manipulations;Spinal Manipulations;Passive range of motion;Dry needling;Manual techniques;Electrical Stimulation;Moist Heat;Gait training;Stair training;Traction;Cryotherapy;Therapeutic activities;Balance training;Neuromuscular re-education;Patient/family education    PT Next Visit Plan stair and lifting assessment, HEP review,    PT Home Exercise Plan lumbar traction, DKTC, repeated lumbar flex + lateral flex R, posterior pelvic tilts    Consulted and Agree with Plan of Care Patient             Patient will benefit from skilled therapeutic intervention in order to improve the following deficits and impairments:  Abnormal gait, Decreased activity tolerance, Decreased endurance, Decreased range of motion, Decreased strength, Hypomobility, Pain, Improper body mechanics, Impaired UE functional use, Postural dysfunction, Impaired tone, Impaired flexibility, Increased muscle spasms, Decreased balance, Decreased mobility, Decreased safety awareness, Difficulty walking  Visit Diagnosis: Chronic right-sided low back pain with right-sided sciatica     Problem List 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, [place],  [address] Phone: [phone]   Fax:  [phone]  Name: Wesley Adams MRN: [id number] Date of Birth: June 04, 1965

## 2021-03-11 NOTE — Progress Notes (Signed)
No appt needed

## 2021-03-16 ENCOUNTER — Ambulatory Visit: Payer: BC Managed Care – PPO | Admitting: Physical Therapy

## 2021-03-16 ENCOUNTER — Encounter: Payer: Self-pay | Admitting: Physical Therapy

## 2021-03-16 DIAGNOSIS — M5441 Lumbago with sciatica, right side: Secondary | ICD-10-CM | POA: Diagnosis not present

## 2021-03-16 DIAGNOSIS — G8929 Other chronic pain: Secondary | ICD-10-CM

## 2021-03-16 NOTE — Therapy (Signed)
[place] [facility] [address] [place], [address] Phone: [phone]   Fax:  [phone]  Physical Therapy Treatment  Patient Details  Name: Wesley Adams MRN: [id number] Date of Birth: April 02, 1966 No data recorded  Encounter Date: 03/16/2021   PT End of Session - 03/16/21 1009     Visit Number 8    Number of Visits 17    Date for PT Re-Evaluation 04/17/21    Authorization - Visit Number 8    Authorization - Number of Visits 19    Progress Note Due on Visit 10    PT Start Time 0762    PT Stop Time 1025    PT Time Calculation (min) 38 min    Activity Tolerance Patient tolerated treatment well    Behavior During Therapy [facility] for tasks assessed/performed             Past Medical History:  Diagnosis Date   Chronic back pain    Flu    9/19   Spinal cord stimulator status 04/29/2017   [facility] Scientific Spinal Cord Stimulator placed by Dr. Newman Pies 03/23/2017.    Past Surgical History:  Procedure Laterality Date   COLONOSCOPY WITH PROPOFOL N/A 06/01/2017   Procedure: COLONOSCOPY WITH PROPOFOL;  Surgeon: Lin Landsman, MD;  Location: [place];  Service: Endoscopy;  Laterality: N/A;   ESOPHAGOGASTRODUODENOSCOPY (EGD) WITH PROPOFOL  06/01/2017   Procedure: ESOPHAGOGASTRODUODENOSCOPY (EGD) WITH PROPOFOL;  Surgeon: Lin Landsman, MD;  Location: [place];  Service: Endoscopy;;   [place]   he has a rod   LEFT HEART CATH AND CORONARY ANGIOGRAPHY N/A 12/08/2017   Procedure: LEFT HEART CATH AND CORONARY ANGIOGRAPHY;  Surgeon: Corey Skains, MD;  Location: [place] [facility];  Service: Cardiovascular;  Laterality: N/A;   POLYPECTOMY  06/01/2017   Procedure: POLYPECTOMY;  Surgeon: Lin Landsman, MD;  Location: [place];  Service: Endoscopy;;   POSTERIOR LAMINECTOMY / DECOMPRESSION LUMBAR SPINE  01/30/2015   with fusion, Dr.  Arnoldo Morale   SPINAL CORD STIMULATOR INSERTION  03/23/2017   Dr Newman Pies, [facility] Neurosurgery   [place] N/A 06/16/2017   Procedure: SPINAL CORD STIMULATOR REMOVAL;  Surgeon: Newman Pies, MD;  Location: [place];  Service: Neurosurgery;  Laterality: N/A;    There were no vitals filed for this visit.   Subjective Assessment - 03/16/21 0953     Subjective Pt reports more stiffness this morning than pain. He reports 6/10 pain, thinks this is getting better overall, but that "when his disc rub together" he feels more pain. This pain is random, he cannot pinpoint it to a specific activity.    Pertinent History Pt is a 55 year old male presenting with chronic LBP, current episode more R sided with numbness and tingling down posterior RLE to mid calf area. Current pain began July 2019 when he went to lift 50# box while turning to the right to load a package. Pt would like surgical intervention, but is open to conservative measures. Has had 1 epidural injection, and has another today, reports he had minimal pain relief from first. 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 Pain at rest 7/10 and with aggravating events is 10/10. He works full time at a [employer], but has not been doing heavy lifting, mostly  computer work and switching trailers. He would like to be able to get back to full time work, including heavy lifting. Reports his pain is better when he is walking- he can only sit and standing for any period of time, that he has horrible pain at night, especially R side, wakes up every 3 hours to pace his floors. He reports increased pain when he WB more through RLE to step off curb, negotiate steps (has 12 steps to enter/exit work) or if he is turning to the R. Notices pain relief with "hunching over" forward bending.  In addition to working, he is a father of 59 young  children (24,79, and 36 year old twins) that enjoy playing football, soccer, and he has had trouble running and playing with them. PMH L4-5 decompression with fusion and spine stimulator from 2016-2019. Pt denies N/V, B&B changes, unexplained weight fluctuation, saddle paresthesia, fever, night sweats, or unrelenting night pain at this time.    Limitations Lifting;House hold activities    How long can you sit comfortably? 78mins    How long can you stand comfortably? static less than 80mins    How long can you walk comfortably? unlimited    Patient Stated Goals Decrease pain to return to full work duties    Pain Onset More than a month ago                Therex   Nustep seat 10 UE 10 L3 x 5 min for gentle spine rotation and strengthening. During education on "positive self talk" and the anatomy involved in condition IE his disc are not "rubbing together" and that there can be pain in lieu of harm with good understanding  Lower trunk rotations in hooklying x20 2-3sec holds SKTC 3x 10sec bilat- increased stretch felt at R side of LB with LLE SKTC   Squat 2 x 8 reps with mat table for TC of depth, good carry over of technique following demo and initial cuing   Pallof press anti-rotation 5# 2x 6 with good carry over of cuing for technique, cuing throughout for breath control    L lateral childs pose seated with theraball 2x 30sec hold  Review of the following HEP with education on completing first 3 exercises in the am/before bed, and stretching throughout the day with understanding Access Code: [email or web address] Hooklying Single Knee to Chest Stretch - 2-3 x daily - 7 x weekly - 3 reps - 10sec hold Supine Lower Trunk Rotation - 2-3 x daily - 7 x weekly - 12-20 reps - 2-3sec hold Hooklying Lumbar Traction - 2-3 x daily - 7 x weekly - 10sec hold Seated Child's Pose with Table - 2-3 x daily - 7 x weekly - 30sec hold                         PT Education - 03/16/21 1008      Education Details therex form/technique    Person(s) Educated Patient    Methods Explanation;Demonstration;Verbal cues    Comprehension Verbalized understanding;Returned demonstration;Verbal cues required              PT Short Term Goals - 02/18/21 1155       PT SHORT TERM GOAL #1   Title Pt will be independent with HEP in order to improve strength and decrease back pain in order to improve pain-free function at home and work.    Baseline 02/18/21 HEP given    Time 4  Period Weeks    Status New               PT Long Term Goals - 02/18/21 1155       PT LONG TERM GOAL #1   Title Pt will decrease worst back pain as reported on NPRS by at least 2 points in order to demonstrate clinically significant reduction in back pain.    Baseline 02/18/21 10/10    Time 8    Period Weeks    Status New      PT LONG TERM GOAL #2   Title Patient will increase FOTO score to 60 to demonstrate predicted increase in functional mobility to complete ADLs    Baseline 02/18/21 48    Time 8    Period Weeks    Status New      PT LONG TERM GOAL #3   Title Pt will demonstrate 10MWT speed of at least 1.75m/s to demonstrate community ambulation speed    Baseline 02/18/21 0.91m/s    Time 8    Period Weeks    Status New      PT LONG TERM GOAL #4   Title Pt will demonstrate R SLS of at least 17 secs to demonstrate PLOF of LLE static balance to demonstrate decreased fall risk    Baseline 02/18/21 R 3sec L 17sec    Time 8    Period Weeks    Status New                   Plan - 03/16/21 1016     Clinical Impression Statement PT continued therex progression for increased lumbar/hip mobility, as well as core strengthening, with education pain science and carry over of therex into lifting techniques with success. PT reviewed HEP with patient with specific ensuring understanding of exercises of lumbar motion vs. stretching with good understanding. Patient is able to demonstrate good carry  over of all cuing for proepr therex technique with good motivation throughout session, and is very accepting to pain science education. Patient reports decreased pain to 4/10 following session (from initial 6/10) and that he feels much more mobile- PT encouraged HEP for cotinuance of this. PT will continue progression as able.    Personal Factors and Comorbidities Age;Comorbidity 1;Profession;Time since onset of injury/illness/exacerbation;Past/Current Experience    Comorbidities chronic LBP    Examination-Activity Limitations Lift;Sit;Stand;Carry;Bed Mobility;Stairs;Squat;Transfers    Examination-Participation Restrictions Community Activity;Laundry;Occupation;Driving;Cleaning;Yard Work    [employer] officer Evolving/Moderate complexity    Clinical Decision Making Moderate    Rehab Potential Good    PT Frequency 2x / week    PT Duration 8 weeks    PT Treatment/Interventions ADLs/Self Care Home Management;Iontophoresis 4mg /ml Dexamethasone;Ultrasound;DME Instruction;Functional mobility training;Therapeutic exercise;Joint Manipulations;Spinal Manipulations;Passive range of motion;Dry needling;Manual techniques;Electrical Stimulation;Moist Heat;Gait training;Stair training;Traction;Cryotherapy;Therapeutic activities;Balance training;Neuromuscular re-education;Patient/family education    PT Next Visit Plan stair and lifting assessment, HEP review,    PT Home Exercise Plan lumbar traction, DKTC, repeated lumbar flex + lateral flex R, lateral childs pose    Consulted and Agree with Plan of Care Patient             Patient will benefit from skilled therapeutic intervention in order to improve the following deficits and impairments:  Abnormal gait, Decreased activity tolerance, Decreased endurance, Decreased range of motion, Decreased strength, Hypomobility, Pain, Improper body mechanics, Impaired UE functional use, Postural dysfunction, Impaired tone, Impaired flexibility, Increased  muscle spasms, Decreased balance, Decreased mobility, Decreased safety awareness, Difficulty walking  Visit Diagnosis: Chronic  right-sided low back pain with right-sided sciatica     Problem List 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   Chronic radicular lumbar pain 11/25/2020   Lumbar spondylosis 05/03/2018   Coronary artery disease involving native heart 12/12/2017   Iliac artery stenosis, left ([place]) 12/06/2017   Atherosclerosis of aorta ([place]) 12/06/2017   Chronic bilateral low back pain with bilateral sciatica 10/03/2017   Chronic pain syndrome 10/03/2017   Tobacco use 05/27/2017   Other intervertebral disc degeneration, lumbar region 01/30/2015   Durwin Reges DPT Durwin Reges, PT 03/16/2021, 11:11 AM  [place] [facility] [address], [place], [address] Phone: [phone]   Fax:  [phone]  Name: MATTTHEW ZIOMEK MRN: [id number] Date of Birth: July 20, 1965

## 2021-03-18 ENCOUNTER — Ambulatory Visit: Payer: BC Managed Care – PPO | Admitting: Physical Therapy

## 2021-03-23 ENCOUNTER — Encounter: Payer: Self-pay | Admitting: Physical Therapy

## 2021-03-23 ENCOUNTER — Ambulatory Visit: Payer: BC Managed Care – PPO | Admitting: Physical Therapy

## 2021-03-23 DIAGNOSIS — M5441 Lumbago with sciatica, right side: Secondary | ICD-10-CM | POA: Diagnosis not present

## 2021-03-23 NOTE — Therapy (Signed)
[facility] [address], [place], [address] Phone: [phone]   Fax:  [phone]  Physical Therapy Treatment  Patient Details  Name: Wesley Adams MRN: [id number] Date of Birth: [birth year] No data recorded  Encounter Date: 03/23/2021   PT End of Session - 03/23/21 1046     Visit Number 9    Number of Visits 17    Date for PT Re-Evaluation 04/17/21    Authorization Type 19 PT visits thru 04/25/21    Authorization - Visit Number 9    Authorization - Number of Visits 19    Progress Note Due on Visit 10    PT Start Time 2878    PT Stop Time 1114    PT Time Calculation (min) 39 min    Activity Tolerance Patient tolerated treatment well    Behavior During Therapy [facility] for tasks assessed/performed             Past Medical History:  Diagnosis Date   Chronic back pain    Flu    9/19   Spinal cord stimulator status 04/29/2017   [facility] Scientific Spinal Cord Stimulator placed by Dr. Newman Pies 03/23/2017.    Past Surgical History:  Procedure Laterality Date   COLONOSCOPY WITH PROPOFOL N/A 06/01/2017   Procedure: COLONOSCOPY WITH PROPOFOL;  Surgeon: Lin Landsman, MD;  Location: [place];  Service: Endoscopy;  Laterality: N/A;   ESOPHAGOGASTRODUODENOSCOPY (EGD) WITH PROPOFOL  06/01/2017   Procedure: ESOPHAGOGASTRODUODENOSCOPY (EGD) WITH PROPOFOL;  Surgeon: Lin Landsman, MD;  Location: [place];  Service: Endoscopy;;   Miguel Barrera   he has a rod   LEFT HEART CATH AND CORONARY ANGIOGRAPHY N/A 12/08/2017   Procedure: LEFT HEART CATH AND CORONARY ANGIOGRAPHY;  Surgeon: Corey Skains, MD;  Location: [place] [facility];  Service: Cardiovascular;  Laterality: N/A;   POLYPECTOMY  06/01/2017   Procedure: POLYPECTOMY;  Surgeon: Lin Landsman, MD;  Location: [place];  Service: Endoscopy;;   POSTERIOR LAMINECTOMY /  DECOMPRESSION LUMBAR SPINE  01/30/2015   with fusion, Dr. Arnoldo Morale   SPINAL CORD STIMULATOR INSERTION  03/23/2017   Dr Newman Pies, [facility] Neurosurgery   [place] N/A 06/16/2017   Procedure: SPINAL CORD STIMULATOR REMOVAL;  Surgeon: Newman Pies, MD;  Location: [place];  Service: Neurosurgery;  Laterality: N/A;    There were no vitals filed for this visit.   Subjective Assessment - 03/23/21 1038     Subjective Pt reports last night he was having a lot of LBP 8/10. Is concerned that his steroids and all his injections are wearing off. Reports last night he had "sciatica down his RLE" and today is continuing to have 8/10 pain that is going "all the way down past my knee". Reports he tried his HEP for this pain last night without avail.    Pertinent History Pt is a 55 year old male presenting with chronic LBP, current episode more R sided with numbness and tingling down posterior RLE to mid calf area. Current pain began July 2019 when he went to lift 50# box while turning to the right to load a package. Pt would like surgical intervention, but is open to conservative measures. Has had 1 epidural injection, and has another today, reports he had minimal pain relief from first. Ever since then he reports constant R LBP with RLE tingling/numbness, his n/t can get a little better when he  lays over his outaman at home. Pain at rest 7/10 and with aggravating events is 10/10. 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 He would like to be able to get back to full time work, including heavy lifting. Reports his pain is better when he is walking- he can only sit and standing for any period of time, that he has horrible pain at night, especially R side, wakes up every 3 hours to pace his floors. He reports increased pain when he WB more through RLE to step off curb, negotiate steps (has 12 steps to  enter/exit work) or if he is turning to the R. Notices pain relief with "hunching over" forward bending.  In addition to working, he is a father of 56 young children (40,40, and 21 year old twins) that enjoy playing football, soccer, and he has had trouble running and playing with them. PMH L4-5 decompression with fusion and spine stimulator from 2016-2019. Pt denies N/V, B&B changes, unexplained weight fluctuation, saddle paresthesia, fever, night sweats, or unrelenting night pain at this time.    Limitations Lifting;House hold activities    How long can you sit comfortably? 34mins    How long can you stand comfortably? static less than 58mins    How long can you walk comfortably? unlimited    Patient Stated Goals Decrease pain to return to full work duties    Pain Onset More than a month ago              Therex   Nustep seat 10 UE 10 L3 x 5 min for gentle spine rotation and strengthening   Lower trunk rotations in hooklying x20 2-3sec holds DKTC x45sec hold + side to side rocking x12 SKTC 2x 30 sec bilat good stretch sensation with R SKTC Hooklying self lumbar traction 10sec x10 with max cuing needed for set up and proper technique with good carry over  Repeated seated forward flexion x12 Following 7/10 with centralization to R side of LB   Pallof press anti-rotation 5# 2x 8 with good carry over of cuing for technique, cuing throughout for breath control   Modified childs pose L lateral (seated) for R lumbar paraspinal stretch x45sec                         PT Education - 03/23/21 1046     Education Details therex form/technique    Person(s) Educated Patient    Methods Explanation;Demonstration;Verbal cues    Comprehension Verbalized understanding;Returned demonstration;Verbal cues required              PT Short Term Goals - 02/18/21 1155       PT SHORT TERM GOAL #1   Title Pt will be independent with HEP in order to improve strength and decrease back  pain in order to improve pain-free function at home and work.    Baseline 02/18/21 HEP given    Time 4    Period Weeks    Status New               PT Long Term Goals - 02/18/21 1155       PT LONG TERM GOAL #1   Title Pt will decrease worst back pain as reported on NPRS by at least 2 points in order to demonstrate clinically significant reduction in back pain.    Baseline 02/18/21 10/10    Time  8    Period Weeks    Status New      PT LONG TERM GOAL #2   Title Patient will increase FOTO score to 60 to demonstrate predicted increase in functional mobility to complete ADLs    Baseline 02/18/21 48    Time 8    Period Weeks    Status New      PT LONG TERM GOAL #3   Title Pt will demonstrate 10MWT speed of at least 1.23m/s to demonstrate community ambulation speed    Baseline 02/18/21 0.95m/s    Time 8    Period Weeks    Status New      PT LONG TERM GOAL #4   Title Pt will demonstrate R SLS of at least 17 secs to demonstrate PLOF of LLE static balance to demonstrate decreased fall risk    Baseline 02/18/21 R 3sec L 17sec    Time 8    Period Weeks    Status New                   Plan - 03/23/21 1108     Clinical Impression Statement PT utilized therex for pain modulation with success. Following therex patient reports decreased pain from 8/10 to 7/10 with centralization to back without RLE pain. PT educated pt that these are the exercises that are part of his HEP for when his pain "flares up", offered another printout, patient verbalizes understanding of using his printout from last week. Patient continues to demonstrate  good carry over of all cuing for proper technique of therex with good motivation throughout session. PT will continue progression as able.    Personal Factors and Comorbidities Age;Comorbidity 1;Profession;Time since onset of injury/illness/exacerbation;Past/Current Experience    Comorbidities chronic LBP    Examination-Activity Limitations  Lift;Sit;Stand;Carry;Bed Mobility;Stairs;Squat;Transfers    Examination-Participation Restrictions Community Activity;Laundry;Occupation;Driving;Cleaning;Yard Work    [employer] officer Evolving/Moderate complexity    Clinical Decision Making Moderate    Rehab Potential Good    PT Frequency 2x / week    PT Duration 8 weeks    PT Treatment/Interventions ADLs/Self Care Home Management;Iontophoresis 4mg /ml Dexamethasone;Ultrasound;DME Instruction;Functional mobility training;Therapeutic exercise;Joint Manipulations;Spinal Manipulations;Passive range of motion;Dry needling;Manual techniques;Electrical Stimulation;Moist Heat;Gait training;Stair training;Traction;Cryotherapy;Therapeutic activities;Balance training;Neuromuscular re-education;Patient/family education    PT Next Visit Plan stair and lifting assessment, HEP review,    PT Home Exercise Plan lumbar traction, DKTC, repeated lumbar flex + lateral flex R, lateral childs pose    Consulted and Agree with Plan of Care Patient             Patient will benefit from skilled therapeutic intervention in order to improve the following deficits and impairments:  Abnormal gait, Decreased activity tolerance, Decreased endurance, Decreased range of motion, Decreased strength, Hypomobility, Pain, Improper body mechanics, Impaired UE functional use, Postural dysfunction, Impaired tone, Impaired flexibility, Increased muscle spasms, Decreased balance, Decreased mobility, Decreased safety awareness, Difficulty walking  Visit Diagnosis: Chronic right-sided low back pain with right-sided sciatica     Problem List 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 [place], [address] Phone: [phone]   Fax:  [phone]  Name: Wesley Adams MRN: [id number] Date of Birth: [birth year]

## 2021-03-25 ENCOUNTER — Ambulatory Visit: Payer: BC Managed Care – PPO | Admitting: Physical Therapy

## 2021-03-25 ENCOUNTER — Encounter: Payer: Self-pay | Admitting: Physical Therapy

## 2021-03-25 DIAGNOSIS — M5441 Lumbago with sciatica, right side: Secondary | ICD-10-CM | POA: Diagnosis not present

## 2021-03-25 DIAGNOSIS — G8929 Other chronic pain: Secondary | ICD-10-CM

## 2021-03-25 NOTE — Therapy (Signed)
[facility] [facility] [address] [place], [address] Phone: [phone]   Fax:  [phone]  Physical Therapy Treatment/Progress note Reporting Period 02/18/21 - 03/25/21  Patient Details  Name: Wesley Adams MRN: [id number] Date of Birth: February 20, 1966 No data recorded  Encounter Date: 03/25/2021   PT End of Session - 03/25/21 1020     Visit Number 10    Number of Visits 17    Date for PT Re-Evaluation 04/17/21    Authorization Type 19 PT visits thru 04/25/21    Authorization - Visit Number 10    Authorization - Number of Visits 19    Progress Note Due on Visit 20    PT Start Time 0945    PT Stop Time 2774    PT Time Calculation (min) 38 min    Activity Tolerance Patient tolerated treatment well    Behavior During Therapy [facility] for tasks assessed/performed             Past Medical History:  Diagnosis Date   Chronic back pain    Flu    9/19   Spinal cord stimulator status 04/29/2017   [facility] Scientific Spinal Cord Stimulator placed by Dr. Newman Pies 03/23/2017.    Past Surgical History:  Procedure Laterality Date   COLONOSCOPY WITH PROPOFOL N/A 06/01/2017   Procedure: COLONOSCOPY WITH PROPOFOL;  Surgeon: Lin Landsman, MD;  Location: [place];  Service: Endoscopy;  Laterality: N/A;   ESOPHAGOGASTRODUODENOSCOPY (EGD) WITH PROPOFOL  06/01/2017   Procedure: ESOPHAGOGASTRODUODENOSCOPY (EGD) WITH PROPOFOL;  Surgeon: Lin Landsman, MD;  Location: [place];  Service: Endoscopy;;   [place]   he has a rod   LEFT HEART CATH AND CORONARY ANGIOGRAPHY N/A 12/08/2017   Procedure: LEFT HEART CATH AND CORONARY ANGIOGRAPHY;  Surgeon: Corey Skains, MD;  Location: [place] [facility];  Service: Cardiovascular;  Laterality: N/A;   POLYPECTOMY  06/01/2017   Procedure: POLYPECTOMY;  Surgeon: Lin Landsman, MD;  Location: [place];   Service: Endoscopy;;   POSTERIOR LAMINECTOMY / DECOMPRESSION LUMBAR SPINE  01/30/2015   with fusion, Dr. Arnoldo Morale   SPINAL CORD STIMULATOR INSERTION  03/23/2017   Dr Newman Pies, [facility] Neurosurgery   [place] N/A 06/16/2017   Procedure: SPINAL CORD STIMULATOR REMOVAL;  Surgeon: Newman Pies, MD;  Location: [place];  Service: Neurosurgery;  Laterality: N/A;    There were no vitals filed for this visit.   Subjective Assessment - 03/25/21 0950     Subjective Patient reports he is having some increased LBP with R sided sciatic symptoms since Sunday (was breifly relieved by PT session), but that he is still having a burning sensation in the RLE because of his discs. Patient is very focused on MRI and that his "discs are on his nerves" and strongly correlates his pain to this. His radiating pain is down to his R knee currently and he reports he has tried the exercises from last session that centralized this pain to the back without avail.    Pertinent History Pt is a 55 year old male presenting with chronic LBP, current episode more R sided with numbness and tingling down posterior RLE to mid calf area. Current pain began July 2019 when he went to lift 50# box while turning to the right to load a package. Pt would like surgical intervention, but is open to conservative measures. Has had 1 epidural injection, and  has another today, reports he had minimal pain relief from first. 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 Pain at rest 7/10 and with aggravating events is 10/10. 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 He would like to be able to get back to full time work, including heavy lifting. Reports his pain is better when he is walking- he can only sit and standing for any period of time, that  he has horrible pain at night, especially R side, wakes up every 3 hours to pace his floors. He reports increased pain when he WB more through RLE to step off curb, negotiate steps (has 12 steps to enter/exit work) or if he is turning to the R. Notices pain relief with "hunching over" forward bending.  In addition to working, he is a father of 86 young children (22,74, and 103 year old twins) that enjoy playing football, soccer, and he has had trouble running and playing with them. PMH L4-5 decompression with fusion and spine stimulator from 2016-2019. Pt denies N/V, B&B changes, unexplained weight fluctuation, saddle paresthesia, fever, night sweats, or unrelenting night pain at this time.    Limitations Lifting;House hold activities    How long can you sit comfortably? 33mins    How long can you stand comfortably? static less than 34mins    How long can you walk comfortably? unlimited    Patient Stated Goals Decrease pain to return to full work duties    Pain Onset More than a month ago                 Therex   Nustep seat 10 UE 10 L3 x 5 min for gentle spine rotation and strengthening   Lower trunk rotations in hooklying x20 2-3sec holds DKTC x45sec hold + side to side rocking x12 SKTC 2x 30 sec bilat good stretch sensation with R SKTC Hooklying self lumbar traction with Les on ball 74min; with PT overpressure x10sec on/off x10 Forward flexion with theraball seated with cuing for breath control x12   SLS R: 29sec L 45sec Not billed 10MWT  Continued education patient on pain science and ways to stop perpetuating the pain cycle                       PT Education - 03/25/21 0955     Education Details therex form/technique    Person(s) Educated Patient    Methods Explanation;Demonstration;Verbal cues    Comprehension Verbalized understanding;Returned demonstration;Verbal cues required              PT Short Term Goals - 02/18/21 1155       PT SHORT TERM  GOAL #1   Title Pt will be independent with HEP in order to improve strength and decrease back pain in order to improve pain-free function at home and work.    Baseline 02/18/21 HEP given    Time 4    Period Weeks    Status New               PT Long Term Goals - 03/25/21 0955       PT LONG TERM GOAL #1   Title Pt will decrease worst back pain as reported on NPRS by at least 2 points in order to demonstrate clinically significant reduction  in back pain.    Baseline 02/18/21 10/10; 03/25/21 8/10    Time 8    Period Weeks    Status Achieved      PT LONG TERM GOAL #2   Title Patient will increase FOTO score to 60 to demonstrate predicted increase in functional mobility to complete ADLs    Baseline 02/18/21 48; 03/25/21 44    Time 8    Period Weeks    Status New      PT LONG TERM GOAL #3   Title Pt will demonstrate 10MWT speed of at least 1.33m/s to demonstrate community ambulation speed    Baseline 02/18/21 0.33m/s; 03/25/21 1.36m/s    Time 8    Period Weeks    Status On-going      PT LONG TERM GOAL #4   Title Pt will demonstrate R SLS of at least 17 secs to demonstrate PLOF of LLE static balance to demonstrate decreased fall risk    Baseline 02/18/21 R 3sec L 17sec; 03/25/21 R 29sec L stopped at 45sec    Time 8    Period Weeks    Status Achieved                   Plan - 03/25/21 1036     Clinical Impression Statement PT completed reassessment this session where patient demonstrates great progress in balance surpassing set goal for SLS balance, progress toward gait speed goal, with intermittent progress with pain reduction. Patient demonstrates no progress of subjective function report. PT continued therex progression for pain modulation with success, with continued encouragement toward independence of this. PT continued pain science education as patient is very pain focused and insists that he can feel his discs rubbing on his nerves. PT will continue therex  progression progression for 2 weeks toward remaining goals. PT encouraged patient to follow up with PCP as he would like to seek surgical interventions.    Personal Factors and Comorbidities Age;Comorbidity 1;Profession;Time since onset of injury/illness/exacerbation;Past/Current Experience    Comorbidities chronic LBP    Examination-Activity Limitations Lift;Sit;Stand;Carry;Bed Mobility;Stairs;Squat;Transfers    Examination-Participation Restrictions Community Activity;Laundry;Occupation;Driving;Cleaning;Yard Work    [employer] officer Evolving/Moderate complexity    Clinical Decision Making Moderate    Rehab Potential Good    PT Frequency 2x / week    PT Duration 8 weeks    PT Treatment/Interventions ADLs/Self Care Home Management;Iontophoresis 4mg /ml Dexamethasone;Ultrasound;DME Instruction;Functional mobility training;Therapeutic exercise;Joint Manipulations;Spinal Manipulations;Passive range of motion;Dry needling;Manual techniques;Electrical Stimulation;Moist Heat;Gait training;Stair training;Traction;Cryotherapy;Therapeutic activities;Balance training;Neuromuscular re-education;Patient/family education    PT Next Visit Plan stair and lifting assessment, HEP review,    PT Home Exercise Plan lumbar traction, DKTC, repeated lumbar flex + lateral flex R, lateral childs pose    Consulted and Agree with Plan of Care Patient             Patient will benefit from skilled therapeutic intervention in order to improve the following deficits and impairments:  Abnormal gait, Decreased activity tolerance, Decreased endurance, Decreased range of motion, Decreased strength, Hypomobility, Pain, Improper body mechanics, Impaired UE functional use, Postural dysfunction, Impaired tone, Impaired flexibility, Increased muscle spasms, Decreased balance, Decreased mobility, Decreased safety awareness, Difficulty walking  Visit Diagnosis: Chronic right-sided low back pain with right-sided  sciatica     Problem List 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   Chronic radicular lumbar pain 11/25/2020   Lumbar spondylosis 05/03/2018   Coronary  artery disease involving native heart 12/12/2017   Iliac artery stenosis, left ([facility]) 12/06/2017   Atherosclerosis of aorta ([place]) 12/06/2017   Chronic bilateral low back pain with bilateral sciatica 10/03/2017   Chronic pain syndrome 10/03/2017   Tobacco use 05/27/2017   Other intervertebral disc degeneration, lumbar region 01/30/2015   Wesley Adams DPT Wesley Adams, PT 03/25/2021, 10:43 AM  [facility] [address] [place], [address] Phone: [phone]   Fax:  [phone]  Name: Wesley Adams MRN: [id number] Date of Birth: Apr 26, 1966

## 2021-03-26 DIAGNOSIS — Z419 Encounter for procedure for purposes other than remedying health state, unspecified: Secondary | ICD-10-CM | POA: Diagnosis not present

## 2021-04-01 ENCOUNTER — Ambulatory Visit: Payer: BC Managed Care – PPO | Attending: Neurosurgery | Admitting: Physical Therapy

## 2021-04-01 ENCOUNTER — Encounter: Payer: Self-pay | Admitting: Physical Therapy

## 2021-04-01 DIAGNOSIS — M5441 Lumbago with sciatica, right side: Secondary | ICD-10-CM | POA: Insufficient documentation

## 2021-04-01 DIAGNOSIS — G8929 Other chronic pain: Secondary | ICD-10-CM | POA: Insufficient documentation

## 2021-04-01 NOTE — Therapy (Signed)
[facility] [address], [place], [address] Phone: [phone]   Fax:  [phone]  Physical Therapy Treatment  Patient Details  Name: Wesley Adams MRN: [id number] Date of Birth: 1965-07-08 No data recorded  Encounter Date: 04/01/2021   PT End of Session - 04/01/21 1001     Visit Number 11    Number of Visits 17    Date for PT Re-Evaluation 04/17/21    Authorization Type 19 PT visits thru 04/25/21    Authorization - Visit Number 11    Authorization - Number of Visits 19    PT Start Time 8527    PT Stop Time 7824    PT Time Calculation (min) 39 min    Activity Tolerance Patient tolerated treatment well    Behavior During Therapy [facility] for tasks assessed/performed             Past Medical History:  Diagnosis Date   Chronic back pain    Flu    9/19   Spinal cord stimulator status 04/29/2017   [facility] Scientific Spinal Cord Stimulator placed by Dr. Newman Pies 03/23/2017.    Past Surgical History:  Procedure Laterality Date   COLONOSCOPY WITH PROPOFOL N/A 06/01/2017   Procedure: COLONOSCOPY WITH PROPOFOL;  Surgeon: Lin Landsman, MD;  Location: [place];  Service: Endoscopy;  Laterality: N/A;   ESOPHAGOGASTRODUODENOSCOPY (EGD) WITH PROPOFOL  06/01/2017   Procedure: ESOPHAGOGASTRODUODENOSCOPY (EGD) WITH PROPOFOL;  Surgeon: Lin Landsman, MD;  Location: [place];  Service: Endoscopy;;   [place]   he has a rod   LEFT HEART CATH AND CORONARY ANGIOGRAPHY N/A 12/08/2017   Procedure: LEFT HEART CATH AND CORONARY ANGIOGRAPHY;  Surgeon: Corey Skains, MD;  Location: [place] [facility];  Service: Cardiovascular;  Laterality: N/A;   POLYPECTOMY  06/01/2017   Procedure: POLYPECTOMY;  Surgeon: Lin Landsman, MD;  Location: [place];  Service: Endoscopy;;   POSTERIOR LAMINECTOMY / DECOMPRESSION LUMBAR SPINE  01/30/2015    with fusion, Dr. Arnoldo Morale   SPINAL CORD STIMULATOR INSERTION  03/23/2017   Dr Newman Pies, [facility] Neurosurgery   [place] N/A 06/16/2017   Procedure: SPINAL CORD STIMULATOR REMOVAL;  Surgeon: Newman Pies, MD;  Location: [place];  Service: Neurosurgery;  Laterality: N/A;    There were no vitals filed for this visit.   Subjective Assessment - 04/01/21 0953     Subjective Reports his "discs rubbed together" when he stepped off a curb wrong holding his groceries and nearly fell but caught himself. Reports he completed his HEP and this helped his pain somewhat. Reports the bad step he made off the curb made his sciatica down the RLE all the way down to his knee and is still giving him 9/10 pain. He has tried to do sqautting exercise and lower trunk rotations at home to relief pain without avail. PT reminded patient of exercises for pain relief given to him in a pritout over the past couple weeks, reports he has had too much pain to complete. Pt reports he thinks he is on "defense of having more pain after stepping of the curb wrong".    Pertinent History Pt is a 55 year old male presenting with chronic LBP, current episode more R sided with numbness and tingling down posterior RLE to mid calf area. Current pain began July 2019 when he went to lift 50# box while turning to the  right to load a package. Pt would like surgical intervention, but is open to conservative measures. Has had 1 epidural injection, and has another today, reports he had minimal pain relief from first. 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 Pain at rest 7/10 and with aggravating events is 10/10. 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 He would like to be able to get back to full time work, including heavy lifting. Reports  his pain is better when he is walking- he can only sit and standing for any period of time, that he has horrible pain at night, especially R side, wakes up every 3 hours to pace his floors. He reports increased pain when he WB more through RLE to step off curb, negotiate steps (has 12 steps to enter/exit work) or if he is turning to the R. Notices pain relief with "hunching over" forward bending.  In addition to working, he is a father of 65 young children (66,50, and 59 year old twins) that enjoy playing football, soccer, and he has had trouble running and playing with them. PMH L4-5 decompression with fusion and spine stimulator from 2016-2019. Pt denies N/V, B&B changes, unexplained weight fluctuation, saddle paresthesia, fever, night sweats, or unrelenting night pain at this time.    Limitations Lifting;House hold activities    How long can you sit comfortably? 51mins    How long can you stand comfortably? static less than 54mins    How long can you walk comfortably? unlimited    Patient Stated Goals Decrease pain to return to full work duties    Pain Onset More than a month ago            Therex   Nustep seat 10 UE 10 L3 x 5 min for gentle spine rotation and strengthening. During continued education on the body's ability to heal from normal maladaptive movements of life, and the importance of not perpetuating the pain cycle by having the mindset that the event is going to cause long term pain with understanding   Lower trunk rotations in hooklying x20 2-3sec holds  SKTC 3x 10sec bilat- increased stretch felt at R side of LB with LLE [facility]  Self lumbar traction x5 10sec hold with cuing for directional force with good carry over  Forward theraball rolls 12x 3sec hold; 1x 30sec hold with cuing to prevent breath holding with good carry over   Squat 2 x 8 reps with mat table for TC of depth, good carry over of technique following demo and initial cuing                                  PT Education - 04/01/21 1000     Education Details therex form/technique, pain cycle educaiton, HEP review    Person(s) Educated Patient    Methods Explanation;Demonstration;Verbal cues    Comprehension Verbalized understanding;Returned demonstration;Verbal cues required              PT Short Term Goals - 02/18/21 1155       PT SHORT TERM GOAL #1   Title Pt will be independent with HEP in order to improve strength and decrease back pain in order to improve pain-free function at home and work.  Baseline 02/18/21 HEP given    Time 4    Period Weeks    Status New               PT Long Term Goals - 03/25/21 0955       PT LONG TERM GOAL #1   Title Pt will decrease worst back pain as reported on NPRS by at least 2 points in order to demonstrate clinically significant reduction in back pain.    Baseline 02/18/21 10/10; 03/25/21 8/10    Time 8    Period Weeks    Status Achieved      PT LONG TERM GOAL #2   Title Patient will increase FOTO score to 60 to demonstrate predicted increase in functional mobility to complete ADLs    Baseline 02/18/21 48; 03/25/21 44    Time 8    Period Weeks    Status New      PT LONG TERM GOAL #3   Title Pt will demonstrate 10MWT speed of at least 1.44m/s to demonstrate community ambulation speed    Baseline 02/18/21 0.80m/s; 03/25/21 1.37m/s    Time 8    Period Weeks    Status On-going      PT LONG TERM GOAL #4   Title Pt will demonstrate R SLS of at least 17 secs to demonstrate PLOF of LLE static balance to demonstrate decreased fall risk    Baseline 02/18/21 R 3sec L 17sec; 03/25/21 R 29sec L stopped at 45sec    Time 8    Period Weeks    Status Achieved                   Plan - 04/01/21 1006     Clinical Impression Statement PT continued pain science education on decreasing prepetuation of pain cycle through decreasing thoughts of "harm" with uncomfortable motions,  that it is normal to have pain with maladaptive movements/occurances, but that they can be overcome and the body can heal itself. PT continued to encourage HEP that was given to patient previously for self management of pain, with parameters needed for pain reduction; and that independent pain management, progression of strengthening, and ultimately progress toward d/c of PT to independent HEP is currently limited by compliance of HEP for pain management. Patient verbalizes understading. Patient is able to comply with cuing for therex with good motivation throughout session. Following therex patient reports 7/10 pain and centralization of n/t to hip from posterior knee. PT will continue progression for strengthening as able and pain management as needed.    Personal Factors and Comorbidities Age;Comorbidity 1;Profession;Time since onset of injury/illness/exacerbation;Past/Current Experience    Comorbidities chronic LBP    Examination-Activity Limitations Lift;Sit;Stand;Carry;Bed Mobility;Stairs;Squat;Transfers    Examination-Participation Restrictions Community Activity;Laundry;Occupation;Driving;Cleaning;Yard Work    [employer] Evolving/Moderate complexity    Clinical Decision Making Moderate    Rehab Potential Good    PT Frequency 2x / week    PT Duration 8 weeks    PT Treatment/Interventions ADLs/Self Care Home Management;Iontophoresis 4mg /ml Dexamethasone;Ultrasound;DME Instruction;Functional mobility training;Therapeutic exercise;Joint Manipulations;Spinal Manipulations;Passive range of motion;Dry needling;Manual techniques;Electrical Stimulation;Moist Heat;Gait training;Stair training;Traction;Cryotherapy;Therapeutic activities;Balance training;Neuromuscular re-education;Patient/family education    PT Next Visit Plan stair and lifting assessment, HEP review,    PT Home Exercise Plan lumbar traction, DKTC, repeated lumbar flex + lateral flex R, lateral childs pose     Consulted and Agree with Plan of Care Patient             Patient will benefit from skilled  therapeutic intervention in order to improve the following deficits and impairments:  Abnormal gait, Decreased activity tolerance, Decreased endurance, Decreased range of motion, Decreased strength, Hypomobility, Pain, Improper body mechanics, Impaired UE functional use, Postural dysfunction, Impaired tone, Impaired flexibility, Increased muscle spasms, Decreased balance, Decreased mobility, Decreased safety awareness, Difficulty walking  Visit Diagnosis: Chronic right-sided low back pain with right-sided sciatica     Problem List 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 [place], [address] Phone: [phone]   Fax:  [phone]  Name: Wesley Adams MRN: [id number] Date of Birth: 02-04-1966

## 2021-04-01 NOTE — Progress Notes (Signed)
Name: Wesley Adams   MRN: [id number]    DOB: [birth year]   Date:04/02/2021       Progress Note  Subjective  Chief Complaint  Paperwork  HPI  Lumbar Radiculopathy/Chronic pain syndrome: he has a long history of back pain, he had posterior laminectomy and decompression lumbar spine by Dr. Arnoldo Morale back in 01/2015, after that he got a spinal stimulator in 02/2017 however it was very bulky and bothersome so he had it removed in 05/2017. He is currently having PT and seeing Dr. Holley Raring , he is taking Celebrex, Lyrica , he had two epidural injections with temporary relieve - a few days only. He saw  Dr. Renda Rolls at [facility]  and was referred for PT, he is completing his therapy sessions next week but is still in pain , his employer wants him to go back to work next week but he is still not feeling well. Surgeon has discussed possible fusion at L2-3 . He has daily , constant pain on lumbar spine with burning sensation that is now worse down the right leg , aggravated by movement, worse when lifting, twisting, bending, sitting for prolonged periods of time. No bowel or bladder incontinence. He was trying to work but due to increase in pain he has not been back since 10/27 ( he works at a wearhouse ). He is unable to have a solid night of sleep, has to get up and pace due to pain. He would like Tizanidine refill to see if will help with his muscle spasms.    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   Chronic radicular lumbar pain 11/25/2020   Lumbar spondylosis 05/03/2018   Coronary artery disease involving native heart 12/12/2017   Iliac artery stenosis, left ([place]) 12/06/2017   Atherosclerosis of aorta ([place]) 12/06/2017   Chronic bilateral low back pain with bilateral sciatica 10/03/2017   Chronic pain syndrome 10/03/2017   Tobacco use 05/27/2017   Other intervertebral disc degeneration, lumbar region  01/30/2015    Past Surgical History:  Procedure Laterality Date   COLONOSCOPY WITH PROPOFOL N/A 06/01/2017   Procedure: COLONOSCOPY WITH PROPOFOL;  Surgeon: Lin Landsman, MD;  Location: [place];  Service: Endoscopy;  Laterality: N/A;   ESOPHAGOGASTRODUODENOSCOPY (EGD) WITH PROPOFOL  06/01/2017   Procedure: ESOPHAGOGASTRODUODENOSCOPY (EGD) WITH PROPOFOL;  Surgeon: Lin Landsman, MD;  Location: [place];  Service: Endoscopy;;   [place]   he has a rod   LEFT HEART CATH AND CORONARY ANGIOGRAPHY N/A 12/08/2017   Procedure: LEFT HEART CATH AND CORONARY ANGIOGRAPHY;  Surgeon: Corey Skains, MD;  Location: [place] [facility];  Service: Cardiovascular;  Laterality: N/A;   POLYPECTOMY  06/01/2017   Procedure: POLYPECTOMY;  Surgeon: Lin Landsman, MD;  Location: [place];  Service: Endoscopy;;   POSTERIOR LAMINECTOMY / DECOMPRESSION LUMBAR SPINE  01/30/2015   with fusion, Dr. Arnoldo Morale   SPINAL CORD STIMULATOR INSERTION  03/23/2017   Dr Newman Pies, [facility] Neurosurgery   [place] N/A 06/16/2017   Procedure: SPINAL CORD STIMULATOR REMOVAL;  Surgeon: Newman Pies, MD;  Location: [employer];  Service: Neurosurgery;  Laterality: N/A;    Family History  Problem Relation Age of Onset   Alcohol abuse Mother    Heart attack Father    Heart disease Father    Obesity Father    Heart attack Paternal  Grandfather     Social History   Tobacco Use   Smoking status: Every Day    Packs/day: 1.00    Years: 35.00    Pack years: 35.00    Types: Cigarettes    Start date: 08/02/1983   Smokeless tobacco: Never  Substance Use Topics   Alcohol use: Yes    Alcohol/week: 0.0 standard drinks    Comment: occasionally drinks beer     Current Outpatient Medications:    atorvastatin (LIPITOR) 40 MG tablet, Take 1 tablet (40 mg total) by mouth daily., Disp: 90 tablet, Rfl: 1   celecoxib (CELEBREX) 100  MG capsule, Take 100 mg by mouth 2 (two) times daily., Disp: , Rfl:    lidocaine (LIDODERM) 5 %, See admin instructions., Disp: , Rfl:    pregabalin (LYRICA) 50 MG capsule, Take 1 capsule (50 mg total) by mouth 2 (two) times daily., Disp: 60 capsule, Rfl: 2   tiZANidine (ZANAFLEX) 4 MG tablet, Take 1 tablet (4 mg total) by mouth at bedtime., Disp: 90 tablet, Rfl: 0  No Known Allergies  I personally reviewed active problem list, medication list, allergies, family history, social history, health maintenance with the patient/caregiver today.   ROS  Ten systems reviewed and is negative except as mentioned in HPI   Objective  Vitals:   04/02/21 1100  BP: 140/80  Pulse: 75  Resp: 16  Temp: 98.1 F (36.7 C)  SpO2: 98%  Weight: 198 lb (89.8 kg)  Height: 6\' 1"  (1.854 m)    Body mass index is 26.12 kg/m.  Physical Exam  Constitutional: Patient appears well-developed and well-nourished.  No distress.  HEENT: head atraumatic, normocephalic, pupils equal and reactive to light, neck supple Cardiovascular: Normal rate, regular rhythm and normal heart sounds.  No murmur heard. No BLE edema. Pulmonary/Chest: Effort normal and breath sounds normal. No respiratory distress. Abdominal: Soft.  There is no tenderness. Muscular Skeletal: tender during palpation of lumbar spine, decrease rom , worse with extension, and right lateral bending  Psychiatric: Patient has a normal mood and affect. behavior is normal. Judgment and thought content normal.     PHQ2/9: Depression screen [facility] 2/9 04/02/2021 03/04/2021 11/25/2020 11/20/2020 11/12/2020  Decreased Interest 0 2 0 0 0  Down, Depressed, Hopeless 0 0 0 0 0  PHQ - 2 Score 0 2 0 0 0  Altered sleeping 0 0 - - -  Tired, decreased energy 0 1 - - -  Change in appetite 0 0 - - -  Feeling bad or failure about yourself  0 0 - - -  Trouble concentrating 0 0 - - -  Moving slowly or fidgety/restless 0 0 - - -  Suicidal thoughts 0 0 - - -  PHQ-9 Score 0 3  - - -  Difficult doing work/chores - - - - -  Some recent data might be hidden    phq 9 is negative   Fall Risk: Fall Risk  04/02/2021 03/04/2021 02/18/2021 01/28/2021 11/25/2020  Falls in the past year? 1 1 0 0 0  Number falls in past yr: 0 0 - - -  Injury with Fall? 0 0 - - -  Comment - - - - -  Risk for fall due to : No Fall Risks No Fall Risks - - -  Follow up Falls prevention discussed Falls prevention discussed - - -      Functional Status Survey: Is the patient deaf or have difficulty hearing?: No Does the patient have difficulty  seeing, even when wearing glasses/contacts?: No Does the patient have difficulty concentrating, remembering, or making decisions?: No Does the patient have difficulty walking or climbing stairs?: Yes Does the patient have difficulty dressing or bathing?: No Does the patient have difficulty doing errands alone such as visiting a doctor's office or shopping?: No    Assessment & Plan  1. History of back surgery  Advised him to go back to neurosurgeon since PT and steroid injections are not   2. Chronic radicular lumbar pain   3. Muscle spasm  - tiZANidine (ZANAFLEX) 4 MG tablet; Take 1 tablet (4 mg total) by mouth every 8 (eight) hours as needed for muscle spasms.  Dispense: 90 tablet; Refill: 2

## 2021-04-02 ENCOUNTER — Ambulatory Visit (INDEPENDENT_AMBULATORY_CARE_PROVIDER_SITE_OTHER): Payer: BC Managed Care – PPO | Admitting: Family Medicine

## 2021-04-02 ENCOUNTER — Encounter: Payer: Self-pay | Admitting: Family Medicine

## 2021-04-02 VITALS — BP 140/80 | HR 75 | Temp 98.1°F | Resp 16 | Ht 73.0 in | Wt 198.0 lb

## 2021-04-02 DIAGNOSIS — G8929 Other chronic pain: Secondary | ICD-10-CM | POA: Diagnosis not present

## 2021-04-02 DIAGNOSIS — Z9889 Other specified postprocedural states: Secondary | ICD-10-CM

## 2021-04-02 DIAGNOSIS — M5416 Radiculopathy, lumbar region: Secondary | ICD-10-CM | POA: Diagnosis not present

## 2021-04-02 DIAGNOSIS — M62838 Other muscle spasm: Secondary | ICD-10-CM

## 2021-04-02 MED ORDER — TIZANIDINE HCL 4 MG PO TABS: 4.0000 mg | ORAL_TABLET | Freq: Three times a day (TID) | ORAL | 2 refills | Status: DC | PRN

## 2021-04-03 ENCOUNTER — Encounter: Payer: Self-pay | Admitting: Physical Therapy

## 2021-04-03 ENCOUNTER — Ambulatory Visit: Payer: BC Managed Care – PPO | Admitting: Physical Therapy

## 2021-04-03 ENCOUNTER — Other Ambulatory Visit: Payer: Self-pay

## 2021-04-03 DIAGNOSIS — M5441 Lumbago with sciatica, right side: Secondary | ICD-10-CM | POA: Diagnosis not present

## 2021-04-03 DIAGNOSIS — G8929 Other chronic pain: Secondary | ICD-10-CM

## 2021-04-03 NOTE — Therapy (Signed)
[facility] [address] [place], [address] Phone: [phone]   Fax:  [phone]  Physical Therapy Treatment  Patient Details  Name: Wesley Adams MRN: [id number] Date of Birth: 02/19/1966 No data recorded  Encounter Date: 04/03/2021   PT End of Session - 04/03/21 1112     Visit Number 12    Number of Visits 17    Date for PT Re-Evaluation 04/17/21    Authorization Type 19 PT visits thru 04/25/21    Authorization - Visit Number 12    Authorization - Number of Visits 19    Progress Note Due on Visit 20    PT Start Time 1102    PT Stop Time 1132    PT Time Calculation (min) 30 min    Activity Tolerance Patient tolerated treatment well    Behavior During Therapy [facility] for tasks assessed/performed             Past Medical History:  Diagnosis Date   Chronic back pain    Flu    9/19   Spinal cord stimulator status 04/29/2017   [facility] Scientific Spinal Cord Stimulator placed by Dr. Newman Pies 03/23/2017.    Past Surgical History:  Procedure Laterality Date   COLONOSCOPY WITH PROPOFOL N/A 06/01/2017   Procedure: COLONOSCOPY WITH PROPOFOL;  Surgeon: Lin Landsman, MD;  Location: [place];  Service: Endoscopy;  Laterality: N/A;   ESOPHAGOGASTRODUODENOSCOPY (EGD) WITH PROPOFOL  06/01/2017   Procedure: ESOPHAGOGASTRODUODENOSCOPY (EGD) WITH PROPOFOL;  Surgeon: Lin Landsman, MD;  Location: [place];  Service: Endoscopy;;   [place]   he has a rod   LEFT HEART CATH AND CORONARY ANGIOGRAPHY N/A 12/08/2017   Procedure: LEFT HEART CATH AND CORONARY ANGIOGRAPHY;  Surgeon: Corey Skains, MD;  Location: [place] [facility];  Service: Cardiovascular;  Laterality: N/A;   POLYPECTOMY  06/01/2017   Procedure: POLYPECTOMY;  Surgeon: Lin Landsman, MD;  Location: [place];  Service: Endoscopy;;   POSTERIOR LAMINECTOMY /  DECOMPRESSION LUMBAR SPINE  01/30/2015   with fusion, Dr. Arnoldo Morale   SPINAL CORD STIMULATOR INSERTION  03/23/2017   Dr Newman Pies, [facility]   [place] N/A 06/16/2017   Procedure: SPINAL CORD STIMULATOR REMOVAL;  Surgeon: Newman Pies, MD;  Location: [place];  Service: Neurosurgery;  Laterality: N/A;    There were no vitals filed for this visit.   Subjective Assessment - 04/03/21 1103     Subjective Patient reports he is "going downhill" after stepping off the curb wrong. He reports 9/10 pain this morning radiating to mid R calf. Pt reports he is doing his exercises at home, but does not get the same results as when he completes them with PT. PT inquired if patient has been completing the exact way with the parameters educated on in PT and he says yes. Following last session he reports pain relief until he tried to go to sleep and persisted until today. When inquired about what and how many reps/sets and hold times pt is unable to recall the correct parameters of HEP.    Pertinent History Pt is a 55 year old male presenting with chronic LBP, current episode more R sided with numbness and tingling down posterior RLE to mid calf area. Current pain began July 2019 when he went to lift 50# box while turning to the right to load a package. Pt would like surgical intervention,  but is open to conservative measures. Has had 1 epidural injection, and has another today, reports he had minimal pain relief from first. 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 Pain at rest 7/10 and with aggravating events is 10/10. 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 He would like to be able to get back to full time work, including heavy lifting. Reports his pain is better when he is walking- he can only  sit and standing for any period of time, that he has horrible pain at night, especially R side, wakes up every 3 hours to pace his floors. He reports increased pain when he WB more through RLE to step off curb, negotiate steps (has 12 steps to enter/exit work) or if he is turning to the R. Notices pain relief with "hunching over" forward bending.  In addition to working, he is a father of 75 young children (57,77, and 65 year old twins) that enjoy playing football, soccer, and he has had trouble running and playing with them. PMH L4-5 decompression with fusion and spine stimulator from 2016-2019. Pt denies N/V, B&B changes, unexplained weight fluctuation, saddle paresthesia, fever, night sweats, or unrelenting night pain at this time.    Limitations Lifting;House hold activities    How long can you sit comfortably? 75mins    How long can you stand comfortably? static less than 58mins    How long can you walk comfortably? unlimited    Patient Stated Goals Decrease pain to return to full work duties    Pain Onset More than a month ago              Therex   Nustep seat 10 UE 10 L3 x 5 min for gentle spine rotation and strengthening. During continued education on the body's ability to heal from normal maladaptive movements of life, and the importance of not perpetuating the pain cycle by having the mindset that the event is going to cause long term pain with understanding   Lower trunk rotations in hooklying x20 2-3sec holds   SKTC 3x 10sec bilat- increased stretch felt at R side of LB with LLE [facility]   Self lumbar traction x5 10sec hold with cuing for directional force with good carry over   Forward theraball rolls 12x 3sec hold; 1x 30sec hold with cuing to prevent breath holding with good carry over   Squat 2 x 8 reps 10# DB with mat table for TC of depth, good carry over of technique following demo and initial cuing                              PT Education - 04/03/21  1111     Education Details therex fomr/technique, pain cycle    Person(s) Educated Patient    Methods Explanation;Demonstration;Verbal cues    Comprehension Verbalized understanding;Returned demonstration;Verbal cues required              PT Short Term Goals - 02/18/21 1155       PT SHORT TERM GOAL #1   Title Pt will be independent with HEP in order to improve strength and decrease back pain in order to improve pain-free function at home and work.    Baseline 02/18/21 HEP given    Time  4    Period Weeks    Status New               PT Long Term Goals - 03/25/21 0955       PT LONG TERM GOAL #1   Title Pt will decrease worst back pain as reported on NPRS by at least 2 points in order to demonstrate clinically significant reduction in back pain.    Baseline 02/18/21 10/10; 03/25/21 8/10    Time 8    Period Weeks    Status Achieved      PT LONG TERM GOAL #2   Title Patient will increase FOTO score to 60 to demonstrate predicted increase in functional mobility to complete ADLs    Baseline 02/18/21 48; 03/25/21 44    Time 8    Period Weeks    Status New      PT LONG TERM GOAL #3   Title Pt will demonstrate 10MWT speed of at least 1.25m/s to demonstrate community ambulation speed    Baseline 02/18/21 0.46m/s; 03/25/21 1.42m/s    Time 8    Period Weeks    Status On-going      PT LONG TERM GOAL #4   Title Pt will demonstrate R SLS of at least 17 secs to demonstrate PLOF of LLE static balance to demonstrate decreased fall risk    Baseline 02/18/21 R 3sec L 17sec; 03/25/21 R 29sec L stopped at 45sec    Time 8    Period Weeks    Status Achieved                   Plan - 04/03/21 1114     Clinical Impression Statement Session shortened d/t pt having to pick up children today. PT continued pain science education on how not to perpetuate pain cycle with maladaptive thought process of the step off the curb is going to be a downslide of his progress with minimal  acceptance. PT continued therex for pain modulation with continue success. PT educated patient that for long term pain releif a progression of core strengthening is needed but has been difficult d/t consistent need to manage pain in clinic. PT continued to promote HEP of exercises for pan management with prescribed parameters to allow for independence with pain management, and progression of strengthening in sessions to increase the effectiveness and efficiency of PT. Pt verbalizes understanding again, will hopefully demonstrate better compliance with HEP for self managed pain reduction next visit. PT will continue progression as able.    Personal Factors and Comorbidities Age;Comorbidity 1;Profession;Time since onset of injury/illness/exacerbation;Past/Current Experience    Comorbidities chronic LBP    Examination-Activity Limitations Lift;Sit;Stand;Carry;Bed Mobility;Stairs;Squat;Transfers    Examination-Participation Restrictions Community Activity;Laundry;Occupation;Driving;Cleaning;Yard Work    [employer] Evolving/Moderate complexity    Clinical Decision Making Moderate    Rehab Potential Good    PT Frequency 2x / week    PT Duration 8 weeks    PT Treatment/Interventions ADLs/Self Care Home Management;Iontophoresis 4mg /ml Dexamethasone;Ultrasound;DME Instruction;Functional mobility training;Therapeutic exercise;Joint Manipulations;Spinal Manipulations;Passive range of motion;Dry needling;Manual techniques;Electrical Stimulation;Moist Heat;Gait training;Stair training;Traction;Cryotherapy;Therapeutic activities;Balance training;Neuromuscular re-education;Patient/family education    PT Next Visit Plan pain management, core strengthening progression as able    PT Home Exercise Plan lumbar traction, DKTC, repeated lumbar flex + lateral flex R, lateral childs pose    Consulted and Agree with Plan of Care Patient             Patient will benefit from skilled therapeutic  intervention in order  to improve the following deficits and impairments:  Abnormal gait, Decreased activity tolerance, Decreased endurance, Decreased range of motion, Decreased strength, Hypomobility, Pain, Improper body mechanics, Impaired UE functional use, Postural dysfunction, Impaired tone, Impaired flexibility, Increased muscle spasms, Decreased balance, Decreased mobility, Decreased safety awareness, Difficulty walking  Visit Diagnosis: Chronic right-sided low back pain with right-sided sciatica     Problem List 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 [place], [address] Phone: [phone]   Fax:  [phone]  Name: ROLLIN KOTOWSKI MRN: [id number] Date of Birth: [birth year]

## 2021-04-07 ENCOUNTER — Ambulatory Visit: Payer: BC Managed Care – PPO | Admitting: Physical Therapy

## 2021-04-08 ENCOUNTER — Ambulatory Visit: Payer: BC Managed Care – PPO | Admitting: Physical Therapy

## 2021-04-26 DIAGNOSIS — Z419 Encounter for procedure for purposes other than remedying health state, unspecified: Secondary | ICD-10-CM | POA: Diagnosis not present

## 2021-04-29 ENCOUNTER — Telehealth: Payer: Self-pay

## 2021-04-29 NOTE — Telephone Encounter (Signed)
Pt has enough refills at the moment for Tizanidine.

## 2021-04-30 ENCOUNTER — Other Ambulatory Visit: Payer: Self-pay | Admitting: Family Medicine

## 2021-04-30 DIAGNOSIS — M62838 Other muscle spasm: Secondary | ICD-10-CM

## 2021-05-04 NOTE — Progress Notes (Signed)
Name: Wesley Adams   MRN: [id number]    DOB: October 19, 1965   Date:05/05/2021       Progress Note  Subjective  Chief Complaint  Follow Up- FMLA  HPI  Lumbar Radiculopathy/Chronic pain syndrome: he has a long history of back pain, he had posterior laminectomy and decompression lumbar spine by Dr. Arnoldo Morale back in 01/2015, after that he got a spinal stimulator in 02/2017 however it was very bulky and bothersome so he had it removed in 05/2017. He is currently having PT and seeing Dr. Holley Raring , he is taking Celebrex, Lyrica , he had two epidural injections with temporary relieve - a few days only. He saw  Dr. Renda Rolls at [facility]  and was referred for PT, he completed 19 sessions and also no improvement of symptoms. . Surgeon has discussed possible fusion at L2-3, he is going back to see him January 30 th to see how he is doing. He continues to have daily, constant pain on lumbar spine with burning sensation that is now worse down the right leg , aggravated by movement, worse when lifting, twisting, bending, sitting for prolonged periods of time. No bowel or bladder incontinence. He states since last Thursday the pain has been constant and much  more intense, difficulty even reaching from his chair to the coffee table to pick up a cup of water. He was trying to work but due to increase in pain he has not been back since 10/27 ( he works at a wearhouse ). He needs update on his disability forms, he has a follow up with Dr. Holley Raring in 2 days. His employer is asking him to go back to work but he is still not ready to return to work He states he used to have mild relieve when leaning over on an ottoman but currently any back movement aggravates the pain   Atherosclerosis of aorta and dyslipidemia: he needs refill of Atorvastatin, LDL goal is below 70, we will recheck level today   Patient Active Problem List   Diagnosis Date Noted   History of lumbar fusion (L4/5) 11/25/2020   Failed back surgical syndrome  11/25/2020   Chronic radicular lumbar pain 11/25/2020   Lumbar spondylosis 05/03/2018   Coronary artery disease involving native heart 12/12/2017   Iliac artery stenosis, left ([place]) 12/06/2017   Atherosclerosis of aorta ([place]) 12/06/2017   Chronic bilateral low back pain with bilateral sciatica 10/03/2017   Chronic pain syndrome 10/03/2017   Tobacco use 05/27/2017   Other intervertebral disc degeneration, lumbar region 01/30/2015    Past Surgical History:  Procedure Laterality Date   COLONOSCOPY WITH PROPOFOL N/A 06/01/2017   Procedure: COLONOSCOPY WITH PROPOFOL;  Surgeon: Lin Landsman, MD;  Location: [place];  Service: Endoscopy;  Laterality: N/A;   ESOPHAGOGASTRODUODENOSCOPY (EGD) WITH PROPOFOL  06/01/2017   Procedure: ESOPHAGOGASTRODUODENOSCOPY (EGD) WITH PROPOFOL;  Surgeon: Lin Landsman, MD;  Location: [place];  Service: Endoscopy;;   [place]   he has a rod   LEFT HEART CATH AND CORONARY ANGIOGRAPHY N/A 12/08/2017   Procedure: LEFT HEART CATH AND CORONARY ANGIOGRAPHY;  Surgeon: Corey Skains, MD;  Location: [place] [facility];  Service: Cardiovascular;  Laterality: N/A;   POLYPECTOMY  06/01/2017   Procedure: POLYPECTOMY;  Surgeon: Lin Landsman, MD;  Location: [place];  Service: Endoscopy;;   POSTERIOR LAMINECTOMY / DECOMPRESSION LUMBAR SPINE  01/30/2015   with fusion, Dr. Arnoldo Morale   SPINAL CORD STIMULATOR INSERTION  03/23/2017   Dr Newman Pies, [facility] Neurosurgery   SPINAL CORD STIMULATOR REMOVAL N/A 06/16/2017   Procedure: SPINAL CORD STIMULATOR REMOVAL;  Surgeon: Newman Pies, MD;  Location: [place];  Service: Neurosurgery;  Laterality: N/A;    Family History  Problem Relation Age of Onset   Alcohol abuse Mother    Heart attack Father    Heart disease Father    Obesity Father    Heart attack Paternal Grandfather     Social History   Tobacco Use   Smoking status: Every Day     Packs/day: 1.00    Years: 35.00    Pack years: 35.00    Types: Cigarettes    Start date: 08/02/1983   Smokeless tobacco: Never  Substance Use Topics   Alcohol use: Yes    Alcohol/week: 0.0 standard drinks    Comment: occasionally drinks beer     Current Outpatient Medications:    atorvastatin (LIPITOR) 40 MG tablet, Take 1 tablet (40 mg total) by mouth daily., Disp: 90 tablet, Rfl: 1   celecoxib (CELEBREX) 100 MG capsule, Take 100 mg by mouth 2 (two) times daily., Disp: , Rfl:    pregabalin (LYRICA) 50 MG capsule, Take 1 capsule (50 mg total) by mouth 2 (two) times daily., Disp: 60 capsule, Rfl: 2   tiZANidine (ZANAFLEX) 4 MG tablet, Take 1 tablet (4 mg total) by mouth every 8 (eight) hours as needed for muscle spasms., Disp: 90 tablet, Rfl: 2   lidocaine (LIDODERM) 5 %, See admin instructions. (Patient not taking: Reported on 05/05/2021), Disp: , Rfl:   No Known Allergies  I personally reviewed active problem list, medication list, allergies, family history, social history, health maintenance with the patient/caregiver today.   ROS  Ten systems reviewed and is negative except as mentioned in HPI   Objective  Vitals:   05/05/21 1438  BP: 136/80  Pulse: 74  Resp: 18  Temp: 98.1 F (36.7 C)  TempSrc: Oral  SpO2: 98%  Weight: 203 lb 1.6 oz (92.1 kg)  Height: 6\' 1"  (1.854 m)    Body mass index is 26.8 kg/m.  Physical Exam  Constitutional: Patient appears well-developed and well-nourished. Overweight.Mild distress when moving  HEENT: head atraumatic, normocephalic, pupils equal and reactive to light, neck supple Cardiovascular: Normal rate, regular rhythm and normal heart sounds.  No murmur heard. No BLE edema. Pulmonary/Chest: Effort normal and breath sounds normal. No respiratory distress. Abdominal: Soft.  There is no tenderness. Muscular Skeletal;: tender during palpation of lumbar spine, positive right straight leg raise, antalgic gait  Psychiatric: Patient has  a normal mood and affect. behavior is normal. Judgment and thought content normal.   PHQ2/9: Depression screen [facility] 2/9 05/05/2021 04/02/2021 03/04/2021 11/25/2020 11/20/2020  Decreased Interest 0 0 2 0 0  Down, Depressed, Hopeless 0 0 0 0 0  PHQ - 2 Score 0 0 2 0 0  Altered sleeping 0 0 0 - -  Tired, decreased energy 0 0 1 - -  Change in appetite 0 0 0 - -  Feeling bad or failure about yourself  0 0 0 - -  Trouble concentrating 0 0 0 - -  Moving slowly or fidgety/restless 0 0 0 - -  Suicidal thoughts 0 0 0 - -  PHQ-9 Score 0 0 3 - -  Difficult doing work/chores - - - - -  Some recent data might be hidden    phq 9 is negative   Fall Risk: Fall Risk  05/05/2021 04/02/2021  03/04/2021 02/18/2021 01/28/2021  Falls in the past year? 0 1 1 0 0  Number falls in past yr: - 0 0 - -  Injury with Fall? - 0 0 - -  Comment - - - - -  Risk for fall due to : - No Fall Risks No Fall Risks - -  Follow up Falls prevention discussed Falls prevention discussed Falls prevention discussed - -    Assessment & Plan  1. History of back surgery  Keep follow up with pain clinic and neurosurgeon, symptoms are getting worse   2. Chronic radicular lumbar pain  Getting worse   3. Muscle spasm  Continue Tizanidine  4. Pure hypercholesterolemia  - atorvastatin (LIPITOR) 40 MG tablet; Take 1 tablet (40 mg total) by mouth daily.  Dispense: 90 tablet; Refill: 1 - Lipid panel - Hepatic function panel  5. Atherosclerosis of aorta (H[facility])  - atorvastatin (LIPITOR) 40 MG tablet; Take 1 tablet (40 mg total) by mouth daily.  Dispense: 90 tablet; Refill: 1 - Lipid panel - Hepatic function panel

## 2021-05-05 ENCOUNTER — Encounter: Payer: Self-pay | Admitting: Family Medicine

## 2021-05-05 ENCOUNTER — Ambulatory Visit (INDEPENDENT_AMBULATORY_CARE_PROVIDER_SITE_OTHER): Payer: BC Managed Care – PPO | Admitting: Family Medicine

## 2021-05-05 VITALS — BP 136/80 | HR 74 | Temp 98.1°F | Resp 18 | Ht 73.0 in | Wt 203.1 lb

## 2021-05-05 DIAGNOSIS — M62838 Other muscle spasm: Secondary | ICD-10-CM

## 2021-05-05 DIAGNOSIS — E7800 Pure hypercholesterolemia, unspecified: Secondary | ICD-10-CM

## 2021-05-05 DIAGNOSIS — Z9889 Other specified postprocedural states: Secondary | ICD-10-CM | POA: Diagnosis not present

## 2021-05-05 DIAGNOSIS — I700 Atherosclerosis of aorta: Secondary | ICD-10-CM

## 2021-05-05 DIAGNOSIS — M5416 Radiculopathy, lumbar region: Secondary | ICD-10-CM

## 2021-05-05 DIAGNOSIS — G8929 Other chronic pain: Secondary | ICD-10-CM

## 2021-05-05 LAB — HEPATIC FUNCTION PANEL
AG Ratio: 2.2 (calc) (ref 1.0–2.5)
ALT: 8 U/L — ABNORMAL LOW (ref 9–46)
AST: 13 U/L (ref 10–35)
Albumin: 4.3 g/dL (ref 3.6–5.1)
Alkaline phosphatase (APISO): 61 U/L (ref 35–144)
Bilirubin, Direct: 0.1 mg/dL (ref 0.0–0.2)
Globulin: 2 g/dL (calc) (ref 1.9–3.7)
Indirect Bilirubin: 0.2 mg/dL (calc) (ref 0.2–1.2)
Total Bilirubin: 0.3 mg/dL (ref 0.2–1.2)
Total Protein: 6.3 g/dL (ref 6.1–8.1)

## 2021-05-05 LAB — LIPID PANEL
Cholesterol: 168 mg/dL (ref <200)
HDL: 46 mg/dL (ref >=40)
LDL Cholesterol (Calc): 104 mg/dL (calc) — ABNORMAL HIGH
Non-HDL Cholesterol (Calc): 122 mg/dL (calc) (ref <130)
Total CHOL/HDL Ratio: 3.7 (calc) (ref <5.0)
Triglycerides: 90 mg/dL (ref <150)

## 2021-05-05 MED ORDER — ATORVASTATIN CALCIUM 40 MG PO TABS: 40.0000 mg | ORAL_TABLET | Freq: Every day | ORAL | 1 refills | Status: DC

## 2021-05-07 ENCOUNTER — Ambulatory Visit
Admission: RE | Admit: 2021-05-07 | Discharge: 2021-05-07 | Disposition: A | Discharge status: Home / Self Care | Payer: BC Managed Care – PPO | Source: Ambulatory Visit | Attending: Student in an Organized Health Care Education/Training Program | Admitting: Student in an Organized Health Care Education/Training Program

## 2021-05-07 ENCOUNTER — Other Ambulatory Visit: Payer: Self-pay

## 2021-05-07 ENCOUNTER — Encounter: Payer: Self-pay | Admitting: Student in an Organized Health Care Education/Training Program

## 2021-05-07 ENCOUNTER — Ambulatory Visit (HOSPITAL_BASED_OUTPATIENT_CLINIC_OR_DEPARTMENT_OTHER): Payer: BC Managed Care – PPO | Admitting: Student in an Organized Health Care Education/Training Program

## 2021-05-07 VITALS — BP 151/85 | HR 65 | Temp 97.1°F | Resp 16 | Ht 73.0 in | Wt 203.0 lb

## 2021-05-07 DIAGNOSIS — G8929 Other chronic pain: Secondary | ICD-10-CM

## 2021-05-07 DIAGNOSIS — M48061 Spinal stenosis, lumbar region without neurogenic claudication: Secondary | ICD-10-CM | POA: Insufficient documentation

## 2021-05-07 DIAGNOSIS — M5416 Radiculopathy, lumbar region: Secondary | ICD-10-CM

## 2021-05-07 MED ORDER — PREGABALIN 75 MG PO CAPS: 75.0000 mg | ORAL_CAPSULE | Freq: Two times a day (BID) | ORAL | 0 refills | Status: DC

## 2021-05-07 NOTE — Progress Notes (Signed)
PROVIDER NOTE: Information contained herein reflects review and annotations entered in association with encounter. Interpretation of such information and data should be left to medically-trained personnel. Information provided to patient can be located elsewhere in the medical record under "Patient Instructions". Document created using STT-dictation technology, any transcriptional errors that may result from process are unintentional.    Patient: Wesley Adams  Service Category: E/M  Provider: Gillis Santa, MD  DOB: [birth year]  DOS: 05/07/2021  Specialty: Interventional Pain Management  MRN: [id number]  Setting: Ambulatory outpatient  PCP: Steele Sizer, MD  Type: Established Patient    Referring Provider: Steele Sizer, MD  Location: Office  Delivery: Face-to-face     HPI  [name], a 56 y.o. year old male, is here today because of his Lumbar radiculopathy [M54.16]. [name] primary complain today is Back Pain   Pertinent problems: Wesley Adams has Chronic bilateral low back pain with bilateral sciatica; Chronic pain syndrome; Lumbar spondylosis; History of lumbar fusion (L4/5); Failed back surgical syndrome; and Lumbar radiculopathy on their pertinent problem list. Pain Assessment: Severity of Chronic pain is reported as a 10-Worst pain ever/10. Location: Back Lower/radiates down both legs to knees on the back and side. Onset: More than a month ago. Quality:  (pinching). Timing: Constant. Modifying factor(s): nothinig. Vitals:  height is 6' 1"  (1.854 m) and weight is 203 lb (92.1 kg). His temperature is 97.1 F (36.2 C) (abnormal). His blood pressure is 151/85 (abnormal) and his pulse is 65. His respiration is 16 and oxygen saturation is 100%.   Reason for encounter: worsening of previously known (established) problem   Wesley Adams presents today with an increase in his low back pain with radiation into his right buttock and right lateral thigh.   Of note patient has a history  of L4-L5 decompression, instrumentation, fusion that was done in 2016 with Dr. Arnoldo Morale.  He is status post spinal cord stimulator implant which was not beneficial and subsequently removed in 2019.  Patient states that towards the end of July when he was working at the [employer], he bent over to pick up a box that was approximately 60 pounds noticed acute onset of left lower back pain that radiated into his left leg at that time.  He has been having increased lower extremity neuropathic pain since then and today it is worse on the right side.  He has tried a prednisone taper in the past with his PCP.  He is unable to sleep and states that his pain has been worsening.  He has tried physical therapy in the past, having completed 18 sessions recently   He was on gabapentin which was increased to 600 mg nightly and that was not helpful so he was transition to Lyrica which she is taking at 50 mg twice a day currently.Marland Kitchen  He states that it was initially providing him with pain relief but no longer.  He is unable to work secondary to increased right leg pain.  He has an upcoming appointment with surgery later this month.  We have tried to epidural steroid injections in the past, initially at right L2-L3 interlaminar followed by a right transforaminal L5 and S1 injection.  Given that he is having increased radiating right hip pain, discussed right transforaminal L3 epidural steroid injection for symptomatic management until he is able to establish operative plan with surgeon.   ROS  Constitutional: Denies any fever or chills Gastrointestinal: No reported hemesis, hematochezia, vomiting, or acute GI distress Musculoskeletal:  LBP + R>L leg pain Neurological: No reported episodes of acute onset apraxia, aphasia, dysarthria, agnosia, amnesia, paralysis, loss of coordination, or loss of consciousness  Medication Review  atorvastatin, celecoxib, pregabalin, and tiZANidine  History Review  Allergy: Wesley Adams has  No Known Allergies. Drug: Wesley Adams  reports no history of drug use. Alcohol:  reports current alcohol use. Tobacco:  reports that he has been smoking cigarettes. He started smoking about 37 years ago. He has a 35.00 pack-year smoking history. He has never used smokeless tobacco. Social: Wesley Adams  reports that he has been smoking cigarettes. He started smoking about 37 years ago. He has a 35.00 pack-year smoking history. He has never used smokeless tobacco. He reports current alcohol use. He reports that he does not use drugs. Medical:  has a past medical history of Chronic back pain, Flu, and Spinal cord stimulator status (04/29/2017). Surgical: Wesley Adams  has a past surgical history that includes Spinal cord stimulator insertion (03/23/2017); Colonoscopy with propofol (N/A, 06/01/2017); Esophagogastroduodenoscopy (egd) with propofol (06/01/2017); polypectomy (06/01/2017); Spinal cord stimulator removal (N/A, 06/16/2017); LEFT HEART CATH AND CORONARY ANGIOGRAPHY (N/A, 12/08/2017); Posterior laminectomy / decompression lumbar spine (01/30/2015); and Femur fracture surgery (Right, 1985). Family: family history includes Alcohol abuse in his mother; Heart attack in his father and paternal grandfather; Heart disease in his father; Obesity in his father.  Laboratory Chemistry Profile   Renal Lab Results  Component Value Date   BUN 14 11/12/2020   CREATININE 1.02 16/38/4536   BCR NOT APPLICABLE 46/80/3212   GFRAA 118 01/02/2018   GFRNONAA 102 01/02/2018    Hepatic Lab Results  Component Value Date   AST 13 05/05/2021   ALT 8 (L) 05/05/2021   ALBUMIN 4.4 12/06/2017   ALKPHOS 64 12/06/2017   LIPASE 41 12/06/2017    Electrolytes Lab Results  Component Value Date   NA 141 11/12/2020   K 4.0 11/12/2020   CL 107 11/12/2020   CALCIUM 9.6 11/12/2020    Bone No results found for: VD25OH, VD125OH2TOT, YQ8250IB7, CW8889VQ9, 25OHVITD1, 25OHVITD2, 25OHVITD3, TESTOFREE, TESTOSTERONE   Inflammation (CRP: Acute Phase) (ESR: Chronic Phase) No results found for: CRP, ESRSEDRATE, LATICACIDVEN       Note: Above Lab results reviewed.  Recent Imaging Review  DG Lumbar Spine Complete W/Bend CLINICAL DATA:  Chronic low back pain. Multiple previous back surgeries.  EXAM: LUMBAR SPINE - COMPLETE WITH BENDING VIEWS  COMPARISON:  03/16/2019; CT abdomen and pelvis - 01/23/2018  FINDINGS: There are 5 non rib-bearing lumbar type vertebral bodies.  Moderate scoliotic curvature of the thoracolumbar spine with dominant caudal component convex to the right measuring approximately 19 degrees (as measured from the superior endplate of I50 to the superior endplate of L5), potentially progressed compared to remote lumbar spine radiographs performed 02/2019, previously, 8 degrees, though potentially accentuated due to positioning.  Minimal (approximately 4 mm) of retrolisthesis of L2 upon L3. The amount of retrolisthesis is unchanged given the acquired degrees of flexion and extension no additional areas of anterolisthesis or retrolisthesis are elicited given the acquired degrees of flexion and extension.  No definite pars defects.  Post L4-L5 paraspinal fusion intervertebral disc space replacement without definitive evidence of hardware loosening, though note, evaluation degraded secondary to large field of view.  Lumbar vertebral body heights appear preserved given obliquity.  Mild-to-moderate multilevel lumbar spine DDD, worse at L1-L2 with disc space height loss endplate irregularity and sclerosis.  Limited visualization of the bilateral SI joints is normal.  Interval removal of  right femoral intramedullary rod with multiple peripherally corticated ossicles about the greater trochanter.  Atherosclerotic plaque within the abdominal aorta.  IMPRESSION: 1. Mild-to-moderate multilevel lumbar spine DDD, worse at L1-L2, progressed compared to the 02/2019 examination 2.  Suspected progression of scoliotic curvature, now with dominant caudal component measuring 19 degrees, convex to the right. 3.  Aortic Atherosclerosis (ICD10-I70.0).  Electronically Signed   By: Sandi Mariscal M.D.   On: 05/07/2021 10:17  Note: Reviewed        Physical Exam  General appearance: Well nourished, well developed, and well hydrated. In no apparent acute distress Mental status: Alert, oriented x 3 (person, place, & time)       Respiratory: No evidence of acute respiratory distress Eyes: PERLA Vitals: BP (!) 151/85 (BP Location: Left Arm, Patient Position: Sitting, Cuff Size: Normal)    Pulse 65    Temp (!) 97.1 F (36.2 C)    Resp 16    Ht 6' 1"  (1.854 m)    Wt 203 lb (92.1 kg)    SpO2 100%    BMI 26.78 kg/m  BMI: Estimated body mass index is 26.78 kg/m as calculated from the following:   Height as of this encounter: 6' 1"  (1.854 m).   Weight as of this encounter: 203 lb (92.1 kg). Ideal: Ideal body weight: 79.9 kg (176 lb 2.4 oz) Adjusted ideal body weight: 84.8 kg (186 lb 14.2 oz)  Lumbar Spine Area Exam  Skin & Axial Inspection: Well healed scar from previous spine surgery detected Alignment: Symmetrical Functional ROM: Decreased ROM       Stability: No instability detected Muscle Tone/Strength: Functionally intact. No obvious neuro-muscular anomalies detected. Sensory (Neurological): Dermatomal pain pattern right L3, L4 Palpation: Complains of area being tender to palpation       Provocative Tests: Lumbar Hyperextension and rotation test: Positive due to fusion restriction. Lumbar Lateral bending test: Positive ipsilateral radicular pain, bilaterally. Positive for bilateral foraminal stenosis. Patrick's Maneuver: evaluation deferred today                     Gait & Posture Assessment  Ambulation: Unassisted Gait: Relatively normal for age and body habitus Posture: WNL    Lower Extremity Exam      Side: Right lower extremity   Side: Left lower extremity  Skin &  Extremity Inspection: Skin color, temperature, and hair growth are WNL. No peripheral edema or cyanosis. No masses, redness, swelling, asymmetry, or associated skin lesions. No contractures.   Skin & Extremity Inspection: Skin color, temperature, and hair growth are WNL. No peripheral edema or cyanosis. No masses, redness, swelling, asymmetry, or associated skin lesions. No contractures.  Functional ROM: Unrestricted ROM           Functional ROM: Unrestricted ROM          Muscle Tone/Strength: Functionally intact. No obvious neuro-muscular anomalies detected.   Muscle Tone/Strength: Functionally intact. No obvious neuro-muscular anomalies detected.  Sensory (Neurological): Unimpaired   Sensory (Neurological): Unimpaired  Palpation: No palpable anomalies   Palpation: No palpable anomalies    Assessment   Status Diagnosis  Persistent Persistent Persistent 1. Lumbar radiculopathy   2. Chronic radicular lumbar pain   3. 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   [name] has a current medication  list which includes the following long-term medication(s): atorvastatin and pregabalin.  Pharmacotherapy (Medications Ordered): Meds ordered this encounter  Medications   pregabalin (LYRICA) 75 MG capsule    Sig: Take 1 capsule (75 mg total) by mouth 2 (two) times daily.    Dispense:  90 capsule    Refill:  0    Fill one day early if pharmacy is closed on scheduled refill date. May substitute for generic if available.   Orders:  Orders Placed This Encounter  Procedures   Lumbar Transforaminal Epidural    Standing Status:   Future    Standing Expiration Date:   06/07/2021    Scheduling Instructions:     Side: RIGHT L3     Timeframe: ASAP    Order Specific Question:   Where will this  procedure be performed?    Answer:   [facility]   DG Lumbar Spine Complete W/Bend    Patient presents with axial pain with possible radicular component. Please assist [place] in identifying specific level(s) and laterality of any additional findings such as: 1. Facet (Zygapophyseal) joint DJD (Hypertrophy, space narrowing, subchondral sclerosis, and/or osteophyte formation) 2. DDD and/or IVDD (Loss of disc height, desiccation, gas patterns, osteophytes, endplate sclerosis, or "Black disc disease") 3. Pars defects 4. Spondylolisthesis, spondylosis, and/or spondyloarthropathies (include Degree/Grade of displacement in mm) (stability) 5. Vertebral body Fractures (acute/chronic) (state percentage of collapse) 6. Demineralization (osteopenia/osteoporotic) 7. Bone pathology 8. Foraminal narrowing  9. Surgical changes    Standing Status:   Future    Number of Occurrences:   1    Standing Expiration Date:   06/07/2021    Scheduling Instructions:     Imaging must be done as soon as possible. Inform patient that order will expire within 30 days and I will not renew it.    Order Specific Question:   Reason for Exam (SYMPTOM  OR DIAGNOSIS REQUIRED)    Answer:   Low back pain    Order Specific Question:   Preferred imaging location?    Answer:   [facility]    Order Specific Question:   Call Results- Best Contact Number?    Answer:   [phone] ([facility])    Order Specific Question:   Radiology Contrast Protocol - do NOT remove file path    Answer:   [email or web address]    Order Specific Question:   Release to patient    Answer:   Immediate   Follow-up plan:   Return in about 6 days (around 05/13/2021) for R L3 TF ESI , minimal sedation (PO Valium).        Recent Visits Date Type Provider Dept  03/04/21 Office Visit Gillis Santa, MD [facility]  02/18/21 Procedure visit Gillis Santa, MD [facility]  02/16/21  Office Visit Gillis Santa, MD [facility]  Showing recent visits within past 90 days and meeting all other requirements Today's Visits Date Type Provider Dept  05/07/21 Office Visit Gillis Santa, MD [facility]  Showing today's visits and meeting all other requirements Future Appointments No visits were found meeting these conditions. Showing future appointments within next 90 days and meeting all other requirements  I discussed the assessment and treatment plan with the patient. The patient was provided an opportunity to ask questions and all were answered. The patient agreed with the plan and demonstrated an understanding of the instructions.  Patient advised to call back or seek an in-person evaluation if the symptoms or condition worsens.  Duration of encounter: 77mnutes.  Note by: BGillis Santa MD Date: 05/07/2021; Time: 10:32 AM

## 2021-05-07 NOTE — Progress Notes (Signed)
Safety precautions to be maintained throughout the outpatient stay will include: orient to surroundings, keep bed in low position, maintain call bell within reach at all times, provide assistance with transfer out of bed and ambulation.  

## 2021-05-07 NOTE — Patient Instructions (Signed)

## 2021-05-07 NOTE — Progress Notes (Deleted)
PROVIDER NOTE: Information contained herein reflects review and annotations entered in association with encounter. Interpretation of such information and data should be left to medically-trained personnel. Information provided to patient can be located elsewhere in the medical record under "Patient Instructions". Document created using STT-dictation technology, any transcriptional errors that may result from process are unintentional.    Patient: Wesley Adams  Service Category: E/M  Provider: Gillis Santa, MD  DOB: 03/15/1966  DOS: 05/07/2021  Specialty: Interventional Pain Management  MRN: [id number]  Setting: Ambulatory outpatient  PCP: Steele Sizer, MD  Type: Established Patient    Referring Provider: Steele Sizer, MD  Location: Office  Delivery: Face-to-face     HPI  [name], a 56 y.o. year old male, is here today because of his Lumbar radiculopathy [M54.16]. [name] primary complain today is Back Pain Last encounter: My last encounter with him was on 03/04/2021. Pertinent problems: [name] has Chronic bilateral low back pain with bilateral sciatica; Chronic pain syndrome; Lumbar spondylosis; History of lumbar fusion (L4/5); Failed back surgical syndrome; and Lumbar radiculopathy on their pertinent problem list. Pain Assessment: Severity of Chronic pain is reported as a 10-Worst pain ever/10. Location: Back Lower/radiates down both legs to knees on the back and side. Onset: More than a month ago. Quality:  (pinching). Timing: Constant. Modifying factor(s): nothinig. Vitals:  height is $RemoveB'6\' 1"'YCZeIqBd$  (1.854 m) and weight is 203 lb (92.1 kg). His temperature is 97.1 F (36.2 C) (abnormal). His blood pressure is 151/85 (abnormal) and his pulse is 65. His respiration is 16 and oxygen saturation is 100%.   Reason for encounter:  *** .  ***  Pharmacotherapy Assessment  Analgesic: {There is no content from the last Subjective section.}   Monitoring:  PMP: PDMP not  reviewed this encounter.       Pharmacotherapy: No side-effects or adverse reactions reported. Compliance: No problems identified. Effectiveness: Clinically acceptable.  Dewayne Shorter, RN  05/07/2021  8:38 AM  Sign when Signing Visit Safety precautions to be maintained throughout the outpatient stay will include: orient to surroundings, keep bed in low position, maintain call bell within reach at all times, provide assistance with transfer out of bed and ambulation.    UDS:  Summary  Date Value Ref Range Status  05/03/2018 FINAL  Final    Comment:    ==================================================================== TOXASSURE SELECT 13 (MW) ==================================================================== Test                             Result       Flag       Units   NO DRUGS DETECTED. ==================================================================== Test                      Result    Flag   Units      Ref Range   Creatinine              256              mg/dL      >=20 ==================================================================== Declared Medications:  The flagging and interpretation on this report are based on the  following declared medications.  Unexpected results may arise from  inaccuracies in the declared medications.  **Note: The testing scope of this panel does not include following  reported medications:  Acetaminophen (Tylenol)  Aspirin  Atorvastatin (Lipitor)  Gabapentin (Neurontin) ==================================================================== For clinical consultation, please call [phone]. ====================================================================  ROS  Constitutional: Denies any fever or chills Gastrointestinal: No reported hemesis, hematochezia, vomiting, or acute GI distress Musculoskeletal: Denies any acute onset joint swelling, redness, loss of ROM, or weakness Neurological: No reported episodes of acute onset  apraxia, aphasia, dysarthria, agnosia, amnesia, paralysis, loss of coordination, or loss of consciousness  Medication Review  atorvastatin, celecoxib, pregabalin, and tiZANidine  History Review  Allergy: [name] has No Known Allergies. Drug: [name]  reports no history of drug use. Alcohol:  reports current alcohol use. Tobacco:  reports that he has been smoking cigarettes. He started smoking about 37 years ago. He has a 35.00 pack-year smoking history. He has never used smokeless tobacco. Social: [name]  reports that he has been smoking cigarettes. He started smoking about 37 years ago. He has a 35.00 pack-year smoking history. He has never used smokeless tobacco. He reports current alcohol use. He reports that he does not use drugs. Medical:  has a past medical history of Chronic back pain, Flu, and Spinal cord stimulator status (04/29/2017). Surgical: [name]  has a past surgical history that includes Spinal cord stimulator insertion (03/23/2017); Colonoscopy with propofol (N/A, 06/01/2017); Esophagogastroduodenoscopy (egd) with propofol (06/01/2017); polypectomy (06/01/2017); Spinal cord stimulator removal (N/A, 06/16/2017); LEFT HEART CATH AND CORONARY ANGIOGRAPHY (N/A, 12/08/2017); Posterior laminectomy / decompression lumbar spine (01/30/2015); and Femur fracture surgery (Right, 1985). Family: family history includes Alcohol abuse in his mother; Heart attack in his father and paternal grandfather; Heart disease in his father; Obesity in his father.  Laboratory Chemistry Profile   Renal Lab Results  Component Value Date   BUN 14 11/12/2020   CREATININE 1.02 86/76/7209   BCR NOT APPLICABLE 47/12/6281   GFRAA 118 01/02/2018   GFRNONAA 102 01/02/2018    Hepatic Lab Results  Component Value Date   AST 13 05/05/2021   ALT 8 (L) 05/05/2021   ALBUMIN 4.4 12/06/2017   ALKPHOS 64 12/06/2017   LIPASE 41 12/06/2017    Electrolytes Lab Results  Component Value Date    NA 141 11/12/2020   K 4.0 11/12/2020   CL 107 11/12/2020   CALCIUM 9.6 11/12/2020    Bone No results found for: VD25OH, VD125OH2TOT, MO2947ML4, YT0354SF6, 25OHVITD1, 25OHVITD2, 25OHVITD3, TESTOFREE, TESTOSTERONE  Inflammation (CRP: Acute Phase) (ESR: Chronic Phase) No results found for: CRP, ESRSEDRATE, LATICACIDVEN       Note: Above Lab results reviewed.  Recent Imaging Review  DG PAIN CLINIC C-ARM 1-60 MIN NO REPORT Fluoro was used, but no Radiologist interpretation will be provided.  Please refer to "NOTES" tab for provider progress note. Note: Reviewed        Physical Exam  General appearance: Well nourished, well developed, and well hydrated. In no apparent acute distress Mental status: Alert, oriented x 3 (person, place, & time)       Respiratory: No evidence of acute respiratory distress Eyes: PERLA Vitals: BP (!) 151/85 (BP Location: Left Arm, Patient Position: Sitting, Cuff Size: Normal)    Pulse 65    Temp (!) 97.1 F (36.2 C)    Resp 16    Ht $R'6\' 1"'An$  (1.854 m)    Wt 203 lb (92.1 kg)    SpO2 100%    BMI 26.78 kg/m  BMI: Estimated body mass index is 26.78 kg/m as calculated from the following:   Height as of this encounter: $RemoveBeforeD'6\' 1"'kuhRrEAPPJdAwv$  (1.854 m).   Weight as of this encounter: 203 lb (92.1 kg). Ideal: Ideal body weight: 79.9 kg (176 lb 2.4 oz) Adjusted  ideal body weight: 84.8 kg (186 lb 14.2 oz)  Assessment   Status Diagnosis  Controlled Controlled Controlled 1. Lumbar radiculopathy   2. Chronic radicular lumbar pain   3. 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  Mr. SABIEN UMLAND has a current medication list which includes the following long-term medication(s): atorvastatin and pregabalin.  Pharmacotherapy (Medications Ordered): Meds ordered this encounter  Medications    pregabalin (LYRICA) 75 MG capsule    Sig: Take 1 capsule (75 mg total) by mouth 2 (two) times daily.    Dispense:  90 capsule    Refill:  0    Fill one day early if pharmacy is closed on scheduled refill date. May substitute for generic if available.   Orders:  Orders Placed This Encounter  Procedures   Lumbar Transforaminal Epidural    Standing Status:   Future    Standing Expiration Date:   06/07/2021    Scheduling Instructions:     Side: RIGHT L3     Timeframe: ASAP    Order Specific Question:   Where will this procedure be performed?    Answer:   [facility]   DG Lumbar Spine Complete W/Bend    Patient presents with axial pain with possible radicular component. Please assist [place] in identifying specific level(s) and laterality of any additional findings such as: 1. Facet (Zygapophyseal) joint DJD (Hypertrophy, space narrowing, subchondral sclerosis, and/or osteophyte formation) 2. DDD and/or IVDD (Loss of disc height, desiccation, gas patterns, osteophytes, endplate sclerosis, or "Black disc disease") 3. Pars defects 4. Spondylolisthesis, spondylosis, and/or spondyloarthropathies (include Degree/Grade of displacement in mm) (stability) 5. Vertebral body Fractures (acute/chronic) (state percentage of collapse) 6. Demineralization (osteopenia/osteoporotic) 7. Bone pathology 8. Foraminal narrowing  9. Surgical changes    Standing Status:   Future    Number of Occurrences:   1    Standing Expiration Date:   06/07/2021    Scheduling Instructions:     Imaging must be done as soon as possible. Inform patient that order will expire within 30 days and I will not renew it.    Order Specific Question:   Reason for Exam (SYMPTOM  OR DIAGNOSIS REQUIRED)    Answer:   Low back pain    Order Specific Question:   Preferred imaging location?    Answer:   [facility]    Order Specific Question:   Call Results- Best Contact Number?    Answer:   [phone] ([facility])     Order Specific Question:   Radiology Contrast Protocol - do NOT remove file path    Answer:   [email or web address]    Order Specific Question:   Release to patient    Answer:   Immediate   Follow-up plan:   Return in about 6 days (around 05/13/2021) for R L3 TF ESI , minimal sedation (PO Valium).     {There is no content from the last Plan section.}   Recent Visits Date Type Provider Dept  03/04/21 Office Visit Gillis Santa, MD [facility]  02/18/21 Procedure visit Gillis Santa, MD [facility]  02/16/21 Office Visit Gillis Santa, MD [facility]  Showing recent visits within past 90 days and meeting all other requirements Today's Visits Date Type Provider Dept  05/07/21 Office Visit Gillis Santa,  MD [facility]  Showing today's visits and meeting all other requirements Future Appointments No visits were found meeting these conditions. Showing future appointments within next 90 days and meeting all other requirements  I discussed the assessment and treatment plan with the patient. The patient was provided an opportunity to ask questions and all were answered. The patient agreed with the plan and demonstrated an understanding of the instructions.  Patient advised to call back or seek an in-person evaluation if the symptoms or condition worsens.  Duration of encounter: *** minutes.  Note by: Gillis Santa, MD Date: 05/07/2021; Time: 9:21 AM

## 2021-05-13 ENCOUNTER — Ambulatory Visit
Admission: RE | Admit: 2021-05-13 | Discharge: 2021-05-13 | Disposition: A | Discharge status: Home / Self Care | Payer: BC Managed Care – PPO | Source: Ambulatory Visit | Attending: Student in an Organized Health Care Education/Training Program | Admitting: Student in an Organized Health Care Education/Training Program

## 2021-05-13 ENCOUNTER — Ambulatory Visit (HOSPITAL_BASED_OUTPATIENT_CLINIC_OR_DEPARTMENT_OTHER): Payer: BC Managed Care – PPO | Admitting: Student in an Organized Health Care Education/Training Program

## 2021-05-13 ENCOUNTER — Other Ambulatory Visit: Payer: Self-pay

## 2021-05-13 ENCOUNTER — Encounter: Payer: Self-pay | Admitting: Student in an Organized Health Care Education/Training Program

## 2021-05-13 DIAGNOSIS — G8929 Other chronic pain: Secondary | ICD-10-CM

## 2021-05-13 DIAGNOSIS — M48061 Spinal stenosis, lumbar region without neurogenic claudication: Secondary | ICD-10-CM | POA: Insufficient documentation

## 2021-05-13 DIAGNOSIS — M5416 Radiculopathy, lumbar region: Secondary | ICD-10-CM | POA: Insufficient documentation

## 2021-05-13 MED ORDER — LIDOCAINE HCL 2 % IJ SOLN
20.0000 mL | Freq: Once | INTRAMUSCULAR | Status: AC
  Administered 2021-05-13: 400 mg

## 2021-05-13 MED ORDER — ROPIVACAINE HCL 2 MG/ML IJ SOLN
1.0000 mL | Freq: Once | INTRAMUSCULAR | Status: AC
  Administered 2021-05-13: 1 mL via EPIDURAL

## 2021-05-13 MED ORDER — LIDOCAINE HCL 2 % IJ SOLN
INTRAMUSCULAR | Status: AC
  Filled 2021-05-13: qty 20

## 2021-05-13 MED ORDER — DEXAMETHASONE SODIUM PHOSPHATE 10 MG/ML IJ SOLN
10.0000 mg | Freq: Once | INTRAMUSCULAR | Status: AC
  Administered 2021-05-13: 10 mg

## 2021-05-13 MED ORDER — DIAZEPAM 5 MG PO TABS
5.0000 mg | ORAL_TABLET | ORAL | Status: AC
  Administered 2021-05-13: 5 mg via ORAL

## 2021-05-13 MED ORDER — IOHEXOL 180 MG/ML  SOLN
10.0000 mL | Freq: Once | INTRAMUSCULAR | Status: AC
  Administered 2021-05-13: 10 mL via EPIDURAL

## 2021-05-13 MED ORDER — SODIUM CHLORIDE (PF) 0.9 % IJ SOLN
INTRAMUSCULAR | Status: AC
  Filled 2021-05-13: qty 10

## 2021-05-13 MED ORDER — DEXAMETHASONE SODIUM PHOSPHATE 10 MG/ML IJ SOLN
INTRAMUSCULAR | Status: AC
  Filled 2021-05-13: qty 1

## 2021-05-13 MED ORDER — DIAZEPAM 5 MG PO TABS
ORAL_TABLET | ORAL | Status: AC
  Filled 2021-05-13: qty 1

## 2021-05-13 MED ORDER — SODIUM CHLORIDE 0.9% FLUSH
1.0000 mL | Freq: Once | INTRAVENOUS | Status: AC
  Administered 2021-05-13: 1 mL

## 2021-05-13 MED ORDER — ROPIVACAINE HCL 2 MG/ML IJ SOLN
INTRAMUSCULAR | Status: AC
  Filled 2021-05-13: qty 20

## 2021-05-13 NOTE — Progress Notes (Signed)
PROVIDER NOTE: Interpretation of information contained herein should be left to medically-trained personnel. Specific patient instructions are provided elsewhere under "Patient Instructions" section of medical record. This document was created in part using STT-dictation technology, any transcriptional errors that may result from this process are unintentional.  Patient: Wesley Adams Type: Established DOB: 1965/12/09 MRN: [id number] PCP: Steele Sizer, MD  Service: Procedure DOS: 05/13/2021 Setting: Ambulatory Location: Ambulatory outpatient facility Delivery: Face-to-face Provider: Gillis Santa, MD Specialty: Interventional Pain Management Specialty designation: 09 Location: Outpatient facility Ref. Prov.: Gillis Santa, MD    Primary Reason for Visit: Interventional Pain Management Treatment. CC: Back Pain (lower)   Procedure:           Type: Trans-Foraminal Epidural Steroid Injection (Lumbar) #1  Laterality: Right  Level: L3   Imaging: Fluoroscopic guidance Anesthesia: Local anesthesia (1-2% Lidocaine) Anxiolysis: Oral (Valium 5 mg) Sedation: minimal anxiolysis.  DOS: 05/13/2021  Performed by: Gillis Santa, MD  Purpose: Diagnostic/Therapeutic Indications: lumbar radicular pain severe enough to impact quality of life or function. 1. Lumbar radiculopathy   2. Chronic radicular lumbar pain   3. 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  From the lower tip of the scapula down to the tailbone and from flank to flank. Materials:  Tray: Block Needle(s):  Type: Spinal  Gauge (G): 22  Length: 3.5-in  Qty: 1 Pre-op H&P Assessment:  Wesley Adams is a 56 y.o. (year old), male patient, seen today  for interventional treatment. He  has a past surgical history that includes Spinal cord stimulator insertion (03/23/2017); Colonoscopy with propofol (N/A, 06/01/2017); Esophagogastroduodenoscopy (egd) with propofol (06/01/2017); polypectomy (06/01/2017); Spinal cord stimulator removal (N/A, 06/16/2017); LEFT HEART CATH AND CORONARY ANGIOGRAPHY (N/A, 12/08/2017); Posterior laminectomy / decompression lumbar spine (01/30/2015); and Femur fracture surgery (Right, 1985). Wesley Adams has a current medication list which includes the following prescription(s): atorvastatin, celecoxib, pregabalin, and tizanidine. His primarily concern today is the Back Pain (lower)  Initial Vital Signs:  Pulse/HCG Rate: 83  Temp: 98.4 F (36.9 C) Resp: (!) 24 BP: (!) 151/90 SpO2: 99 %  BMI: Estimated body mass index is 26.78 kg/m as calculated from the following:   Height as of 05/07/21: 6\' 1"  (1.854 m).   Weight as of this encounter: 203 lb (92.1 kg).  Risk Assessment: Allergies: Reviewed. He has No Known Allergies.  Allergy Precautions: None required Coagulopathies: Reviewed. None identified.  Blood-thinner therapy: None at this time Active Infection(s): Reviewed. None identified. Wesley Adams is afebrile  Site Confirmation: Wesley Adams was asked to confirm the procedure and laterality before marking the site Procedure checklist: Completed Consent: Before the procedure and under the influence of no sedative(s), amnesic(s), or anxiolytics, the patient was informed of the treatment options, risks and possible complications. To fulfill our ethical and legal obligations, as recommended by the American Medical Association's Code of Ethics, I have informed the patient of my clinical impression; the nature and purpose of the treatment or procedure; the risks, benefits, and possible complications of the intervention; the alternatives, including doing nothing; the risk(s) and benefit(s) of the alternative treatment(s) or  procedure(s); and the risk(s) and benefit(s) of doing nothing. The patient was provided information about the general risks and possible complications associated with the  procedure. These may include, but are not limited to: failure to achieve desired goals, infection, bleeding, organ or nerve damage, allergic reactions, paralysis, and death. In addition, the patient was informed of those risks and complications associated to Spine-related procedures, such as failure to decrease pain; infection (i.e.: Meningitis, epidural or intraspinal abscess); bleeding (i.e.: epidural hematoma, subarachnoid hemorrhage, or any other type of intraspinal or peri-dural bleeding); organ or nerve damage (i.e.: Any type of peripheral nerve, nerve root, or spinal cord injury) with subsequent damage to sensory, motor, and/or autonomic systems, resulting in permanent pain, numbness, and/or weakness of one or several areas of the body; allergic reactions; (i.e.: anaphylactic reaction); and/or death. Furthermore, the patient was informed of those risks and complications associated with the medications. These include, but are not limited to: allergic reactions (i.e.: anaphylactic or anaphylactoid reaction(s)); adrenal axis suppression; blood sugar elevation that in diabetics may result in ketoacidosis or comma; water retention that in patients with history of congestive heart failure may result in shortness of breath, pulmonary edema, and decompensation with resultant heart failure; weight gain; swelling or edema; medication-induced neural toxicity; particulate matter embolism and blood vessel occlusion with resultant organ, and/or nervous system infarction; and/or aseptic necrosis of one or more joints. Finally, the patient was informed that Medicine is not an exact science; therefore, there is also the possibility of unforeseen or unpredictable risks and/or possible complications that may result in a catastrophic outcome. The patient  indicated having understood very clearly. We have given the patient no guarantees and we have made no promises. Enough time was given to the patient to ask questions, all of which were answered to the patient's satisfaction. Mr. Sokolowski has indicated that he wanted to continue with the procedure. Attestation: I, the ordering provider, attest that I have discussed with the patient the benefits, risks, side-effects, alternatives, likelihood of achieving goals, and potential problems during recovery for the procedure that I have provided informed consent. Date   Time: 05/13/2021  8:28 AM  Pre-Procedure Preparation:  Monitoring: As per clinic protocol. Respiration, ETCO2, SpO2, BP, heart rate and rhythm monitor placed and checked for adequate function Safety Precautions: Patient was assessed for positional comfort and pressure points before starting the procedure. Time-out: I initiated and conducted the "Time-out" before starting the procedure, as per protocol. The patient was asked to participate by confirming the accuracy of the "Time Out" information. Verification of the correct person, site, and procedure were performed and confirmed by me, the nursing staff, and the patient. "Time-out" conducted as per Joint Commission's Universal Protocol (UP.01.01.01). Time: 2426  Description/Narrative of Procedure:          Target: The 6 o'clock position under the pedicle, on the affected side. Region: Posterolateral Lumbosacral Approach: Posterior Percutaneous Paravertebral approach.  Rationale (medical necessity): procedure needed and proper for the diagnosis and/or treatment of the patient's medical symptoms and needs. Procedural Technique Safety Precautions: Aspiration looking for blood return was conducted prior to all injections. At no point did we inject any substances, as a needle was being advanced. No attempts were made at seeking any paresthesias. Safe injection practices and needle disposal techniques  used. Medications properly checked for expiration dates. SDV (single dose vial) medications used. Description of the Procedure: Protocol guidelines were followed. The patient was placed in position over the procedure table. The target area was identified and the area prepped in the usual manner. Skin & deeper tissues infiltrated with local anesthetic. Appropriate amount of time allowed to pass for local  anesthetics to take effect. The procedure needles were then advanced to the target area. Proper needle placement secured. Negative aspiration confirmed. Solution injected in intermittent fashion, asking for systemic symptoms every 0.5cc of injectate. The needles were then removed and the area cleansed, making sure to leave some of the prepping solution back to take advantage of its long term bactericidal properties.  Vitals:   05/13/21 0841 05/13/21 0850 05/13/21 0900 05/13/21 0905  BP: (!) 151/90 (!) 142/92 139/83 138/83  Pulse: 83 76 76 75  Resp:  (!) 24 (!) 22 20  Temp: 98.4 F (36.9 C)     SpO2: 99% 100% 96% 97%  Weight: 203 lb (92.1 kg)       Start Time: 0858 hrs. End Time: 0903 hrs.   3cc solution made of 1 cc of preservative-free saline, 1 cc of 0.2% ropivacaine, 1 cc of Decadron 10 mg/cc. Injected after contrast confirmation  Imaging Guidance (Spinal):          Type of Imaging Technique: Fluoroscopy Guidance (Spinal) Indication(s): Assistance in needle guidance and placement for procedures requiring needle placement in or near specific anatomical locations not easily accessible without such assistance. Exposure Time: Please see nurses notes. Contrast: Before injecting any contrast, we confirmed that the patient did not have an allergy to iodine, shellfish, or radiological contrast. Once satisfactory needle placement was completed at the desired level, radiological contrast was injected. Contrast injected under live fluoroscopy. No contrast complications. See chart for type and volume  of contrast used. Fluoroscopic Guidance: I was personally present during the use of fluoroscopy. "Tunnel Vision Technique" used to obtain the best possible view of the target area. Parallax error corrected before commencing the procedure. "Direction-depth-direction" technique used to introduce the needle under continuous pulsed fluoroscopy. Once target was reached, antero-posterior, oblique, and lateral fluoroscopic projection used confirm needle placement in all planes. Images permanently stored in EMR. Interpretation: I personally interpreted the imaging intraoperatively. Adequate needle placement confirmed in multiple planes. Appropriate spread of contrast into desired area was observed. No evidence of afferent or efferent intravascular uptake. No intrathecal or subarachnoid spread observed. Permanent images saved into the patient's record.    Post-operative Assessment:  Post-procedure Vital Signs:  Pulse/HCG Rate: 75 (nsr)  Temp: 98.4 F (36.9 C) Resp: 20 BP: 138/83 SpO2: 97 %  EBL: None  Complications: No immediate post-treatment complications observed by team, or reported by patient.  Note: The patient tolerated the entire procedure well. A repeat set of vitals were taken after the procedure and the patient was kept under observation following institutional policy, for this type of procedure. Post-procedural neurological assessment was performed, showing return to baseline, prior to discharge. The patient was provided with post-procedure discharge instructions, including a section on how to identify potential problems. Should any problems arise concerning this procedure, the patient was given instructions to immediately contact us, at any time, without hesitation. In any case, we plan to contact the patient by telephone for a follow-up status report regarding this interventional procedure.  Comments:  No additional relevant information.  Plan of Care  Orders:  Orders Placed This  Encounter  Procedures   DG PAIN CLINIC C-ARM 1-60 MIN NO REPORT    Intraoperative interpretation by procedural physician at [place].    Standing Status:   Standing    Number of Occurrences:   1    Order Specific Question:   Reason for exam:    Answer:   Assistance in needle guidance and placement for  procedures requiring needle placement in or near specific anatomical locations not easily accessible without such assistance.   Medications ordered for procedure: Meds ordered this encounter  Medications   iohexol (OMNIPAQUE) 180 MG/ML injection 10 mL    Must be Myelogram-compatible. If not available, you may substitute with a water-soluble, non-ionic, hypoallergenic, myelogram-compatible radiological contrast medium.   lidocaine (XYLOCAINE) 2 % (with pres) injection 400 mg   sodium chloride flush (NS) 0.9 % injection 1 mL   ropivacaine (PF) 2 mg/mL (0.2%) (NAROPIN) injection 1 mL   dexamethasone (DECADRON) injection 10 mg   diazepam (VALIUM) tablet 5 mg    Make sure Flumazenil is available in the pyxis when using this medication. If oversedation occurs, administer 0.2 mg IV over 15 sec. If after 45 sec no response, administer 0.2 mg again over 1 min; may repeat at 1 min intervals; not to exceed 4 doses (1 mg)   Medications administered: We administered iohexol, lidocaine, sodium chloride flush, ropivacaine (PF) 2 mg/mL (0.2%), dexamethasone, and diazepam.  See the medical record for exact dosing, route, and time of administration.  Follow-up plan:   Return in about 3 weeks (around 06/03/2021) for Post Procedure Evaluation, virtual.      Recent Visits Date Type Provider Dept  05/07/21 Office Visit Gillis Santa, MD [facility]  03/04/21 Office Visit Gillis Santa, MD [facility]  02/18/21 Procedure visit Gillis Santa, MD [facility]  02/16/21 Office Visit Gillis Santa, MD [facility]  Showing recent visits within past 90 days and  meeting all other requirements Today's Visits Date Type Provider Dept  05/13/21 Procedure visit Gillis Santa, MD [facility]  Showing today's visits and meeting all other requirements Future Appointments Date Type Provider Dept  06/04/21 Appointment Gillis Santa, MD [facility]  Showing future appointments within next 90 days and meeting all other requirements  Disposition: Discharge home  Discharge (Date   Time): 05/13/2021; 0908 hrs.   Primary Care Physician: Steele Sizer, MD Location: [facility] Note by: Gillis Santa, MD Date: 05/13/2021; Time: 9:59 AM  Disclaimer:  Medicine is not an exact science. The only guarantee in medicine is that nothing is guaranteed. It is important to note that the decision to proceed with this intervention was based on the information collected from the patient. The Data and conclusions were drawn from the patient's questionnaire, the interview, and the physical examination. Because the information was provided in large part by the patient, it cannot be guaranteed that it has not been purposely or unconsciously manipulated. Every effort has been made to obtain as much relevant data as possible for this evaluation. It is important to note that the conclusions that lead to this procedure are derived in large part from the available data. Always take into account that the treatment will also be dependent on availability of resources and existing treatment guidelines, considered by other Pain Management Practitioners as being common knowledge and practice, at the time of the intervention. For Medico-Legal purposes, it is also important to point out that variation in procedural techniques and pharmacological choices are the acceptable norm. The indications, contraindications, technique, and results of the above procedure should only be interpreted and judged by a Board-Certified Interventional Pain Specialist with  extensive familiarity and expertise in the same exact procedure and technique.

## 2021-05-13 NOTE — Progress Notes (Signed)
Safety precautions to be maintained throughout the outpatient stay will include: orient to surroundings, keep bed in low position, maintain call bell within reach at all times, provide assistance with transfer out of bed and ambulation.  

## 2021-05-13 NOTE — Patient Instructions (Signed)

## 2021-05-14 ENCOUNTER — Telehealth: Payer: Self-pay | Admitting: *Deleted

## 2021-05-14 NOTE — Telephone Encounter (Signed)
No problems post procedure. 

## 2021-05-27 DIAGNOSIS — Z419 Encounter for procedure for purposes other than remedying health state, unspecified: Secondary | ICD-10-CM | POA: Diagnosis not present

## 2021-05-27 NOTE — Progress Notes (Signed)
Name: Wesley Adams   MRN: [id number]    DOB: 09-27-1965   Date:05/28/2021       Progress Note  Subjective  Chief Complaint  Follow Up  HPI  Lumbar Radiculopathy/Chronic pain syndrome: he has a long history of back pain, he had posterior laminectomy and decompression lumbar spine by Dr. Arnoldo Morale back in 01/2015, after that he got a spinal stimulator in 02/2017 however it was very bulky and bothersome so he had it removed in 05/2017. He is is seeing pain doctor - Dr. Holley Raring , he is taking Celebrex, Lyrica , he had two epidural injections with temporary relieve - a few days only. He saw  Dr. Renda Rolls at [facility]  and was referred for PT, he completed 19 sessions but continues to have pain. He continues to have daily, constant pain on lumbar spine with burning sensation that is now worse down the right leg , aggravated by movement, worse when lifting, twisting, bending, sitting for prolonged periods of time., last week the pain was so severe that he was unable to get in and out of his bed without assistance. He has not been back since 10/27 ( he works at a [employer] ). Dr. 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[address]) 12/06/2017   Atherosclerosis of aorta ([place]) 12/06/2017   Chronic bilateral low back pain with bilateral sciatica 10/03/2017   Chronic pain syndrome 10/03/2017   Tobacco use 05/27/2017   Other intervertebral disc degeneration, lumbar region 01/30/2015    Past Surgical History:  Procedure  Laterality Date   COLONOSCOPY WITH PROPOFOL N/A 06/01/2017   Procedure: COLONOSCOPY WITH PROPOFOL;  Surgeon: Lin Landsman, MD;  Location: [facility];  Service: Endoscopy;  Laterality: N/A;   ESOPHAGOGASTRODUODENOSCOPY (EGD) WITH PROPOFOL  06/01/2017   Procedure: ESOPHAGOGASTRODUODENOSCOPY (EGD) WITH PROPOFOL;  Surgeon: Lin Landsman, MD;  Location: [place];  Service: Endoscopy;;   [place]   he has a rod   LEFT HEART CATH AND CORONARY ANGIOGRAPHY N/A 12/08/2017   Procedure: LEFT HEART CATH AND CORONARY ANGIOGRAPHY;  Surgeon: Corey Skains, MD;  Location: [place] [facility];  Service: Cardiovascular;  Laterality: N/A;   POLYPECTOMY  06/01/2017   Procedure: POLYPECTOMY;  Surgeon: Lin Landsman, MD;  Location: [place];  Service: Endoscopy;;   POSTERIOR LAMINECTOMY / DECOMPRESSION LUMBAR SPINE  01/30/2015   with fusion, Dr. Arnoldo Morale   SPINAL CORD STIMULATOR INSERTION  03/23/2017   Dr Newman Pies, [facility]   [place] N/A 06/16/2017   Procedure: SPINAL CORD STIMULATOR REMOVAL;  Surgeon: Newman Pies, MD;  Location: [place];  Service: Neurosurgery;  Laterality: N/A;    Family History  Problem Relation Age of Onset   Alcohol abuse Mother    Heart attack Father    Heart disease Father    Obesity Father    Heart attack Paternal Grandfather     Social History   Tobacco Use  Smoking status: Every Day    Packs/day: 1.00    Years: 35.00    Pack years: 35.00    Types: Cigarettes    Start date: 08/02/1983   Smokeless tobacco: Never  Substance Use Topics   Alcohol use: Yes    Alcohol/week: 0.0 standard drinks    Comment: occasionally drinks beer     Current Outpatient Medications:    atorvastatin (LIPITOR) 40 MG tablet, Take 1 tablet (40 mg total) by mouth daily., Disp: 90 tablet, Rfl: 1   celecoxib (CELEBREX) 100 MG capsule, Take 100 mg by mouth 2 (two) times  daily., Disp: , Rfl:    pregabalin (LYRICA) 75 MG capsule, Take 1 capsule (75 mg total) by mouth 2 (two) times daily., Disp: 90 capsule, Rfl: 0   tiZANidine (ZANAFLEX) 4 MG tablet, Take 1 tablet (4 mg total) by mouth every 8 (eight) hours as needed for muscle spasms., Disp: 90 tablet, Rfl: 2  No Known Allergies  I personally reviewed active problem list, medication list, allergies, family history, social history, health maintenance with the patient/caregiver today.   ROS  Ten systems reviewed and is negative except as mentioned in HPI   Objective  Vitals:   05/28/21 1456  BP: 120/70  Pulse: 71  Resp: 18  Temp: 98.3 F (36.8 C)  TempSrc: Oral  SpO2: 99%  Weight: 200 lb 8 oz (90.9 kg)  Height: 6\' 1"  (1.854 m)    Body mass index is 26.45 kg/m.  Physical Exam  Constitutional: Patient appears well-developed and well-nourished.  No distress.  HEENT: head atraumatic, normocephalic, pupils equal and reactive to light, neck supple Cardiovascular: Normal rate, regular rhythm and normal heart sounds.  No murmur heard. No BLE edema. Pulmonary/Chest: Effort normal and breath sounds normal. No respiratory distress. Abdominal: Soft.  There is no tenderness. Psychiatric: Patient has a normal mood and affect. behavior is normal. Judgment and thought content normal.  Muscular skeletal: scars of lumbar spine, swelling of right  para spinal muscle , significant decrease of ROB of spine in all direction , slow gait, seems to favor left leg   Recent Results (from the past 2160 hour(s))  Lipid panel     Status: Abnormal   Collection Time: 05/05/21  3:20 PM  Result Value Ref Range   Cholesterol 168 <200 mg/dL   HDL 46 > OR = 40 mg/dL   Triglycerides 90 <150 mg/dL   LDL Cholesterol (Calc) 104 (H) mg/dL (calc)    Comment: Reference range: <100 . Desirable range <100 mg/dL for primary prevention;   <70 mg/dL for patients with CHD or diabetic patients  with > or = 2 CHD risk  factors. Marland Kitchen LDL-C is now calculated using the Martin-Hopkins  calculation, which is a validated novel method providing  better accuracy than the Friedewald equation in the  estimation of LDL-C.  Cresenciano Genre et al. Annamaria Helling. 0623;762(83): 2061-2068  (http://education.[employer].com/faq/FAQ164)    Total CHOL/HDL Ratio 3.7 <5.0 (calc)   Non-HDL Cholesterol (Calc) 122 <130 mg/dL (calc)    Comment: For patients with diabetes plus 1 major ASCVD risk  factor, treating to a non-HDL-C goal of <100 mg/dL  (LDL-C of <70 mg/dL) is considered a therapeutic  option.   Hepatic function panel     Status: Abnormal   Collection Time: 05/05/21  3:20 PM  Result Value Ref Range   Total Protein 6.3 6.1 - 8.1 g/dL   Albumin 4.3 3.6 - 5.1 g/dL   Globulin 2.0 1.9 - 3.7 g/dL (calc)  AG Ratio 2.2 1.0 - 2.5 (calc)   Total Bilirubin 0.3 0.2 - 1.2 mg/dL   Bilirubin, Direct 0.1 0.0 - 0.2 mg/dL   Indirect Bilirubin 0.2 0.2 - 1.2 mg/dL (calc)   Alkaline phosphatase (APISO) 61 35 - 144 U/L   AST 13 10 - 35 U/L   ALT 8 (L) 9 - 46 U/L     PHQ2/9: Depression screen [facility] 2/9 05/28/2021 05/07/2021 05/05/2021 04/02/2021 03/04/2021  Decreased Interest 0 0 0 0 2  Down, Depressed, Hopeless 0 0 0 0 0  PHQ - 2 Score 0 0 0 0 2  Altered sleeping 0 - 0 0 0  Tired, decreased energy 0 - 0 0 1  Change in appetite 0 - 0 0 0  Feeling bad or failure about yourself  0 - 0 0 0  Trouble concentrating 0 - 0 0 0  Moving slowly or fidgety/restless 0 - 0 0 0  Suicidal thoughts 0 - 0 0 0  PHQ-9 Score 0 - 0 0 3  Difficult doing work/chores - - - - -  Some recent data might be hidden    phq 9 is negative   Fall Risk: Fall Risk  05/28/2021 05/13/2021 05/07/2021 05/05/2021 04/02/2021  Falls in the past year? 0 0 0 0 1  Number falls in past yr: - - - - 0  Injury with Fall? - - - - 0  Comment - - - - -  Risk for fall due to : - - - - No Fall Risks  Follow up Falls prevention discussed - - Falls prevention discussed Falls prevention  discussed      Functional Status Survey: Is the patient deaf or have difficulty hearing?: No Does the patient have difficulty seeing, even when wearing glasses/contacts?: No Does the patient have difficulty concentrating, remembering, or making decisions?: No Does the patient have difficulty walking or climbing stairs?: Yes Does the patient have difficulty dressing or bathing?: No Does the patient have difficulty doing errands alone such as visiting a doctor's office or shopping?: No    Assessment & Plan   1. History of back surgery   2. Chronic left-sided low back pain with left-sided sciatica   FMLA forms filled out, extended time off until April

## 2021-05-28 ENCOUNTER — Encounter: Payer: Self-pay | Admitting: Family Medicine

## 2021-05-28 ENCOUNTER — Ambulatory Visit (INDEPENDENT_AMBULATORY_CARE_PROVIDER_SITE_OTHER): Payer: BC Managed Care – PPO | Admitting: Family Medicine

## 2021-05-28 VITALS — BP 120/70 | HR 71 | Temp 98.3°F | Resp 18 | Ht 73.0 in | Wt 200.5 lb

## 2021-05-28 DIAGNOSIS — Z9889 Other specified postprocedural states: Secondary | ICD-10-CM

## 2021-05-28 DIAGNOSIS — M5442 Lumbago with sciatica, left side: Secondary | ICD-10-CM | POA: Diagnosis not present

## 2021-05-28 DIAGNOSIS — G8929 Other chronic pain: Secondary | ICD-10-CM

## 2021-06-03 ENCOUNTER — Telehealth: Payer: Self-pay | Admitting: *Deleted

## 2021-06-03 NOTE — Telephone Encounter (Signed)
Attempted to call for pre appointment review of allergies/meds. Message left. 

## 2021-06-04 ENCOUNTER — Encounter: Payer: Self-pay | Admitting: Student in an Organized Health Care Education/Training Program

## 2021-06-04 ENCOUNTER — Ambulatory Visit
Payer: BC Managed Care – PPO | Attending: Student in an Organized Health Care Education/Training Program | Admitting: Student in an Organized Health Care Education/Training Program

## 2021-06-04 ENCOUNTER — Other Ambulatory Visit: Payer: Self-pay

## 2021-06-04 DIAGNOSIS — Z981 Arthrodesis status: Secondary | ICD-10-CM

## 2021-06-04 DIAGNOSIS — G894 Chronic pain syndrome: Secondary | ICD-10-CM

## 2021-06-04 DIAGNOSIS — M961 Postlaminectomy syndrome, not elsewhere classified: Secondary | ICD-10-CM | POA: Diagnosis not present

## 2021-06-04 DIAGNOSIS — M5136 Other intervertebral disc degeneration, lumbar region: Secondary | ICD-10-CM

## 2021-06-04 DIAGNOSIS — M5442 Lumbago with sciatica, left side: Secondary | ICD-10-CM

## 2021-06-04 DIAGNOSIS — M5441 Lumbago with sciatica, right side: Secondary | ICD-10-CM

## 2021-06-04 DIAGNOSIS — G8929 Other chronic pain: Secondary | ICD-10-CM

## 2021-06-04 DIAGNOSIS — M5416 Radiculopathy, lumbar region: Secondary | ICD-10-CM

## 2021-06-04 DIAGNOSIS — M48061 Spinal stenosis, lumbar region without neurogenic claudication: Secondary | ICD-10-CM | POA: Diagnosis not present

## 2021-06-04 NOTE — Progress Notes (Signed)
Patient: Wesley Adams  Service Category: E/M  Provider: Gillis Santa, MD  DOB: Oct 27, 1965  DOS: 06/04/2021  Location: Office  MRN: [id number]  Setting: Ambulatory outpatient  Referring Provider: Steele Sizer, MD  Type: Established Patient  Specialty: Interventional Pain Management  PCP: Steele Sizer, MD  Location: Remote location  Delivery: TeleHealth     Virtual Encounter - Pain Management PROVIDER NOTE: Information contained herein reflects review and annotations entered in association with encounter. Interpretation of such information and data should be left to medically-trained personnel. Information provided to patient can be located elsewhere in the medical record under "Patient Instructions". Document created using STT-dictation technology, any transcriptional errors that may result from process are unintentional.    Contact & Pharmacy Preferred: [phone] Home: [phone] (home) Mobile: [phone] (mobile) E-mail: Wesley Adams  [id number] [place] [address]: [phone]: [phone] [address] [place]: [phone]: [phone]  Pre-screening  Wesley Adams "in-person" vs "virtual" encounter. He indicated preferring virtual for this encounter.   Reason COVID-19*   Social distancing based on CDC and AMA recommendations.   I contacted Wesley Jeffersonon 06/04/2021 via telephone.      I clearly identified myself as BGillis Santa MD. I verified that I was speaking with the correct person using two identifiers (Name: TDERK DOUBEK and date of birth: [birth year].  Consent I sought verbal advanced consent from Wesley Jeffersonfor virtual visit interactions. I informed Mr. JPosadasof possible security and privacy concerns,  risks, and limitations associated with providing "not-in-person" medical evaluation and management services. I also informed Mr. JArvisoof the availability of "in-person" appointments. Finally, I informed him that there would be a charge for the virtual visit and that he could be  personally, fully or partially, financially responsible for it. Mr. JVanettenexpressed understanding and agreed to proceed.   Historic Elements   Wesley Adams a 56y.o. year old, male patient evaluated today after our last contact on 05/13/2021. Mr. JCelani has a past medical history of Chronic back pain, Flu, and Spinal cord stimulator status (04/29/2017). He also  has a past surgical history that includes Spinal cord stimulator insertion (03/23/2017); Colonoscopy with propofol (N/A, 06/01/2017); Esophagogastroduodenoscopy (egd) with propofol (06/01/2017); polypectomy (06/01/2017); Spinal cord stimulator removal (N/A, 06/16/2017); LEFT HEART CATH AND CORONARY ANGIOGRAPHY (N/A, 12/08/2017); Posterior laminectomy / decompression lumbar spine (01/30/2015); and Femur fracture surgery (Right, 1985). Mr. JHalfmannhas a current medication list which includes the following prescription(s): atorvastatin, celecoxib, pregabalin, and tizanidine. He  reports that he has been smoking cigarettes. He started smoking about 37 years ago. He has a 35.00 pack-year smoking history. He has never used smokeless tobacco. He reports current alcohol use. He reports that he does not use drugs. Mr. JHaggarthas No Known Allergies.   HPI  Today, he is being contacted for a post-procedure assessment.  Unfortunately no benefit with previous right L3 transforaminal epidural steroid injection.  [facility].  They ordered a CT of his pelvis it revealed. Symmetric arthrosis of the sacroiliac joints with nonspecific focal areas of subchondral cystic change within the right and left ilia.  He states that he has  an SI joint injection scheduled next week  with [place] [facility].   I informed Nnamdi to keep his follow-up with neurosurgery.  I have limited options for him at this time.  He has failed spinal injections in the past along with physical therapy and nonopioid-based medication management.  Not a candidate for chronic opioid therapy.  Status post spinal cord stimulator removal due to IPG being bothersome.  Laboratory Chemistry Profile   Renal Lab Results  Component Value Date   BUN 14 11/12/2020   CREATININE 1.02 25/50/0164   BCR NOT APPLICABLE 29/06/7953   GFRAA 118 01/02/2018   GFRNONAA 102 01/02/2018    Hepatic Lab Results  Component Value Date   AST 13 05/05/2021   ALT 8 (L) 05/05/2021   ALBUMIN 4.4 12/06/2017   ALKPHOS 64 12/06/2017   LIPASE 41 12/06/2017    Electrolytes Lab Results  Component Value Date   NA 141 11/12/2020   K 4.0 11/12/2020   CL 107 11/12/2020   CALCIUM 9.6 11/12/2020    Bone No results found for: VD25OH, VD125OH2TOT, OP1674AD5, KZ8948XA7, 25OHVITD1, 25OHVITD2, 25OHVITD3, TESTOFREE, TESTOSTERONE  Inflammation (CRP: Acute Phase) (ESR: Chronic Phase) No results found for: CRP, ESRSEDRATE, LATICACIDVEN       Note: Above Lab results reviewed.  Assessment  The primary encounter diagnosis was Lumbar radiculopathy. 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  Plan of Care  Keep appointment with neurosurgery  Follow-up plan:   Return if symptoms worsen or fail to improve.    Recent Visits Date Type Provider Dept  05/13/21 Procedure visit Gillis Santa, MD [facility]  05/07/21 Office Visit Gillis Santa, MD [facility]  Showing recent visits within past 90 days  and meeting all other requirements Today's Visits Date Type Provider Dept  06/04/21 Office Visit Gillis Santa, MD [facility]  Showing today's visits and meeting all other requirements Future Appointments No visits were found meeting these conditions. Showing future appointments within next 90 days and meeting all other requirements  I discussed the assessment and treatment plan with the patient. The patient was provided an opportunity to ask questions and all were answered. The patient agreed with the plan and demonstrated an understanding of the instructions.  Patient advised to call back or seek an in-person evaluation if the symptoms or condition worsens.  Duration of encounter: 48mnutes.  Note by: BGillis Santa MD Date: 06/04/2021; Time: 2:50 PM

## 2021-06-17 ENCOUNTER — Other Ambulatory Visit: Payer: Self-pay | Admitting: Neurosurgery

## 2021-06-17 ENCOUNTER — Other Ambulatory Visit (HOSPITAL_COMMUNITY): Payer: Self-pay | Admitting: Neurosurgery

## 2021-06-17 DIAGNOSIS — M419 Scoliosis, unspecified: Secondary | ICD-10-CM

## 2021-06-17 DIAGNOSIS — G8929 Other chronic pain: Secondary | ICD-10-CM

## 2021-06-17 DIAGNOSIS — M438X9 Other specified deforming dorsopathies, site unspecified: Secondary | ICD-10-CM

## 2021-06-24 DIAGNOSIS — Z419 Encounter for procedure for purposes other than remedying health state, unspecified: Secondary | ICD-10-CM | POA: Diagnosis not present

## 2021-07-01 ENCOUNTER — Ambulatory Visit: Payer: BC Managed Care – PPO

## 2021-07-01 ENCOUNTER — Other Ambulatory Visit: Payer: BC Managed Care – PPO

## 2021-07-07 NOTE — Progress Notes (Signed)
Name: Wesley Adams   MRN: [id number]    DOB: Sep 14, 1965   Date:07/08/2021 ? ?     Progress Note ? ?Subjective ? ?Chief Complaint ? ?Follow Up ? ?HPI ? ?Lumbar Radiculopathy/Chronic pain syndrome: he has a long history of back pain, he had posterior laminectomy and decompression lumbar spine by Dr. Arnoldo Morale back in 01/2015, after that he got a spinal stimulator in 02/2017 however it was very bulky and bothersome so he had it removed in 05/2017. He is is seeing pain doctor - Dr. Holley Raring , he is taking Celebrex, Lyrica , he had two epidural injections with temporary relieve - a few days only. He saw  Dr. Renda Rolls at [facility]  and was referred for PT, he completed 19 sessions but continues to have pain. He continues to have daily, constant pain on lumbar spine with burning sensation that is now worse down the right leg , aggravated by movement, worse when lifting, twisting, bending, sitting for prolonged periods of time., last week the pain was so severe that he was unable to get in and out of his bed without assistance. He has not been back since 10/27 ( he works at a [employer] ). Dr. Renda Rolls thinks is is chronic right SI joint pain , he had CT sacroiliac joint injection right side on 02/16 but he states pain did not improve. He is still in pain, his back gets locked up at times and he cannot move. He is not ready to return to work yet. He is also seeing Dr. Cari Caraway - neurosurgeon and is going to have another CT lumbar spine without contrast but waiting for approval.  ? ?Atherosclerosis aorta/iliac artery stenosis/CAD: advised him to stop smoking, continue Atorvastatin , and aspirin 81 mg. He denies side effects of medication. He has CAD but denies chest pain, SOB , not able to exercise due to back pain  ? ? ?Patient Active Problem List  ? Diagnosis Date Noted  ? Lumbar foraminal stenosis 05/07/2021  ? History of lumbar fusion (L4/5) 11/25/2020  ? 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 Lumbar radiculopathy  11/25/2020  ? Lumbar spondylosis 05/03/2018  ? Coronary artery disease involving native heart 12/12/2017  ? Iliac artery stenosis, left ([place]) 12/06/2017  ? Atherosclerosis of aorta ([place]) 12/06/2017  ? Chronic bilateral low back pain with bilateral sciatica 10/03/2017  ? Chronic pain syndrome 10/03/2017  ? Tobacco use 05/27/2017  ? Other intervertebral disc degeneration, lumbar region 01/30/2015  ? ? ?Past Surgical History:  ?Procedure Laterality Date  ? COLONOSCOPY WITH PROPOFOL N/A 06/01/2017  ? Procedure: COLONOSCOPY WITH PROPOFOL;  Surgeon: Lin Landsman, MD;  Location: [place];  Service: Endoscopy;  Laterality: N/A;  ? ESOPHAGOGASTRODUODENOSCOPY (EGD) WITH PROPOFOL  06/01/2017  ? Procedure: ESOPHAGOGASTRODUODENOSCOPY (EGD) WITH PROPOFOL;  Surgeon: Lin Landsman, MD;  Location: [place];  Service: Endoscopy;;  ? [place]  ? he has a rod  ? LEFT HEART CATH AND CORONARY ANGIOGRAPHY N/A 12/08/2017  ? Procedure: LEFT HEART CATH AND CORONARY ANGIOGRAPHY;  Surgeon: Corey Skains, MD;  Location: [place] [facility];  Service: Cardiovascular;  Laterality: N/A;  ? POLYPECTOMY  06/01/2017  ? Procedure: POLYPECTOMY;  Surgeon: Lin Landsman, MD;  Location: [place];  Service: Endoscopy;;  ? POSTERIOR LAMINECTOMY / DECOMPRESSION LUMBAR SPINE  01/30/2015  ? with fusion, Dr. Arnoldo Morale  ? SPINAL CORD STIMULATOR INSERTION  03/23/2017  ? Dr Newman Pies, [place] [facility]  ? SPINAL CORD STIMULATOR  REMOVAL N/A 06/16/2017  ? Procedure: SPINAL CORD STIMULATOR REMOVAL;  Surgeon: Newman Pies, MD;  Location: [place];  Service: Neurosurgery;  Laterality: N/A;  ? ? ?Family History  ?Problem Relation Age of Onset  ? Alcohol abuse Mother   ? Heart attack Father   ? Heart disease Father   ? Obesity Father   ? Heart attack Paternal Grandfather   ? ? ?Social History  ? ?Tobacco Use  ? Smoking status: Every Day  ?  Packs/day: 1.00  ?  Years: 35.00  ?  Pack  years: 35.00  ?  Types: Cigarettes  ?  Start date: 08/02/1983  ? Smokeless tobacco: Never  ?Substance Use Topics  ? Alcohol use: Yes  ?  Alcohol/week: 0.0 standard drinks  ?  Comment: occasionally drinks beer  ? ? ? ?Current Outpatient Medications:  ?  atorvastatin (LIPITOR) 40 MG tablet, Take 1 tablet (40 mg total) by mouth daily., Disp: 90 tablet, Rfl: 1 ?  celecoxib (CELEBREX) 100 MG capsule, Take 100 mg by mouth 2 (two) times daily., Disp: , Rfl:  ?  tiZANidine (ZANAFLEX) 4 MG tablet, Take 1 tablet (4 mg total) by mouth every 8 (eight) hours as needed for muscle spasms., Disp: 90 tablet, Rfl: 2 ?  pregabalin (LYRICA) 75 MG capsule, Take 1 capsule (75 mg total) by mouth 2 (two) times daily., Disp: 90 capsule, Rfl: 0 ? ?No Known Allergies ? ?I personally reviewed active problem list, medication list, allergies, family history, social history, health maintenance with the patient/caregiver today. ? ? ?ROS ? ?Ten systems reviewed and is negative except as mentioned in HPI  ? ?Objective ? ?Vitals:  ? 07/08/21 1451  ?BP: 136/64  ?Pulse: 82  ?Resp: 16  ?SpO2: 96%  ?Weight: 205 lb (93 kg)  ?Height: '6\' 1"'$  (1.854 m)  ? ? ?Body mass index is 27.05 kg/m?. ? ?Physical Exam ? ?Constitutional: Patient appears well-developed and well-nourished. Overweight.  No distress.  ?HEENT: head atraumatic, normocephalic, pupils equal and reactive to light, neck supple ?Cardiovascular: Normal rate, regular rhythm and normal heart sounds.  No murmur heard. No BLE edema. ?Pulmonary/Chest: Effort normal and breath sounds normal. No respiratory distress. ?Abdominal: Soft.  There is no tenderness. ?Muscular skeletal: tender during palpation of lumbar spine, negative straight leg raise on left  but positive on right side , walks slowly  ?Psychiatric: Patient has a normal mood and affect. behavior is normal. Judgment and thought content normal.  ? ?Recent Results (from the past 2160 hour(s))  ?Lipid panel     Status: Abnormal  ? Collection Time:  05/05/21  3:20 PM  ?Result Value Ref Range  ? Cholesterol 168 <200 mg/dL  ? HDL 46 > OR = 40 mg/dL  ? Triglycerides 90 <150 mg/dL  ? LDL Cholesterol (Calc) 104 (H) mg/dL (calc)  ?  Comment: Reference range: <100 ?Marland Kitchen ?Desirable range <100 mg/dL for primary prevention;   ?<70 mg/dL for patients with CHD or diabetic patients  ?with > or = 2 CHD risk factors. ?. ?LDL-C is now calculated using the Martin-Hopkins  ?calculation, which is a validated novel method providing  ?better accuracy than the Friedewald equation in the  ?estimation of LDL-C.  ?Cresenciano Genre et al. Annamaria Helling. 7425;956(38): 2061-2068  ?(http://education.[employer].com/faq/FAQ164) ?  ? Total CHOL/HDL Ratio 3.7 <5.0 (calc)  ? Non-HDL Cholesterol (Calc) 122 <130 mg/dL (calc)  ?  Comment: For patients with diabetes plus 1 major ASCVD risk  ?factor, treating to a non-HDL-C goal of <100 mg/dL  ?(LDL-C  of <70 mg/dL) is considered a therapeutic  ?option. ?  ?Hepatic function panel     Status: Abnormal  ? Collection Time: 05/05/21  3:20 PM  ?Result Value Ref Range  ? Total Protein 6.3 6.1 - 8.1 g/dL  ? Albumin 4.3 3.6 - 5.1 g/dL  ? Globulin 2.0 1.9 - 3.7 g/dL (calc)  ? AG Ratio 2.2 1.0 - 2.5 (calc)  ? Total Bilirubin 0.3 0.2 - 1.2 mg/dL  ? Bilirubin, Direct 0.1 0.0 - 0.2 mg/dL  ? Indirect Bilirubin 0.2 0.2 - 1.2 mg/dL (calc)  ? Alkaline phosphatase (APISO) 61 35 - 144 U/L  ? AST 13 10 - 35 U/L  ? ALT 8 (L) 9 - 46 U/L  ? ? ?PHQ2/9: ?Depression screen [facility] 2/9 07/08/2021 05/28/2021 05/07/2021 05/05/2021 04/02/2021  ?Decreased Interest 1 0 0 0 0  ?Down, Depressed, Hopeless 1 0 0 0 0  ?PHQ - 2 Score 2 0 0 0 0  ?Altered sleeping 0 0 - 0 0  ?Tired, decreased energy 0 0 - 0 0  ?Change in appetite 0 0 - 0 0  ?Feeling bad or failure about yourself  0 0 - 0 0  ?Trouble concentrating 0 0 - 0 0  ?Moving slowly or fidgety/restless 0 0 - 0 0  ?Suicidal thoughts 0 0 - 0 0  ?PHQ-9 Score 2 0 - 0 0  ?Difficult doing work/chores - - - - -  ?Some recent data might be hidden  ?  ?phq 9 is  positive, he is in pain  ? ? ?Fall Risk: ?Fall Risk  07/08/2021 05/28/2021 05/13/2021 05/07/2021 05/05/2021  ?Falls in the past year? 1 0 0 0 0  ?Number falls in past yr: 0 - - - -  ?Injury with Fall? 1 - - - -  ?Comme

## 2021-07-08 ENCOUNTER — Ambulatory Visit (INDEPENDENT_AMBULATORY_CARE_PROVIDER_SITE_OTHER): Payer: BC Managed Care – PPO | Admitting: Family Medicine

## 2021-07-08 ENCOUNTER — Encounter: Payer: Self-pay | Admitting: Family Medicine

## 2021-07-08 ENCOUNTER — Other Ambulatory Visit: Payer: Self-pay

## 2021-07-08 VITALS — BP 136/64 | HR 82 | Resp 16 | Ht 73.0 in | Wt 205.0 lb

## 2021-07-08 DIAGNOSIS — I771 Stricture of artery: Secondary | ICD-10-CM

## 2021-07-08 DIAGNOSIS — E7800 Pure hypercholesterolemia, unspecified: Secondary | ICD-10-CM

## 2021-07-08 DIAGNOSIS — Z981 Arthrodesis status: Secondary | ICD-10-CM | POA: Diagnosis not present

## 2021-07-08 DIAGNOSIS — G8929 Other chronic pain: Secondary | ICD-10-CM

## 2021-07-08 DIAGNOSIS — M5416 Radiculopathy, lumbar region: Secondary | ICD-10-CM

## 2021-07-08 DIAGNOSIS — I700 Atherosclerosis of aorta: Secondary | ICD-10-CM

## 2021-07-08 DIAGNOSIS — M62838 Other muscle spasm: Secondary | ICD-10-CM

## 2021-07-08 MED ORDER — TIZANIDINE HCL 4 MG PO TABS: 4.0000 mg | ORAL_TABLET | Freq: Three times a day (TID) | ORAL | 2 refills | Status: DC | PRN

## 2021-07-15 ENCOUNTER — Inpatient Hospital Stay: Admission: RE | Admit: 2021-07-15 | Payer: BC Managed Care – PPO | Source: Ambulatory Visit

## 2021-07-15 ENCOUNTER — Ambulatory Visit: Payer: BC Managed Care – PPO

## 2021-07-16 ENCOUNTER — Ambulatory Visit: Payer: BC Managed Care – PPO | Admitting: Podiatry

## 2021-07-16 ENCOUNTER — Other Ambulatory Visit: Payer: Self-pay

## 2021-07-16 ENCOUNTER — Ambulatory Visit (INDEPENDENT_AMBULATORY_CARE_PROVIDER_SITE_OTHER): Payer: BC Managed Care – PPO

## 2021-07-16 DIAGNOSIS — M722 Plantar fascial fibromatosis: Secondary | ICD-10-CM

## 2021-07-21 ENCOUNTER — Ambulatory Visit
Admission: RE | Admit: 2021-07-21 | Discharge: 2021-07-21 | Disposition: A | Discharge status: Home / Self Care | Payer: BC Managed Care – PPO | Source: Ambulatory Visit | Attending: Neurosurgery | Admitting: Neurosurgery

## 2021-07-21 ENCOUNTER — Encounter: Payer: Self-pay | Admitting: Podiatry

## 2021-07-21 ENCOUNTER — Other Ambulatory Visit: Payer: Self-pay

## 2021-07-21 DIAGNOSIS — G8929 Other chronic pain: Secondary | ICD-10-CM | POA: Diagnosis present

## 2021-07-21 DIAGNOSIS — M419 Scoliosis, unspecified: Secondary | ICD-10-CM

## 2021-07-21 DIAGNOSIS — M438X9 Other specified deforming dorsopathies, site unspecified: Secondary | ICD-10-CM | POA: Insufficient documentation

## 2021-07-21 DIAGNOSIS — M545 Low back pain, unspecified: Secondary | ICD-10-CM | POA: Diagnosis present

## 2021-07-21 NOTE — Progress Notes (Signed)
?  Subjective:  ?Patient ID: Wesley Adams, male    DOB: 1965/06/26,  MRN: [id number] ? ?Chief Complaint  ?Patient presents with  ? Foot Pain  ?  Left foot pain for about 4-5 months pain is mostly in the heel burning and throbbing pain   ? ? ?56 y.o. male presents with the above complaint.  Patient presents with left heel pain that has been there for for 5 months.  Patient states burning in the throbbing hurts with ambulation.  He states it is worse in the morning.  He has not seen anyone as prior to seeing me.  Pain scale is 8 out of 10 hurts with ambulation. ? ? ?Review of Systems: Negative except as noted in the HPI. Denies N/V/F/Ch. ? ?Past Medical History:  ?Diagnosis Date  ? Chronic back pain   ? Flu   ? 9/19  ? Spinal cord stimulator status 04/29/2017  ? [employer] Spinal Cord Stimulator placed by Dr. Newman Pies 03/23/2017.  ? ? ?Current Outpatient Medications:  ?  atorvastatin (LIPITOR) 40 MG tablet, Take 1 tablet (40 mg total) by mouth daily., Disp: 90 tablet, Rfl: 1 ?  celecoxib (CELEBREX) 100 MG capsule, Take 100 mg by mouth 2 (two) times daily., Disp: , Rfl:  ?  pregabalin (LYRICA) 75 MG capsule, Take 1 capsule (75 mg total) by mouth 2 (two) times daily., Disp: 90 capsule, Rfl: 0 ?  tiZANidine (ZANAFLEX) 4 MG tablet, Take 1 tablet (4 mg total) by mouth every 8 (eight) hours as needed for muscle spasms., Disp: 90 tablet, Rfl: 2 ? ?Social History  ? ?Tobacco Use  ?Smoking Status Every Day  ? Packs/day: 1.00  ? Years: 35.00  ? Pack years: 35.00  ? Types: Cigarettes  ? Start date: 08/02/1983  ?Smokeless Tobacco Never  ? ? ?No Known Allergies ?Objective:  ?There were no vitals filed for this visit. ?There is no height or weight on file to calculate BMI. ?Constitutional Well developed. ?Well nourished.  ?Vascular Dorsalis pedis pulses palpable bilaterally. ?Posterior tibial pulses palpable bilaterally. ?Capillary refill normal to all digits.  ?No cyanosis or clubbing noted. ?Pedal hair growth normal.   ?Neurologic Normal speech. ?Oriented to person, place, and time. ?Epicritic sensation to light touch grossly present bilaterally.  ?Dermatologic Nails well groomed and normal in appearance. ?No open wounds. ?No skin lesions.  ?Orthopedic: Normal joint ROM without pain or crepitus bilaterally. ?No visible deformities. ?Tender to palpation at the calcaneal tuber left. ?No pain with calcaneal squeeze left. ?Ankle ROM diminished range of motion left. ?Silfverskiold Test: positive left.  ? ?Radiographs: Taken and reviewed. No acute fractures or dislocations. No evidence of stress fracture.  Plantar heel spur absent. Posterior heel spur absent.  Pes planovalgus foot structure noted ? ?Assessment:  ? ?1. Plantar fasciitis   ? ?Plan:  ?Patient was evaluated and treated and all questions answered. ? ?Plantar Fasciitis, left ?- XR reviewed as above.  ?- Educated on icing and stretching. Instructions given.  ?- Injection delivered to the plantar fascia as below. ?- DME: Plantar fascial brace dispensed to support the medial longitudinal arch of the foot and offload pressure from the heel and prevent arch collapse during weightbearing ?- Pharmacologic management: None ? ?Procedure: Injection Tendon/Ligament ?Location: Left plantar fascia at the glabrous junction; medial approach. ?Skin Prep: alcohol ?Injectate: 0.5 cc 0.5% marcaine plain, 0.5 cc of 1% Lidocaine, 0.5 cc kenalog 10. ?Disposition: Patient tolerated procedure well. Injection site dressed with a band-aid. ? ?No follow-ups on file. ?

## 2021-07-25 DIAGNOSIS — Z419 Encounter for procedure for purposes other than remedying health state, unspecified: Secondary | ICD-10-CM | POA: Diagnosis not present

## 2021-08-10 NOTE — Progress Notes (Signed)
Name: Wesley Adams   MRN: [id number]    DOB: 04/25/1966   Date:08/11/2021 ? ?     Progress Note ? ?Subjective ? ?Chief Complaint ? ?Follow Up ? ?HPI ? ?Lumbar Radiculopathy/Chronic pain syndrome: he has a long history of back pain, he had posterior laminectomy and decompression lumbar spine by Dr. Arnoldo Morale back in 01/2015, after that he got a spinal stimulator in 02/2017 however it was very bulky and bothersome so he had it removed in 05/2017. He is is seeing pain doctor - Dr. Holley Raring , he is taking Celebrex, Lyrica , he had two epidural injections with temporary relieve - a few days only. He saw  Dr. Renda Rolls at [facility]  and was referred for PT, he completed 19 sessions but continues to have pain. He continues to have daily, constant pain on lumbar spine with burning sensation that is now worse down the right leg , aggravated by movement, worse when lifting, twisting, bending, sitting for prolonged periods of time. He has been out of work since 02/19/21 ( he works at a wearhouse ). Dr. Renda Rolls thinks is is chronic right SI joint pain , he had CT sacroiliac joint injection right side on 06/11/21  but he states pain did not improve.  He  saw Dr. Cari Caraway - neurosurgeon this morning to discuss results of lumbar spine CT done last month, the plan now is for him to have another spinal stimulator. Pain today is 10/10 , he states the pain is so intense that affects his sleep at night since he is not a candidate for spinal fusion  Dr. Cari Caraway thinks he may benefit from scoliosis  ? ?CT 07/21/2021 ?IMPRESSION: ?1. Prior L4-L5 decompression and fusion with solid arthrodesis and ?no adverse features. ?2. Adjacent segment disease at L3-L4 as well as L1-L2 and L2-L3 ?lumbar degeneration with spinal stenosis and foraminal stenosis ?appears stable since the MRI last year. ?3. Aortic Atherosclerosis (ICD10-I70.0). ? ?Atherosclerosis aorta/iliac artery stenosis/CAD: advised him to stop smoking, continue Atorvastatin , and  aspirin 81 mg. He denies side effects of medication. He has CAD but denies chest pain or SOB, however unable to exercise due to back pain.  ? ?Nasal congestion: he woke up last week with rhinorrhea, watery eyes, nasal congestion, and having left maxillary pressure, he denies fever or chills. He has some post-nasal drainage. He has tried Zyrtec , he has used nasal steroid but thinks it is an infection now.  ? ?Plantar fascitis : left foot seeing Dr. Posey Pronto and had a steroid injection recently  ? ?Patient Active Problem List  ? Diagnosis Date Noted  ? Lumbar foraminal stenosis 05/07/2021  ? History of lumbar fusion (L4/5) 11/25/2020  ? 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 Lumbar radiculopathy 11/25/2020  ? Lumbar spondylosis 05/03/2018  ? Coronary artery disease involving native heart 12/12/2017  ? Iliac artery stenosis, left ([place]) 12/06/2017  ? Atherosclerosis of aorta ([place]) 12/06/2017  ? Chronic bilateral low back pain with bilateral sciatica 10/03/2017  ? Chronic pain syndrome 10/03/2017  ? Tobacco use 05/27/2017  ? Other intervertebral disc degeneration, lumbar region 01/30/2015  ? ? ?Past Surgical History:  ?Procedure Laterality Date  ? COLONOSCOPY WITH PROPOFOL N/A 06/01/2017  ? Procedure: COLONOSCOPY WITH PROPOFOL;  Surgeon: Lin Landsman, MD;  Location: [place];  Service: Endoscopy;  Laterality: N/A;  ? ESOPHAGOGASTRODUODENOSCOPY (EGD) WITH PROPOFOL  06/01/2017  ? Procedure: ESOPHAGOGASTRODUODENOSCOPY (EGD) WITH PROPOFOL;  Surgeon: Lin Landsman, MD;  Location: [place];  Service:  Endoscopy;;  ? FEMUR FRACTURE SURGERY Right 1985  ? he has a rod  ? LEFT HEART CATH AND CORONARY ANGIOGRAPHY N/A 12/08/2017  ? Procedure: LEFT HEART CATH AND CORONARY ANGIOGRAPHY;  Surgeon: Corey Skains, MD;  Location: [place] [facility];  Service: Cardiovascular;  Laterality: N/A;  ? POLYPECTOMY  06/01/2017  ? Procedure: POLYPECTOMY;  Surgeon: Lin Landsman, MD;  Location: [place];  Service: Endoscopy;;  ? POSTERIOR LAMINECTOMY / DECOMPRESSION LUMBAR SPINE  01/30/2015  ? with fusion, Dr. Arnoldo Morale  ? SPINAL CORD STIMULATOR INSERTION  03/23/2017  ? Dr Newman Pies, [facility]  ? SPINAL CORD STIMULATOR REMOVAL N/A 06/16/2017  ? Procedure: SPINAL CORD STIMULATOR REMOVAL;  Surgeon: Newman Pies, MD;  Location: [place];  Service: Neurosurgery;  Laterality: N/A;  ? ? ?Family History  ?Problem Relation Age of Onset  ? Alcohol abuse Mother   ? Heart attack Father   ? Heart disease Father   ? Obesity Father   ? Heart attack Paternal Grandfather   ? ? ?Social History  ? ?Tobacco Use  ? Smoking status: Every Day  ?  Packs/day: 1.00  ?  Years: 35.00  ?  Pack years: 35.00  ?  Types: Cigarettes  ?  Start date: 08/02/1983  ? Smokeless tobacco: Never  ?Substance Use Topics  ? Alcohol use: Yes  ?  Alcohol/week: 0.0 standard drinks  ?  Comment: occasionally drinks beer  ? ? ? ?Current Outpatient Medications:  ?  atorvastatin (LIPITOR) 40 MG tablet, Take 1 tablet (40 mg total) by mouth daily., Disp: 90 tablet, Rfl: 1 ?  celecoxib (CELEBREX) 100 MG capsule, Take 100 mg by mouth 2 (two) times daily., Disp: , Rfl:  ?  tiZANidine (ZANAFLEX) 4 MG tablet, Take 1 tablet (4 mg total) by mouth every 8 (eight) hours as needed for muscle spasms., Disp: 90 tablet, Rfl: 2 ?  pregabalin (LYRICA) 75 MG capsule, Take 1 capsule (75 mg total) by mouth 2 (two) times daily., Disp: 90 capsule, Rfl: 0 ? ?No Known Allergies ? ?I personally reviewed active problem list, medication list, allergies, family history, social history, health maintenance with the patient/caregiver today. ? ? ?ROS ? ?Ten systems reviewed and is negative except as mentioned in HPI  ? ?Objective ? ?Vitals:  ? 08/11/21 1411  ?BP: 132/76  ?Pulse: 83  ?Resp: 16  ?SpO2: 98%  ?Weight: 200 lb (90.7 kg)  ?Height: '6\' 1"'$  (1.854 m)  ? ? ?Body mass index is 26.39 kg/m?. ? ?Physical Exam ? ?Constitutional: Patient appears well-developed and  well-nourished. Overweight.  No distress.  ?HEENT: head atraumatic, normocephalic, pupils equal and reactive to light, ear normal TM bilaterally , neck supple, tender during palpation of left maxillary sinus  ?Cardiovascular: Normal rate, regular rhythm and normal heart sounds.  No murmur heard. No BLE edema. ?Pulmonary/Chest: Effort normal and breath sounds normal. No respiratory distress. ?Abdominal: Soft.  There is no tenderness. ?Muscular skeletal: tender during palpation of lumbar spine, normal lower extremity strength  ?Psychiatric: Patient has a normal mood and affect. behavior is normal. Judgment and thought content normal.  ? ?PHQ2/9: ? ?  08/11/2021  ?  2:10 PM 07/08/2021  ?  2:50 PM 05/28/2021  ?  3:11 PM 05/07/2021  ?  8:38 AM 05/05/2021  ?  2:40 PM  ?Depression screen PHQ 2/9  ?Decreased Interest 0 1 0 0 0  ?Down, Depressed, Hopeless 3 1 0 0 0  ?PHQ - 2 Score 3 2 0 0  0  ?Altered sleeping 3 0 0  0  ?Tired, decreased energy 0 0 0  0  ?Change in appetite 0 0 0  0  ?Feeling bad or failure about yourself  0 0 0  0  ?Trouble concentrating 0 0 0  0  ?Moving slowly or fidgety/restless 0 0 0  0  ?Suicidal thoughts 0 0 0  0  ?PHQ-9 Score 6 2 0  0  ?  ?phq 9 is negative ? ? ?Fall Risk: ? ?  08/11/2021  ?  2:10 PM 07/08/2021  ?  2:50 PM 05/28/2021  ?  2:59 PM 05/13/2021  ?  8:45 AM 05/07/2021  ?  8:38 AM  ?Fall Risk   ?Falls in the past year? 1 1 0 0 0  ?Number falls in past yr: 0 0     ?Injury with Fall? 0 1     ?Risk for fall due to : No Fall Risks No Fall Risks     ?Follow up Falls prevention discussed Falls prevention discussed Falls prevention discussed    ? ? ? ? ?Functional Status Survey: ?Is the patient deaf or have difficulty hearing?: No ?Does the patient have difficulty seeing, even when wearing glasses/contacts?: No ?Does the patient have difficulty concentrating, remembering, or making decisions?: No ?Does the patient have difficulty walking or climbing stairs?: Yes ?Does the patient have difficulty dressing or  bathing?: No ?Does the patient have difficulty doing errands alone such as visiting a doctor's office or shopping?: No ? ? ? ?Assessment & Plan ? ?1. Chronic radicular lumbar pain ? ?- Ambulatory referral to [place]

## 2021-08-11 ENCOUNTER — Encounter: Payer: Self-pay | Admitting: Family Medicine

## 2021-08-11 ENCOUNTER — Ambulatory Visit (INDEPENDENT_AMBULATORY_CARE_PROVIDER_SITE_OTHER): Payer: BC Managed Care – PPO | Admitting: Family Medicine

## 2021-08-11 VITALS — BP 132/76 | HR 83 | Resp 16 | Ht 73.0 in | Wt 200.0 lb

## 2021-08-11 DIAGNOSIS — I771 Stricture of artery: Secondary | ICD-10-CM

## 2021-08-11 DIAGNOSIS — G8929 Other chronic pain: Secondary | ICD-10-CM

## 2021-08-11 DIAGNOSIS — J0100 Acute maxillary sinusitis, unspecified: Secondary | ICD-10-CM

## 2021-08-11 DIAGNOSIS — I700 Atherosclerosis of aorta: Secondary | ICD-10-CM

## 2021-08-11 DIAGNOSIS — M5416 Radiculopathy, lumbar region: Secondary | ICD-10-CM

## 2021-08-11 DIAGNOSIS — E7800 Pure hypercholesterolemia, unspecified: Secondary | ICD-10-CM

## 2021-08-11 DIAGNOSIS — M419 Scoliosis, unspecified: Secondary | ICD-10-CM

## 2021-08-11 DIAGNOSIS — Z981 Arthrodesis status: Secondary | ICD-10-CM | POA: Diagnosis not present

## 2021-08-11 MED ORDER — RYALTRIS 665-25 MCG/ACT NA SUSP: 2.0000 | Freq: Every day | NASAL | 0 refills | Status: DC

## 2021-08-13 ENCOUNTER — Encounter: Payer: Self-pay | Admitting: Podiatry

## 2021-08-13 ENCOUNTER — Ambulatory Visit: Payer: BC Managed Care – PPO | Admitting: Podiatry

## 2021-08-13 DIAGNOSIS — M722 Plantar fascial fibromatosis: Secondary | ICD-10-CM | POA: Diagnosis not present

## 2021-08-13 DIAGNOSIS — Q666 Other congenital valgus deformities of feet: Secondary | ICD-10-CM | POA: Diagnosis not present

## 2021-08-13 NOTE — Progress Notes (Signed)
?Subjective:  ?Patient ID: Wesley Adams, male    DOB: 1965/10/19,  MRN: [id number] ? ?Chief Complaint  ?Patient presents with  ? Plantar Fasciitis  ? ? ?56 y.o. male presents with the above complaint.  Patient presents with follow-up of left Planter fasciitis.  He states the injection helped he is doing a lot better.  He would like to discuss treatment options.  He does not wear any orthotics.  He states is about 80% better ? ?Review of Systems: Negative except as noted in the HPI. Denies N/V/F/Ch. ? ?Past Medical History:  ?Diagnosis Date  ? Chronic back pain   ? Flu   ? 9/19  ? Spinal cord stimulator status 04/29/2017  ? [employer] Spinal Cord Stimulator placed by Dr. Newman Pies 03/23/2017.  ? ? ?Current Outpatient Medications:  ?  atorvastatin (LIPITOR) 40 MG tablet, Take 1 tablet (40 mg total) by mouth daily., Disp: 90 tablet, Rfl: 1 ?  celecoxib (CELEBREX) 100 MG capsule, Take 100 mg by mouth 2 (two) times daily., Disp: , Rfl:  ?  Olopatadine-Mometasone (RYALTRIS) 665-25 MCG/ACT SUSP, Place 2 sprays into the nose daily at 12 noon., Disp: 29 g, Rfl: 0 ?  pregabalin (LYRICA) 75 MG capsule, Take 1 capsule (75 mg total) by mouth 2 (two) times daily., Disp: 90 capsule, Rfl: 0 ?  tiZANidine (ZANAFLEX) 4 MG tablet, Take 1 tablet (4 mg total) by mouth every 8 (eight) hours as needed for muscle spasms., Disp: 90 tablet, Rfl: 2 ? ?Social History  ? ?Tobacco Use  ?Smoking Status Every Day  ? Packs/day: 1.00  ? Years: 35.00  ? Pack years: 35.00  ? Types: Cigarettes  ? Start date: 08/02/1983  ?Smokeless Tobacco Never  ? ? ?No Known Allergies ?Objective:  ?There were no vitals filed for this visit. ?There is no height or weight on file to calculate BMI. ?Constitutional Well developed. ?Well nourished.  ?Vascular Dorsalis pedis pulses palpable bilaterally. ?Posterior tibial pulses palpable bilaterally. ?Capillary refill normal to all digits.  ?No cyanosis or clubbing noted. ?Pedal hair growth normal.  ?Neurologic  Normal speech. ?Oriented to person, place, and time. ?Epicritic sensation to light touch grossly present bilaterally.  ?Dermatologic Nails well groomed and normal in appearance. ?No open wounds. ?No skin lesions.  ?Orthopedic: Normal joint ROM without pain or crepitus bilaterally. ?No visible deformities. ?Tender to palpation at the calcaneal tuber left. ?No pain with calcaneal squeeze left. ?Ankle ROM diminished range of motion left. ?Silfverskiold Test: positive left.  ? ?Radiographs: Taken and reviewed. No acute fractures or dislocations. No evidence of stress fracture.  Plantar heel spur absent. Posterior heel spur absent.  Pes planovalgus foot structure noted ? ?Assessment:  ? ?1. Plantar fasciitis of left foot   ?2. Pes planovalgus   ? ? ?Plan:  ?Patient was evaluated and treated and all questions answered. ? ?Plantar Fasciitis, left ?- XR reviewed as above.  ?- Educated on icing and stretching. Instructions given.  ?-Second injection delivered to the plantar fascia as below. ?- DME: Plantar fascial brace dispensed to support the medial longitudinal arch of the foot and offload pressure from the heel and prevent arch collapse during weightbearing ?- Pharmacologic management: None ? ?Pes planovalgus ?-I explained to patient the etiology of pes planovalgus and relationship with Planter fasciitis and various treatment options were discussed.  Given patient foot structure in the setting of Planter fasciitis I believe patient will benefit from custom-made orthotics to help control the hindfoot motion support the arch of the  foot and take the stress away from plantar fascial.  Patient agrees with the plan like to proceed with orthotics ?-Patient was casted for orthotics ? ? ?Procedure: Injection Tendon/Ligament ?Location: Left plantar fascia at the glabrous junction; medial approach. ?Skin Prep: alcohol ?Injectate: 0.5 cc 0.5% marcaine plain, 0.5 cc of 1% Lidocaine, 0.5 cc kenalog 10. ?Disposition: Patient tolerated  procedure well. Injection site dressed with a band-aid. ? ?No follow-ups on file. ?

## 2021-08-17 ENCOUNTER — Encounter: Payer: Self-pay | Admitting: Psychology

## 2021-08-18 ENCOUNTER — Other Ambulatory Visit: Payer: Self-pay | Admitting: Neurosurgery

## 2021-08-18 DIAGNOSIS — M419 Scoliosis, unspecified: Secondary | ICD-10-CM

## 2021-08-18 DIAGNOSIS — M545 Low back pain, unspecified: Secondary | ICD-10-CM

## 2021-08-20 ENCOUNTER — Ambulatory Visit: Payer: BC Managed Care – PPO | Attending: Family Medicine

## 2021-08-20 DIAGNOSIS — G8929 Other chronic pain: Secondary | ICD-10-CM | POA: Insufficient documentation

## 2021-08-20 DIAGNOSIS — M419 Scoliosis, unspecified: Secondary | ICD-10-CM | POA: Diagnosis not present

## 2021-08-20 DIAGNOSIS — M5441 Lumbago with sciatica, right side: Secondary | ICD-10-CM | POA: Insufficient documentation

## 2021-08-20 DIAGNOSIS — M5416 Radiculopathy, lumbar region: Secondary | ICD-10-CM | POA: Diagnosis not present

## 2021-08-20 DIAGNOSIS — Z981 Arthrodesis status: Secondary | ICD-10-CM | POA: Diagnosis not present

## 2021-08-20 NOTE — Therapy (Signed)
[place] ?[facility] ?[address] ?[place], [place], [address] ?Phone: [phone]   Fax:  [phone] ? ?Physical Therapy Evaluation/ FCE ? ?Patient Details  ?Name: Wesley Adams ?MRN: [id number] ?Date of Birth: 1966/02/26 ?Referring Provider (PT): Steele Sizer, MD ? ? ?Encounter Date: 08/20/2021 ? ? PT End of Session - 08/20/21 0805   ? ? Visit Number 1   ? Number of Visits 1   ? PT Start Time 0801   ? PT Stop Time 1101   ? PT Time Calculation (min) 180 min   ? Activity Tolerance Patient limited by pain   ? Behavior During Therapy [facility] for tasks assessed/performed   ? ?  ?  ? ?  ? ? ?Past Medical History:  ?Diagnosis Date  ? Chronic back pain   ? Flu   ? 9/19  ? Spinal cord stimulator status 04/29/2017  ? [employer] Spinal Cord Stimulator placed by Dr. Newman Pies 03/23/2017.  ? ? ?Past Surgical History:  ?Procedure Laterality Date  ? COLONOSCOPY WITH PROPOFOL N/A 06/01/2017  ? Procedure: COLONOSCOPY WITH PROPOFOL;  Surgeon: Lin Landsman, MD;  Location: [place];  Service: Endoscopy;  Laterality: N/A;  ? ESOPHAGOGASTRODUODENOSCOPY (EGD) WITH PROPOFOL  06/01/2017  ? Procedure: ESOPHAGOGASTRODUODENOSCOPY (EGD) WITH PROPOFOL;  Surgeon: Lin Landsman, MD;  Location: [place];  Service: Endoscopy;;  ? [place]  ? he has a rod  ? LEFT HEART CATH AND CORONARY ANGIOGRAPHY N/A 12/08/2017  ? Procedure: LEFT HEART CATH AND CORONARY ANGIOGRAPHY;  Surgeon: Corey Skains, MD;  Location: [place] [facility];  Service: Cardiovascular;  Laterality: N/A;  ? POLYPECTOMY  06/01/2017  ? Procedure: POLYPECTOMY;  Surgeon: Lin Landsman, MD;  Location: [place];  Service: Endoscopy;;  ? POSTERIOR LAMINECTOMY / DECOMPRESSION LUMBAR SPINE  01/30/2015  ? with fusion, Dr. Arnoldo Morale  ? SPINAL CORD STIMULATOR INSERTION  03/23/2017  ? Dr Newman Pies, [facility]  ? SPINAL CORD STIMULATOR REMOVAL  N/A 06/16/2017  ? Procedure: SPINAL CORD STIMULATOR REMOVAL;  Surgeon: Newman Pies, MD;  Location: [place];  Service: Neurosurgery;  Laterality: N/A;  ? ? ?There were no vitals filed for this visit. ? ? ? Subjective Assessment - 08/20/21 1137   ? ? Subjective Mr Wesley Adams reports need for FCE due to need for information to fill out LTD papers. Chronic LBP limiting ability to return to work and play with children.   ? Pertinent History Pt is a 56 year old male presenting with chronic LBP, current episode more R sided with numbness and tingling down posterior RLE to mid calf area. Current pain began July 2019 when he went to lift 50# box while turning to the right to load a package. Pt would like surgical intervention, but is open to conservative measures. Has had 1 epidural injection, and has another today, reports he had minimal pain relief from first. 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 Pain at rest 7/10 and with aggravating events is 10/10. 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 He would like to be able to get back to full time work, including heavy lifting. Reports his pain is better when he is walking- he can only sit and standing for any period of time, that he has horrible  pain at night, especially R side, wakes up every 3 hours to pace his floors. He reports increased pain when he WB more through RLE to step off curb, negotiate steps (has 12 steps to enter/exit work) or if he is turning to the R. Notices pain relief with "hunching over" forward bending.  In addition to working, he is a father of 38 young children (40,64, and 33 year old twins) that enjoy playing football, soccer, and he has had trouble running and playing with them. PMH L4-5 decompression with fusion and spine stimulator from 2016-2019. Pt denies N/V,  B&B changes, unexplained weight fluctuation, saddle paresthesia, fever, night sweats, or unrelenting night pain at this time.   ? ?  ?  ? ?  ? ? ? ? ? [facility] PT Assessment - 08/20/21 0001   ? ?  ? Assessment  ? Medical Diagnosis chronic LBP with RT sciatica   ? Referring Provider (PT) Steele Sizer, MD   ? Onset Date/Surgical Date 10/30/20   ? Hand Dominance Right   ? Prior Therapy ended 03/2021   ?  ? Precautions  ? Precautions None   ?  ? Restrictions  ? Weight Bearing Restrictions No   ?  ? Prior Function  ? Level of Independence Independent   ?  ? Cognition  ? Overall Cognitive Status Within Functional Limits for tasks assessed   ? ?  ?  ? ?  ? ? ? ? ? ? ? ? ? ? ? ? ? ?Objective measurements completed on examination: See above findings.  ? ? ? ? ? ? ? ? ? ? ? ? ? ? PT Education - 08/20/21 1139   ? ? Education Details discussed FCE results. Probable increased soreness for a 2-3 day period.   ? Person(s) Educated Patient   ? Methods Explanation   ? Comprehension Verbalized understanding   ? ?  ?  ? ?  ? ? ? ? ? ? PT Long Term Goals - 03/25/21 0955   ? ?  ? PT LONG TERM GOAL #1  ? Title Pt will decrease worst back pain as reported on NPRS by at least 2 points in order to demonstrate clinically significant reduction in back pain.   ? Baseline 02/18/21 10/10; 03/25/21 8/10   ? Time 8   ? Period Weeks   ? Status Achieved   ?  ? PT LONG TERM GOAL #2  ? Title Patient will increase FOTO score to 60 to demonstrate predicted increase in functional mobility to complete ADLs   ? Baseline 02/18/21 48; 03/25/21 44   ? Time 8   ? Period Weeks   ? Status New   ?  ? PT LONG TERM GOAL #3  ? Title Pt will demonstrate 10MWT speed of at least 1.17ms to demonstrate community ambulation speed   ? Baseline 02/18/21 0.933m; 03/25/21 1.0436m  ? Time 8   ? Period Weeks   ? Status On-going   ?  ? PT LONG TERM GOAL #4  ? Title Pt will demonstrate R SLS of at least 17 secs to demonstrate PLOF of LLE static balance to demonstrate decreased  fall risk   ? Baseline 02/18/21 R 3sec L 17sec; 03/25/21 R 29sec L stopped at 45sec   ? Time 8   ? Period Weeks   ? Status Achieved   ? ?  ?  ? ?  ? ? ? ? ? ? ? ? ? Plan - 08/20/21 1140   ? ?  Clinical Impression Statement Mr Wesley Adams completed the FCe with pain level of 9/10. He was rated at sedentary level for 8 hour work.See the report for details.   ? PT Next Visit Plan No follow up post FCE.  May benefit from return to PT. Encouraged him ot do his HEP.   ? Consulted and Agree with Plan of Care Patient   ? ?  ?  ? ?  ? ? ?Patient will benefit from skilled therapeutic intervention in order to improve the following deficits and impairments:    ? ?Visit Diagnosis: ?Chronic right-sided low back pain with right-sided sciatica ? ? ? ? ?Problem List ?Patient Active Problem List  ? Diagnosis Date Noted  ? Lumbar foraminal stenosis 05/07/2021  ? History of lumbar fusion (L4/5) 11/25/2020  ? 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 Lumbar radiculopathy 11/25/2020  ? Lumbar spondylosis 05/03/2018  ? Coronary artery disease involving native heart 12/12/2017  ? Iliac artery stenosis, left ([place]) 12/06/2017  ? Atherosclerosis of aorta ([place]) 12/06/2017  ? Chronic bilateral low back pain with bilateral sciatica 10/03/2017  ? Chronic pain syndrome 10/03/2017  ? Tobacco use 05/27/2017  ? Other intervertebral disc degeneration, lumbar region 01/30/2015  ? ? ?Wesley Adams, PT ?08/20/2021, 11:43 AM ? ?[place] ?[facility] ?[address] ?[place], [place], [address] ?Phone: [phone]   Fax:  [phone] ? ?Name: Wesley Adams ?MRN: [id number] ?Date of Birth: Apr 25, 1966 ? ? ?

## 2021-08-24 DIAGNOSIS — Z419 Encounter for procedure for purposes other than remedying health state, unspecified: Secondary | ICD-10-CM | POA: Diagnosis not present

## 2021-08-26 ENCOUNTER — Telehealth: Payer: Self-pay | Admitting: Student in an Organized Health Care Education/Training Program

## 2021-08-26 NOTE — Telephone Encounter (Signed)
Nobody has seen any paperwork and we do not do disability.   ?

## 2021-08-26 NOTE — Telephone Encounter (Signed)
Dropped paper work off to be completed about 3 weeks ago. He needs to pick this up if it is completed.  ?

## 2021-08-26 NOTE — Telephone Encounter (Signed)
Im not sure it was disability / can you please call him  ?

## 2021-08-27 ENCOUNTER — Ambulatory Visit
Admission: RE | Admit: 2021-08-27 | Discharge: 2021-08-27 | Disposition: A | Discharge status: Home / Self Care | Payer: BC Managed Care – PPO | Source: Ambulatory Visit | Attending: Neurosurgery | Admitting: Neurosurgery

## 2021-08-27 DIAGNOSIS — M419 Scoliosis, unspecified: Secondary | ICD-10-CM | POA: Insufficient documentation

## 2021-08-27 DIAGNOSIS — M545 Low back pain, unspecified: Secondary | ICD-10-CM | POA: Insufficient documentation

## 2021-08-27 DIAGNOSIS — G8929 Other chronic pain: Secondary | ICD-10-CM | POA: Insufficient documentation

## 2021-08-27 NOTE — Telephone Encounter (Signed)
Spoke with patient and informed him that we have not seen any papers for him.  Patient states that it is not disability papers.  He will bring the papers back in and hand them to a nurse and get the nurses name.  ?

## 2021-09-02 ENCOUNTER — Telehealth: Payer: Self-pay

## 2021-09-02 NOTE — Telephone Encounter (Signed)
Lmom to schedule appt for orthotics pick up

## 2021-09-15 ENCOUNTER — Ambulatory Visit: Payer: BC Managed Care – PPO | Admitting: Podiatry

## 2021-09-15 DIAGNOSIS — M722 Plantar fascial fibromatosis: Secondary | ICD-10-CM | POA: Diagnosis not present

## 2021-09-15 DIAGNOSIS — Q666 Other congenital valgus deformities of feet: Secondary | ICD-10-CM

## 2021-09-15 NOTE — Progress Notes (Signed)
Subjective:  Patient ID: Wesley Adams, male    DOB: 1966/01/09,  MRN: [id number]  Chief Complaint  Patient presents with   Plantar Fasciitis    Pt stated that his foot is doing better     56 y.o. male presents with the above complaint.  Patient presents with follow-up of left Planter fasciitis.  He states is 100% better the injection helped.  He does not have any pain with ambulation.  He has been wearing his boots.  He does not wear orthotics at this time.  He is awaiting his orthotics  Review of Systems: Negative except as noted in the HPI. Denies N/V/F/Ch.  Past Medical History:  Diagnosis Date   Chronic back pain    Flu    9/19   Spinal cord stimulator status 04/29/2017   [facility] Scientific Spinal Cord Stimulator placed by Dr. Newman Pies 03/23/2017.    Current Outpatient Medications:    atorvastatin (LIPITOR) 40 MG tablet, Take 1 tablet (40 mg total) by mouth daily., Disp: 90 tablet, Rfl: 1   celecoxib (CELEBREX) 100 MG capsule, Take 100 mg by mouth 2 (two) times daily., Disp: , Rfl:    Olopatadine-Mometasone (RYALTRIS) 665-25 MCG/ACT SUSP, Place 2 sprays into the nose daily at 12 noon., Disp: 29 g, Rfl: 0   pregabalin (LYRICA) 75 MG capsule, Take 1 capsule (75 mg total) by mouth 2 (two) times daily., Disp: 90 capsule, Rfl: 0   tiZANidine (ZANAFLEX) 4 MG tablet, Take 1 tablet (4 mg total) by mouth every 8 (eight) hours as needed for muscle spasms., Disp: 90 tablet, Rfl: 2  Social History   Tobacco Use  Smoking Status Every Day   Packs/day: 1.00   Years: 35.00   Pack years: 35.00   Types: Cigarettes   Start date: 08/02/1983  Smokeless Tobacco Never    No Known Allergies Objective:  There were no vitals filed for this visit. There is no height or weight on file to calculate BMI. Constitutional Well developed. Well nourished.  Vascular Dorsalis pedis pulses palpable bilaterally. Posterior tibial pulses palpable bilaterally. Capillary refill normal to all  digits.  No cyanosis or clubbing noted. Pedal hair growth normal.  Neurologic Normal speech. Oriented to person, place, and time. Epicritic sensation to light touch grossly present bilaterally.  Dermatologic Nails well groomed and normal in appearance. No open wounds. No skin lesions.  Orthopedic: Normal joint ROM without pain or crepitus bilaterally. No visible deformities. No further tender to palpation at the calcaneal tuber left. No pain with calcaneal squeeze left. Ankle ROM diminished range of motion left. Silfverskiold Test: positive left.   Radiographs: Taken and reviewed. No acute fractures or dislocations. No evidence of stress fracture.  Plantar heel spur absent. Posterior heel spur absent.  Pes planovalgus foot structure noted  Assessment:   1. Plantar fasciitis of left foot   2. Pes planovalgus      Plan:  Patient was evaluated and treated and all questions answered.  Plantar Fasciitis, left - Clinically improved with steroid injection and bracing.  At this time I discussed orthotics management and shoe gear modification.  Patient states understanding and is awaiting his orthotics.  If any foot and ankle issues arise in the future advised her to come back and see me  Pes planovalgus -I explained to patient the etiology of pes planovalgus and relationship with Planter fasciitis and various treatment options were discussed.  Given patient foot structure in the setting of Planter fasciitis I believe patient will benefit  from custom-made orthotics to help control the hindfoot motion support the arch of the foot and take the stress away from plantar fascial.  Patient agrees with the plan like to proceed with orthotics -Patient is awaiting his orthotics   No follow-ups on file.

## 2021-09-24 DIAGNOSIS — Z419 Encounter for procedure for purposes other than remedying health state, unspecified: Secondary | ICD-10-CM | POA: Diagnosis not present

## 2021-09-25 ENCOUNTER — Ambulatory Visit (INDEPENDENT_AMBULATORY_CARE_PROVIDER_SITE_OTHER): Payer: BC Managed Care – PPO

## 2021-09-25 DIAGNOSIS — M722 Plantar fascial fibromatosis: Secondary | ICD-10-CM | POA: Diagnosis not present

## 2021-09-25 DIAGNOSIS — Q666 Other congenital valgus deformities of feet: Secondary | ICD-10-CM

## 2021-09-25 NOTE — Progress Notes (Signed)
SITUATION Reason for Consult: Evaluation for Bilateral Custom Foot Orthoses Patient / Caregiver Report: Patient is ready for foot orthotics  OBJECTIVE DATA: Patient History / Diagnosis:    ICD-10-CM   1. Plantar fasciitis of left foot  M72.2     2. 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  ACTIONS PERFORMED Potential out of pocket cost was communicated to patient. Patient understood and consent to casting. Patient was casted for Foot Orthoses via crush box. Procedure was explained and patient tolerated procedure well. Casts were shipped to central fabrication. All questions were answered and concerns addressed.  PLAN Patient is to be called for fitting when devices are ready.

## 2021-10-20 NOTE — Progress Notes (Signed)
Name: Wesley Adams   MRN: [id number]    DOB: 14-Jan-1966   Date:10/21/2021       Progress Note  Subjective  Chief Complaint  Follow Up  HPI  Lumbar Radiculopathy/Chronic pain syndrome: he has a long history of back pain, he had posterior laminectomy and decompression lumbar spine by Dr. Arnoldo Morale back in 01/2015, after that he got a spinal stimulator in 02/2017 however it was very bulky and bothersome so he had it removed in 05/2017. He is is seeing pain doctor - Dr. Holley Raring , he was Celebrex, Lyrica , he had two epidural injections with temporary relieve - a few days only. He saw  Dr. Renda Rolls at [facility]  and was referred for PT, he completed 19 sessions but continues to have pain.Marland Kitchen He has been out of work since 02/19/21 ( he works at a [employer] ). Dr. Renda Rolls thinks is is chronic right SI joint pain , he had CT sacroiliac joint injection right side on 06/11/21  but he states pain did not improve.  He  saw Dr. Cari Caraway - neurosurgeon and was advised to have spinal stimulator. He is still taking celebrex prn, but not compliant with lyrica, we will resume Lyrica. His pain is getting worse from right lower back down to buttocks, affecting his gait. 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   Lumbar radiculopathy 11/25/2020   Lumbar spondylosis 05/03/2018   Coronary artery disease involving native heart 12/12/2017   Iliac artery stenosis, left ([place]) 12/06/2017   Atherosclerosis of aorta ([place]) 12/06/2017   Chronic bilateral low back pain with bilateral sciatica 10/03/2017   Chronic pain syndrome 10/03/2017   Tobacco use 05/27/2017   Other intervertebral disc degeneration, lumbar region 01/30/2015    Past Surgical History:  Procedure Laterality Date    COLONOSCOPY WITH PROPOFOL N/A 06/01/2017   Procedure: COLONOSCOPY WITH PROPOFOL;  Surgeon: Lin Landsman, MD;  Location: [place];  Service: Endoscopy;  Laterality: N/A;   ESOPHAGOGASTRODUODENOSCOPY (EGD) WITH PROPOFOL  06/01/2017   Procedure: ESOPHAGOGASTRODUODENOSCOPY (EGD) WITH PROPOFOL;  Surgeon: Lin Landsman, MD;  Location: [place];  Service: Endoscopy;;   [place]   he has a rod   LEFT HEART CATH AND CORONARY ANGIOGRAPHY N/A 12/08/2017   Procedure: LEFT HEART CATH AND CORONARY ANGIOGRAPHY;  Surgeon: Corey Skains, MD;  Location: [place] [facility];  Service: Cardiovascular;  Laterality: N/A;   POLYPECTOMY  06/01/2017   Procedure: POLYPECTOMY;  Surgeon: Lin Landsman, MD;  Location: [place];  Service: Endoscopy;;   POSTERIOR LAMINECTOMY / DECOMPRESSION LUMBAR SPINE  01/30/2015   with fusion, Dr. Arnoldo Morale   SPINAL CORD STIMULATOR INSERTION  03/23/2017   Dr Newman Pies, [facility]   [place] N/A 06/16/2017   Procedure: SPINAL CORD STIMULATOR REMOVAL;  Surgeon: Newman Pies, MD;  Location: [place];  Service: Neurosurgery;  Laterality: N/A;    Family History  Problem Relation Age of Onset   Alcohol abuse Mother    Heart attack Father    Heart disease Father    Obesity Father    Heart attack Paternal Grandfather     Social History   Tobacco Use   Smoking status: Every Day  Packs/day: 1.00    Years: 35.00    Total pack years: 35.00    Types: Cigarettes    Start date: 08/02/1983   Smokeless tobacco: Never  Substance Use Topics   Alcohol use: Yes    Alcohol/week: 0.0 standard drinks of alcohol    Comment: occasionally drinks beer     Current Outpatient Medications:    celecoxib (CELEBREX) 100 MG capsule, Take 100 mg by mouth 2 (two) times daily., Disp: , Rfl:    tiZANidine (ZANAFLEX) 4 MG tablet, Take 1 tablet (4 mg total) by mouth every 8 (eight) hours  as needed for muscle spasms., Disp: 90 tablet, Rfl: 2   atorvastatin (LIPITOR) 40 MG tablet, Take 1 tablet (40 mg total) by mouth daily., Disp: 90 tablet, Rfl: 1   pregabalin (LYRICA) 75 MG capsule, Take 1 capsule (75 mg total) by mouth 3 (three) times daily., Disp: 90 capsule, Rfl: 0  No Known Allergies  I personally reviewed active problem list, medication list, allergies, family history, social history, health maintenance with the patient/caregiver today.   ROS  Ten systems reviewed and is negative except as mentioned in HPI  Objective  Vitals:   10/21/21 0848  BP: 116/72  Pulse: 90  Resp: 16  SpO2: 99%  Weight: 194 lb (88 kg)  Height: '6\' 1"'$  (1.854 m)    Body mass index is 25.6 kg/m.  Physical Exam  Constitutional: Patient appears well-developed and well-nourished.  He was in mild distress when moving - grimacing  HEENT: head atraumatic, normocephalic, pupils equal and reactive to light,  neck supple Cardiovascular: Normal rate, regular rhythm and normal heart sounds.  No murmur heard. No BLE edema. Pulmonary/Chest: Effort normal and breath sounds normal. No respiratory distress. Abdominal: Soft.  There is no tenderness. Muscular skeletal: very tender everywhere on lumbar spine during palpation, unable to lift his right lower leg, grimacing with rom of spine Psychiatric: Patient has a normal mood and affect. behavior is normal. Judgment and thought content normal.    PHQ2/9:    10/21/2021    8:48 AM 08/11/2021    2:10 PM 07/08/2021    2:50 PM 05/28/2021    3:11 PM 05/07/2021    8:38 AM  Depression screen PHQ 2/9  Decreased Interest 0 0 1 0 0  Down, Depressed, Hopeless '3 3 1 '$ 0 0  PHQ - 2 Score '3 3 2 '$ 0 0  Altered sleeping 0 3 0 0   Tired, decreased energy 0 0 0 0   Change in appetite 0 0 0 0   Feeling bad or failure about yourself  0 0 0 0   Trouble concentrating 0 0 0 0   Moving slowly or fidgety/restless 0 0 0 0   Suicidal thoughts 0 0 0 0   PHQ-9 Score '3 6 2 '$ 0      phq 9 is positive   Fall Risk:    10/21/2021    8:47 AM 08/11/2021    2:10 PM 07/08/2021    2:50 PM 05/28/2021    2:59 PM 05/13/2021    8:45 AM  Fall Risk   Falls in the past year? 0 1 1 0 0  Number falls in past yr: 0 0 0    Injury with Fall? 0 0 1    Risk for fall due to : No Fall Risks No Fall Risks No Fall Risks    Follow up Falls prevention discussed Falls prevention discussed Falls prevention discussed Falls prevention discussed  Functional Status Survey: Is the patient deaf or have difficulty hearing?: No Does the patient have difficulty seeing, even when wearing glasses/contacts?: No Does the patient have difficulty concentrating, remembering, or making decisions?: No Does the patient have difficulty walking or climbing stairs?: Yes Does the patient have difficulty dressing or bathing?: No Does the patient have difficulty doing errands alone such as visiting a doctor's office or shopping?: No    Assessment & Plan  1. Chronic radicular lumbar pain  - pregabalin (LYRICA) 75 MG capsule; Take 1 capsule (75 mg total) by mouth 3 (three) times daily.  Dispense: 90 capsule; Refill: 0  2. Atherosclerosis of aorta (HCC)  - atorvastatin (LIPITOR) 40 MG tablet; Take 1 tablet (40 mg total) by mouth daily.  Dispense: 90 tablet; Refill: 1  3. Pure hypercholesterolemia  - atorvastatin (LIPITOR) 40 MG tablet; Take 1 tablet (40 mg total) by mouth daily.  Dispense: 90 tablet; Refill: 1  4. Lumbar radiculopathy  - pregabalin (LYRICA) 75 MG capsule; Take 1 capsule (75 mg total) by mouth 3 (three) times daily.  Dispense: 90 capsule; Refill: 0  5. Lumbar foraminal stenosis (Right L3/4)  He will contact neurosurgeon, waiting for spinal stimulator

## 2021-10-21 ENCOUNTER — Ambulatory Visit (INDEPENDENT_AMBULATORY_CARE_PROVIDER_SITE_OTHER): Payer: BC Managed Care – PPO | Admitting: Family Medicine

## 2021-10-21 ENCOUNTER — Encounter: Payer: Self-pay | Admitting: Family Medicine

## 2021-10-21 VITALS — BP 116/72 | HR 90 | Resp 16 | Ht 73.0 in | Wt 194.0 lb

## 2021-10-21 DIAGNOSIS — M48061 Spinal stenosis, lumbar region without neurogenic claudication: Secondary | ICD-10-CM | POA: Diagnosis not present

## 2021-10-21 DIAGNOSIS — M5416 Radiculopathy, lumbar region: Secondary | ICD-10-CM

## 2021-10-21 DIAGNOSIS — G8929 Other chronic pain: Secondary | ICD-10-CM

## 2021-10-21 DIAGNOSIS — E7800 Pure hypercholesterolemia, unspecified: Secondary | ICD-10-CM | POA: Diagnosis not present

## 2021-10-21 DIAGNOSIS — I700 Atherosclerosis of aorta: Secondary | ICD-10-CM | POA: Diagnosis not present

## 2021-10-21 MED ORDER — ATORVASTATIN CALCIUM 40 MG PO TABS: 40.0000 mg | ORAL_TABLET | Freq: Every day | ORAL | 1 refills | Status: DC

## 2021-10-21 MED ORDER — PREGABALIN 75 MG PO CAPS: 75.0000 mg | ORAL_CAPSULE | Freq: Three times a day (TID) | ORAL | 0 refills | Status: DC

## 2021-10-24 DIAGNOSIS — Z419 Encounter for procedure for purposes other than remedying health state, unspecified: Secondary | ICD-10-CM | POA: Diagnosis not present

## 2021-11-05 ENCOUNTER — Encounter: Payer: Self-pay | Admitting: Psychology

## 2021-11-05 ENCOUNTER — Encounter: Payer: BC Managed Care – PPO | Attending: Psychology | Admitting: Psychology

## 2021-11-05 DIAGNOSIS — F4323 Adjustment disorder with mixed anxiety and depressed mood: Secondary | ICD-10-CM | POA: Diagnosis not present

## 2021-11-05 DIAGNOSIS — M961 Postlaminectomy syndrome, not elsewhere classified: Secondary | ICD-10-CM | POA: Diagnosis not present

## 2021-11-05 DIAGNOSIS — G894 Chronic pain syndrome: Secondary | ICD-10-CM

## 2021-11-05 DIAGNOSIS — M48061 Spinal stenosis, lumbar region without neurogenic claudication: Secondary | ICD-10-CM

## 2021-11-05 NOTE — Progress Notes (Signed)
Neuropsychological Consultation   Patient:   Wesley Adams   DOB:   1965/06/24  MR Number:  [id number]  Location:  [facility] [place], [place] [id number] [place] [address] Dept: [phone]           Date of Service:   11/05/2021  Start Time:   3 PM End Time:   5 PM  Provider/Observer:  Wesley Adams, Psy.D.       Clinical Neuropsychologist       Billing Code/Service: [phone]   Reason for Service:  Wesley Adams is a 56 year old male referred for for a psychological evaluation as part of a standard psychological work-up for consideration of spinal cord stimulator trialing and possible implantation.  The patient has a history of back injury roughly in 2016 with degenerative disc and chronic pain symptoms.  The patient had previous back surgery and also had a spinal cord stimulator implantation in 2016 through Dr. Arnoldo Morale.  However, this was second-generation larger stimulator controlling box that created a lot of irritation and difficulties and pain itself of where it was put.  Eventually it was taken out even though the patient reports that it did help with his pain but there were a lot of other problems that were unrelated to the effectiveness of the spinal cord stimulator.  The patient has had ongoing treatment for his chronic pain as well including attempts at various medications.  The patient has been followed by Wesley Adams for physical medicine/physiatry input regarding his chronic pain and is been seen by neurosurgery with Wesley Guerin, MD.  The patient reports that he was doing better for a period of time after the issues with his back earlier.  He was back to work and doing well although he did have some residual pain.  He reports that at some point he bent down to pick something up and tripped slightly and had a another injury and that "everything is been  downhill after that."  He has had multiple spinal injections without much relief.  The patient reports that now with his inability to work and patient being on long-term disability currently after Workmen's Compensation denied his claim he has had other stressors.  The patient is now looking at losing his job because he has been out so long even though he really would like to return back to his job and enjoyed his job.  The patient reports that he has had some increase in depression due to pain and not being able to do things with his 4 children.  He knowledges increased irritability and other symptoms that would be consistent with the sequela of chronic pain.  The patient reports that his pain negatively impacts his ability to sleep and that he will sleep for an hour or 2 and then wake up suddenly with pain.  The patient reports that he cannot stand or lay down in particular position for very long before pain becomes very problematic.  The patient reports that he typically gets 4 to 5 hours of sleep at night and that pain triggers him to wake.  The patient has difficulty walking, climbing and standing.  The patient reports that his appetite is fine and memory is fine.  The patient denies any other significant psychosocial stressors going on.  He spends most of his time with his children and they are his "heart."  The patient reports that his most recent injury  happened on November 11, 2020 and he was out of work for 2 weeks initially but was not able to return to work.  By October with some many occurrences of having to go to the doctor he was taken out of work and placed on short-term disability in October 2022.  The patient is now transition to long-term disability.  His employer has told him they are looking at potentially ending his employment.  At first with this most recent injury Worker's Compensation and said that they would cover his medical bills but they later denied it there has been some ongoing  litigation about the issue of whether Workmen's Comp. is responsible or not.  The case is still in limbo.  The patient does acknowledge significant stressors that are generally managed.  This includes his back injury and difficulties around that, potential job termination, change in financial status.  Behavioral Observation: Wesley Adams  presents as a 56 y.o.-year-old Right handed African American Male who appeared his stated age. his dress was Appropriate and he was Well Groomed and his manners were Appropriate to the situation.  his participation was indicative of Appropriate and Attentive behaviors.  There were physical disabilities noted.  he displayed an appropriate level of cooperation and motivation.     Interactions:    Active Appropriate  Attention:   within normal limits and attention span and concentration were age appropriate  Memory:   within normal limits; recent and remote memory intact  Visuo-spatial:  not examined  Speech (Volume):  normal  Speech:   normal; normal  Thought Process:  Coherent and Relevant  Though Content:  WNL; not suicidal and not homicidal  Orientation:   person, place, time/date, and situation  Judgment:   Good  Planning:   Good  Affect:    Appropriate  Mood:    Dysphoric  Insight:   Good  Intelligence:   normal  Marital Status/Living: The patient was born and raised in [place] along with 3 siblings.  The patient currently lives with his 4 children.  He is single.  The patient's children are age 1, 48, 82 and 41.  Current Employment: The patient is still employed at this time at [employer] and [employer].  He has worked there for the past 5 years and really enjoys this job.  Hobbies and interest have included sports with his kids.  Substance Use:  No concerns of substance abuse are reported.  The patient does acknowledge some use of tobacco products.  Education:   HS Graduate  Medical History:   Past  Medical History:  Diagnosis Date   Chronic back pain    Flu    9/19   Spinal cord stimulator status 04/29/2017   [facility] Scientific Spinal Cord Stimulator placed by Dr. Newman Pies 03/23/2017.         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  Psychiatric History:  No prior psychiatric history.  Family Med/Psych History:  Family History  Problem Relation Age of Onset   Alcohol abuse Mother    Heart attack Father    Heart disease Father    Obesity Father    Heart attack Paternal Grandfather     Risk of Suicide/Violence: virtually non-existent patient denies any suicidal or homicidal ideation.  Impression/DX:  Wesley Adams is a 56 year old male referred for for a psychological evaluation as part of a standard psychological work-up for consideration of spinal cord stimulator trialing and possible implantation.  The patient has a history of back injury roughly in 2016 with degenerative disc and chronic pain symptoms.  The patient had previous back surgery and also had a spinal cord stimulator implantation in 2016 through Dr. Arnoldo Morale.  However, this was second-generation larger stimulator controlling box that created a lot of irritation and difficulties and pain itself of where it was put.  Eventually it was taken out even though the patient  reports that it did help with his pain but there were a lot of other problems that were unrelated to the effectiveness of the spinal cord stimulator.  The patient has had ongoing treatment for his chronic pain as well including attempts at various medications.  The patient has been followed by Wesley Adams for physical medicine/physiatry input regarding his chronic pain and is been seen by neurosurgery with Wesley Guerin, MD.  Disposition/Plan:  Beyond formal face-to-face clinical interview, the patient will also complete the [facility] multiphasic personality inventory as well as the pain patient profile.  Once these objective assessments have been completed a formal report will be produced with particular focus on specific question being asked by insurance provider.  Namely, the degree or presence of psychosocial variables are psychological/psychiatric variables that would either him.  His ability to appropriately assess responses during the trialing phase and follow through with medical requirements for implantation.  Diagnosis:    Adjustment reaction with anxiety and depression  Lumbar foraminal stenosis  Failed back surgical syndrome  Chronic pain syndrome         Electronically Signed   _______________________ Wesley Adams, Psy.D. Clinical Neuropsychologist

## 2021-11-06 ENCOUNTER — Ambulatory Visit (INDEPENDENT_AMBULATORY_CARE_PROVIDER_SITE_OTHER): Payer: BC Managed Care – PPO | Admitting: Podiatry

## 2021-11-06 DIAGNOSIS — Q666 Other congenital valgus deformities of feet: Secondary | ICD-10-CM

## 2021-11-06 DIAGNOSIS — M722 Plantar fascial fibromatosis: Secondary | ICD-10-CM

## 2021-11-06 NOTE — Progress Notes (Signed)
Patient presents to pick up orthotics.  Wearing instructions were given.  Patient was advised to call me if he has any questions or concerns.

## 2021-11-06 NOTE — Patient Instructions (Signed)

## 2021-11-24 DIAGNOSIS — Z419 Encounter for procedure for purposes other than remedying health state, unspecified: Secondary | ICD-10-CM | POA: Diagnosis not present

## 2021-11-28 ENCOUNTER — Other Ambulatory Visit: Payer: Self-pay | Admitting: Family Medicine

## 2021-11-28 DIAGNOSIS — G8929 Other chronic pain: Secondary | ICD-10-CM

## 2021-11-28 DIAGNOSIS — M62838 Other muscle spasm: Secondary | ICD-10-CM

## 2021-11-30 ENCOUNTER — Ambulatory Visit: Payer: BC Managed Care – PPO | Admitting: Student in an Organized Health Care Education/Training Program

## 2021-12-25 DIAGNOSIS — Z419 Encounter for procedure for purposes other than remedying health state, unspecified: Secondary | ICD-10-CM | POA: Diagnosis not present

## 2022-01-20 NOTE — Progress Notes (Signed)
Name: Wesley Adams   MRN: [id number]    DOB: 06-03-1965   Date:01/21/2022       Progress Note  Subjective  Chief Complaint  Follow Up  HPI  Lumbar Radiculopathy/Chronic pain syndrome: he has a long history of back pain, he had posterior laminectomy and decompression lumbar spine by Dr. Arnoldo Morale back in 01/2015, after that he got a spinal stimulator in 02/2017 however it was very bulky and bothersome so he had it removed in 05/2017. He is is seeing pain doctor - Dr. Holley Raring , he had two epidural injections with temporary relieve - a few days only. He saw  Dr. Renda Rolls at [facility]  and was referred for PT, he completed 19 sessions but continues to have pain.Marland Kitchen He has been out of work since 02/19/21 ( he works at a [employer] ). Dr. Renda Rolls thinks is is chronic right SI joint pain , he had CT sacroiliac joint injection right side on 06/11/21  but he states pain did not improve.  He  saw Dr. Cari Caraway - neurosurgeon and was advised to have spinal stimulator. He states the pain is worse , constant , waiting for insurance to approve another spinal stimulator. The pain is affecting his mood and he is always irritable. He states now radiating to lateral hips. Cannot walk a quarter of a mild before he stop due to increase in pain level. He is currently on celebrex, lyrica and tizanidine  IMPRESSION: MRI thoracic spine  1. No acute osseous abnormality in the thoracic spine. Chronic postoperative changes posteriorly at T8-T9 likely from prior spinal cord stimulator. 2. Intermittent thoracic disc bulging and facet hypertrophy, but no significant thoracic spinal stenosis. Up to mild neural foraminal stenosis at the right T1, left T10, and bilateral T12 nerve levels. 3. Thoracic spinal cord within normal limits. 4. 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 This is a normal anatomic variant but can predispose to dysphagia lusoria. 5. 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    IMPRESSION:CT lumbar spine wo contrast  1. Prior L4-L5 decompression and fusion with solid arthrodesis and no adverse features. 2. Adjacent segment disease at L3-L4 as well as L1-L2 and L2-L3 lumbar degeneration with spinal stenosis and foraminal stenosis appears stable since the MRI last year. 3. Aortic Atherosclerosis (ICD10-I70.0).  Atherosclerosis aorta/iliac artery stenosis/CAD: advised him to stop smoking, continue Atorvastatin , and aspirin 81 mg. He denies side effects of medication. He has CAD but denies chest pain or SOB. 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   Lumbar radiculopathy 11/25/2020   Lumbar spondylosis 05/03/2018   Coronary artery disease involving native heart 12/12/2017   Iliac artery stenosis, left ([place]) 12/06/2017   Atherosclerosis of aorta ([place]) 12/06/2017   Chronic bilateral low back pain with bilateral sciatica 10/03/2017   Chronic pain syndrome 10/03/2017   Tobacco use 05/27/2017   Other intervertebral disc degeneration, lumbar region 01/30/2015    Past Surgical History:  Procedure Laterality Date   COLONOSCOPY WITH PROPOFOL N/A 06/01/2017   Procedure: COLONOSCOPY WITH PROPOFOL;  Surgeon: Lin Landsman, MD;  Location: [place];  Service: Endoscopy;  Laterality: N/A;   ESOPHAGOGASTRODUODENOSCOPY (EGD) WITH PROPOFOL  06/01/2017   Procedure: ESOPHAGOGASTRODUODENOSCOPY (EGD) WITH PROPOFOL;  Surgeon: Lin Landsman, MD;  Location: [facility];  Service: Endoscopy;;   FEMUR FRACTURE SURGERY Right 1985   he has a rod   LEFT HEART CATH AND CORONARY ANGIOGRAPHY N/A 12/08/2017   Procedure: LEFT HEART CATH AND CORONARY ANGIOGRAPHY;  Surgeon: Corey Skains, MD;  Location: [place] [facility];  Service: Cardiovascular;   Laterality: N/A;   POLYPECTOMY  06/01/2017   Procedure: POLYPECTOMY;  Surgeon: Lin Landsman, MD;  Location: [place];  Service: Endoscopy;;   POSTERIOR LAMINECTOMY / DECOMPRESSION LUMBAR SPINE  01/30/2015   with fusion, Dr. Arnoldo Morale   SPINAL CORD STIMULATOR INSERTION  03/23/2017   Dr Newman Pies, [facility] Neurosurgery   [place] N/A 06/16/2017   Procedure: SPINAL CORD STIMULATOR REMOVAL;  Surgeon: Newman Pies, MD;  Location: [place];  Service: Neurosurgery;  Laterality: N/A;    Family History  Problem Relation Age of Onset   Alcohol abuse Mother    Heart attack Father    Heart disease Father    Obesity Father    Heart attack Paternal Grandfather     Social History   Tobacco Use   Smoking status: Every Day    Packs/day: 1.00    Years: 35.00    Total pack years: 35.00    Types: Cigarettes    Start date: 08/02/1983   Smokeless tobacco: Never  Substance Use Topics   Alcohol use: Yes    Alcohol/week: 0.0 standard drinks of alcohol    Comment: occasionally drinks beer     Current Outpatient Medications:    atorvastatin (LIPITOR) 40 MG tablet, Take 1 tablet (40 mg total) by mouth daily., Disp: 90 tablet, Rfl: 1   celecoxib (CELEBREX) 100 MG capsule, Take 100 mg by mouth 2 (two) times daily., Disp: , Rfl:    tiZANidine (ZANAFLEX) 4 MG tablet, TAKE 1 TABLET(4 MG) BY MOUTH EVERY 8 HOURS AS NEEDED FOR MUSCLE SPASMS, Disp: 90 tablet, Rfl: 0   pregabalin (LYRICA) 75 MG capsule, Take 1 capsule (75 mg total) by mouth 3 (three) times daily., Disp: 90 capsule, Rfl: 0  No Known Allergies  I personally reviewed active problem list, medication list, allergies, family history, social history, health maintenance with the patient/caregiver today.   ROS  Constitutional: Negative for fever or weight change.  Respiratory: Negative for cough and shortness of breath.   Cardiovascular: Negative for chest pain or palpitations.  Gastrointestinal:  Negative for abdominal pain, no bowel changes.  Musculoskeletal: Negative for gait problem or joint swelling.  Skin: Negative for rash.  Neurological: Negative for dizziness or headache.  No other specific complaints in a complete review of systems (except as listed in HPI above).   Objective  Vitals:   01/21/22 0800  BP: 138/70  Pulse: 69  Resp: 16  SpO2: 100%  Weight: 191 lb (86.6 kg)  Height: '6\' 1"'$  (1.854 m)    Body mass index is 25.2 kg/m.  Physical Exam  Constitutional: Patient appears well-developed and well-nourished.  No distress.  HEENT: head atraumatic, normocephalic, pupils equal and reactive to light, neck supple Cardiovascular: Normal rate, regular rhythm and normal heart sounds.  No murmur heard. No BLE edema. Pulmonary/Chest: Effort normal and breath sounds normal. No respiratory distress. Abdominal: Soft.  There is no tenderness. Muscular skeletal: tender during palpation of thoracic and lumbar spine, pain with rom and difficulty raising legs while sitting  Psychiatric: Patient has a normal mood and affect. behavior is normal. Judgment and thought content normal.    PHQ2/9:    01/21/2022  7:59 AM 10/21/2021    8:48 AM 08/11/2021    2:10 PM 07/08/2021    2:50 PM 05/28/2021    3:11 PM  Depression screen PHQ 2/9  Decreased Interest 2 0 0 1 0  Down, Depressed, Hopeless '2 3 3 1 '$ 0  PHQ - 2 Score '4 3 3 2 '$ 0  Altered sleeping 1 0 3 0 0  Tired, decreased energy 0 0 0 0 0  Change in appetite 0 0 0 0 0  Feeling bad or failure about yourself  1 0 0 0 0  Trouble concentrating 0 0 0 0 0  Moving slowly or fidgety/restless 0 0 0 0 0  Suicidal thoughts 0 0 0 0 0  PHQ-9 Score '6 3 6 2 '$ 0    phq 9 is positive   Fall Risk:    01/21/2022    7:59 AM 10/21/2021    8:47 AM 08/11/2021    2:10 PM 07/08/2021    2:50 PM 05/28/2021    2:59 PM  Fall Risk   Falls in the past year? 0 0 1 1 0  Number falls in past yr: 0 0 0 0   Injury with Fall? 0 0 0 1   Risk for fall due to :  No Fall Risks No Fall Risks No Fall Risks No Fall Risks   Follow up Falls prevention discussed Falls prevention discussed Falls prevention discussed Falls prevention discussed Falls prevention discussed      Functional Status Survey: Is the patient deaf or have difficulty hearing?: No Does the patient have difficulty seeing, even when wearing glasses/contacts?: No Does the patient have difficulty concentrating, remembering, or making decisions?: No Does the patient have difficulty walking or climbing stairs?: Yes Does the patient have difficulty dressing or bathing?: No Does the patient have difficulty doing errands alone such as visiting a doctor's office or shopping?: No    Assessment & Plan  1. Need for immunization against influenza  - Flu Vaccine QUAD 6+ mos PF IM (Fluarix Quad PF)  2. Chronic radicular lumbar pain  - DULoxetine (CYMBALTA) 30 MG capsule; Take 1-2 capsules (30-60 mg total) by mouth daily.  Dispense: 60 capsule; Refill: 0 - DULoxetine (CYMBALTA) 60 MG capsule; Take 1 capsule (60 mg total) by mouth daily.  Dispense: 90 capsule; Refill: 0  3. Atherosclerosis of aorta (HCC)  Continue statin therapy   4. Pure hypercholesterolemia  Continue statin therapy   5. Dysthymia  - DULoxetine (CYMBALTA) 30 MG capsule; Take 1-2 capsules (30-60 mg total) by mouth daily.  Dispense: 60 capsule; Refill: 0 - DULoxetine (CYMBALTA) 60 MG capsule; Take 1 capsule (60 mg total) by mouth daily.  Dispense: 90 capsule; Refill: 0

## 2022-01-21 ENCOUNTER — Ambulatory Visit (INDEPENDENT_AMBULATORY_CARE_PROVIDER_SITE_OTHER): Payer: Medicaid Other | Admitting: Family Medicine

## 2022-01-21 ENCOUNTER — Encounter: Payer: Self-pay | Admitting: Family Medicine

## 2022-01-21 VITALS — BP 138/70 | HR 69 | Resp 16 | Ht 73.0 in | Wt 191.0 lb

## 2022-01-21 DIAGNOSIS — F341 Dysthymic disorder: Secondary | ICD-10-CM | POA: Diagnosis not present

## 2022-01-21 DIAGNOSIS — G8929 Other chronic pain: Secondary | ICD-10-CM

## 2022-01-21 DIAGNOSIS — M5416 Radiculopathy, lumbar region: Secondary | ICD-10-CM

## 2022-01-21 DIAGNOSIS — Z23 Encounter for immunization: Secondary | ICD-10-CM | POA: Diagnosis not present

## 2022-01-21 DIAGNOSIS — E7800 Pure hypercholesterolemia, unspecified: Secondary | ICD-10-CM

## 2022-01-21 DIAGNOSIS — I700 Atherosclerosis of aorta: Secondary | ICD-10-CM | POA: Diagnosis not present

## 2022-01-21 DIAGNOSIS — Z122 Encounter for screening for malignant neoplasm of respiratory organs: Secondary | ICD-10-CM

## 2022-01-21 MED ORDER — DULOXETINE HCL 60 MG PO CPEP: 60.0000 mg | ORAL_CAPSULE | Freq: Every day | ORAL | 0 refills | Status: DC

## 2022-01-21 MED ORDER — DULOXETINE HCL 30 MG PO CPEP: 30.0000 mg | ORAL_CAPSULE | Freq: Every day | ORAL | 0 refills | Status: DC

## 2022-01-24 DIAGNOSIS — Z419 Encounter for procedure for purposes other than remedying health state, unspecified: Secondary | ICD-10-CM | POA: Diagnosis not present
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
## 2022-02-02 NOTE — Patient Instructions (Signed)

## 2022-02-03 ENCOUNTER — Ambulatory Visit (INDEPENDENT_AMBULATORY_CARE_PROVIDER_SITE_OTHER): Payer: Medicaid Other | Admitting: Family Medicine

## 2022-02-03 ENCOUNTER — Encounter: Payer: Self-pay | Admitting: Family Medicine

## 2022-02-03 VITALS — BP 134/72 | HR 75 | Temp 97.9°F | Resp 18 | Ht 73.0 in | Wt 186.4 lb

## 2022-02-03 DIAGNOSIS — Z79899 Other long term (current) drug therapy: Secondary | ICD-10-CM | POA: Diagnosis not present

## 2022-02-03 DIAGNOSIS — Z0000 Encounter for general adult medical examination without abnormal findings: Secondary | ICD-10-CM | POA: Diagnosis not present

## 2022-02-03 DIAGNOSIS — Z23 Encounter for immunization: Secondary | ICD-10-CM | POA: Diagnosis not present

## 2022-02-03 DIAGNOSIS — E7800 Pure hypercholesterolemia, unspecified: Secondary | ICD-10-CM

## 2022-02-03 DIAGNOSIS — Z131 Encounter for screening for diabetes mellitus: Secondary | ICD-10-CM

## 2022-02-04 LAB — COMPLETE METABOLIC PANEL WITH GFR
AG Ratio: 1.8 (calc) (ref 1.0–2.5)
ALT: 12 U/L (ref 9–46)
AST: 18 U/L (ref 10–35)
Albumin: 4.9 g/dL (ref 3.6–5.1)
Alkaline phosphatase (APISO): 60 U/L (ref 35–144)
BUN: 11 mg/dL (ref 7–25)
CO2: 30 mmol/L (ref 20–32)
Calcium: 10.1 mg/dL (ref 8.6–10.3)
Chloride: 104 mmol/L (ref 98–110)
Creat: 0.91 mg/dL (ref 0.70–1.30)
Globulin: 2.8 g/dL (calc) (ref 1.9–3.7)
Glucose, Bld: 87 mg/dL (ref 65–139)
Potassium: 4.7 mmol/L (ref 3.5–5.3)
Sodium: 142 mmol/L (ref 135–146)
Total Bilirubin: 0.6 mg/dL (ref 0.2–1.2)
Total Protein: 7.7 g/dL (ref 6.1–8.1)
eGFR: 99 mL/min/{1.73_m2} (ref >=60)

## 2022-02-04 LAB — LIPID PANEL
Cholesterol: 223 mg/dL — ABNORMAL HIGH (ref <200)
HDL: 72 mg/dL (ref >=40)
LDL Cholesterol (Calc): 134 mg/dL (calc) — ABNORMAL HIGH
Non-HDL Cholesterol (Calc): 151 mg/dL (calc) — ABNORMAL HIGH (ref <130)
Total CHOL/HDL Ratio: 3.1 (calc) (ref <5.0)
Triglycerides: 74 mg/dL (ref <150)

## 2022-02-04 LAB — CBC WITH DIFFERENTIAL/PLATELET
Absolute Monocytes: 723 cells/uL (ref 200–950)
Basophils Absolute: 17 cells/uL (ref 0–200)
Basophils Relative: 0.2 %
Eosinophils Absolute: 43 cells/uL (ref 15–500)
Eosinophils Relative: 0.5 %
HCT: 48.6 % (ref 38.5–50.0)
Hemoglobin: 16.6 g/dL (ref 13.2–17.1)
Lymphs Abs: 2125 cells/uL (ref 850–3900)
MCH: 31.6 pg (ref 27.0–33.0)
MCHC: 34.2 g/dL (ref 32.0–36.0)
MCV: 92.4 fL (ref 80.0–100.0)
MPV: 11.2 fL (ref 7.5–12.5)
Monocytes Relative: 8.5 %
Neutro Abs: 5593 cells/uL (ref 1500–7800)
Neutrophils Relative %: 65.8 %
Platelets: 240 10*3/uL (ref 140–400)
RBC: 5.26 10*6/uL (ref 4.20–5.80)
RDW: 13.9 % (ref 11.0–15.0)
Total Lymphocyte: 25 %
WBC: 8.5 10*3/uL (ref 3.8–10.8)

## 2022-02-04 LAB — HEMOGLOBIN A1C
Hgb A1c MFr Bld: 5.5 % of total Hgb (ref <5.7)
Mean Plasma Glucose: 111 mg/dL
eAG (mmol/L): 6.2 mmol/L

## 2022-02-04 LAB — HEPATITIS C ANTIBODY: Hepatitis C Ab: NONREACTIVE

## 2022-02-24 DIAGNOSIS — Z419 Encounter for procedure for purposes other than remedying health state, unspecified: Secondary | ICD-10-CM | POA: Diagnosis not present

## 2022-03-08 ENCOUNTER — Other Ambulatory Visit: Payer: Self-pay

## 2022-03-08 DIAGNOSIS — Z122 Encounter for screening for malignant neoplasm of respiratory organs: Secondary | ICD-10-CM

## 2022-03-08 DIAGNOSIS — F17210 Nicotine dependence, cigarettes, uncomplicated: Secondary | ICD-10-CM

## 2022-03-08 DIAGNOSIS — Z87891 Personal history of nicotine dependence: Secondary | ICD-10-CM

## 2022-03-17 ENCOUNTER — Telehealth: Payer: Self-pay

## 2022-03-17 NOTE — Telephone Encounter (Signed)
Just to document what has been going on with the SCS trial order. The patient told us he had a previous SCS implant with Dr. Arnoldo Morale and it was removed. DR. Holley Raring needs the notes from this procedure as well as miyself since I will need to get prior auth. I sent a request to Dr. Arnoldo Morale office but the notes I received didn't indicate an implant or removal. The patient told me in 3 different phone calls that he was going to get the notes from their office and bring them to me. In retrospect he wasn't exactly sure who did the implant and removal. I spoke to him last on 01/20/22 and he said he would get the notes to me. I never received the notes so therefore I am unable to proceed with trying to get prior authorization for a new SCS. After speaking with Dr. Holley Raring, he stated that since the patient already had a trial then he should be able to skip straight to the implant. I am going to file the notes that I have, since there seems to be nothing else I can do at this point.

## 2022-03-26 DIAGNOSIS — Z419 Encounter for procedure for purposes other than remedying health state, unspecified: Secondary | ICD-10-CM | POA: Diagnosis not present

## 2022-04-02 ENCOUNTER — Encounter: Payer: Self-pay | Admitting: Primary Care

## 2022-04-02 ENCOUNTER — Ambulatory Visit (INDEPENDENT_AMBULATORY_CARE_PROVIDER_SITE_OTHER): Payer: Medicaid Other | Admitting: Primary Care

## 2022-04-02 DIAGNOSIS — F17200 Nicotine dependence, unspecified, uncomplicated: Secondary | ICD-10-CM

## 2022-04-02 NOTE — Patient Instructions (Signed)
Thank you for participating in the [facility]. It was our pleasure to meet you today. We will call you with the results of your scan within the next few days. Your scan will be assigned a Lung RADS category score by the physicians reading the scans.  This Lung RADS score determines follow up scanning.  See below for description of categories, and follow up screening recommendations. We will be in touch to schedule your follow up screening annually or based on recommendations of our providers. We will fax a copy of your scan results to your Primary Care Physician, or the physician who referred you to the program, to ensure they have the results. Please call the office if you have any questions or concerns regarding your scanning experience or results.  Our office number is [phone]. Please speak with Denise Phelps, RN. , or  Denise Buckner RN, They are  our Lung Cancer Screening RN.'s If They are unavailable when you call, Please leave a message on the voice mail. We will return your call at our earliest convenience.This voice mail is monitored several times a day.  Remember, if your scan is normal, we will scan you annually as long as you continue to meet the criteria for the program. (Age 55-77, Current smoker or smoker who has quit within the last 15 years). If you are a smoker, remember, quitting is the single most powerful action that you can take to decrease your risk of lung cancer and other pulmonary, breathing related problems. We know quitting is hard, and we are here to help.  Please let us know if there is anything we can do to help you meet your goal of quitting. If you are a former smoker, congratulations. We are proud of you! Remain smoke free! Remember you can refer friends or family members through the number above.  We will screen them to make sure they meet criteria for the program. Thank you for helping us take better care of you by  participating in Lung Screening.  You can receive free nicotine replacement therapy ( patches, gum or mints) by calling 1-800-QUIT NOW. Please call so we can get you on the path to becoming  a non-smoker. I know it is hard, but you can do this!  Lung RADS Categories:  Lung RADS 1: no nodules or definitely non-concerning nodules.  Recommendation is for a repeat annual scan in 12 months.  Lung RADS 2:  nodules that are non-concerning in appearance and behavior with a very low likelihood of becoming an active cancer. Recommendation is for a repeat annual scan in 12 months.  Lung RADS 3: nodules that are probably non-concerning , includes nodules with a low likelihood of becoming an active cancer.  Recommendation is for a 6-month repeat screening scan. Often noted after an upper respiratory illness. We will be in touch to make sure you have no questions, and to schedule your 6-month scan.  Lung RADS 4 A: nodules with concerning findings, recommendation is most often for a follow up scan in 3 months or additional testing based on our provider's assessment of the scan. We will be in touch to make sure you have no questions and to schedule the recommended 3 month follow up scan.  Lung RADS 4 B:  indicates findings that are concerning. We will be in touch with you to schedule additional diagnostic testing based on our provider's  assessment of the scan.  Other options for assistance in smoking cessation (   As covered by your insurance benefits)  Hypnosis for smoking cessation  [employer] [phone]  Acupuncture for smoking cessation  [facility] [phone]   

## 2022-04-02 NOTE — Progress Notes (Signed)
Shared Decision Making Visit Lung Cancer Screening Program [phone])   Eligibility: Age 56 y.o. Pack Years Smoking History Calculation 39 (# packs/per year x # years smoked) Recent History of coughing up blood  no Unexplained weight loss? no ( >Than 15 pounds within the last 6 months ) Prior History Lung / other cancer no (Diagnosis within the last 5 years already requiring surveillance chest CT Scans). Smoking Status Current Smoker Former Smokers: Years since quit: NA  Quit Date: NA  Visit Components: Discussion included one or more decision making aids. yes Discussion included risk/benefits of screening. yes Discussion included potential follow up diagnostic testing for abnormal scans. yes Discussion included meaning and risk of over diagnosis. yes Discussion included meaning and risk of False Positives. yes Discussion included meaning of total radiation exposure. yes  Counseling Included: Importance of adherence to annual lung cancer LDCT screening. yes Impact of comorbidities on ability to participate in the program. yes Ability and willingness to under diagnostic treatment. yes  Smoking Cessation Counseling: Current Smokers:  Discussed importance of smoking cessation. yes Information about tobacco cessation classes and interventions provided to patient. yes Patient provided with "ticket" for LDCT Scan. yes Symptomatic Patient. no  Counseling(Intermediate counseling: > three minutes) 99406 Diagnosis Code: Tobacco Use Z72.0 Asymptomatic Patient yes  Counseling (Intermediate counseling: > three minutes counseling) W7371 Former Smokers:  Discussed the importance of maintaining cigarette abstinence. yes Diagnosis Code: Personal History of Nicotine Dependence. G62.694 Information about tobacco cessation classes and interventions provided to patient. Yes Patient provided with "ticket" for LDCT Scan. yes Written Order for Lung Cancer Screening with LDCT placed in Epic.  Yes (CT Chest Lung Cancer Screening Low Dose W/O CM) [id number] Z12.2-Screening of respiratory organs Z87.891-Personal history of nicotine dependence  I have spent 25 minutes of face to face/ virtual visit time with Wesley Adams discussing the risks and benefits of lung cancer screening. We viewed / discussed a power point together that explained in detail the above noted topics. We paused at intervals to allow for questions to be asked and answered to ensure understanding.We discussed that the single most powerful action that he can take to decrease  risk of developing lung cancer is to quit smoking. We discussed whether or not he is ready to commit to setting a quit date. We discussed options for tools to aid in quitting smoking including nicotine replacement therapy, non-nicotine medications, support groups, Quit Smart classes, and behavior modification. We discussed that often times setting smaller, more achievable goals, such as eliminating 1 cigarette a day for a week and then 2 cigarettes a day for a week can be helpful in slowly decreasing the number of cigarettes smoked. This allows for a sense of accomplishment as well as providing a clinical benefit. I provided him  with smoking cessation  information  with contact information for community resources, classes, free nicotine replacement therapy, and access to mobile apps, text messaging, and on-line smoking cessation help. I have also provided him  the office contact information in the event he needs to contact me, or the screening staff. We discussed the time and location of the scan, and that either Doroteo Glassman RN, Joella Prince, RN  or I will call / send a letter with the results within 24-72 hours of receiving them. The patient verbalized understanding of all of  the above and had no further questions upon leaving the office. They have my contact information in the event they have any further questions.  I spent 3-5 minutes  counseling on smoking  cessation and the health risks of continued tobacco abuse.  I explained to the patient that there has been a high incidence of coronary artery disease noted on these exams. I explained that this is a non-gated exam therefore degree or severity cannot be determined. This patient is NOT on statin therapy. I have asked the patient to follow-up with their PCP regarding any incidental finding of coronary artery disease and management with diet or medication as their PCP  feels is clinically indicated. The patient verbalized understanding of the above and had no further questions upon completion of the visit.   Wesley Ehrich, NP

## 2022-04-05 ENCOUNTER — Ambulatory Visit
Admission: RE | Admit: 2022-04-05 | Discharge: 2022-04-05 | Disposition: A | Discharge status: Home / Self Care | Payer: Medicaid Other | Source: Ambulatory Visit | Attending: Acute Care | Admitting: Acute Care

## 2022-04-05 DIAGNOSIS — Z122 Encounter for screening for malignant neoplasm of respiratory organs: Secondary | ICD-10-CM | POA: Diagnosis not present

## 2022-04-05 DIAGNOSIS — Z87891 Personal history of nicotine dependence: Secondary | ICD-10-CM | POA: Diagnosis not present

## 2022-04-05 DIAGNOSIS — F17210 Nicotine dependence, cigarettes, uncomplicated: Secondary | ICD-10-CM | POA: Diagnosis not present

## 2022-04-07 ENCOUNTER — Other Ambulatory Visit: Payer: Self-pay

## 2022-04-07 DIAGNOSIS — Z122 Encounter for screening for malignant neoplasm of respiratory organs: Secondary | ICD-10-CM

## 2022-04-07 DIAGNOSIS — Z87891 Personal history of nicotine dependence: Secondary | ICD-10-CM

## 2022-04-07 DIAGNOSIS — F17210 Nicotine dependence, cigarettes, uncomplicated: Secondary | ICD-10-CM

## 2022-04-15 ENCOUNTER — Ambulatory Visit (INDEPENDENT_AMBULATORY_CARE_PROVIDER_SITE_OTHER): Payer: Medicaid Other | Admitting: Podiatry

## 2022-04-15 DIAGNOSIS — Q666 Other congenital valgus deformities of feet: Secondary | ICD-10-CM | POA: Diagnosis not present

## 2022-04-15 DIAGNOSIS — M722 Plantar fascial fibromatosis: Secondary | ICD-10-CM

## 2022-04-15 NOTE — Progress Notes (Signed)
Subjective:  Patient ID: Wesley Adams, male    DOB: 04-Mar-1966,  MRN: [id number]  Chief Complaint  Patient presents with   Plantar Fasciitis    56 y.o. male presents with the above complaint.  Patient presents with left Planter fasciitis.  Patient states started to act back up again.  He denies any other acute complaints.  He did not pick up his orthotics but he is here to pick them back up again   Review of Systems: Negative except as noted in the HPI. Denies N/V/F/Ch.  Past Medical History:  Diagnosis Date   Chronic back pain    Flu    9/19   Spinal cord stimulator status 04/29/2017   [facility] Scientific Spinal Cord Stimulator placed by Dr. Newman Adams 03/23/2017.    Current Outpatient Medications:    atorvastatin (LIPITOR) 40 MG tablet, Take 1 tablet (40 mg total) by mouth daily. (Patient not taking: Reported on 04/02/2022), Disp: 90 tablet, Rfl: 1   celecoxib (CELEBREX) 100 MG capsule, Take 100 mg by mouth 2 (two) times daily. (Patient not taking: Reported on 04/02/2022), Disp: , Rfl:    DULoxetine (CYMBALTA) 30 MG capsule, Take 1-2 capsules (30-60 mg total) by mouth daily. (Patient not taking: Reported on 04/02/2022), Disp: 60 capsule, Rfl: 0   DULoxetine (CYMBALTA) 60 MG capsule, Take 1 capsule (60 mg total) by mouth daily. (Patient not taking: Reported on 04/02/2022), Disp: 90 capsule, Rfl: 0   pregabalin (LYRICA) 75 MG capsule, Take 1 capsule (75 mg total) by mouth 3 (three) times daily., Disp: 90 capsule, Rfl: 0   tiZANidine (ZANAFLEX) 4 MG tablet, TAKE 1 TABLET(4 MG) BY MOUTH EVERY 8 HOURS AS NEEDED FOR MUSCLE SPASMS, Disp: 90 tablet, Rfl: 0  Social History   Tobacco Use  Smoking Status Every Day   Packs/day: 1.00   Years: 35.00   Total pack years: 35.00   Types: Cigarettes   Start date: 08/02/1983  Smokeless Tobacco Never    No Known Allergies Objective:  There were no vitals filed for this visit. There is no height or weight on file to calculate  BMI. Constitutional Well developed. Well nourished.  Vascular Dorsalis pedis pulses palpable bilaterally. Posterior tibial pulses palpable bilaterally. Capillary refill normal to all digits.  No cyanosis or clubbing noted. Pedal hair growth normal.  Neurologic Normal speech. Oriented to person, place, and time. Epicritic sensation to light touch grossly present bilaterally.  Dermatologic Nails well groomed and normal in appearance. No open wounds. No skin lesions.  Orthopedic: Normal joint ROM without pain or crepitus bilaterally. No visible deformities. Tender to palpation at the calcaneal tuber left. No pain with calcaneal squeeze left. Ankle ROM diminished range of motion left. Silfverskiold Test: positive left.   Radiographs: Taken and reviewed. No acute fractures or dislocations. No evidence of stress fracture.  Plantar heel spur absent. Posterior heel spur absent.  Pes planovalgus foot structure noted  Assessment:   No diagnosis found.  Plan:  Patient was evaluated and treated and all questions answered.  Plantar Fasciitis, left~recurrence - XR reviewed as above.  - Educated on icing and stretching. Instructions given.  - Injection delivered to the plantar fascia as below. - DME: Plantar fascial brace dispensed to support the medial longitudinal arch of the foot and offload pressure from the heel and prevent arch collapse during weightbearing - Pharmacologic management: None  Pes planovalgus -I explained to patient the etiology of pes planovalgus and relationship with Planter fasciitis and various treatment options were discussed.  Given patient foot structure in the setting of Planter fasciitis I believe patient will benefit from custom-made orthotics to help control the hindfoot motion support the arch of the foot and take the stress away from plantar fascial.  Patient agrees with the plan like to proceed with orthotics -Orthotics were dispensed and are functioning  well   Procedure: Injection Tendon/Ligament Location: Left plantar fascia at the glabrous junction; medial approach. Skin Prep: alcohol Injectate: 0.5 cc 0.5% marcaine plain, 0.5 cc of 1% Lidocaine, 0.5 cc kenalog 10. Disposition: Patient tolerated procedure well. Injection site dressed with a band-aid.  No follow-ups on file.

## 2022-04-21 NOTE — Progress Notes (Unsigned)
Name: Wesley Adams   MRN: [id number]    DOB: 1965-11-27   Date:04/22/2022       Progress Note  Subjective  Chief Complaint  Follow Up  HPI  Lumbar Radiculopathy/Chronic pain syndrome: he has a long history of back pain, he had posterior laminectomy and decompression lumbar spine by Dr. Arnoldo Morale back in 01/2015, after that he got a spinal stimulator in 02/2017 however it was very bulky and bothersome so he had it removed in 05/2017. He is is seeing pain doctor - Dr. Holley Raring , he had two epidural injections with temporary relieve - a few days only. He saw  Dr. Renda Rolls at [facility]  and was referred for PT, he completed 19 sessions but continues to have pain.Marland Kitchen He has been out of work since 02/19/21 ( he works at a [employer] ). Dr. Renda Rolls thinks is is chronic right SI joint pain , he had CT sacroiliac joint injection right side on 06/11/21  but he states pain did not improve.  He  saw Dr. Cari Caraway - neurosurgeon and was advised to have spinal stimulator. He is now seeing Dr. Holley Raring, still waiting for approval for the spinal stimulator 7 day trial. . Cannot walk a quarter of a mile before he needs to stop due to increase in pain level. He is currently on celebrex, lyrica and tizanidine. He is waiting to have the procedure done   IMPRESSION: MRI thoracic spine  1. No acute osseous abnormality in the thoracic spine. Chronic postoperative changes posteriorly at T8-T9 likely from prior spinal cord stimulator. 2. Intermittent thoracic disc bulging and facet hypertrophy, but no significant thoracic spinal stenosis. Up to mild neural foraminal stenosis at the right T1, left T10, and bilateral T12 nerve levels. 3. Thoracic spinal cord within normal limits. 4. 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 This is a normal anatomic variant but can predispose to dysphagia lusoria. 5. 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    IMPRESSION:CT  lumbar spine wo contrast  1. Prior L4-L5 decompression and fusion with solid arthrodesis and no adverse features. 2. Adjacent segment disease at L3-L4 as well as L1-L2 and L2-L3 lumbar degeneration with spinal stenosis and foraminal stenosis appears stable since the MRI last year. 3. Aortic Atherosclerosis (ICD10-I70.0).  Atherosclerosis aorta/iliac artery stenosis/CAD: advised him to stop smoking, continue Atorvastatin , and aspirin 81 mg. He denies side effects of medication. He has CAD but denies chest pain or SOB. Last LDL was up at 134, explained goal is at least below 100 , he states he was not compliant with  medication at the time but has been taking it daily since   Plantar Fascitis: left foot, seeing Dr. Posey Pronto and had a couple steroid injections and he has rx insoles. He still has pain but not as intense.   Right elbow pain: he states about one month ago he tripped and caught himself with his arm flexed to the wall, initially no problems, but about two weeks ago he noticed intermittent shock sensation that is intense, it happens when he applies pressure to the site - like when placing his elbow on his thigh or a table.    Weight change: he had lost a lot of weight in October, he states at the time he was having problems with his partner, they are now separated and his weight is going up again. 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   Lumbar radiculopathy 11/25/2020   Lumbar spondylosis 05/03/2018   Coronary artery disease involving native heart 12/12/2017   Iliac artery stenosis, left ([place]) 12/06/2017   Atherosclerosis of aorta ([place]) 12/06/2017   Chronic bilateral low back pain with bilateral sciatica 10/03/2017   Chronic pain  syndrome 10/03/2017   Tobacco use 05/27/2017   Other intervertebral disc degeneration, lumbar region 01/30/2015    Past Surgical History:  Procedure Laterality Date   COLONOSCOPY WITH PROPOFOL N/A 06/01/2017   Procedure: COLONOSCOPY WITH PROPOFOL;  Surgeon: Lin Landsman, MD;  Location: [place];  Service: Endoscopy;  Laterality: N/A;   ESOPHAGOGASTRODUODENOSCOPY (EGD) WITH PROPOFOL  06/01/2017   Procedure: ESOPHAGOGASTRODUODENOSCOPY (EGD) WITH PROPOFOL;  Surgeon: Lin Landsman, MD;  Location: [place];  Service: Endoscopy;;   [place]   he has a rod   LEFT HEART CATH AND CORONARY ANGIOGRAPHY N/A 12/08/2017   Procedure: LEFT HEART CATH AND CORONARY ANGIOGRAPHY;  Surgeon: Corey Skains, MD;  Location: [place] [facility];  Service: Cardiovascular;  Laterality: N/A;   POLYPECTOMY  06/01/2017   Procedure: POLYPECTOMY;  Surgeon: Lin Landsman, MD;  Location: [place];  Service: Endoscopy;;   POSTERIOR LAMINECTOMY / DECOMPRESSION LUMBAR SPINE  01/30/2015   with fusion, Dr. Arnoldo Morale   SPINAL CORD STIMULATOR INSERTION  03/23/2017   Dr Newman Pies, [facility]   [place] N/A 06/16/2017   Procedure: SPINAL CORD STIMULATOR REMOVAL;  Surgeon: Newman Pies, MD;  Location: [place];  Service: Neurosurgery;  Laterality: N/A;    Family History  Problem Relation Age of Onset   Alcohol abuse Mother    Heart attack Father    Heart disease Father    Obesity Father    Heart attack Paternal Grandfather     Social History   Tobacco Use   Smoking status: Every Day    Packs/day: 1.00    Years: 35.00    Total pack years: 35.00    Types: Cigarettes    Start date: 08/02/1983   Smokeless tobacco: Never  Substance Use Topics   Alcohol use: Yes    Alcohol/week: 0.0 standard drinks of alcohol    Comment: occasionally drinks beer     Current Outpatient Medications:    atorvastatin  (LIPITOR) 40 MG tablet, Take 1 tablet (40 mg total) by mouth daily. (Patient not taking: Reported on 04/02/2022), Disp: 90 tablet, Rfl: 1   celecoxib (CELEBREX) 100 MG capsule, Take 100 mg by mouth 2 (two) times daily. (Patient not taking: Reported on 04/02/2022), Disp: , Rfl:    DULoxetine (CYMBALTA) 60 MG capsule, Take 1 capsule (60 mg total) by mouth daily. (Patient not taking: Reported on 04/02/2022), Disp: 90 capsule, Rfl: 0   pregabalin (LYRICA) 75 MG capsule, Take 1 capsule (75 mg total) by mouth 3 (three) times daily., Disp: 90 capsule, Rfl: 0   tiZANidine (ZANAFLEX) 4 MG tablet, Take 1 tablet (4 mg total) by mouth at bedtime., Disp: 90 tablet, Rfl: 0  No Known Allergies  I personally reviewed active problem list, medication list, allergies, family history, social history with the patient/caregiver today.   ROS  Constitutional: Negative for fever , positive for  weight change.  Respiratory: Negative for cough and shortness  of breath.   Cardiovascular: Negative for chest pain or palpitations.  Gastrointestinal: Negative for abdominal pain, no bowel changes.  Musculoskeletal: positive  for gait problem or joint swelling.  Skin: Negative for rash.  Neurological: Negative for dizziness or headache.  No other specific complaints in a complete review of systems (except as listed in HPI above).   Objective  Vitals:   04/22/22 0947  BP: 126/72  Pulse: 84  Resp: 16  Temp: 97.8 F (36.6 C)  TempSrc: Oral  SpO2: 97%  Weight: 204 lb 4.8 oz (92.7 kg)  Height: _0  (1.854 m)    Body mass index is 26.95 kg/m.  Physical Exam  Constitutional: Patient appears well-developed and well-nourished.  No distress.  HEENT: head atraumatic, normocephalic, pupils equal and reactive to light, neck suppl Cardiovascular: Normal rate, regular rhythm and normal heart sounds.  No murmur heard. No BLE edema. Pulmonary/Chest: Effort normal and breath sounds normal. No respiratory distress. Abdominal:  Soft.  There is no tenderness. Muscular skeletal: tender during palpation of lumbar spine, positive straight leg raise , tender to palpation of tip of right elbow  Psychiatric: Patient has a normal mood and affect. behavior is normal. Judgment and thought content normal.   Recent Results (from the past 2160 hour(s))  Hepatitis C Antibody     Status: None   Collection Time: 02/03/22 10:17 AM  Result Value Ref Range   Hepatitis C Ab NON-REACTIVE NON-REACTIVE    Comment: . HCV antibody was non-reactive. There is no laboratory  evidence of HCV infection. . In most cases, no further action is required. However, if recent HCV exposure is suspected, a test for HCV RNA (test code [id number]) is suggested. . For additional information please refer to http://education.[facility].com/faq/FAQ22v1 (This link is being provided for informational/ educational purposes only.) .   Lipid panel     Status: Abnormal   Collection Time: 02/03/22 10:17 AM  Result Value Ref Range   Cholesterol 223 (H) <200 mg/dL   HDL 72 > OR = 40 mg/dL   Triglycerides 74 <150 mg/dL   LDL Cholesterol (Calc) 134 (H) mg/dL (calc)    Comment: Reference range: <100 . Desirable range <100 mg/dL for primary prevention;   <70 mg/dL for patients with CHD or diabetic patients  with > or = 2 CHD risk factors. Marland Kitchen LDL-C is now calculated using the Martin-Hopkins  calculation, which is a validated novel method providing  better accuracy than the Friedewald equation in the  estimation of LDL-C.  Cresenciano Genre et al. Annamaria Helling. 8657;846(96): 2061-2068  (http://education.[facility].com/faq/FAQ164)    Total CHOL/HDL Ratio 3.1 <5.0 (calc)   Non-HDL Cholesterol (Calc) 151 (H) <130 mg/dL (calc)    Comment: For patients with diabetes plus 1 major ASCVD risk  factor, treating to a non-HDL-C goal of <100 mg/dL  (LDL-C of <70 mg/dL) is considered a therapeutic  option.   CBC with Differential/Platelet     Status: None   Collection  Time: 02/03/22 10:17 AM  Result Value Ref Range   WBC 8.5 3.8 - 10.8 Thousand/uL   RBC 5.26 4.20 - 5.80 Million/uL   Hemoglobin 16.6 13.2 - 17.1 g/dL   HCT 48.6 38.5 - 50.0 %   MCV 92.4 80.0 - 100.0 fL   MCH 31.6 27.0 - 33.0 pg   MCHC 34.2 32.0 - 36.0 g/dL   RDW 13.9 11.0 - 15.0 %   Platelets 240 140 - 400 Thousand/uL   MPV 11.2 7.5 - 12.5 fL   Neutro Abs  5,593 1,500 - 7,800 cells/uL   Lymphs Abs 2,125 850 - 3,900 cells/uL   Absolute Monocytes 723 200 - 950 cells/uL   Eosinophils Absolute 43 15 - 500 cells/uL   Basophils Absolute 17 0 - 200 cells/uL   Neutrophils Relative % 65.8 %   Total Lymphocyte 25.0 %   Monocytes Relative 8.5 %   Eosinophils Relative 0.5 %   Basophils Relative 0.2 %  COMPLETE METABOLIC PANEL WITH GFR     Status: None   Collection Time: 02/03/22 10:17 AM  Result Value Ref Range   Glucose, Bld 87 65 - 139 mg/dL    Comment: .        Non-fasting reference interval .    BUN 11 7 - 25 mg/dL   Creat 0.91 0.70 - 1.30 mg/dL   eGFR 99 > OR = 60 mL/min/1.4m   BUN/Creatinine Ratio SEE NOTE: 6 - 22 (calc)    Comment:    Not Reported: BUN and Creatinine are within    reference range. .    Sodium 142 135 - 146 mmol/L   Potassium 4.7 3.5 - 5.3 mmol/L   Chloride 104 98 - 110 mmol/L   CO2 30 20 - 32 mmol/L   Calcium 10.1 8.6 - 10.3 mg/dL   Total Protein 7.7 6.1 - 8.1 g/dL   Albumin 4.9 3.6 - 5.1 g/dL   Globulin 2.8 1.9 - 3.7 g/dL (calc)   AG Ratio 1.8 1.0 - 2.5 (calc)   Total Bilirubin 0.6 0.2 - 1.2 mg/dL   Alkaline phosphatase (APISO) 60 35 - 144 U/L   AST 18 10 - 35 U/L   ALT 12 9 - 46 U/L  Hemoglobin A1c     Status: None   Collection Time: 02/03/22 10:17 AM  Result Value Ref Range   Hgb A1c MFr Bld 5.5 <5.7 % of total Hgb    Comment: For the purpose of screening for the presence of diabetes: . <5.7%       Consistent with the absence of diabetes 5.7-6.4%    Consistent with increased risk for diabetes             (prediabetes) > or =6.5%  Consistent  with diabetes . This assay result is consistent with a decreased risk of diabetes. . Currently, no consensus exists regarding use of hemoglobin A1c for diagnosis of diabetes in children. . According to American Diabetes Association (ADA) guidelines, hemoglobin A1c <7.0% represents optimal control in non-pregnant diabetic patients. Different metrics may apply to specific patient populations.  Standards of Medical Care in Diabetes(ADA). .    Mean Plasma Glucose 111 mg/dL   eAG (mmol/L) 6.2 mmol/L    PHQ2/9:    04/22/2022    9:48 AM 02/03/2022   10:00 AM 01/21/2022    7:59 AM 10/21/2021    8:48 AM 08/11/2021    2:10 PM  Depression screen PHQ 2/9  Decreased Interest 0 1 2 0 0  Down, Depressed, Hopeless _0 PHQ - 2 Score _1 Altered sleeping 0 0 1 0 3  Tired, decreased energy 0 1 0 0 0  Change in appetite 0 0 0 0 0  Feeling bad or failure about yourself  0 1 1 0 0  Trouble concentrating 0 0 0 0 0  Moving slowly or fidgety/restless 0 2 0 0 0  Suicidal thoughts 0 0 0 0 0  PHQ-9 Score _2 Difficult doing  work/chores Not difficult at all Somewhat difficult       phq 9 is negative   Fall Risk:    04/22/2022    9:48 AM 02/03/2022    9:59 AM 01/21/2022    7:59 AM 10/21/2021    8:47 AM 08/11/2021    2:10 PM  Fall Risk   Falls in the past year? 1 0 0 0 1  Number falls in past yr: 1  0 0 0  Injury with Fall? 0  0 0 0  Risk for fall due to : History of fall(s) No Fall Risks No Fall Risks No Fall Risks No Fall Risks  Follow up Falls prevention discussed;Education provided;Falls evaluation completed Falls prevention discussed;Education provided;Falls evaluation completed Falls prevention discussed Falls prevention discussed Falls prevention discussed      Functional Status Survey: Is the patient deaf or have difficulty hearing?: No Does the patient have difficulty seeing, even when wearing glasses/contacts?: No Does the patient have difficulty  concentrating, remembering, or making decisions?: No Does the patient have difficulty walking or climbing stairs?: Yes Does the patient have difficulty dressing or bathing?: No Does the patient have difficulty doing errands alone such as visiting a doctor's office or shopping?: No    Assessment & Plan  1. Atherosclerosis of aorta ([place])  He states he has resumed Atorvastatin   2. Iliac artery stenosis, left ([facility])  He is now taking statin therapy daily   3. Chronic radicular lumbar pain  - tiZANidine (ZANAFLEX) 4 MG tablet; Take 1 tablet (4 mg total) by mouth at bedtime.  Dispense: 90 tablet; Refill: 0  4. Pure hypercholesterolemia  He said resume Atorvastatin   5. Dysthymia  Needs to take duloxetine   6. History of back surgery   7. Plantar fasciitis, left  Discussed stretching exercises   8. Muscle spasm  - tiZANidine (ZANAFLEX) 4 MG tablet; Take 1 tablet (4 mg total) by mouth at bedtime.  Dispense: 90 tablet; Refill: 0   9. Elbow pain, right  - Ambulatory referral to Orthopedic Surgery

## 2022-04-22 ENCOUNTER — Ambulatory Visit (INDEPENDENT_AMBULATORY_CARE_PROVIDER_SITE_OTHER): Payer: Medicaid Other | Admitting: Family Medicine

## 2022-04-22 ENCOUNTER — Encounter: Payer: Self-pay | Admitting: Family Medicine

## 2022-04-22 VITALS — BP 126/72 | HR 84 | Temp 97.8°F | Resp 16 | Ht 73.0 in | Wt 204.3 lb

## 2022-04-22 DIAGNOSIS — E7800 Pure hypercholesterolemia, unspecified: Secondary | ICD-10-CM

## 2022-04-22 DIAGNOSIS — I700 Atherosclerosis of aorta: Secondary | ICD-10-CM | POA: Diagnosis not present

## 2022-04-22 DIAGNOSIS — M722 Plantar fascial fibromatosis: Secondary | ICD-10-CM | POA: Diagnosis not present

## 2022-04-22 DIAGNOSIS — M25521 Pain in right elbow: Secondary | ICD-10-CM

## 2022-04-22 DIAGNOSIS — I771 Stricture of artery: Secondary | ICD-10-CM

## 2022-04-22 DIAGNOSIS — M62838 Other muscle spasm: Secondary | ICD-10-CM

## 2022-04-22 DIAGNOSIS — G8929 Other chronic pain: Secondary | ICD-10-CM | POA: Diagnosis not present

## 2022-04-22 DIAGNOSIS — M5416 Radiculopathy, lumbar region: Secondary | ICD-10-CM | POA: Diagnosis not present

## 2022-04-22 DIAGNOSIS — Z9889 Other specified postprocedural states: Secondary | ICD-10-CM

## 2022-04-22 DIAGNOSIS — F341 Dysthymic disorder: Secondary | ICD-10-CM | POA: Diagnosis not present

## 2022-04-22 MED ORDER — TIZANIDINE HCL 4 MG PO TABS: 4.0000 mg | ORAL_TABLET | Freq: Every day | ORAL | 0 refills | Status: DC

## 2022-04-22 NOTE — Patient Instructions (Signed)
OOFOS sandals for plantar fascitis

## 2022-04-22 NOTE — Addendum Note (Signed)
Addended by: Steele Sizer F on: 04/22/2022 10:25 AM   Modules accepted: Orders

## 2022-04-26 DIAGNOSIS — Z419 Encounter for procedure for purposes other than remedying health state, unspecified: Secondary | ICD-10-CM | POA: Diagnosis not present

## 2022-05-14 ENCOUNTER — Ambulatory Visit: Payer: Medicaid Other | Admitting: Podiatry

## 2022-05-14 VITALS — BP 128/68

## 2022-05-14 DIAGNOSIS — M722 Plantar fascial fibromatosis: Secondary | ICD-10-CM

## 2022-05-14 DIAGNOSIS — Q666 Other congenital valgus deformities of feet: Secondary | ICD-10-CM | POA: Diagnosis not present

## 2022-05-14 NOTE — Progress Notes (Signed)
Subjective:  Patient ID: Wesley Adams, male    DOB: Sep 07, 1965,  MRN: [id number]  Chief Complaint  Patient presents with   Plantar Fasciitis    Pt stated that he is doing a little better the insoles do help     57 y.o. male presents with the above complaint.  Patient presents with left Planter fasciitis.  Patient is a plantar fascia is about the same the injection did not help much at this time.  Denies any other acute complaints.  The orthotics are helping   Review of Systems: Negative except as noted in the HPI. Denies N/V/F/Ch.  Past Medical History:  Diagnosis Date   Chronic back pain    Flu    9/19   Spinal cord stimulator status 04/29/2017   [facility] Spinal Cord Stimulator placed by Dr. Newman Pies 03/23/2017.    Current Outpatient Medications:    atorvastatin (LIPITOR) 40 MG tablet, Take 1 tablet (40 mg total) by mouth daily. (Patient not taking: Reported on 04/02/2022), Disp: 90 tablet, Rfl: 1   celecoxib (CELEBREX) 100 MG capsule, Take 100 mg by mouth 2 (two) times daily. (Patient not taking: Reported on 04/02/2022), Disp: , Rfl:    DULoxetine (CYMBALTA) 60 MG capsule, Take 1 capsule (60 mg total) by mouth daily. (Patient not taking: Reported on 04/02/2022), Disp: 90 capsule, Rfl: 0   pregabalin (LYRICA) 75 MG capsule, Take 1 capsule (75 mg total) by mouth 3 (three) times daily., Disp: 90 capsule, Rfl: 0   tiZANidine (ZANAFLEX) 4 MG tablet, Take 1 tablet (4 mg total) by mouth at bedtime., Disp: 90 tablet, Rfl: 0  Social History   Tobacco Use  Smoking Status Every Day   Packs/day: 1.00   Years: 35.00   Total pack years: 35.00   Types: Cigarettes   Start date: 08/02/1983  Smokeless Tobacco Never    No Known Allergies Objective:   Vitals:   05/14/22 0810  BP: 128/68   There is no height or weight on file to calculate BMI. Constitutional Well developed. Well nourished.  Vascular Dorsalis pedis pulses palpable bilaterally. Posterior tibial pulses  palpable bilaterally. Capillary refill normal to all digits.  No cyanosis or clubbing noted. Pedal hair growth normal.  Neurologic Normal speech. Oriented to person, place, and time. Epicritic sensation to light touch grossly present bilaterally.  Dermatologic Nails well groomed and normal in appearance. No open wounds. No skin lesions.  Orthopedic: Normal joint ROM without pain or crepitus bilaterally. No visible deformities. Tender to palpation at the calcaneal tuber left. No pain with calcaneal squeeze left. Ankle ROM diminished range of motion left. Silfverskiold Test: positive left.   Radiographs: Taken and reviewed. No acute fractures or dislocations. No evidence of stress fracture.  Plantar heel spur absent. Posterior heel spur absent.  Pes planovalgus foot structure noted  Assessment:   No diagnosis found.  Plan:  Patient was evaluated and treated and all questions answered.  Plantar Fasciitis, left~recurrence - XR reviewed as above.  - Educated on icing and stretching. Instructions given.  -No further sounds that have not helped - DME: Cam boot and night splints were dispensed. - Pharmacologic management: None  Pes planovalgus -I explained to patient the etiology of pes planovalgus and relationship with Planter fasciitis and various treatment options were discussed.  Given patient foot structure in the setting of Planter fasciitis I believe patient will benefit from custom-made orthotics to help control the hindfoot motion support the arch of the foot and take the stress  away from plantar fascial.  Patient agrees with the plan like to proceed with orthotics -Orthotics were dispensed and are functioning well   No follow-ups on file.

## 2022-05-27 DIAGNOSIS — Z419 Encounter for procedure for purposes other than remedying health state, unspecified: Secondary | ICD-10-CM | POA: Diagnosis not present

## 2022-06-25 ENCOUNTER — Ambulatory Visit: Payer: Medicaid Other | Admitting: Podiatry

## 2022-06-25 DIAGNOSIS — Z419 Encounter for procedure for purposes other than remedying health state, unspecified: Secondary | ICD-10-CM | POA: Diagnosis not present

## 2022-07-08 ENCOUNTER — Ambulatory Visit: Payer: Medicaid Other | Admitting: Podiatry

## 2022-07-23 NOTE — Progress Notes (Unsigned)
Name: Wesley Adams   MRN: [id number]    DOB: Jan 29, 1966   Date:07/26/2022       Progress Note  Subjective  Chief Complaint  Follow Up  HPI  Lumbar Radiculopathy/Chronic pain syndrome: he has a long history of back pain, he had posterior laminectomy and decompression lumbar spine by Dr. Arnoldo Morale back in 01/2015, after that he got a spinal stimulator in 02/2017 however it was very bulky and bothersome so he had it removed in 05/2017. He is is seeing pain doctor - Dr. Holley Raring , he had two epidural injections with temporary relieve - a few days only. He saw  Dr. Renda Rolls at [facility]  and was referred for PT, he completed 19 sessions but continues to have pain.Marland Kitchen He has been out of work since 02/19/21 ( he was working at [employer] in [place] ). Dr. Renda Rolls thinks is is chronic right SI joint pain , he had CT sacroiliac joint injection right side on 06/11/21  but he states pain did not improve.  He  saw Dr. Cari Caraway - neurosurgeon and was advised to have spinal stimulator but insurance denied the trial therefore he does not qualify for another stimulator. He was  seeing Dr. Holley Raring but since he does not want to take narcotics daily he is still just waiting for procedures and stimulator to be approved by insurance .He states walking any distance causes increase in pain . He is currently on celebrex, lyrica and tizanidine. He states last week his pain was severe again. Today is back to baseline pain is 8/10, also having balanced problems.   IMPRESSION: MRI thoracic spine  1. No acute osseous abnormality in the thoracic spine. Chronic postoperative changes posteriorly at T8-T9 likely from prior spinal cord stimulator. 2. Intermittent thoracic disc bulging and facet hypertrophy, but no significant thoracic spinal stenosis. Up to mild neural foraminal stenosis at the right T1, left T10, and bilateral T12 nerve levels. 3. Thoracic spinal cord within normal limits. 4. 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 This is a normal anatomic variant but can predispose to dysphagia lusoria. 5. 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    IMPRESSION:CT lumbar spine wo contrast  1. Prior L4-L5 decompression and fusion with solid arthrodesis and no adverse features. 2. Adjacent segment disease at L3-L4 as well as L1-L2 and L2-L3 lumbar degeneration with spinal stenosis and foraminal stenosis appears stable since the MRI last year. 3. Aortic Atherosclerosis (ICD10-I70.0).  Atherosclerosis aorta/iliac artery stenosis/CAD: advised him to stop smoking, continue Atorvastatin , and aspirin 81 mg. He denies side effects of medication. He has CAD but denies chest pain or SOB. Last LDL was up at 134, explained goal is at least below 100 , he states he was not compliant with medication at the time but has been taking it daily since , we will recheck it next visit   Plantar Fascitis: left foot, seeing Dr. 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   Lumbar radiculopathy 11/25/2020   Lumbar spondylosis 05/03/2018   Coronary artery disease involving native heart 12/12/2017   Iliac artery stenosis, left 12/06/2017   Atherosclerosis of aorta 12/06/2017   Chronic bilateral low back pain with bilateral sciatica  10/03/2017   Chronic pain syndrome 10/03/2017   Tobacco use 05/27/2017   Other intervertebral disc degeneration, lumbar region 01/30/2015    Past Surgical History:  Procedure Laterality Date   COLONOSCOPY WITH PROPOFOL N/A 06/01/2017   Procedure: COLONOSCOPY WITH PROPOFOL;  Surgeon: Lin Landsman, MD;  Location: [place];  Service: Endoscopy;  Laterality: N/A;    ESOPHAGOGASTRODUODENOSCOPY (EGD) WITH PROPOFOL  06/01/2017   Procedure: ESOPHAGOGASTRODUODENOSCOPY (EGD) WITH PROPOFOL;  Surgeon: Lin Landsman, MD;  Location: [place];  Service: Endoscopy;;   [place]   he has a rod   LEFT HEART CATH AND CORONARY ANGIOGRAPHY N/A 12/08/2017   Procedure: LEFT HEART CATH AND CORONARY ANGIOGRAPHY;  Surgeon: Corey Skains, MD;  Location: [place] [facility];  Service: Cardiovascular;  Laterality: N/A;   POLYPECTOMY  06/01/2017   Procedure: POLYPECTOMY;  Surgeon: Lin Landsman, MD;  Location: [place];  Service: Endoscopy;;   POSTERIOR LAMINECTOMY / DECOMPRESSION LUMBAR SPINE  01/30/2015   with fusion, Dr. Arnoldo Morale   SPINAL CORD STIMULATOR INSERTION  03/23/2017   Dr Newman Pies, [facility] Neurosurgery   [place] N/A 06/16/2017   Procedure: SPINAL CORD STIMULATOR REMOVAL;  Surgeon: Newman Pies, MD;  Location: [place];  Service: Neurosurgery;  Laterality: N/A;    Family History  Problem Relation Age of Onset   Alcohol abuse Mother    Heart attack Father    Heart disease Father    Obesity Father    Heart attack Paternal Grandfather     Social History   Tobacco Use   Smoking status: Every Day    Packs/day: 1.00    Years: 35.00    Additional pack years: 0.00    Total pack years: 35.00    Types: Cigarettes    Start date: 08/02/1983   Smokeless tobacco: Never  Substance Use Topics   Alcohol use: Yes    Alcohol/week: 0.0 standard drinks of alcohol    Comment: occasionally drinks beer     Current Outpatient Medications:    atorvastatin (LIPITOR) 40 MG tablet, Take 1 tablet (40 mg total) by mouth daily., Disp: 90 tablet, Rfl: 1   celecoxib (CELEBREX) 100 MG capsule, Take 100 mg by mouth 2 (two) times daily., Disp: , Rfl:    DULoxetine (CYMBALTA) 60 MG capsule, Take 1 capsule (60 mg total) by mouth daily., Disp: 90 capsule, Rfl: 0   tiZANidine (ZANAFLEX) 4 MG  tablet, Take 1 tablet (4 mg total) by mouth at bedtime., Disp: 90 tablet, Rfl: 0   pregabalin (LYRICA) 75 MG capsule, Take 1 capsule (75 mg total) by mouth 3 (three) times daily., Disp: 90 capsule, Rfl: 0  No Known Allergies  I personally reviewed active problem list, medication list, allergies, family history, social history, health maintenance with the patient/caregiver today.   ROS  Ten systems reviewed and is negative except as mentioned in HPI   Objective  Vitals:   07/26/22 1103  BP: 134/72  Pulse: 73  Resp: 16  SpO2: 98%  Weight: 206 lb (93.4 kg)  Height: 6\' 1"  (1.854 m)    Body mass index is 27.18 kg/m.  Physical Exam  Constitutional: Patient appears well-developed and well-nourished. No distress.  HEENT: head atraumatic, normocephalic, pupils equal and reactive to light, neck supple, throat within normal limits Cardiovascular: Normal rate, regular rhythm and normal heart sounds.  No murmur heard. No BLE edema. Pulmonary/Chest: Effort normal and breath sounds normal. No respiratory distress. Abdominal:  Soft.  There is no tenderness. Muscular Skeletal: tender to palpation of lumbar spine positive straight leg raise  Psychiatric: Patient has a normal mood and affect. behavior is normal. Judgment and thought content normal.   PHQ2/9:    07/26/2022   11:02 AM 04/22/2022    9:48 AM 02/03/2022   10:00 AM 01/21/2022    7:59 AM 10/21/2021    8:48 AM  Depression screen PHQ 2/9  Decreased Interest 2 0 1 2 0  Down, Depressed, Hopeless 2 2 2 2 3   PHQ - 2 Score 4 2 3 4 3   Altered sleeping 1 0 0 1 0  Tired, decreased energy 0 0 1 0 0  Change in appetite 0 0 0 0 0  Feeling bad or failure about yourself  1 0 1 1 0  Trouble concentrating 0 0 0 0 0  Moving slowly or fidgety/restless 0 0 2 0 0  Suicidal thoughts 0 0 0 0 0  PHQ-9 Score 6 2 7 6 3   Difficult doing work/chores  Not difficult at all Somewhat difficult      phq 9 is positive   Fall Risk:    07/26/2022    11:02 AM 04/22/2022    9:48 AM 02/03/2022    9:59 AM 01/21/2022    7:59 AM 10/21/2021    8:47 AM  Fall Risk   Falls in the past year? 1 1 0 0 0  Number falls in past yr: 1 1  0 0  Injury with Fall? 1 0  0 0  Risk for fall due to : No Fall Risks History of fall(s) No Fall Risks No Fall Risks No Fall Risks  Follow up Falls prevention discussed Falls prevention discussed;Education provided;Falls evaluation completed Falls prevention discussed;Education provided;Falls evaluation completed Falls prevention discussed Falls prevention discussed      Functional Status Survey: Is the patient deaf or have difficulty hearing?: No Does the patient have difficulty seeing, even when wearing glasses/contacts?: No Does the patient have difficulty concentrating, remembering, or making decisions?: No Does the patient have difficulty walking or climbing stairs?: Yes Does the patient have difficulty dressing or bathing?: No Does the patient have difficulty doing errands alone such as visiting a doctor's office or shopping?: No    Assessment & Plan   1. Atherosclerosis of aorta  - atorvastatin (LIPITOR) 40 MG tablet; Take 1 tablet (40 mg total) by mouth daily.  Dispense: 90 tablet; Refill: 1  2. Chronic radicular lumbar pain  - DULoxetine (CYMBALTA) 60 MG capsule; Take 1 capsule (60 mg total) by mouth daily.  Dispense: 90 capsule; Refill: 1 - tiZANidine (ZANAFLEX) 4 MG tablet; Take 1 tablet (4 mg total) by mouth at bedtime.  Dispense: 90 tablet; Refill: 0 - pregabalin (LYRICA) 75 MG capsule; Take 1 capsule (75 mg total) by mouth at bedtime.  Dispense: 90 capsule; Refill: 0  Advised to resume lyrica at least at night to control the pain   3. Pure hypercholesterolemia  - atorvastatin (LIPITOR) 40 MG tablet; Take 1 tablet (40 mg total) by mouth daily.  Dispense: 90 tablet; Refill: 1  4. Dysthymia  - DULoxetine (CYMBALTA) 60 MG capsule; Take 1 capsule (60 mg total) by mouth daily.  Dispense: 90  capsule; Refill: 1  5. History of back surgery

## 2022-07-26 ENCOUNTER — Ambulatory Visit (INDEPENDENT_AMBULATORY_CARE_PROVIDER_SITE_OTHER): Payer: Medicaid Other | Admitting: Family Medicine

## 2022-07-26 ENCOUNTER — Encounter: Payer: Self-pay | Admitting: Family Medicine

## 2022-07-26 VITALS — BP 134/72 | HR 73 | Resp 16 | Ht 73.0 in | Wt 206.0 lb

## 2022-07-26 DIAGNOSIS — I700 Atherosclerosis of aorta: Secondary | ICD-10-CM | POA: Diagnosis not present

## 2022-07-26 DIAGNOSIS — Z419 Encounter for procedure for purposes other than remedying health state, unspecified: Secondary | ICD-10-CM | POA: Diagnosis not present

## 2022-07-26 DIAGNOSIS — F341 Dysthymic disorder: Secondary | ICD-10-CM

## 2022-07-26 DIAGNOSIS — M5416 Radiculopathy, lumbar region: Secondary | ICD-10-CM | POA: Diagnosis not present

## 2022-07-26 DIAGNOSIS — Z9889 Other specified postprocedural states: Secondary | ICD-10-CM

## 2022-07-26 DIAGNOSIS — G8929 Other chronic pain: Secondary | ICD-10-CM | POA: Diagnosis not present

## 2022-07-26 DIAGNOSIS — E7800 Pure hypercholesterolemia, unspecified: Secondary | ICD-10-CM | POA: Diagnosis not present

## 2022-07-26 MED ORDER — PREGABALIN 75 MG PO CAPS: 75.0000 mg | ORAL_CAPSULE | Freq: Every evening | ORAL | 0 refills | Status: DC

## 2022-07-26 MED ORDER — TIZANIDINE HCL 4 MG PO TABS: 4.0000 mg | ORAL_TABLET | Freq: Every day | ORAL | 0 refills | Status: DC

## 2022-07-26 MED ORDER — DULOXETINE HCL 60 MG PO CPEP: 60.0000 mg | ORAL_CAPSULE | Freq: Every day | ORAL | 1 refills | Status: DC

## 2022-07-26 MED ORDER — ATORVASTATIN CALCIUM 40 MG PO TABS: 40.0000 mg | ORAL_TABLET | Freq: Every day | ORAL | 1 refills | Status: DC

## 2022-08-25 DIAGNOSIS — Z419 Encounter for procedure for purposes other than remedying health state, unspecified: Secondary | ICD-10-CM | POA: Diagnosis not present

## 2022-09-25 DIAGNOSIS — Z419 Encounter for procedure for purposes other than remedying health state, unspecified: Secondary | ICD-10-CM | POA: Diagnosis not present

## 2022-10-25 DIAGNOSIS — Z419 Encounter for procedure for purposes other than remedying health state, unspecified: Secondary | ICD-10-CM | POA: Diagnosis not present

## 2022-11-01 NOTE — Progress Notes (Unsigned)
Name: Wesley Adams   MRN: [id number]    DOB: Jan 18, 1966   Date:11/02/2022       Progress Note  Subjective  Chief Complaint  Follow Up  HPI  Lumbar Radiculopathy/Chronic pain syndrome: he has a long history of back pain, he had posterior laminectomy and decompression lumbar spine by Dr. Lovell Sheehan back in 01/2015, after that he got a spinal stimulator in 02/2017 however it was very bulky and bothersome so he had it removed in 05/2017. He is is seeing pain doctor - Dr. Cherylann Ratel , he had two epidural injections with temporary relieve - a few days only. He saw  Dr. Deneen Harts at [facility]  and was referred for PT, he completed 19 sessions but continues to have pain.Marland Kitchen He has been out of work since 02/19/21 ( he was working at [employer] in [place] [employer] ). Dr. Deneen Harts thinks is is chronic right SI joint pain , he had CT sacroiliac joint injection right side on 06/11/21  but he states pain did not improve.  He  saw Dr. Marcell Barlow - neurosurgeon and was advised to have spinal stimulator but insurance denied the trial therefore he does not qualify for another stimulator. He was  seeing Dr. Cherylann Ratel but since he does not want to take narcotics daily he is still just waiting for procedures and stimulator to be approved by insurance .He states walking any distance causes increase in pain, recently he had a flare and has also noticed some lack of balance when he starts to walk after seating for a prolonged period of time  . He is currently on celebrex, lyrica  and tizanidine. . Today is back to baseline pain is 8/10  IMPRESSION: MRI thoracic spine  1. No acute osseous abnormality in the thoracic spine. Chronic postoperative changes posteriorly at T8-T9 likely from prior spinal cord stimulator. 2. Intermittent thoracic disc bulging and facet hypertrophy, but no significant thoracic spinal stenosis. Up to mild neural foraminal stenosis at the right T1, left T10, and bilateral T12 nerve levels. 3. Thoracic spinal cord within  normal limits. 4. 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 This is a normal anatomic variant but can predispose to dysphagia lusoria. 5. 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    IMPRESSION:CT lumbar spine wo contrast  1. Prior L4-L5 decompression and fusion with solid arthrodesis and no adverse features. 2. Adjacent segment disease at L3-L4 as well as L1-L2 and L2-L3 lumbar degeneration with spinal stenosis and foraminal stenosis appears stable since the MRI last year. 3. Aortic Atherosclerosis (ICD10-I70.0).  Atherosclerosis aorta/iliac artery stenosis/CAD: advised him to stop smoking, continue Atorvastatin , and aspirin 81 mg. He denies side effects of medication. He has CAD but denies chest pain or SOB. Last LDL was up at 134, explained goal is at least below 100 , he states he was not compliant with medication at the time but he has been compliant now, we will recheck labs  Plantar Fascitis: left foot, seeing Dr. Allena Katz and had a couple steroid injections, he had to wear a boot, pain is now intermittent on the ball of his foot, he states when pain returns he wears the boot at night   ED: he has a new girlfriend and difficulty having an erection, he has tried cialis in the past    Patient Active Problem List   Diagnosis Date Noted   Lumbar foraminal stenosis 05/07/2021   History of lumbar fusion (L4/5) 11/25/2020   Failed back surgical  syndrome 11/25/2020   Lumbar radiculopathy 11/25/2020   Lumbar spondylosis 05/03/2018   Coronary artery disease involving native heart 12/12/2017   Iliac artery stenosis, left (HCC) 12/06/2017   Atherosclerosis of aorta (HCC) 12/06/2017   Chronic bilateral low back pain with bilateral sciatica 10/03/2017   Chronic pain syndrome 10/03/2017   Tobacco use 05/27/2017   Other intervertebral disc degeneration, lumbar region 01/30/2015    Past Surgical History:  Procedure Laterality  Date   COLONOSCOPY WITH PROPOFOL N/A 06/01/2017   Procedure: COLONOSCOPY WITH PROPOFOL;  Surgeon: Toney Reil, MD;  Location[facility];  Service: Endoscopy;  Laterality: N/A;   ESOPHAGOGASTRODUODENOSCOPY (EGD) WITH PROPOFOL  06/01/2017   Procedure: ESOPHAGOGASTRODUODENOSCOPY (EGD) WITH PROPOFOL;  Surgeon: Toney Reil, MD;  Location: [facility];  Service: Endoscopy;;   FEMUR FRACTURE SURGERY Right 1985   he has a rod   LEFT HEART CATH AND CORONARY ANGIOGRAPHY N/A 12/08/2017   Procedure: LEFT HEART CATH AND CORONARY ANGIOGRAPHY;  Surgeon: Lamar Blinks, MD;  Location: [facility];  Service: Cardiovascular;  Laterality: N/A;   POLYPECTOMY  06/01/2017   Procedure: POLYPECTOMY;  Surgeon: Toney Reil, MD;  Location: [facility];  Service: Endoscopy;;   POSTERIOR LAMINECTOMY / DECOMPRESSION LUMBAR SPINE  01/30/2015   with fusion, Dr. Lovell Sheehan   SPINAL CORD STIMULATOR INSERTION  03/23/2017   Dr Tressie Stalker, [facility] Neurosurgery   SPINAL CORD STIMULATOR REMOVAL N/A 06/16/2017   Procedure: SPINAL CORD STIMULATOR REMOVAL;  Surgeon: Tressie Stalker, MD;  Location: [facility] OR;  Service: Neurosurgery;  Laterality: N/A;    Family History  Problem Relation Age of Onset   Alcohol abuse Mother    Heart attack Father    Heart disease Father    Obesity Father    Heart attack Paternal Grandfather     Social History   Tobacco Use   Smoking status: Every Day    Packs/day: 1.00    Years: 35.00    Additional pack years: 0.00    Total pack years: 35.00    Types: Cigarettes    Start date: 08/02/1983   Smokeless tobacco: Never  Substance Use Topics   Alcohol use: Yes    Alcohol/week: 0.0 standard drinks of alcohol    Comment: occasionally drinks beer     Current Outpatient Medications:    atorvastatin (LIPITOR) 40 MG tablet, Take 1 tablet (40 mg total) by mouth daily., Disp: 90 tablet, Rfl: 1   celecoxib (CELEBREX) 100 MG capsule,  Take 100 mg by mouth 2 (two) times daily., Disp: , Rfl:    DULoxetine (CYMBALTA) 60 MG capsule, Take 1 capsule (60 mg total) by mouth daily., Disp: 90 capsule, Rfl: 1   pregabalin (LYRICA) 75 MG capsule, Take 1 capsule (75 mg total) by mouth at bedtime., Disp: 90 capsule, Rfl: 0   tiZANidine (ZANAFLEX) 4 MG tablet, Take 1 tablet (4 mg total) by mouth at bedtime., Disp: 90 tablet, Rfl: 0  No Known Allergies  I personally reviewed active problem list, medication list, allergies, family history, social history, health maintenance with the patient/caregiver today.   ROS  Ten systems reviewed and is negative except as mentioned in HPI   Objective  Vitals:   11/02/22 0809  BP: 122/62  Pulse: 68  Resp: 14  Temp: 98 F (36.7 C)  TempSrc: Oral  SpO2: 96%  Weight: 208 lb 1.6 oz (94.4 kg)  Height: 6\' 1"  (1.854 m)    Body mass index is 27.46 kg/m.  Physical Exam  Constitutional: Patient appears well-developed and well-nourished.  No distress.  HEENT: head atraumatic, normocephalic, pupils equal and reactive to light, neck supple Cardiovascular: Normal rate, regular rhythm and normal heart sounds.  No murmur heard. No BLE edema. Pulmonary/Chest: Effort normal and breath sounds normal. No respiratory distress. Abdominal: Soft.  There is no tenderness. Muscular skeletal: decrease rom of spine, pain during palpation of lumbar spine , he has atrophy of muscles on lumbar spine, difficulty standing up straight - leans slightly forward, negative straight leg raise  Psychiatric: Patient has a normal mood and affect. behavior is normal. Judgment and thought content normal.    PHQ2/9:    11/02/2022    8:09 AM 07/26/2022   11:02 AM 04/22/2022    9:48 AM 02/03/2022   10:00 AM 01/21/2022    7:59 AM  Depression screen PHQ 2/9  Decreased Interest 2 2 0 1 2  Down, Depressed, Hopeless 1 2 2 2 2   PHQ - 2 Score 3 4 2 3 4   Altered sleeping 0 1 0 0 1  Tired, decreased energy 1 0 0 1 0  Change in  appetite 0 0 0 0 0  Feeling bad or failure about yourself  1 1 0 1 1  Trouble concentrating 0 0 0 0 0  Moving slowly or fidgety/restless 0 0 0 2 0  Suicidal thoughts 0 0 0 0 0  PHQ-9 Score 5 6 2 7 6   Difficult doing work/chores Somewhat difficult  Not difficult at all Somewhat difficult     phq 9 is positive   Fall Risk:    11/02/2022    8:08 AM 07/26/2022   11:02 AM 04/22/2022    9:48 AM 02/03/2022    9:59 AM 01/21/2022    7:59 AM  Fall Risk   Falls in the past year? 1 1 1  0 0  Number falls in past yr: 1 1 1   0  Injury with Fall? 1 1 0  0  Risk for fall due to : History of fall(s);Orthopedic patient No Fall Risks History of fall(s) No Fall Risks No Fall Risks  Follow up Education provided;Falls evaluation completed Falls prevention discussed Falls prevention discussed;Education provided;Falls evaluation completed Falls prevention discussed;Education provided;Falls evaluation completed Falls prevention discussed      Functional Status Survey: Is the patient deaf or have difficulty hearing?: No Does the patient have difficulty seeing, even when wearing glasses/contacts?: No Does the patient have difficulty concentrating, remembering, or making decisions?: No Does the patient have difficulty walking or climbing stairs?: Yes Does the patient have difficulty dressing or bathing?: No Does the patient have difficulty doing errands alone such as visiting a doctor's office or shopping?: No    Assessment & Plan  1. Atherosclerosis of aorta (HCC)  - Lipid panel  2. Chronic radicular lumbar pain  - tiZANidine (ZANAFLEX) 4 MG tablet; Take 1 tablet (4 mg total) by mouth at bedtime.  Dispense: 90 tablet; Refill: 0  PT referral   3. Iliac artery stenosis, left (HCC)  Needs LDL at least below 100  4. History of back surgery   5. Dysthymia  On Duloxetine, and has refills  6. Long-term use of high-risk medication  - COMPLETE METABOLIC PANEL WITH GFR   7. Other male erectile  dysfunction  - tadalafil (CIALIS) 20 MG tablet; Take 0.5-1 tablets (10-20 mg total) by mouth every other day as needed for erectile dysfunction.  Dispense: 30 tablet; Refill: 1

## 2022-11-02 ENCOUNTER — Encounter: Payer: Self-pay | Admitting: Family Medicine

## 2022-11-02 ENCOUNTER — Ambulatory Visit (INDEPENDENT_AMBULATORY_CARE_PROVIDER_SITE_OTHER): Payer: Medicaid Other | Admitting: Family Medicine

## 2022-11-02 VITALS — BP 122/62 | HR 68 | Temp 98.0°F | Resp 14 | Ht 73.0 in | Wt 208.1 lb

## 2022-11-02 DIAGNOSIS — F341 Dysthymic disorder: Secondary | ICD-10-CM | POA: Diagnosis not present

## 2022-11-02 DIAGNOSIS — M5416 Radiculopathy, lumbar region: Secondary | ICD-10-CM

## 2022-11-02 DIAGNOSIS — Z9889 Other specified postprocedural states: Secondary | ICD-10-CM

## 2022-11-02 DIAGNOSIS — N528 Other male erectile dysfunction: Secondary | ICD-10-CM

## 2022-11-02 DIAGNOSIS — G8929 Other chronic pain: Secondary | ICD-10-CM | POA: Diagnosis not present

## 2022-11-02 DIAGNOSIS — I700 Atherosclerosis of aorta: Secondary | ICD-10-CM

## 2022-11-02 DIAGNOSIS — I771 Stricture of artery: Secondary | ICD-10-CM | POA: Diagnosis not present

## 2022-11-02 DIAGNOSIS — Z79899 Other long term (current) drug therapy: Secondary | ICD-10-CM | POA: Diagnosis not present

## 2022-11-02 MED ORDER — TADALAFIL 20 MG PO TABS: 10.0000 mg | ORAL_TABLET | ORAL | 1 refills | Status: DC | PRN

## 2022-11-02 MED ORDER — TIZANIDINE HCL 4 MG PO TABS: 4.0000 mg | ORAL_TABLET | Freq: Every day | ORAL | 0 refills | Status: DC

## 2022-11-02 NOTE — Addendum Note (Signed)
Addended by: Alba Cory F on: 11/02/2022 08:51 AM   Modules accepted: Orders

## 2022-11-02 NOTE — Addendum Note (Signed)
Addended by: Ruel Favors on: 11/02/2022 08:48 AM   Modules accepted: Orders

## 2022-11-03 LAB — COMPLETE METABOLIC PANEL WITH GFR
AG Ratio: 1.9 (calc) (ref 1.0–2.5)
ALT: 15 U/L (ref 9–46)
AST: 18 U/L (ref 10–35)
Albumin: 4.7 g/dL (ref 3.6–5.1)
Alkaline phosphatase (APISO): 55 U/L (ref 35–144)
BUN: 12 mg/dL (ref 7–25)
CO2: 27 mmol/L (ref 20–32)
Calcium: 9.8 mg/dL (ref 8.6–10.3)
Chloride: 104 mmol/L (ref 98–110)
Creat: 0.92 mg/dL (ref 0.70–1.30)
Globulin: 2.5 g/dL (calc) (ref 1.9–3.7)
Glucose, Bld: 95 mg/dL (ref 65–99)
Potassium: 4.1 mmol/L (ref 3.5–5.3)
Sodium: 139 mmol/L (ref 135–146)
Total Bilirubin: 0.5 mg/dL (ref 0.2–1.2)
Total Protein: 7.2 g/dL (ref 6.1–8.1)
eGFR: 97 mL/min/{1.73_m2} (ref >=60)

## 2022-11-03 LAB — LIPID PANEL
Cholesterol: 234 mg/dL — ABNORMAL HIGH (ref <200)
HDL: 63 mg/dL (ref >=40)
LDL Cholesterol (Calc): 144 mg/dL (calc) — ABNORMAL HIGH
Non-HDL Cholesterol (Calc): 171 mg/dL (calc) — ABNORMAL HIGH (ref <130)
Total CHOL/HDL Ratio: 3.7 (calc) (ref <5.0)
Triglycerides: 138 mg/dL (ref <150)

## 2022-11-25 DIAGNOSIS — Z419 Encounter for procedure for purposes other than remedying health state, unspecified: Secondary | ICD-10-CM | POA: Diagnosis not present

## 2022-12-26 DIAGNOSIS — Z419 Encounter for procedure for purposes other than remedying health state, unspecified: Secondary | ICD-10-CM | POA: Diagnosis not present

## 2023-01-03 ENCOUNTER — Telehealth (INDEPENDENT_AMBULATORY_CARE_PROVIDER_SITE_OTHER): Payer: Medicaid Other | Admitting: Nurse Practitioner

## 2023-01-03 DIAGNOSIS — J069 Acute upper respiratory infection, unspecified: Secondary | ICD-10-CM | POA: Diagnosis not present

## 2023-01-03 DIAGNOSIS — F17210 Nicotine dependence, cigarettes, uncomplicated: Secondary | ICD-10-CM | POA: Diagnosis not present

## 2023-01-03 MED ORDER — CETIRIZINE HCL 10 MG PO TABS: 10.0000 mg | ORAL_TABLET | Freq: Every day | ORAL | 0 refills | Status: AC

## 2023-01-03 MED ORDER — PROMETHAZINE-DM 6.25-15 MG/5ML PO SYRP: 5.0000 mL | ORAL_SOLUTION | Freq: Four times a day (QID) | ORAL | 0 refills | Status: DC | PRN

## 2023-01-03 MED ORDER — BENZONATATE 100 MG PO CAPS: 200.0000 mg | ORAL_CAPSULE | Freq: Two times a day (BID) | ORAL | 0 refills | Status: DC | PRN

## 2023-01-03 MED ORDER — FLUTICASONE PROPIONATE 50 MCG/ACT NA SUSP: 2.0000 | Freq: Every day | NASAL | 0 refills | Status: AC

## 2023-01-03 MED ORDER — PREDNISONE 10 MG (21) PO TBPK
ORAL_TABLET | ORAL | 0 refills | Status: DC
Start: 2023-01-03 — End: 2023-02-07

## 2023-01-03 NOTE — Progress Notes (Signed)
Name: Wesley Adams   MRN: [id number]    DOB: Sep 13, 1965   Date:01/03/2023       Progress Note  Subjective  Chief Complaint  Chief Complaint  Patient presents with   Covid Exposure    Body aches, diarrhea, cough    I connected with  Risa Grill  on 01/03/23 at  1:40 PM EDT by a video enabled telemedicine application and verified that I am speaking with the correct person using two identifiers.  I discussed the limitations of evaluation and management by telemedicine and the availability of in person appointments. The patient expressed understanding and agreed to proceed with a virtual visit  Staff also discussed with the patient that there may be a patient responsible charge related to this service. Patient Location: home Provider Location: [facility] Additional Individuals present: alone  HPI  URI Compliant: symptoms started Friday, worsening symptoms started Saturday, home covid test was negative.   -Fever: no -Cough: yes -Shortness of breath: no -Wheezing: no -Chest congestion: yes -Nasal congestion: yes -Runny nose: yes -Post nasal drip: yes -Sore throat: no -Sinus pressure: yes -Headache: yes -Face pain: no -Ear pain:  no -Ear pressure: no -body aches: yes -diarrhea : yes -Relief with OTC cold/cough medications: yes, coricidin, and some advil helped a little bit   Recommend taking zyrtec, flonase, mucinex, vitamin d, vitamin c, and zinc. Push fluids and get rest.    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   Lumbar radiculopathy 11/25/2020   Lumbar spondylosis 05/03/2018   Coronary artery disease involving native heart 12/12/2017   Iliac artery stenosis, left (HCC) 12/06/2017   Atherosclerosis of aorta (HCC) 12/06/2017   Chronic bilateral low back pain with bilateral sciatica 10/03/2017   Chronic pain syndrome 10/03/2017    Tobacco use 05/27/2017   Other intervertebral disc degeneration, lumbar region 01/30/2015    Social History   Tobacco Use   Smoking status: Every Day    Current packs/day: 1.00    Average packs/day: 1 pack/day for 39.4 years (39.4 ttl pk-yrs)    Types: Cigarettes    Start date: 08/02/1983   Smokeless tobacco: Never  Substance Use Topics   Alcohol use: Yes    Alcohol/week: 0.0 standard drinks of alcohol    Comment: occasionally drinks beer     Current Outpatient Medications:    atorvastatin (LIPITOR) 40 MG tablet, Take 1 tablet (40 mg total) by mouth daily., Disp: 90 tablet, Rfl: 1   celecoxib (CELEBREX) 100 MG capsule, Take 100 mg by mouth 2 (two) times daily., Disp: , Rfl:    DULoxetine (CYMBALTA) 60 MG capsule, Take 1 capsule (60 mg total) by mouth daily., Disp: 90 capsule, Rfl: 1   tadalafil (CIALIS) 20 MG tablet, Take 0.5-1 tablets (10-20 mg total) by mouth every other day as needed for erectile dysfunction., Disp: 30 tablet, Rfl: 1   tiZANidine (ZANAFLEX) 4 MG tablet, Take 1 tablet (4 mg total) by mouth at bedtime., Disp: 90 tablet, Rfl: 0   pregabalin (LYRICA) 75 MG capsule, Take 1 capsule (75 mg total) by mouth at bedtime., Disp: 90 capsule, Rfl: 0  No Known Allergies  I personally reviewed active problem list, medication list, allergies with the patient/caregiver today.  ROS  Ten systems reviewed and is negative except as mentioned in HPI    Objective  Virtual encounter, vitals not obtained.  There is no height or weight on file to calculate BMI.  Nursing Note and Vital Signs reviewed.  Physical Exam  Awake,alert and oriented, speaking in complete sentences  No results found for this or any previous visit (from the past 72 hour(s)).  Assessment & Plan  1. Viral upper respiratory tract infection start zyrtec, flonase and mucinex, push fluids and get rest, tessalon perls and phenergan-DM and steroid taper sent in. follow up in person if no improvement.    - benzonatate (TESSALON) 100 MG capsule; Take 2 capsules (200 mg total) by mouth 2 (two) times daily as needed for cough.  Dispense: 20 capsule; Refill: 0 - promethazine-dextromethorphan (PROMETHAZINE-DM) 6.25-15 MG/5ML syrup; Take 5 mLs by mouth 4 (four) times daily as needed for cough.  Dispense: 118 mL; Refill: 0 - cetirizine (ZYRTEC) 10 MG tablet; Take 1 tablet (10 mg total) by mouth daily.  Dispense: 30 tablet; Refill: 0 - fluticasone (FLONASE) 50 MCG/ACT nasal spray; Place 2 sprays into both nostrils daily.  Dispense: 16 g; Refill: 0 - predniSONE (STERAPRED UNI-PAK 21 TAB) 10 MG (21) TBPK tablet; Take as directed on package.  (60 mg po on day 1, 50 mg po on day 2...)  Dispense: 21 tablet; Refill: 0   -Red flags and when to present for emergency care or RTC including fever >101.61F, chest pain, shortness of breath, new/worsening/un-resolving symptoms,  reviewed with patient at time of visit. Follow up and care instructions discussed and provided in AVS. - I discussed the assessment and treatment plan with the patient. The patient was provided an opportunity to ask questions and all were answered. The patient agreed with the plan and demonstrated an understanding of the instructions.  I provided 15 minutes of non-face-to-face time during this encounter.  Berniece Salines, FNP

## 2023-01-25 DIAGNOSIS — Z419 Encounter for procedure for purposes other than remedying health state, unspecified: Secondary | ICD-10-CM | POA: Diagnosis not present
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
## 2023-02-07 ENCOUNTER — Ambulatory Visit (INDEPENDENT_AMBULATORY_CARE_PROVIDER_SITE_OTHER): Payer: Medicaid Other | Admitting: Family Medicine

## 2023-02-07 ENCOUNTER — Encounter: Payer: Self-pay | Admitting: Family Medicine

## 2023-02-07 VITALS — BP 118/64 | HR 68 | Temp 97.6°F | Resp 14 | Ht 73.0 in | Wt 219.2 lb

## 2023-02-07 DIAGNOSIS — I700 Atherosclerosis of aorta: Secondary | ICD-10-CM | POA: Diagnosis not present

## 2023-02-07 DIAGNOSIS — Z23 Encounter for immunization: Secondary | ICD-10-CM

## 2023-02-07 DIAGNOSIS — Z0000 Encounter for general adult medical examination without abnormal findings: Secondary | ICD-10-CM | POA: Diagnosis not present

## 2023-02-07 DIAGNOSIS — Z131 Encounter for screening for diabetes mellitus: Secondary | ICD-10-CM | POA: Diagnosis not present

## 2023-02-07 DIAGNOSIS — R635 Abnormal weight gain: Secondary | ICD-10-CM | POA: Diagnosis not present

## 2023-02-07 DIAGNOSIS — Z79899 Other long term (current) drug therapy: Secondary | ICD-10-CM | POA: Diagnosis not present

## 2023-02-07 DIAGNOSIS — Z0001 Encounter for general adult medical examination with abnormal findings: Secondary | ICD-10-CM | POA: Diagnosis not present

## 2023-02-07 NOTE — Progress Notes (Unsigned)
Name: Wesley Adams   MRN: [id number]    DOB: [birth year]   Date:02/08/2023       Progress Note  Subjective  Chief Complaint  Follow Up  HPI  Lumbar Radiculopathy/Chronic pain syndrome: he has a long history of back pain, he had posterior laminectomy and decompression lumbar spine by Dr. Lovell Sheehan back in 01/2015, after that he got a spinal stimulator in 02/2017 however it was very bulky and bothersome so he had it removed in 05/2017. He is is seeing pain doctor - Dr. Cherylann Ratel , he had two epidural injections with temporary relieve - a few days only. He saw  Dr. Deneen Harts at [facility]  and was referred for PT, he completed 19 sessions but continues to have pain.Marland Kitchen He has been out of work since 02/19/21 ( he was working at [employer] in [employer] ). Dr. Deneen Harts thinks is is chronic right SI joint pain , he had CT sacroiliac joint injection right side on 06/11/21  but he states pain did not improve.  He  saw Dr. Marcell Barlow - neurosurgeon and was advised to have spinal stimulator but insurance denied the trial therefore he does not qualify for another stimulator. He was  seeing Dr. Cherylann Ratel but since he does not want to take narcotics daily he is still just waiting for procedures and stimulator to be approved by insurance .He states walking any distance causes increase in pain, recently he had a flare and has also noticed some lack of balance when he starts to walk after seating for a prolonged period of time  . He is out of Celebrex, stopped taking Lyrica ( did not like side effects), taking Tizanidine at night. He states today pain is worse than usual, aching all over, gaining weight and asked about going back to PT. I will also refill celebrex and continue Tizanidine at night   IMPRESSION: MRI thoracic spine  1. No acute osseous abnormality in the thoracic spine. Chronic postoperative changes posteriorly at T8-T9 likely from prior spinal cord stimulator. 2. Intermittent thoracic disc bulging and facet  hypertrophy, but no significant thoracic spinal stenosis. Up to mild neural foraminal stenosis at the right T1, left T10, and bilateral T12 nerve levels. 3. Thoracic spinal cord within normal limits. 4. 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 This is a normal anatomic variant but can predispose to dysphagia lusoria. 5. 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    IMPRESSION:CT lumbar spine wo contrast  1. Prior L4-L5 decompression and fusion with solid arthrodesis and no adverse features. 2. Adjacent segment disease at L3-L4 as well as L1-L2 and L2-L3 lumbar degeneration with spinal stenosis and foraminal stenosis appears stable since the MRI last year. 3. Aortic Atherosclerosis (ICD10-I70.0).  Atherosclerosis aorta/iliac artery stenosis/CAD: advised him to stop smoking, continue Atorvastatin , and aspirin 81 mg. He denies side effects of medication but has not been complaint with medication. Taking Atorvastatin only a few times a week.  He has CAD but denies chest pain or SOB. LDL is above goal and explained importance of getting it down below 70.   Plantar Fascitis: left foot, seeing Dr. Allena Katz and had a couple steroid injections, he is doing well now   ED: he has a new girlfriend and difficulty having an erection, the rx is at the pharmacy , he needs to go pick it up    Dysthymia : he is a single dad, taking duloxetine, the major factor on his mood is the  fact that the pain does not allow him to do things he likes to do. 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   Lumbar radiculopathy 11/25/2020   Lumbar spondylosis 05/03/2018   Coronary artery disease involving native heart  12/12/2017   Iliac artery stenosis, left (HCC) 12/06/2017   Atherosclerosis of aorta (HCC) 12/06/2017   Chronic bilateral low back pain with bilateral sciatica 10/03/2017   Chronic pain syndrome 10/03/2017   Tobacco use 05/27/2017   Other intervertebral disc degeneration, lumbar region 01/30/2015    Past Surgical History:  Procedure Laterality Date   COLONOSCOPY WITH PROPOFOL N/A 06/01/2017   Procedure: COLONOSCOPY WITH PROPOFOL;  Surgeon: Toney Reil, MD;  Location[facility];  Service: Endoscopy;  Laterality: N/A;   ESOPHAGOGASTRODUODENOSCOPY (EGD) WITH PROPOFOL  06/01/2017   Procedure: ESOPHAGOGASTRODUODENOSCOPY (EGD) WITH PROPOFOL;  Surgeon: Toney Reil, MD;  Location: [facility];  Service: Endoscopy;;   FEMUR FRACTURE SURGERY Right 1985   he has a rod   LEFT HEART CATH AND CORONARY ANGIOGRAPHY N/A 12/08/2017   Procedure: LEFT HEART CATH AND CORONARY ANGIOGRAPHY;  Surgeon: Lamar Blinks, MD;  Location: [facility] INVASIVE CV LAB;  Service: Cardiovascular;  Laterality: N/A;   POLYPECTOMY  06/01/2017   Procedure: POLYPECTOMY;  Surgeon: Toney Reil, MD;  Location: [facility];  Service: Endoscopy;;   POSTERIOR LAMINECTOMY / DECOMPRESSION LUMBAR SPINE  01/30/2015   with fusion, Dr. Lovell Sheehan   SPINAL CORD STIMULATOR INSERTION  03/23/2017   Dr Tressie Stalker, [facility]   SPINAL CORD STIMULATOR REMOVAL N/A 06/16/2017   Procedure: SPINAL CORD STIMULATOR REMOVAL;  Surgeon: Tressie Stalker, MD;  Location: [facility] OR;  Service: Neurosurgery;  Laterality: N/A;    Family History  Problem Relation Age of Onset   Alcohol abuse Mother    Heart attack Father    Heart disease Father    Obesity Father    Heart attack Paternal Grandfather     Social History   Tobacco Use   Smoking status: Every Day    Current packs/day: 1.00    Average packs/day: 1 pack/day for 39.5 years (39.5 ttl pk-yrs)    Types: Cigarettes    Start date:  08/02/1983   Smokeless tobacco: Never  Substance Use Topics   Alcohol use: Yes    Alcohol/week: 0.0 standard drinks of alcohol    Comment: occasionally drinks beer     Current Outpatient Medications:    cetirizine (ZYRTEC) 10 MG tablet, Take 1 tablet (10 mg total) by mouth daily., Disp: 30 tablet, Rfl: 0   fluticasone (FLONASE) 50 MCG/ACT nasal spray, Place 2 sprays into both nostrils daily., Disp: 16 g, Rfl: 0   tadalafil (CIALIS) 20 MG tablet, Take 0.5-1 tablets (10-20 mg total) by mouth every other day as needed for erectile dysfunction., Disp: 30 tablet, Rfl: 1   atorvastatin (LIPITOR) 40 MG tablet, Take 1 tablet (40 mg total) by mouth daily., Disp: 90 tablet, Rfl: 0   celecoxib (CELEBREX) 100 MG capsule, Take 1 capsule (100 mg total) by mouth 2 (two) times daily., Disp: 180 capsule, Rfl: 1   DULoxetine (CYMBALTA) 60 MG capsule, Take 1 capsule (60 mg total) by mouth daily., Disp: 90 capsule, Rfl: 1   tiZANidine (ZANAFLEX) 4 MG tablet, Take  1 tablet (4 mg total) by mouth at bedtime., Disp: 90 tablet, Rfl: 1  No Known Allergies  I personally reviewed active problem list, medication list, allergies, family history, social history, health maintenance with the patient/caregiver today.   ROS  Ten systems reviewed and is negative except as mentioned in HPI    Objective  Vitals:   02/08/23 1140  BP: 124/70  Pulse: 76  Resp: 14  Temp: 97.9 F (36.6 C)  TempSrc: Oral  SpO2: 99%  Weight: 219 lb (99.3 kg)  Height: 6\' 1"  (1.854 m)    Body mass index is 28.89 kg/m.  Physical Exam  Constitutional: Patient appears well-developed and well-nourished.  No distress.  HEENT: head atraumatic, normocephalic, pupils equal and reactive to light, neck supple Cardiovascular: Normal rate, regular rhythm and normal heart sounds.  No murmur heard. No BLE edema. Pulmonary/Chest: Effort normal and breath sounds normal. No respiratory distress. Abdominal: Soft.  There is no tenderness. Muscular  skeletal: pain when getting up from chair, decrease rom, pain during palpation of lumbar spine  Psychiatric: Patient has a normal mood and affect. behavior is normal. Judgment and thought content normal.   Recent Results (from the past 2160 hour(s))  Lipid panel     Status: Abnormal   Collection Time: 02/07/23 10:03 AM  Result Value Ref Range   Cholesterol 226 (H) <200 mg/dL   HDL 56 > OR = 40 mg/dL   Triglycerides 96 <782 mg/dL   LDL Cholesterol (Calc) 150 (H) mg/dL (calc)    Comment: Reference range: <100 . Desirable range <100 mg/dL for primary prevention;   <70 mg/dL for patients with CHD or diabetic patients  with > or = 2 CHD risk factors. Marland Kitchen LDL-C is now calculated using the Martin-Hopkins  calculation, which is a validated novel method providing  better accuracy than the Friedewald equation in the  estimation of LDL-C.  Horald Pollen et al. Lenox Ahr. 9562;130(86): 2061-2068  (http://education.[facility].com/faq/FAQ164)    Total CHOL/HDL Ratio 4.0 <5.0 (calc)   Non-HDL Cholesterol (Calc) 170 (H) <130 mg/dL (calc)    Comment: For patients with diabetes plus 1 major ASCVD risk  factor, treating to a non-HDL-C goal of <100 mg/dL  (LDL-C of <57 mg/dL) is considered a therapeutic  option.   CBC with Differential/Platelet     Status: None   Collection Time: 02/07/23 10:03 AM  Result Value Ref Range   WBC 7.4 3.8 - 10.8 Thousand/uL   RBC 4.94 4.20 - 5.80 Million/uL   Hemoglobin 15.0 13.2 - 17.1 g/dL   HCT 84.6 96.2 - 95.2 %   MCV 91.5 80.0 - 100.0 fL   MCH 30.4 27.0 - 33.0 pg   MCHC 33.2 32.0 - 36.0 g/dL    Comment: For adults, a slight decrease in the calculated MCHC value (in the range of 30 to 32 g/dL) is most likely not clinically significant; however, it should be interpreted with caution in correlation with other red cell parameters and the patient's clinical condition.    RDW 13.2 11.0 - 15.0 %   Platelets 196 140 - 400 Thousand/uL   MPV 11.6 7.5 - 12.5 fL    Neutro Abs 4,248 1,500 - 7,800 cells/uL   Lymphs Abs 2,338 850 - 3,900 cells/uL   Absolute Monocytes 636 200 - 950 cells/uL   Eosinophils Absolute 148 15 - 500 cells/uL   Basophils Absolute 30 0 - 200 cells/uL   Neutrophils Relative % 57.4 %   Total Lymphocyte 31.6 %   Monocytes  Relative 8.6 %   Eosinophils Relative 2.0 %   Basophils Relative 0.4 %  COMPLETE METABOLIC PANEL WITH GFR     Status: None   Collection Time: 02/07/23 10:03 AM  Result Value Ref Range   Glucose, Bld 65 65 - 99 mg/dL    Comment: .            Fasting reference interval .    BUN 12 7 - 25 mg/dL   Creat 6.30 1.60 - 1.09 mg/dL   eGFR 85 > OR = 60 NA/TFT/7.32K0   BUN/Creatinine Ratio SEE NOTE: 6 - 22 (calc)    Comment:    Not Reported: BUN and Creatinine are within    reference range. .    Sodium 138 135 - 146 mmol/L   Potassium 5.1 3.5 - 5.3 mmol/L   Chloride 103 98 - 110 mmol/L   CO2 27 20 - 32 mmol/L   Calcium 9.7 8.6 - 10.3 mg/dL   Total Protein 7.3 6.1 - 8.1 g/dL   Albumin 4.6 3.6 - 5.1 g/dL   Globulin 2.7 1.9 - 3.7 g/dL (calc)   AG Ratio 1.7 1.0 - 2.5 (calc)   Total Bilirubin 0.3 0.2 - 1.2 mg/dL   Alkaline phosphatase (APISO) 67 35 - 144 U/L   AST 18 10 - 35 U/L   ALT 21 9 - 46 U/L  TSH     Status: None   Collection Time: 02/07/23 10:03 AM  Result Value Ref Range   TSH 0.58 0.40 - 4.50 mIU/L  Hemoglobin A1c     Status: Abnormal   Collection Time: 02/07/23 10:03 AM  Result Value Ref Range   Hgb A1c MFr Bld 5.7 (H) <5.7 % of total Hgb    Comment: For someone without known diabetes, a hemoglobin  A1c value between 5.7% and 6.4% is consistent with prediabetes and should be confirmed with a  follow-up test. . For someone with known diabetes, a value <7% indicates that their diabetes is well controlled. A1c targets should be individualized based on duration of diabetes, age, comorbid conditions, and other considerations. . This assay result is consistent with an increased risk of  diabetes. . Currently, no consensus exists regarding use of hemoglobin A1c for diagnosis of diabetes for children. .    Mean Plasma Glucose 117 mg/dL   eAG (mmol/L) 6.5 mmol/L     PHQ2/9:    02/08/2023   11:39 AM 02/07/2023    9:33 AM 01/03/2023    8:41 AM 11/02/2022    8:09 AM 07/26/2022   11:02 AM  Depression screen PHQ 2/9  Decreased Interest 1 1 0 2 2  Down, Depressed, Hopeless 1 1 0 1 2  PHQ - 2 Score 2 2 0 3 4  Altered sleeping 0 0  0 1  Tired, decreased energy 1 1  1  0  Change in appetite 0 0  0 0  Feeling bad or failure about yourself  0 0  1 1  Trouble concentrating 0 0  0 0  Moving slowly or fidgety/restless 0 0  0 0  Suicidal thoughts 0 0  0 0  PHQ-9 Score 3 3  5 6   Difficult doing work/chores  Somewhat difficult  Somewhat difficult     phq 9 is positive   Fall Risk:    02/08/2023   11:39 AM 02/07/2023    9:33 AM 01/03/2023    8:41 AM 11/02/2022    8:08 AM 07/26/2022   11:02 AM  Fall  Risk   Falls in the past year? 0 0 0 1 1  Number falls in past yr: 0  0 1 1  Injury with Fall? 0  0 1 1  Risk for fall due to : No Fall Risks No Fall Risks No Fall Risks History of fall(s);Orthopedic patient No Fall Risks  Follow up Falls prevention discussed Falls prevention discussed;Education provided;Falls evaluation completed Falls prevention discussed Education provided;Falls evaluation completed Falls prevention discussed      Functional Status Survey: Is the patient deaf or have difficulty hearing?: No Does the patient have difficulty seeing, even when wearing glasses/contacts?: No Does the patient have difficulty concentrating, remembering, or making decisions?: No Does the patient have difficulty walking or climbing stairs?: Yes Does the patient have difficulty dressing or bathing?: No Does the patient have difficulty doing errands alone such as visiting a doctor's office or shopping?: No    Assessment & Plan   1. Atherosclerosis of aorta (HCC)  - atorvastatin  (LIPITOR) 40 MG tablet; Take 1 tablet (40 mg total) by mouth daily.  Dispense: 90 tablet; Refill: 0  2. Iliac artery stenosis, left (HCC)  - atorvastatin (LIPITOR) 40 MG tablet; Take 1 tablet (40 mg total) by mouth daily.  Dispense: 90 tablet; Refill: 0  3. Chronic bilateral low back pain with bilateral sciatica  - Ambulatory referral to Physical Therapy - tiZANidine (ZANAFLEX) 4 MG tablet; Take 1 tablet (4 mg total) by mouth at bedtime.  Dispense: 90 tablet; Refill: 1 - DULoxetine (CYMBALTA) 60 MG capsule; Take 1 capsule (60 mg total) by mouth daily.  Dispense: 90 capsule; Refill: 1 - celecoxib (CELEBREX) 100 MG capsule; Take 1 capsule (100 mg total) by mouth 2 (two) times daily.  Dispense: 180 capsule; Refill: 1  4. Dysthymia  - DULoxetine (CYMBALTA) 60 MG capsule; Take 1 capsule (60 mg total) by mouth daily.  Dispense: 90 capsule; Refill: 1  5. Other male erectile dysfunction  He will get Cialis   6. Pure hypercholesterolemia

## 2023-02-07 NOTE — Patient Instructions (Signed)
Please call [phone] to get your Chest CT scheduled

## 2023-02-08 ENCOUNTER — Encounter: Payer: Self-pay | Admitting: Family Medicine

## 2023-02-08 ENCOUNTER — Ambulatory Visit (INDEPENDENT_AMBULATORY_CARE_PROVIDER_SITE_OTHER): Payer: Medicaid Other | Admitting: Family Medicine

## 2023-02-08 VITALS — BP 124/70 | HR 76 | Temp 97.9°F | Resp 14 | Ht 73.0 in | Wt 219.0 lb

## 2023-02-08 DIAGNOSIS — M5441 Lumbago with sciatica, right side: Secondary | ICD-10-CM

## 2023-02-08 DIAGNOSIS — E7800 Pure hypercholesterolemia, unspecified: Secondary | ICD-10-CM | POA: Diagnosis not present

## 2023-02-08 DIAGNOSIS — I700 Atherosclerosis of aorta: Secondary | ICD-10-CM

## 2023-02-08 DIAGNOSIS — F341 Dysthymic disorder: Secondary | ICD-10-CM | POA: Diagnosis not present

## 2023-02-08 DIAGNOSIS — G8929 Other chronic pain: Secondary | ICD-10-CM | POA: Diagnosis not present

## 2023-02-08 DIAGNOSIS — I771 Stricture of artery: Secondary | ICD-10-CM | POA: Diagnosis not present

## 2023-02-08 DIAGNOSIS — N528 Other male erectile dysfunction: Secondary | ICD-10-CM | POA: Diagnosis not present

## 2023-02-08 DIAGNOSIS — M5442 Lumbago with sciatica, left side: Secondary | ICD-10-CM

## 2023-02-08 LAB — COMPLETE METABOLIC PANEL WITH GFR
AG Ratio: 1.7 (calc) (ref 1.0–2.5)
ALT: 21 U/L (ref 9–46)
AST: 18 U/L (ref 10–35)
Albumin: 4.6 g/dL (ref 3.6–5.1)
Alkaline phosphatase (APISO): 67 U/L (ref 35–144)
BUN: 12 mg/dL (ref 7–25)
CO2: 27 mmol/L (ref 20–32)
Calcium: 9.7 mg/dL (ref 8.6–10.3)
Chloride: 103 mmol/L (ref 98–110)
Creat: 1.03 mg/dL (ref 0.70–1.30)
Globulin: 2.7 g/dL (ref 1.9–3.7)
Glucose, Bld: 65 mg/dL (ref 65–99)
Potassium: 5.1 mmol/L (ref 3.5–5.3)
Sodium: 138 mmol/L (ref 135–146)
Total Bilirubin: 0.3 mg/dL (ref 0.2–1.2)
Total Protein: 7.3 g/dL (ref 6.1–8.1)
eGFR: 85 mL/min/{1.73_m2} (ref >=60)

## 2023-02-08 LAB — CBC WITH DIFFERENTIAL/PLATELET
Absolute Monocytes: 636 {cells}/uL (ref 200–950)
Basophils Absolute: 30 {cells}/uL (ref 0–200)
Basophils Relative: 0.4 %
Eosinophils Absolute: 148 {cells}/uL (ref 15–500)
Eosinophils Relative: 2 %
HCT: 45.2 % (ref 38.5–50.0)
Hemoglobin: 15 g/dL (ref 13.2–17.1)
Lymphs Abs: 2338 {cells}/uL (ref 850–3900)
MCH: 30.4 pg (ref 27.0–33.0)
MCHC: 33.2 g/dL (ref 32.0–36.0)
MCV: 91.5 fL (ref 80.0–100.0)
MPV: 11.6 fL (ref 7.5–12.5)
Monocytes Relative: 8.6 %
Neutro Abs: 4248 {cells}/uL (ref 1500–7800)
Neutrophils Relative %: 57.4 %
Platelets: 196 10*3/uL (ref 140–400)
RBC: 4.94 10*6/uL (ref 4.20–5.80)
RDW: 13.2 % (ref 11.0–15.0)
Total Lymphocyte: 31.6 %
WBC: 7.4 10*3/uL (ref 3.8–10.8)

## 2023-02-08 LAB — LIPID PANEL
Cholesterol: 226 mg/dL — ABNORMAL HIGH (ref <200)
HDL: 56 mg/dL (ref >=40)
LDL Cholesterol (Calc): 150 mg/dL — ABNORMAL HIGH
Non-HDL Cholesterol (Calc): 170 mg/dL — ABNORMAL HIGH (ref <130)
Total CHOL/HDL Ratio: 4 (calc) (ref <5.0)
Triglycerides: 96 mg/dL (ref <150)

## 2023-02-08 LAB — HEMOGLOBIN A1C
Hgb A1c MFr Bld: 5.7 %{Hb} — ABNORMAL HIGH (ref <5.7)
Mean Plasma Glucose: 117 mg/dL
eAG (mmol/L): 6.5 mmol/L

## 2023-02-08 LAB — TSH: TSH: 0.58 m[IU]/L (ref 0.40–4.50)

## 2023-02-08 MED ORDER — DULOXETINE HCL 60 MG PO CPEP: 60.0000 mg | ORAL_CAPSULE | Freq: Every day | ORAL | 1 refills | Status: DC

## 2023-02-08 MED ORDER — CELECOXIB 100 MG PO CAPS
100.0000 mg | ORAL_CAPSULE | Freq: Two times a day (BID) | ORAL | 1 refills | Status: DC
Start: 2023-02-08 — End: 2023-11-24

## 2023-02-08 MED ORDER — ATORVASTATIN CALCIUM 40 MG PO TABS
40.0000 mg | ORAL_TABLET | Freq: Every day | ORAL | 0 refills | Status: DC
Start: 2023-02-08 — End: 2023-06-13

## 2023-02-08 MED ORDER — TIZANIDINE HCL 4 MG PO TABS: 4.0000 mg | ORAL_TABLET | Freq: Every day | ORAL | 1 refills | Status: DC

## 2023-02-25 DIAGNOSIS — Z419 Encounter for procedure for purposes other than remedying health state, unspecified: Secondary | ICD-10-CM | POA: Diagnosis not present

## 2023-03-22 ENCOUNTER — Ambulatory Visit (INDEPENDENT_AMBULATORY_CARE_PROVIDER_SITE_OTHER): Payer: Medicaid Other | Admitting: Physician Assistant

## 2023-03-22 ENCOUNTER — Encounter: Payer: Self-pay | Admitting: Physician Assistant

## 2023-03-22 VITALS — BP 142/82 | HR 84 | Resp 18 | Ht 73.0 in | Wt 225.7 lb

## 2023-03-22 DIAGNOSIS — K219 Gastro-esophageal reflux disease without esophagitis: Secondary | ICD-10-CM | POA: Diagnosis not present

## 2023-03-22 DIAGNOSIS — I25119 Atherosclerotic heart disease of native coronary artery with unspecified angina pectoris: Secondary | ICD-10-CM | POA: Diagnosis not present

## 2023-03-22 DIAGNOSIS — R079 Chest pain, unspecified: Secondary | ICD-10-CM | POA: Diagnosis not present

## 2023-03-22 NOTE — Progress Notes (Signed)
Acute Office Visit   Patient: Wesley Adams   DOB: 1965-09-13   57 y.o. Male  MRN: [id number] Visit Date: 03/22/2023  Today's healthcare provider: Oswaldo Conroy Makaia Rappa, PA-C  Introduced myself to the patient as a Secondary school teacher and provided education on APPs in clinical practice.    Chief Complaint  Patient presents with   Shortness of Breath    Even while sitting down   Chest Pain    Left are along with arm discomfort and feels numb sometimes   Cough    Onset for a while   Subjective    HPI HPI     Shortness of Breath    Additional comments: Even while sitting down        Chest Pain    Additional comments: Left are along with arm discomfort and feels numb sometimes        Cough    Additional comments: Onset for a while      Last edited by Forde Radon, CMA on 03/22/2023  9:10 AM.       He reports he has been having SOB for the past few weeks He states he has been having left-sided chest pain when sitting along with numbness in his left shoulder and left arm He reports when he tries to burp  it feels like it gets stuck in the middle of his chest and causes tightness in the chest  Reports coughing will start if he reclines, gets a globus sensation in throat as well  He has gained about 15 lbs since July without changes to diet or physical activity He reports walking across a parking lot will cause SOB   He has hx of CAD - had cardiac cath in 2019  He reports father and grandfather had massive heart attacks - father passed at age 73, grandfather was 23 yo at time of death    Medications: Outpatient Medications Prior to Visit  Medication Sig   atorvastatin (LIPITOR) 40 MG tablet Take 1 tablet (40 mg total) by mouth daily.   celecoxib (CELEBREX) 100 MG capsule Take 1 capsule (100 mg total) by mouth 2 (two) times daily.   cetirizine (ZYRTEC) 10 MG tablet Take 1 tablet (10 mg total) by mouth daily.   DULoxetine (CYMBALTA) 60 MG capsule Take 1 capsule  (60 mg total) by mouth daily.   fluticasone (FLONASE) 50 MCG/ACT nasal spray Place 2 sprays into both nostrils daily.   tadalafil (CIALIS) 20 MG tablet Take 0.5-1 tablets (10-20 mg total) by mouth every other day as needed for erectile dysfunction.   tiZANidine (ZANAFLEX) 4 MG tablet Take 1 tablet (4 mg total) by mouth at bedtime.   No facility-administered medications prior to visit.    Review of Systems  Respiratory:  Positive for shortness of breath. Negative for chest tightness.   Cardiovascular:  Positive for chest pain. Negative for palpitations and leg swelling.  Neurological:  Positive for numbness (left shoulder and arm).        Objective    BP (!) 142/82   Pulse 84   Resp 18   Ht 6\' 1"  (1.854 m)   Wt 225 lb 11.2 oz (102.4 kg)   SpO2 98%   BMI 29.78 kg/m     Physical Exam Vitals reviewed.  Constitutional:      General: He is awake.     Appearance: Normal appearance. He is well-developed and well-groomed.  HENT:     Head:  Normocephalic and atraumatic.  Cardiovascular:     Rate and Rhythm: Normal rate and regular rhythm.     Pulses: Normal pulses.          Radial pulses are 2+ on the right side and 2+ on the left side.     Heart sounds: Normal heart sounds. No murmur heard.    No friction rub. No gallop.  Pulmonary:     Effort: Pulmonary effort is normal.     Breath sounds: Normal breath sounds. No decreased air movement. No decreased breath sounds, wheezing, rhonchi or rales.  Musculoskeletal:     Right lower leg: No edema.     Left lower leg: No edema.  Skin:    General: Skin is warm and dry.  Neurological:     General: No focal deficit present.     Mental Status: He is alert and oriented to person, place, and time.  Psychiatric:        Mood and Affect: Mood normal.        Behavior: Behavior normal. Behavior is cooperative.        Thought Content: Thought content normal.        Judgment: Judgment normal.     EKG performed today in office EKG  demonstrates normal sinus rhythm, rate of ~65 bpm Today's EKG was compared to previous EKG from 12/09/2017 and there are no significant changes    No results found for any visits on 03/22/23.  Assessment & Plan      No follow-ups on file.        Problem List Items Addressed This Visit       Cardiovascular and Mediastinum   Coronary artery disease involving native heart   Relevant Orders   Ambulatory referral to Cardiology   Other Visit Diagnoses     Chest pain, unspecified type    -  Primary   Relevant Orders   EKG 12-Lead      Acute, ongoing Patient reports ongoing shortness of breath on exertion along with intermittent left-sided chest pain for the past few weeks.  He reports that chest pain does not correlate with exertion as he usually gets it when sitting.  Review of previous charts shows that he has had persistent weight gain over the last several months. Lack of lower extremity edema, wheezes or crackles in lungs, heart murmur makes me less suspicious of CHF, pulmonary etiology.  Suspect that shortness of breath is likely secondary to weight gain and deconditioning. Will also place referral to cardiology for further rule out given his previous history of CAD.  Reviewed EKG results with him during appointment.  After discussing symptoms with him I am more suspicious that his chest pain is likely secondary to GERD. Will start OTC Pepcid and recommend using Pepto or Tums as needed for flares.  Reviewed ED and return precautions Recommend follow-up after cardiology appointment for further coordination of care or sooner if concerns arise  No follow-ups on file.   I, Tyrell Brereton E Bryla Burek, PA-C, have reviewed all documentation for this visit. The documentation on 03/22/23 for the exam, diagnosis, procedures, and orders are all accurate and complete.   Jacquelin Hawking, MHS, PA-C [facility]

## 2023-03-22 NOTE — Patient Instructions (Addendum)
I recommend the following for now:  Please start a medication called Pepcid to help prevent heartburn. You can use Pepto or TUMS to help with flares  I have placed a referral to Cardiology for further evaluation to check your heart - please be on the lookout for the call from the scheduling department  If you start to have ongoing chest pain, numbness or pain in your left arm, chest heaviness or pressure, shortness of breath, sweating, feeling like something bad will happen- please go the the ED immediately

## 2023-03-23 ENCOUNTER — Other Ambulatory Visit: Payer: Self-pay | Admitting: Physician Assistant

## 2023-03-23 ENCOUNTER — Telehealth: Payer: Self-pay

## 2023-03-23 DIAGNOSIS — K219 Gastro-esophageal reflux disease without esophagitis: Secondary | ICD-10-CM

## 2023-03-23 MED ORDER — OMEPRAZOLE 20 MG PO CPDR
20.0000 mg | DELAYED_RELEASE_CAPSULE | Freq: Every day | ORAL | 1 refills | Status: DC
Start: 2023-03-23 — End: 2023-06-13

## 2023-03-23 NOTE — Telephone Encounter (Signed)
Pt would like to get omeprazole prescribed for his acid reflux, pt seen yesterday

## 2023-03-23 NOTE — Addendum Note (Signed)
Addended by: Jacquelin Hawking on: 03/23/2023 12:29 PM   Modules accepted: Orders, Level of Service

## 2023-03-23 NOTE — Telephone Encounter (Signed)
Requested medication (s) are due for refill today: see request  Requested medication (s) are on the active medication list: yes   Last refill:  03/23/23 #30 1 refills  Future visit scheduled: yes in 2 months  Notes to clinic:  patient requesting 90 day supply.  Do you want to refill for #90 0 refills?     Requested Prescriptions  Pending Prescriptions Disp Refills   omeprazole (PRILOSEC) 20 MG capsule [Pharmacy Med Name: OMEPRAZOLE 20MG  CAPSULES] 90 capsule     Sig: TAKE 1 CAPSULE(20 MG) BY MOUTH DAILY     Gastroenterology: Proton Pump Inhibitors Passed - 03/23/2023 12:39 PM      Passed - Valid encounter within last 12 months    Recent Outpatient Visits           Yesterday Chest pain, unspecified type   [facility] Mecum, Oswaldo Conroy, PA-C   1 month ago Atherosclerosis of aorta [facility])   [facility] Alba Cory, MD   1 month ago Well adult exam   [facility] Alba Cory, MD   2 months ago Viral upper respiratory tract infection   [facility] Berniece Salines, FNP   4 months ago Atherosclerosis of aorta [facility])   [facility] Alba Cory, MD       Future Appointments             In 2 months Carlynn Purl, Danna Hefty, MD [facility]   In 10 months Alba Cory, MD [facility]

## 2023-03-27 DIAGNOSIS — Z419 Encounter for procedure for purposes other than remedying health state, unspecified: Secondary | ICD-10-CM | POA: Diagnosis not present

## 2023-03-31 ENCOUNTER — Ambulatory Visit: Payer: Medicaid Other | Attending: Family Medicine

## 2023-03-31 DIAGNOSIS — M5442 Lumbago with sciatica, left side: Secondary | ICD-10-CM | POA: Insufficient documentation

## 2023-03-31 DIAGNOSIS — M5441 Lumbago with sciatica, right side: Secondary | ICD-10-CM | POA: Insufficient documentation

## 2023-03-31 DIAGNOSIS — G8929 Other chronic pain: Secondary | ICD-10-CM | POA: Diagnosis not present

## 2023-03-31 DIAGNOSIS — M6281 Muscle weakness (generalized): Secondary | ICD-10-CM | POA: Diagnosis not present

## 2023-03-31 NOTE — Therapy (Addendum)
OUTPATIENT PHYSICAL THERAPY THORACOLUMBAR EVALUATION   Patient Name: Wesley Adams MRN: [id number] DOB:Sep 17, 1965, 57 y.o., male Today's Date: 03/31/2023  END OF SESSION:  PT End of Session - 03/31/23 0948     Visit Number 1    Number of Visits 17    Date for PT Re-Evaluation 05/26/23    PT Start Time 0946    PT Stop Time 1030    PT Time Calculation (min) 44 min    Equipment Utilized During Treatment Gait belt             Past Medical History:  Diagnosis Date   Chronic back pain    Flu    9/19   Spinal cord stimulator status 04/29/2017   [facility] Spinal Cord Stimulator placed by Dr. Tressie Stalker 03/23/2017.   Past Surgical History:  Procedure Laterality Date   COLONOSCOPY WITH PROPOFOL N/A 06/01/2017   Procedure: COLONOSCOPY WITH PROPOFOL;  Surgeon: Toney Reil, MD;  Location: [facility];  Service: Endoscopy;  Laterality: N/A;   ESOPHAGOGASTRODUODENOSCOPY (EGD) WITH PROPOFOL  06/01/2017   Procedure: ESOPHAGOGASTRODUODENOSCOPY (EGD) WITH PROPOFOL;  Surgeon: Toney Reil, MD;  Location: [facility];  Service: Endoscopy;;   FEMUR FRACTURE SURGERY Right 1985   he has a rod   LEFT HEART CATH AND CORONARY ANGIOGRAPHY N/A 12/08/2017   Procedure: LEFT HEART CATH AND CORONARY ANGIOGRAPHY;  Surgeon: Lamar Blinks, MD;  Location: [facility];  Service: Cardiovascular;  Laterality: N/A;   POLYPECTOMY  06/01/2017   Procedure: POLYPECTOMY;  Surgeon: Toney Reil, MD;  Location: [facility];  Service: Endoscopy;;   POSTERIOR LAMINECTOMY / DECOMPRESSION LUMBAR SPINE  01/30/2015   with fusion, Dr. Lovell Sheehan   SPINAL CORD STIMULATOR INSERTION  03/23/2017   Dr Tressie Stalker, [facility]   SPINAL CORD STIMULATOR REMOVAL N/A 06/16/2017   Procedure: SPINAL CORD STIMULATOR REMOVAL;  Surgeon: Tressie Stalker, MD;  Location: [facility] OR;  Service: Neurosurgery;  Laterality: N/A;   Patient Active Problem List    Diagnosis Date Noted   Lumbar foraminal stenosis 05/07/2021   History of lumbar fusion (L4/5) 11/25/2020   Failed back surgical syndrome 11/25/2020   Lumbar radiculopathy 11/25/2020   Lumbar spondylosis 05/03/2018   Coronary artery disease involving native heart 12/12/2017   Iliac artery stenosis, left (HCC) 12/06/2017   Atherosclerosis of aorta (HCC) 12/06/2017   Chronic bilateral low back pain with bilateral sciatica 10/03/2017   Chronic pain syndrome 10/03/2017   Tobacco use 05/27/2017   Other intervertebral disc degeneration, lumbar region 01/30/2015    PCP: Alba Cory, MD  REFERRING PROVIDER: Alba Cory, MD  REFERRING DIAG: M54.42,M54.41,G89.29 (ICD-10-CM) - Chronic bilateral low back pain with bilateral sciatica   Rationale for Evaluation and Treatment: Rehabilitation  THERAPY DIAG:  Chronic right-sided low back pain with right-sided sciatica  ONSET DATE: September 25, 2022  SUBJECTIVE:  SUBJECTIVE STATEMENT:  Pt is 57 y.o. male seeking therapy for chronic low back pain.  PERTINENT HISTORY:  Pt reports he is familiar with the clinic and notes that he is having increased low back pain , which he was seen for in the past.  Pt notes that he has gained a significant amount of weight since June 2024 (24#) and he attributes it to his poor diet and lack of exercise.  Pt stating that he just needs to "restart" with therapy in order to get back to being active and where he needs to be.  Pt has family history of heart complications.  Pt notes that back pain to be at 10/10 and feels as though being less active has contributed to his body complications.  PAIN: Are you having pain? Yes: NPRS scale: 10/10 Pain location: right sided low back pain Pain description: burning, sharp, shooting depending on  which way he moves Aggravating factors: bending over Relieving factors: stretching  by grabbing onto the doorway Worst Pain over past 7 days: 10/10 Best Pain over past 7 days: 7/10  PRECAUTIONS: None  RED FLAGS: None   WEIGHT BEARING RESTRICTIONS: No  FALLS: Has patient fallen in last 6 months? Yes. Number of falls 2-3  LIVING ENVIRONMENT: Lives with: lives with their family Lives in: House/apartment Stairs: Yes: External: 3 steps; none Has following equipment at home: None  OCCUPATION: Disability  PLOF: Independent  PATIENT GOALS: Pt wants to be stronger and make better life choices in regards to his health.  NEXT MD VISIT: Unsure  OBJECTIVE:  Note: Objective measures were completed at Evaluation unless otherwise noted.  DIAGNOSTIC FINDINGS:   EXAM: MRI THORACIC SPINE WITHOUT CONTRAST  IMPRESSION: 1. No acute osseous abnormality in the thoracic spine. Chronic postoperative changes posteriorly at T8-T9 likely from prior spinal cord stimulator. 2. Intermittent thoracic disc bulging and facet hypertrophy, but no significant thoracic spinal stenosis. Up to mild neural foraminal stenosis at the right T1, left T10, and bilateral T12 nerve levels. 3. Thoracic spinal cord within normal limits. 4. 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 This is a normal anatomic variant but can predispose to dysphagia lusoria. 5. 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   PATIENT SURVEYS:  FOTO 36/53  SCREENING FOR RED FLAGS: Bowel or bladder incontinence: No Spinal tumors: No Cauda equina syndrome: No Compression fracture: No Abdominal aneurysm: No  COGNITION: Overall cognitive status: Within functional limits for tasks assessed  SENSATION: WFL  POSTURE: rounded shoulders and forward head  PALPATION: Pt with tenderness in the R gluteal region that brings about symptoms that are consistent with sciatic  nerve pain.  LUMBAR ROM: To be assessed at 2nd visit  AROM eval  Flexion   Extension   Right lateral flexion   Left lateral flexion   Right rotation   Left rotation    (Blank rows = not tested)  LOWER EXTREMITY ROM:   To be assessed at 2nd visit  Active  Right eval Left eval  Hip flexion    Hip extension    Hip abduction    Hip adduction    Hip internal rotation    Hip external rotation    Knee flexion    Knee extension    Ankle dorsiflexion    Ankle plantarflexion    Ankle inversion    Ankle eversion     (Blank rows = not tested)  LOWER EXTREMITY MMT:    MMT Right eval Left eval  Hip flexion 3+ 3+  Hip extension 3+ 3+  Hip abduction 3+ 3+  Hip adduction 3+ 3+  Hip internal rotation    Hip external rotation    Knee flexion 3+ 4-  Knee extension 3+ 4-  Ankle dorsiflexion    Ankle plantarflexion    Ankle inversion    Ankle eversion     (Blank rows = not tested)  LUMBAR SPECIAL TESTS:  Straight leg raise test: Positive, Slump test: Positive, and FABER test: Positive  FUNCTIONAL TESTS:  5 times sit to stand: To be assessed at 2nd visit Timed up and go (TUG): To be assessed at 2nd visit    TODAY'S TREATMENT: DATE: 03/31/23  Eval only    PATIENT EDUCATION: Education details: Pt educated on role of PT and services provided during current POC, along with prognosis and information about the clinic.  Person educated: Patient Education method: Explanation Education comprehension: verbalized understanding  HOME EXERCISE PROGRAM: To be given at next visit.  ASSESSMENT:  CLINICAL IMPRESSION: Patient is a 57 y.o. male who was seen today for physical therapy evaluation and treatment for low back with radicular symptoms on the R LE and generalized weakness and endurance levels.  Pt presents with physical impairments of decreased activity tolerance, decreased ROM of lumbar spine/R hip, increased pain in low back and R LE, and decreased strength in R LE as  noted.  Pt has signs and symptoms that are consistent with sciatica.  Pt will benefit from skilled therapy to address tolerance, ROM, pain, and strength impairments necessary for improvement in quality of life.  Pt. demonstrates understanding of this plan of care and agrees with this plan.    OBJECTIVE IMPAIRMENTS: decreased activity tolerance, decreased balance, decreased endurance, decreased mobility, decreased strength, and pain.   ACTIVITY LIMITATIONS: lifting, bending, sitting, standing, squatting, and locomotion level  PARTICIPATION LIMITATIONS: meal prep, cleaning, laundry, shopping, community activity, occupation, and yard work  PERSONAL FACTORS: Age, Fitness, Past/current experiences, Social background, Time since onset of injury/illness/exacerbation, and 3+ comorbidities: chronic pain syndrome, lumbar stenosis, lumbar spondylosis, tobacco use, CAD  are also affecting patient's functional outcome.   REHAB POTENTIAL: Fair pt already participated in therapy once previously and was not consistent with HEP or performance of his exercises outside of the clinic.  CLINICAL DECISION MAKING: Stable/uncomplicated  EVALUATION COMPLEXITY: Low   GOALS: Goals reviewed with patient? Yes  SHORT TERM GOALS: Target date: 04/28/2023  Pt will be independent with HEP in order to demonstrate increased ability to perform tasks related to occupation/hobbies. Baseline: To be given at 2nd visit Goal status: INITIAL  LONG TERM GOALS: Target date: 06/23/2023   Patient will demonstrate improved function as evidenced by a score of 53 on FOTO measure for full participation in activities at home and in the community. Baseline: 36 Goal status: INITIAL  2.   Pt will increase leg press 1RM by 20% to demonstrate improved LE strengthening necessary for return to gym-based activities.   Baseline:  To be assessed at 2nd visit Goal status: INITIAL  3.  Pt will reduce overall pain level to 3/10 by utilizing a  combination of stretching, strengthening exercises, and pain-reducing modalities in order to improve overall QoL. Baseline: Pt reporting 10/10 pain Goal status: INITIAL  4.  Pt will improve the Oswestry Disability Questionnaire to be </=14 to demonstrate increased functional ability to perform tasks of daily living. Baseline:  Goal status: INITIAL  5.  Pt will increase walking distance to 1 mile (5280 feet) within  12 weeks by incorporating interval training and breathing exercises, with progress monitored during each session. Baseline:  Pt unable to ambulate >200 ft without becoming winded Goal status: INITIAL   PLAN:  PT FREQUENCY: 2x/week  PT DURATION: 12 weeks  PLANNED INTERVENTIONS: 97110-Therapeutic exercises, 97530- Therapeutic activity, [id number]- Neuromuscular re-education, 97535- Self Care, 29528- Manual therapy, [phone]- Gait training, [phone]- Ultrasound, Balance training, Stair training, Dry Needling, Joint mobilization, Joint manipulation, Spinal mobilization, Cryotherapy, and Moist heat.  PLAN FOR NEXT SESSION: continue with goal assessment (lumbar and hip ROM measurements, TUG, 5xSTS, 1 rep max on leg press), establish HEP   Nolon Bussing, PT, DPT Physical Therapist - [facility]  [facility]  03/31/23, 4:31 PM

## 2023-04-04 ENCOUNTER — Telehealth: Payer: Self-pay

## 2023-04-04 ENCOUNTER — Ambulatory Visit: Payer: Medicaid Other

## 2023-04-04 NOTE — Telephone Encounter (Signed)
Pt no-showed to appointment today.  Therapist called and left voicemail reminding pt of next appointment on Wednesday, 04/06/23 at 9:45 AM.   Nolon Bussing, PT, DPT Physical Therapist - [facility]  04/04/23, 11:15 AM

## 2023-04-06 ENCOUNTER — Ambulatory Visit: Payer: Medicaid Other

## 2023-04-06 NOTE — Addendum Note (Signed)
Addended by: Phineas Real on: 04/06/2023 05:24 PM   Modules accepted: Orders

## 2023-04-07 ENCOUNTER — Ambulatory Visit
Admission: RE | Admit: 2023-04-07 | Discharge: 2023-04-07 | Disposition: A | Discharge status: Home / Self Care | Payer: Medicaid Other | Source: Ambulatory Visit | Attending: Family Medicine | Admitting: Family Medicine

## 2023-04-07 DIAGNOSIS — J439 Emphysema, unspecified: Secondary | ICD-10-CM | POA: Insufficient documentation

## 2023-04-07 DIAGNOSIS — Z87891 Personal history of nicotine dependence: Secondary | ICD-10-CM | POA: Diagnosis present

## 2023-04-07 DIAGNOSIS — Z122 Encounter for screening for malignant neoplasm of respiratory organs: Secondary | ICD-10-CM | POA: Diagnosis not present

## 2023-04-07 DIAGNOSIS — F17210 Nicotine dependence, cigarettes, uncomplicated: Secondary | ICD-10-CM | POA: Diagnosis not present

## 2023-04-07 DIAGNOSIS — I700 Atherosclerosis of aorta: Secondary | ICD-10-CM | POA: Insufficient documentation

## 2023-04-12 ENCOUNTER — Ambulatory Visit: Payer: Medicaid Other

## 2023-04-14 ENCOUNTER — Ambulatory Visit: Payer: Medicaid Other

## 2023-04-14 ENCOUNTER — Telehealth: Payer: Self-pay

## 2023-04-14 NOTE — Telephone Encounter (Signed)
No show. Called patient who said that he forgot the appointment again. States that he is in the process of moving. Would like to cancel the rest of his appointments. Will schedule more when able.

## 2023-04-21 ENCOUNTER — Other Ambulatory Visit: Payer: Self-pay | Admitting: Acute Care

## 2023-04-21 DIAGNOSIS — Z87891 Personal history of nicotine dependence: Secondary | ICD-10-CM

## 2023-04-21 DIAGNOSIS — Z122 Encounter for screening for malignant neoplasm of respiratory organs: Secondary | ICD-10-CM

## 2023-04-26 ENCOUNTER — Ambulatory Visit: Payer: Medicaid Other

## 2023-04-27 DIAGNOSIS — Z419 Encounter for procedure for purposes other than remedying health state, unspecified: Secondary | ICD-10-CM | POA: Diagnosis not present

## 2023-05-06 ENCOUNTER — Encounter: Payer: Self-pay | Admitting: Family Medicine

## 2023-05-11 ENCOUNTER — Ambulatory Visit: Payer: Medicaid Other | Attending: Cardiology | Admitting: Cardiology

## 2023-05-11 ENCOUNTER — Encounter: Payer: Self-pay | Admitting: Cardiology

## 2023-05-11 VITALS — BP 160/70 | HR 75 | Ht 73.0 in | Wt 235.0 lb

## 2023-05-11 DIAGNOSIS — M5416 Radiculopathy, lumbar region: Secondary | ICD-10-CM | POA: Diagnosis not present

## 2023-05-11 DIAGNOSIS — Z720 Tobacco use: Secondary | ICD-10-CM | POA: Diagnosis not present

## 2023-05-11 DIAGNOSIS — R079 Chest pain, unspecified: Secondary | ICD-10-CM | POA: Diagnosis not present

## 2023-05-11 DIAGNOSIS — I700 Atherosclerosis of aorta: Secondary | ICD-10-CM

## 2023-05-11 DIAGNOSIS — E785 Hyperlipidemia, unspecified: Secondary | ICD-10-CM | POA: Diagnosis not present

## 2023-05-11 DIAGNOSIS — I25119 Atherosclerotic heart disease of native coronary artery with unspecified angina pectoris: Secondary | ICD-10-CM

## 2023-05-11 DIAGNOSIS — R072 Precordial pain: Secondary | ICD-10-CM | POA: Diagnosis not present

## 2023-05-11 LAB — TROPONIN T: Troponin T (Highly Sensitive): 16 ng/L (ref 0–22)

## 2023-05-11 MED ORDER — NITROGLYCERIN 0.4 MG SL SUBL: 0.4000 mg | SUBLINGUAL_TABLET | SUBLINGUAL | 6 refills | Status: AC | PRN

## 2023-05-11 MED ORDER — METOPROLOL TARTRATE 25 MG PO TABS: 25.0000 mg | ORAL_TABLET | Freq: Two times a day (BID) | ORAL | 3 refills | Status: AC

## 2023-05-11 MED ORDER — ASPIRIN 81 MG PO TBEC
81.0000 mg | DELAYED_RELEASE_TABLET | Freq: Every day | ORAL | Status: AC
Start: 2023-05-11 — End: ?

## 2023-05-11 NOTE — Patient Instructions (Signed)
 Medication Instructions:   START: Aspirin  81mg  1 tablet daily  START: Metoprolol  Tartrate 25mg  1 tablet twice daily  START: Nitroglycerin  Use nitroglycerin  1 tablet placed under the tongue at the first sign of chest pain or an angina attack. 1 tablet may be used every 5 minutes as needed, for up to 15 minutes. Do not take more than 3 tablets in 15 minutes. If pain persist call 911 or go to the nearest ED.   TAKE: On day of CT Scan - Take 4 tablets of Metoprolol  Tartrate 2 hours prior to CT Scan    Lab Work: Troponin I- STAT If you have labs (blood work) drawn today and your tests are completely normal, you will receive your results only by: MyChart Message (if you have MyChart) OR A paper copy in the mail If you have any lab test that is abnormal or we need to change your treatment, we will call you to review the results.   Testing/Procedures:  Your cardiac CT will be scheduled at one of the below locations:    [facility] [address] [place], [place] [address] [phone]    If scheduled at [facility], please arrive 15 mins early for check-in and test prep.  Please follow these instructions carefully (unless otherwise directed):  Hold all erectile dysfunction medications at least 3 days (72 hrs) prior to test.  On the Night Before the Test: Be sure to Drink plenty of water . Do not consume any caffeinated/decaffeinated beverages or chocolate 12 hours prior to your test. Do not take any antihistamines 12 hours prior to your test.   On the Day of the Test: Drink plenty of water  until 1 hour prior to the test. Do not eat any food 4 hours prior to the test. You may take your regular medications prior to the test.  Take metoprolol  (Lopressor ) two hours prior to test.         After the Test: Drink plenty of water . After receiving IV contrast, you may experience a mild flushed feeling. This is  normal. On occasion, you may experience a mild rash up to 24 hours after the test. This is not dangerous. If this occurs, you can take Benadryl 25 mg and increase your fluid intake. If you experience trouble breathing, this can be serious. If it is severe call 911 IMMEDIATELY. If it is mild, please call our office. If you take any of these medications: Glipizide/Metformin, Avandament, Glucavance, please do not take 48 hours after completing test unless otherwise instructed.  We will call to schedule your test 2-4 weeks out understanding that some insurance companies will need an authorization prior to the service being performed.   For non-scheduling related questions, please contact the cardiac imaging nurse navigator should you have any questions/concerns: Jinger Mount, Cardiac Imaging Nurse Navigator Chase Copping, Cardiac Imaging Nurse Navigator [facility] Direct Office Dial: [phone]   For scheduling needs, including cancellations and rescheduling, please call [place], [phone].    Follow-Up: At [facility], you and your health needs are our priority.  As part of our continuing mission to provide you with exceptional heart care, we have created designated Provider Care Teams.  These Care Teams include your primary Cardiologist (physician) and Advanced Practice Providers (APPs -  Physician Assistants and Nurse Practitioners) who all work together to provide you with the care you need, when you need it.  We recommend signing up for the patient portal  called "MyChart".  Sign up information is provided on this After Visit Summary.  MyChart is used to connect with patients for Virtual Visits (Telemedicine).  Patients are able to view lab/test results, encounter notes, upcoming appointments, etc.  Non-urgent messages can be sent to your provider as well.   To learn more about what you can do with MyChart, go to [email or web address].    Your next  appointment:   1 month(s)  The format for your next appointment:   In Person  Provider:   Ralene Burger, MD    Other Instructions NA

## 2023-05-11 NOTE — Addendum Note (Signed)
 Addended by: Shawnee Dellen D on: 05/11/2023 11:36 AM   Modules accepted: Orders

## 2023-05-11 NOTE — Progress Notes (Signed)
 Cardiology Consultation:    Date:  05/11/2023   ID:  Wesley Adams, DOB [birth year], MRN [id number]  PCP:  Sowles, Krichna, MD  Cardiologist:  Ralene Burger, MD   Referring MD: Sowles, Krichna, MD   Chief Complaint  Patient presents with   Chest Pain   Abnormal CT    History of Present Illness:    Wesley Adams is a 58 y.o. male who is being seen today for the evaluation of chest pain at the request of Arleen Lacer, MD. past medical history significant for chronic back pain, dyslipidemia not taking cholesterol medication, smoking which is ongoing, in 2019 he got cardiac catheterization done showed narrowing of circumflex artery 20 to 30%.  He is here in the office to talk about his symptoms about a month ago he woke up in the morning with heaviness in the chest.  He did have a little shortness of breath associated with this and tingling in both arms.  It lasted probably for about an hour or so however it is somewhat difficult to get good story from him he called his primary care physician and then this appointment has been made.  Since that time he complained of having some chest tightness that typically happens in the morning when he wakes up.  Walking climbing stress does not create a problem but his ability to exercise limited because of chronic pain in the back he does have back stimulator.  He said he had last episode of chest pain yesterday.  He smoke he does have cholesterol medication but does not take it he does have family history of coronary artery disease but not premature  Past Medical History:  Diagnosis Date   Chronic back pain    Flu    9/19   Spinal cord stimulator status 04/29/2017   [facility] Scientific Spinal Cord Stimulator placed by Dr. Garry Kansas 03/23/2017.    Past Surgical History:  Procedure Laterality Date   COLONOSCOPY WITH PROPOFOL  N/A 06/01/2017   Procedure: COLONOSCOPY WITH PROPOFOL ;  Surgeon: Selena Daily, MD;  Location: [facility]  SURGERY CNTR;  Service: Endoscopy;  Laterality: N/A;   ESOPHAGOGASTRODUODENOSCOPY (EGD) WITH PROPOFOL   06/01/2017   Procedure: ESOPHAGOGASTRODUODENOSCOPY (EGD) WITH PROPOFOL ;  Surgeon: Selena Daily, MD;  Location: [facility];  Service: Endoscopy;;   FEMUR FRACTURE SURGERY Right 1985   he has a rod   LEFT HEART CATH AND CORONARY ANGIOGRAPHY N/A 12/08/2017   Procedure: LEFT HEART CATH AND CORONARY ANGIOGRAPHY;  Surgeon: Michelle Aid, MD;  Location: [facility] INVASIVE CV LAB;  Service: Cardiovascular;  Laterality: N/A;   POLYPECTOMY  06/01/2017   Procedure: POLYPECTOMY;  Surgeon: Selena Daily, MD;  Location: [facility];  Service: Endoscopy;;   POSTERIOR LAMINECTOMY / DECOMPRESSION LUMBAR SPINE  01/30/2015   with fusion, Dr. Larrie Po   SPINAL CORD STIMULATOR INSERTION  03/23/2017   Dr Garry Kansas, [facility]   SPINAL CORD STIMULATOR REMOVAL N/A 06/16/2017   Procedure: SPINAL CORD STIMULATOR REMOVAL;  Surgeon: Garry Kansas, MD;  Location: [facility] OR;  Service: Neurosurgery;  Laterality: N/A;    Current Medications: Current Meds  Medication Sig   atorvastatin  (LIPITOR) 40 MG tablet Take 1 tablet (40 mg total) by mouth daily.   celecoxib  (CELEBREX ) 100 MG capsule Take 1 capsule (100 mg total) by mouth 2 (two) times daily.   cetirizine  (ZYRTEC ) 10 MG tablet Take 1 tablet (10 mg total) by mouth daily.   fluticasone  (FLONASE ) 50 MCG/ACT nasal spray Place 2  sprays into both nostrils daily.   omeprazole  (PRILOSEC) 20 MG capsule Take 1 capsule (20 mg total) by mouth daily.   tadalafil  (CIALIS ) 20 MG tablet Take 0.5-1 tablets (10-20 mg total) by mouth every other day as needed for erectile dysfunction.   tiZANidine  (ZANAFLEX ) 4 MG tablet Take 1 tablet (4 mg total) by mouth at bedtime.   [DISCONTINUED] DULoxetine  (CYMBALTA ) 60 MG capsule Take 1 capsule (60 mg total) by mouth daily.     Allergies:   Patient has no known allergies.   Social History    Socioeconomic History   Marital status: Divorced    Spouse name: Not on file   Number of children: Not on file   Years of education: Not on file   Highest education level: Some college, no degree  Occupational History   Not on file  Tobacco Use   Smoking status: Every Day    Current packs/day: 1.00    Average packs/day: 1 pack/day for 39.8 years (39.8 ttl pk-yrs)    Types: Cigarettes    Start date: 08/02/1983   Smokeless tobacco: Never  Vaping Use   Vaping status: Never Used  Substance and Sexual Activity   Alcohol use: Yes    Alcohol/week: 0.0 standard drinks of alcohol    Comment: occasionally drinks beer   Drug use: No   Sexual activity: Yes    Partners: Female  Other Topics Concern   Not on file  Social History Narrative   Not on file   Social Drivers of Health   Financial Resource Strain: Low Risk  (02/03/2022)   Overall Financial Resource Strain (CARDIA)    Difficulty of Paying Living Expenses: Not hard at all  Food Insecurity: No Food Insecurity (02/03/2022)   Hunger Vital Sign    Worried About Running Out of Food in the Last Year: Never true    Ran Out of Food in the Last Year: Never true  Transportation Needs: No Transportation Needs (02/03/2022)   PRAPARE - Administrator, Civil Service (Medical): No    Lack of Transportation (Non-Medical): No  Physical Activity: Inactive (02/03/2022)   Exercise Vital Sign    Days of Exercise per Week: 0 days    Minutes of Exercise per Session: 0 min  Stress: Stress Concern Present (02/03/2022)   [employer] of Occupational Health - Occupational Stress Questionnaire    Feeling of Stress : Rather much  Social Connections: Moderately Isolated (02/03/2022)   Social Connection and Isolation Panel [NHANES]    Frequency of Communication with Friends and Family: More than three times a week    Frequency of Social Gatherings with Friends and Family: More than three times a week    Attends Religious Services:  Never    Database administrator or Organizations: No    Attends Engineer, structural: Never    Marital Status: Living with partner     Family History: The patient's family history includes Alcohol abuse in his mother; Heart attack in his father and paternal grandfather; Heart disease in his father; Obesity in his father. ROS:   Please see the history of present illness.    All 14 point review of systems negative except as described per history of present illness.  EKGs/Labs/Other Studies Reviewed:    The following studies were reviewed today:   EKG:  EKG Interpretation Date/Time:  Wednesday May 11 2023 10:23:54 EST Ventricular Rate:  74 PR Interval:  150 QRS Duration:  88 QT Interval:  388 QTC Calculation: 430 R Axis:   -35  Text Interpretation: Normal sinus rhythm Left axis deviation When compared with ECG of 06-Dec-2017 16:35, PREVIOUS ECG IS PRESENT Confirmed by Ralene Burger [phone]) on 05/11/2023 10:31:30 AM    Recent Labs: 02/07/2023: ALT 21; BUN 12; Creat 1.03; Hemoglobin 15.0; Platelets 196; Potassium 5.1; Sodium 138; TSH 0.58  Recent Lipid Panel    Component Value Date/Time   CHOL 226 (H) 02/07/2023 1003   TRIG 96 02/07/2023 1003   HDL 56 02/07/2023 1003   CHOLHDL 4.0 02/07/2023 1003   LDLCALC 150 (H) 02/07/2023 1003    Physical Exam:    VS:  BP (!) 160/70 (BP Location: Right Arm, Patient Position: Sitting)   Pulse 75   Ht 6\' 1"  (1.854 m)   Wt 235 lb (106.6 kg)   SpO2 95%   BMI 31.00 kg/m     Wt Readings from Last 3 Encounters:  05/11/23 235 lb (106.6 kg)  03/22/23 225 lb 11.2 oz (102.4 kg)  02/08/23 219 lb (99.3 kg)     GEN:  Well nourished, well developed in no acute distress HEENT: Normal NECK: No JVD; No carotid bruits LYMPHATICS: No lymphadenopathy CARDIAC: RRR, no murmurs, no rubs, no gallops RESPIRATORY:  Clear to auscultation without rales, wheezing or rhonchi  ABDOMEN: Soft, non-tender, non-distended MUSCULOSKELETAL:  No  edema; No deformity  SKIN: Warm and dry NEUROLOGIC:  Alert and oriented x 3 PSYCHIATRIC:  Normal affect   ASSESSMENT:    1. Coronary artery disease involving native coronary artery of native heart with angina pectoris (HCC)   2. Atherosclerosis of aorta (HCC)   3. Lumbar radiculopathy   4. Tobacco use   5. Dyslipidemia    PLAN:    In order of problems listed above:  Chest pain very suspicious or worrisome I will do stat troponin I today, if troponin is positive we admit him to the hospital proceed to cardiac catheterization if negative will start him on aspirin  we will give him today for aspirins 81 mg and then he will take 81 mg daily, I will put him on metoprolol  titrate 25 mg twice daily, give him prescription for nitroglycerin , I gave him instruction to go to the emergency if nitroglycerin  does not relieve the pain.  Will schedule him to have coronary CT angio.  It is difficult distinguish his pain in the chest from potential heart issue versus chronic back pain that he have however something clearly changed about a month ago that is why this investigation need to be done. Atherosclerosis of the aorta noted on the last CAT scan.  Contributing factor. Tobacco abuse we did have a long discussion about this and I strongly recommend to quit. Dyslipidemia I did review K PN which show me his LDL of 150 HDL 52 this is in October but he time he did not take his cholesterol medication at that time.  Will put him on Lipitor 40 daily he does have already medication at home   Medication Adjustments/Labs and Tests Ordered: Current medicines are reviewed at length with the patient today.  Concerns regarding medicines are outlined above.  Orders Placed This Encounter  Procedures   EKG 12-Lead   No orders of the defined types were placed in this encounter.   Signed, Manfred Seed, MD, [facility]. 05/11/2023 10:49 AM    [facility]

## 2023-05-25 ENCOUNTER — Telehealth (HOSPITAL_COMMUNITY): Payer: Self-pay | Admitting: *Deleted

## 2023-05-25 NOTE — Telephone Encounter (Signed)
Attempted to call patient regarding upcoming cardiac CT appointment. Left message on voicemail with name and callback number  Larey Brick RN Navigator Cardiac Imaging [facility] [phone] Office [phone] Cell

## 2023-05-26 ENCOUNTER — Ambulatory Visit
Admission: RE | Admit: 2023-05-26 | Discharge: 2023-05-26 | Disposition: A | Discharge status: Home / Self Care | Payer: Medicaid Other | Source: Ambulatory Visit | Attending: Cardiology | Admitting: Cardiology

## 2023-05-26 DIAGNOSIS — R072 Precordial pain: Secondary | ICD-10-CM | POA: Insufficient documentation

## 2023-05-26 MED ORDER — NITROGLYCERIN 0.4 MG SL SUBL
0.8000 mg | SUBLINGUAL_TABLET | Freq: Once | SUBLINGUAL | Status: AC
  Administered 2023-05-26: 0.8 mg via SUBLINGUAL

## 2023-05-26 MED ORDER — IOHEXOL 350 MG/ML SOLN
100.0000 mL | Freq: Once | INTRAVENOUS | Status: AC | PRN
  Administered 2023-05-26: 100 mL via INTRAVENOUS

## 2023-05-26 MED ORDER — SODIUM CHLORIDE 0.9 % IV SOLN: INTRAVENOUS | Status: DC

## 2023-05-26 NOTE — Progress Notes (Signed)
Patient tolerated procedure well. Ambulate w/o difficulty. Denies light headedness or being dizzy. Sitting up drinking water provided. Encouraged to drink extra water today and reasoning explained. Verbalized understanding. All questions answered. ABC intact. No further needs. Discharge from procedure area w/o issues.

## 2023-05-28 DIAGNOSIS — Z419 Encounter for procedure for purposes other than remedying health state, unspecified: Secondary | ICD-10-CM | POA: Diagnosis not present

## 2023-06-01 ENCOUNTER — Telehealth: Payer: Self-pay

## 2023-06-01 NOTE — Telephone Encounter (Signed)
Left message on My Chart with CT results per Dr. Vanetta Shawl note. Routed to PCP.

## 2023-06-02 ENCOUNTER — Telehealth: Payer: Self-pay

## 2023-06-02 NOTE — Telephone Encounter (Signed)
 Pt viewed CT results on My Chart per Dr. Vanetta Shawl note. Routed to PCP.

## 2023-06-06 ENCOUNTER — Encounter: Payer: Self-pay | Admitting: Cardiology

## 2023-06-07 ENCOUNTER — Ambulatory Visit: Payer: Medicaid Other | Attending: Cardiology | Admitting: Cardiology

## 2023-06-13 ENCOUNTER — Other Ambulatory Visit: Payer: Self-pay | Admitting: Family Medicine

## 2023-06-13 ENCOUNTER — Encounter: Payer: Self-pay | Admitting: Family Medicine

## 2023-06-13 ENCOUNTER — Ambulatory Visit: Payer: Medicaid Other | Admitting: Family Medicine

## 2023-06-13 VITALS — BP 146/84 | HR 78 | Resp 16 | Ht 73.0 in | Wt 230.4 lb

## 2023-06-13 DIAGNOSIS — I700 Atherosclerosis of aorta: Secondary | ICD-10-CM | POA: Diagnosis not present

## 2023-06-13 DIAGNOSIS — I10 Essential (primary) hypertension: Secondary | ICD-10-CM

## 2023-06-13 DIAGNOSIS — M5442 Lumbago with sciatica, left side: Secondary | ICD-10-CM

## 2023-06-13 DIAGNOSIS — R0683 Snoring: Secondary | ICD-10-CM | POA: Diagnosis not present

## 2023-06-13 DIAGNOSIS — I25119 Atherosclerotic heart disease of native coronary artery with unspecified angina pectoris: Secondary | ICD-10-CM | POA: Diagnosis not present

## 2023-06-13 DIAGNOSIS — G8929 Other chronic pain: Secondary | ICD-10-CM

## 2023-06-13 DIAGNOSIS — Z981 Arthrodesis status: Secondary | ICD-10-CM

## 2023-06-13 DIAGNOSIS — K219 Gastro-esophageal reflux disease without esophagitis: Secondary | ICD-10-CM | POA: Diagnosis not present

## 2023-06-13 DIAGNOSIS — I771 Stricture of artery: Secondary | ICD-10-CM | POA: Diagnosis not present

## 2023-06-13 DIAGNOSIS — M5441 Lumbago with sciatica, right side: Secondary | ICD-10-CM | POA: Diagnosis not present

## 2023-06-13 DIAGNOSIS — J410 Simple chronic bronchitis: Secondary | ICD-10-CM | POA: Diagnosis not present

## 2023-06-13 MED ORDER — FLUTICASONE-SALMETEROL 100-50 MCG/ACT IN AEPB: 1.0000 | INHALATION_SPRAY | Freq: Two times a day (BID) | RESPIRATORY_TRACT | 2 refills | Status: DC

## 2023-06-13 MED ORDER — OMEPRAZOLE 40 MG PO CPDR: 40.0000 mg | DELAYED_RELEASE_CAPSULE | Freq: Every day | ORAL | 0 refills | Status: DC

## 2023-06-13 MED ORDER — ATORVASTATIN CALCIUM 40 MG PO TABS: 40.0000 mg | ORAL_TABLET | Freq: Every day | ORAL | 0 refills | Status: DC

## 2023-06-13 MED ORDER — LOSARTAN POTASSIUM 50 MG PO TABS
50.0000 mg | ORAL_TABLET | Freq: Every day | ORAL | 0 refills | Status: DC
Start: 2023-06-13 — End: 2023-07-21

## 2023-06-13 NOTE — Progress Notes (Signed)
Name: Wesley Adams   MRN: [id number]    DOB: 1966/02/07   Date:06/13/2023       Progress Note  Subjective  Chief Complaint  Chief Complaint  Patient presents with   Medical Management of Chronic Issues   HPI   Chest pain: seen by cardiologist, Dr. Bing Matter and was advised to resume statin therapy at 40 mg of Atorvastatin. Explained Cardiologist tried to contact him with results . His BP is elevated even though taking metoprolol, we will add losartan. He states no chest pain since last visit with cardiologist but gets SOB with activity   Positive cardiac calcium score:   IMPRESSION: 1. Coronary calcium score of 526. This was 95th percentile for age and sex matched control.   2. Normal coronary origin with right dominance.   3. Mild proximal LAD and RCA (25-49%).   4. Minimal proximal LCx stenosis (<25%).   5. CAD-RADS 2. Mild non-obstructive CAD (25-49%). Consider non-atherosclerotic causes of chest pain. Consider preventive therapy and risk factor modification.   Lumbar Radiculopathy/Chronic pain syndrome: he has a long history of back pain, he had posterior laminectomy and decompression lumbar spine by Dr. Lovell Sheehan back in 01/2015, after that he got a spinal stimulator in 02/2017 however it was very bulky and bothersome so he had it removed in 05/2017. He is is seeing pain doctor - Dr. Cherylann Ratel , he had two epidural injections with temporary relieve - a few days only. He saw  Dr. Deneen Harts at [facility]  and was referred for PT, he completed 19 sessions but continues to have pain.Marland Kitchen He has been out of work since 02/19/21 ( he was working at [employer] in [place] ). Dr. Deneen Harts thinks is is chronic right SI joint pain , he had CT sacroiliac joint injection right side on 06/11/21  but he states pain did not improve.  He  saw Dr. Marcell Barlow - neurosurgeon and was advised to have spinal stimulator but insurance denied the trial therefore he does not qualify for another stimulator. He was   seeing Dr. Cherylann Ratel but since he does not want to take narcotics daily he is still just waiting for procedures and stimulator to be approved by insurance .He states walking any distance causes increase in pain, recently he had a flare and has also noticed some lack of balance when he starts to walk after seating for a prolonged period of time  . He is taking celebrex  stopped taking Lyrica ( did not like side effects), he is also taking Tizanidine prn at night. Pain is constant and bothersome    IMPRESSION: MRI thoracic spine  1. No acute osseous abnormality in the thoracic spine. Chronic postoperative changes posteriorly at T8-T9 likely from prior spinal cord stimulator. 2. Intermittent thoracic disc bulging and facet hypertrophy, but no significant thoracic spinal stenosis. Up to mild neural foraminal stenosis at the right T1, left T10, and bilateral T12 nerve levels. 3. Thoracic spinal cord within normal limits. 4. 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 This is a normal anatomic variant but can predispose to dysphagia lusoria. 5. 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     IMPRESSION:CT lumbar spine wo contrast  1. Prior L4-L5 decompression and fusion with solid arthrodesis and no adverse features. 2. Adjacent segment disease at L3-L4 as well as L1-L2 and L2-L3 lumbar degeneration with spinal stenosis and foraminal stenosis appears stable since the MRI last year. 3. Aortic Atherosclerosis (ICD10-I70.0).   Atherosclerosis aorta/iliac  artery stenosis/CAD: advised him to stop smoking, continue Atorvastatin , and aspirin 81 mg. He denies side effects of medication but has not been complaint with medication. Since January after he saw cardiologist he has been compliant with statin therapy   Dysthymia : he is a single dad, taking duloxetine, the major factor on his mood is the fact that the pain does not allow him to do things he  likes to do.   GERD: he has some heartburn, not taking PPI, we will try higher dose omeprazole  Smoker's cough: noticing that is worse over the past month. He continues to smoke and hangs out with smokers. 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   Lumbar radiculopathy 11/25/2020   Lumbar spondylosis 05/03/2018   Coronary artery disease involving native heart 12/12/2017   Iliac artery stenosis, left (HCC) 12/06/2017   Atherosclerosis of aorta (HCC) 12/06/2017   Chronic bilateral low back pain with bilateral sciatica 10/03/2017   Chronic pain syndrome 10/03/2017   Tobacco use 05/27/2017   Other intervertebral disc degeneration, lumbar region 01/30/2015    Past Surgical History:  Procedure Laterality Date   COLONOSCOPY WITH PROPOFOL N/A 06/01/2017   Procedure: COLONOSCOPY WITH PROPOFOL;  Surgeon: Toney Reil, MD;  Location: [facility];  Service: Endoscopy;  Laterality: N/A;   ESOPHAGOGASTRODUODENOSCOPY (EGD) WITH PROPOFOL  06/01/2017   Procedure: ESOPHAGOGASTRODUODENOSCOPY (EGD) WITH PROPOFOL;  Surgeon: Toney Reil, MD;  Location: [facility] SURGERY [facility]TR;  Service: Endoscopy;;   FEMUR FRACTURE SURGERY Right 1985   he has a rod   LEFT HEART CATH AND CORONARY ANGIOGRAPHY N/A 12/08/2017   Procedure: LEFT HEART CATH AND CORONARY ANGIOGRAPHY;  Surgeon: Lamar Blinks, MD;  Location: [facility] INVASIVE CV LAB;  Service: Cardiovascular;  Laterality: N/A;   POLYPECTOMY  06/01/2017   Procedure: POLYPECTOMY;  Surgeon: Toney Reil, MD;  Location: [facility];  Service: Endoscopy;;   POSTERIOR LAMINECTOMY / DECOMPRESSION LUMBAR SPINE  01/30/2015   with fusion, Dr.  Lovell Sheehan   SPINAL CORD STIMULATOR INSERTION  03/23/2017   Dr Tressie Stalker, [facility]   SPINAL CORD STIMULATOR REMOVAL N/A 06/16/2017   Procedure: SPINAL CORD STIMULATOR REMOVAL;  Surgeon: Tressie Stalker, MD;  Location: [facility] OR;  Service: Neurosurgery;  Laterality: N/A;    Family History  Problem Relation Age of Onset   Alcohol abuse Mother    Heart attack Father    Heart disease Father    Obesity Father    Heart attack Paternal Grandfather     Social History   Tobacco Use   Smoking status: Every Day    Current packs/day: 1.00    Average packs/day: 1 pack/day for 39.9 years (39.9 ttl pk-yrs)    Types: Cigarettes    Start date: 08/02/1983   Smokeless tobacco: Never  Substance Use Topics   Alcohol use: Yes    Alcohol/week: 0.0 standard drinks of alcohol    Comment: occasionally drinks beer     Current Outpatient Medications:    aspirin EC 81 MG tablet, Take 1 tablet (81 mg total) by mouth daily. Swallow whole., Disp: , Rfl:    atorvastatin (LIPITOR) 40 MG tablet, Take 1 tablet (40  mg total) by mouth daily., Disp: 90 tablet, Rfl: 0   celecoxib (CELEBREX) 100 MG capsule, Take 1 capsule (100 mg total) by mouth 2 (two) times daily., Disp: 180 capsule, Rfl: 1   cetirizine (ZYRTEC) 10 MG tablet, Take 1 tablet (10 mg total) by mouth daily., Disp: 30 tablet, Rfl: 0   fluticasone (FLONASE) 50 MCG/ACT nasal spray, Place 2 sprays into both nostrils daily., Disp: 16 g, Rfl: 0   metoprolol tartrate (LOPRESSOR) 25 MG tablet, Take 1 tablet (25 mg total) by mouth 2 (two) times daily., Disp: 180 tablet, Rfl: 3   nitroGLYCERIN (NITROSTAT) 0.4 MG SL tablet, Place 1 tablet (0.4 mg total) under the tongue every 5 (five) minutes as needed for chest pain., Disp: 25 tablet, Rfl: 6   omeprazole (PRILOSEC) 20 MG capsule, Take 1 capsule (20 mg total) by mouth daily., Disp: 30 capsule, Rfl: 1   tadalafil (CIALIS) 20 MG tablet, Take 0.5-1 tablets (10-20 mg total) by mouth every other day as  needed for erectile dysfunction., Disp: 30 tablet, Rfl: 1   tiZANidine (ZANAFLEX) 4 MG tablet, Take 1 tablet (4 mg total) by mouth at bedtime., Disp: 90 tablet, Rfl: 1  No Known Allergies  I personally reviewed active problem list, medication list, allergies, family history with the patient/caregiver today.   ROS  Ten systems reviewed and is negative except as mentioned in HPI    Objective  Vitals:   06/13/23 0927  BP: (!) 154/86  Pulse: 78  Resp: 16  SpO2: 97%  Weight: 230 lb 6.4 oz (104.5 kg)  Height: 6\' 1"  (1.854 m)    Body mass index is 30.4 kg/m.  Physical Exam  Constitutional: Patient appears well-developed and well-nourished. Obese  No distress.  HEENT: head atraumatic, normocephalic, pupils equal and reactive to light, neck supple Cardiovascular: Normal rate, regular rhythm and normal heart sounds.  No murmur heard. No BLE edema. Pulmonary/Chest: Effort normal and breath sounds normal. No respiratory distress. Abdominal: Soft.  There is no tenderness. Muscular skeletal: pain during palpation of lumbar spine, Psychiatric: Patient has a normal mood and affect. behavior is normal. Judgment and thought content normal.   Recent Results (from the past 2160 hours)  Troponin T, STAT ([facility])     Status: None   Collection Time: 05/11/23 11:19 AM  Result Value Ref Range   Troponin T (Highly Sensitive) 16 0 - 22 ng/L    Comment: In order to distinguish acute elevations of high sensitive Troponin from other clinical conditions, the Universal Definition of myocardial infarction stresses clinical assessment and the need for serial measurements to observe a rise and/or fall above the upper limit of the reference interval.     Diabetic Foot Exam:     PHQ2/9:    06/13/2023    9:24 AM 03/22/2023    8:56 AM 02/08/2023   11:39 AM 02/07/2023    9:33 AM 01/03/2023    8:41 AM  Depression screen PHQ 2/9  Decreased Interest 0 0 1 1 0  Down, Depressed, Hopeless 0 0 1 1 0   PHQ - 2 Score 0 0 2 2 0  Altered sleeping 0  0 0   Tired, decreased energy 0  1 1   Change in appetite 0  0 0   Feeling bad or failure about yourself  0  0 0   Trouble concentrating 0  0 0   Moving slowly or fidgety/restless 0  0 0   Suicidal thoughts 0  0 0  PHQ-9 Score 0  3 3   Difficult doing work/chores Not difficult at all   Somewhat difficult     phq 9 is negative  Fall Risk:    06/13/2023    9:24 AM 03/22/2023    8:56 AM 02/08/2023   11:39 AM 02/07/2023    9:33 AM 01/03/2023    8:41 AM  Fall Risk   Falls in the past year? 0 0 0 0 0  Number falls in past yr: 0 0 0  0  Injury with Fall? 0 0 0  0  Risk for fall due to : No Fall Risks No Fall Risks No Fall Risks No Fall Risks No Fall Risks  Follow up Falls prevention discussed;Education provided;Falls evaluation completed Falls prevention discussed;Education provided;Falls evaluation completed Falls prevention discussed Falls prevention discussed;Education provided;Falls evaluation completed Falls prevention discussed     Assessment & Plan  1. Coronary artery disease involving native coronary artery of native heart with angina pectoris (HCC) (Primary)  - Amb Referral to Cardiac Rehabilitation  2. Atherosclerosis of aorta (HCC)  - atorvastatin (LIPITOR) 40 MG tablet; Take 1 tablet (40 mg total) by mouth daily.  Dispense: 90 tablet; Refill: 0  3. Iliac artery stenosis, left (HCC)  - atorvastatin (LIPITOR) 40 MG tablet; Take 1 tablet (40 mg total) by mouth daily.  Dispense: 90 tablet; Refill: 0  4. Smokers' cough (HCC)  - fluticasone-salmeterol (ADVAIR) 100-50 MCG/ACT AEPB; Inhale 1 puff into the lungs 2 (two) times daily.  Dispense: 1 each; Refill: 2  5. Gastroesophageal reflux disease, unspecified whether esophagitis present  - omeprazole (PRILOSEC) 40 MG capsule; Take 1 capsule (40 mg total) by mouth daily.  Dispense: 90 capsule; Refill: 0  6. Chronic bilateral low back pain with bilateral sciatica  Getting  worse   7. History of lumbar fusion   8. Uncontrolled hypertension  - losartan (COZAAR) 50 MG tablet; Take 1 tablet (50 mg total) by mouth daily.  Dispense: 30 tablet; Refill: 0  9. Snoring  - Ambulatory referral to Sleep Studies

## 2023-06-25 DIAGNOSIS — Z419 Encounter for procedure for purposes other than remedying health state, unspecified: Secondary | ICD-10-CM | POA: Diagnosis not present

## 2023-06-27 ENCOUNTER — Ambulatory Visit: Payer: Medicaid Other

## 2023-07-20 ENCOUNTER — Ambulatory Visit: Attending: Otolaryngology

## 2023-07-20 DIAGNOSIS — I493 Ventricular premature depolarization: Secondary | ICD-10-CM | POA: Insufficient documentation

## 2023-07-20 DIAGNOSIS — G4733 Obstructive sleep apnea (adult) (pediatric): Secondary | ICD-10-CM | POA: Diagnosis not present

## 2023-07-20 DIAGNOSIS — G4761 Periodic limb movement disorder: Secondary | ICD-10-CM | POA: Diagnosis not present

## 2023-07-20 DIAGNOSIS — R0683 Snoring: Secondary | ICD-10-CM | POA: Diagnosis present

## 2023-07-21 ENCOUNTER — Ambulatory Visit (INDEPENDENT_AMBULATORY_CARE_PROVIDER_SITE_OTHER): Payer: Medicaid Other | Admitting: Family Medicine

## 2023-07-21 ENCOUNTER — Encounter: Payer: Self-pay | Admitting: Family Medicine

## 2023-07-21 VITALS — BP 128/76 | HR 63 | Resp 16 | Ht 73.0 in | Wt 227.1 lb

## 2023-07-21 DIAGNOSIS — I10 Essential (primary) hypertension: Secondary | ICD-10-CM | POA: Diagnosis not present

## 2023-07-21 DIAGNOSIS — N528 Other male erectile dysfunction: Secondary | ICD-10-CM

## 2023-07-21 MED ORDER — LOSARTAN POTASSIUM 50 MG PO TABS: 50.0000 mg | ORAL_TABLET | Freq: Every day | ORAL | 1 refills | Status: DC

## 2023-07-21 NOTE — Progress Notes (Signed)
 Name: Wesley Adams   MRN: [id number]    DOB: 1965-08-02   Date:07/21/2023       Progress Note  Subjective  Chief Complaint  Chief Complaint  Patient presents with   Medical Management of Chronic Issues    HTN- pt has been taking metoprolol and losartan   Discussed the use of AI scribe software for clinical note transcription with the patient, who gave verbal consent to proceed.  History of Present Illness Wesley Adams is a 58 year old male with hypertension who presents for follow-up of blood pressure management and recent sleep study. His nephew took care of his kids during the night of the sleep study.  In February, his blood pressure was recorded at 154/86 mmHg, prompting a change in his medication regimen. He was started on metoprolol 25 mg twice daily and losartan 50 mg daily. Since then, his blood pressure has improved to 128/76 mmHg. He is doing 'a little bit better' but acknowledges the need to lose weight, having lost three pounds over the past five weeks. His current medications include metoprolol 25 mg twice daily and losartan 50 mg daily, which he has been taking consistently.  He underwent a sleep study the previous night, which was challenging due to the setup process and difficulty sleeping. He did not sleep enough for the CPAP titration to be completed. He arrived at the sleep study at 8:00 PM, and the setup was completed by 9:30 PM. He managed to doze off briefly but had to use the bathroom, which further disrupted his sleep. He did not achieve sufficient sleep for the CPAP mask to be applied. .    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   Lumbar radiculopathy 11/25/2020   Lumbar spondylosis 05/03/2018   Coronary artery disease involving native heart 12/12/2017   Iliac artery stenosis, left (HCC)  12/06/2017   Atherosclerosis of aorta (HCC) 12/06/2017   Chronic bilateral low back pain with bilateral sciatica 10/03/2017   Chronic pain syndrome 10/03/2017   Tobacco use 05/27/2017   Other intervertebral disc degeneration, lumbar region 01/30/2015    Past Surgical History:  Procedure Laterality Date   COLONOSCOPY WITH PROPOFOL N/A 06/01/2017   Procedure: COLONOSCOPY WITH PROPOFOL;  Surgeon: Toney Reil, MD;  Location: [facility];  Service: Endoscopy;  Laterality: N/A;   ESOPHAGOGASTRODUODENOSCOPY (EGD) WITH PROPOFOL  06/01/2017   Procedure: ESOPHAGOGASTRODUODENOSCOPY (EGD) WITH PROPOFOL;  Surgeon: Toney Reil, MD;  Location: [facility];  Service: Endoscopy;;   FEMUR FRACTURE SURGERY Right 1985   he has a rod   LEFT HEART CATH AND CORONARY ANGIOGRAPHY N/A 12/08/2017   Procedure: LEFT HEART CATH AND CORONARY ANGIOGRAPHY;  Surgeon: Lamar Blinks, MD;  Location: [facility] INVASIVE CV LAB;  Service: Cardiovascular;  Laterality: N/A;   POLYPECTOMY  06/01/2017   Procedure: POLYPECTOMY;  Surgeon: Toney Reil, MD;  Location: [facility];  Service: Endoscopy;;   POSTERIOR LAMINECTOMY / DECOMPRESSION LUMBAR SPINE  01/30/2015   with fusion, Dr. Lovell Sheehan   SPINAL CORD STIMULATOR INSERTION  03/23/2017   Dr Tressie Stalker, [facility] Neurosurgery   SPINAL CORD STIMULATOR REMOVAL N/A 06/16/2017   Procedure: SPINAL CORD STIMULATOR REMOVAL;  Surgeon: Tressie Stalker, MD;  Location: [facility] OR;  Service: Neurosurgery;  Laterality: N/A;    Family History  Problem Relation Age of Onset  Alcohol abuse Mother    Heart attack Father    Heart disease Father    Obesity Father    Heart attack Paternal Grandfather     Social History   Tobacco Use   Smoking status: Every Day    Current packs/day: 1.00    Average packs/day: 1 pack/day for 40.0 years (40.0 ttl pk-yrs)    Types: Cigarettes    Start date: 08/02/1983   Smokeless tobacco: Never  Substance Use  Topics   Alcohol use: Yes    Alcohol/week: 0.0 standard drinks of alcohol    Comment: occasionally drinks beer     Current Outpatient Medications:    aspirin EC 81 MG tablet, Take 1 tablet (81 mg total) by mouth daily. Swallow whole., Disp: , Rfl:    atorvastatin (LIPITOR) 40 MG tablet, Take 1 tablet (40 mg total) by mouth daily., Disp: 90 tablet, Rfl: 0   celecoxib (CELEBREX) 100 MG capsule, Take 1 capsule (100 mg total) by mouth 2 (two) times daily., Disp: 180 capsule, Rfl: 1   cetirizine (ZYRTEC) 10 MG tablet, Take 1 tablet (10 mg total) by mouth daily., Disp: 30 tablet, Rfl: 0   fluticasone (FLONASE) 50 MCG/ACT nasal spray, Place 2 sprays into both nostrils daily., Disp: 16 g, Rfl: 0   fluticasone-salmeterol (ADVAIR) 100-50 MCG/ACT AEPB, Inhale 1 puff into the lungs 2 (two) times daily., Disp: 1 each, Rfl: 2   losartan (COZAAR) 50 MG tablet, Take 1 tablet (50 mg total) by mouth daily., Disp: 30 tablet, Rfl: 0   metoprolol tartrate (LOPRESSOR) 25 MG tablet, Take 1 tablet (25 mg total) by mouth 2 (two) times daily., Disp: 180 tablet, Rfl: 3   nitroGLYCERIN (NITROSTAT) 0.4 MG SL tablet, Place 1 tablet (0.4 mg total) under the tongue every 5 (five) minutes as needed for chest pain., Disp: 25 tablet, Rfl: 6   omeprazole (PRILOSEC) 40 MG capsule, Take 1 capsule (40 mg total) by mouth daily., Disp: 90 capsule, Rfl: 0   tadalafil (CIALIS) 20 MG tablet, Take 0.5-1 tablets (10-20 mg total) by mouth every other day as needed for erectile dysfunction., Disp: 30 tablet, Rfl: 1   tiZANidine (ZANAFLEX) 4 MG tablet, Take 1 tablet (4 mg total) by mouth at bedtime., Disp: 90 tablet, Rfl: 1  No Known Allergies  I personally reviewed active problem list, medication list, allergies with the patient/caregiver today.   ROS  Ten systems reviewed and is negative except as mentioned in HPI    Objective Physical Exam VITALS: P- 63, BP- 128/76 CONSTITUTIONAL: Patient appears well-developed and  well-nourished. No distress. HEENT: Head atraumatic, normocephalic, neck supple. CARDIOVASCULAR: Normal rate, regular rhythm and normal heart sounds. No murmur heard. No BLE edema. PULMONARY: Effort normal and breath sounds clear. No respiratory distress. ABDOMINAL: There is no tenderness or distention. MUSCULOSKELETAL: Normal gait but slow to get up from chair due to back pain PSYCHIATRIC: Patient has a normal mood and affect. Behavior is normal. Judgment and thought content normal.  Vitals:   07/21/23 0948  BP: 128/76  Pulse: 63  Resp: 16  SpO2: 99%  Weight: 227 lb 1.6 oz (103 kg)  Height: 6\' 1"  (1.854 m)    Body mass index is 29.96 kg/m.  Recent Results (from the past 2160 hours)  Troponin T, STAT ([facility])     Status: None   Collection Time: 05/11/23 11:19 AM  Result Value Ref Range   Troponin T (Highly Sensitive) 16 0 - 22 ng/L    Comment: In order  to distinguish acute elevations of high sensitive Troponin from other clinical conditions, the Universal Definition of myocardial infarction stresses clinical assessment and the need for serial measurements to observe a rise and/or fall above the upper limit of the reference interval.     Diabetic Foot Exam:     PHQ2/9:    06/13/2023    9:24 AM 03/22/2023    8:56 AM 02/08/2023   11:39 AM 02/07/2023    9:33 AM 01/03/2023    8:41 AM  Depression screen PHQ 2/9  Decreased Interest 0 0 1 1 0  Down, Depressed, Hopeless 0 0 1 1 0  PHQ - 2 Score 0 0 2 2 0  Altered sleeping 0  0 0   Tired, decreased energy 0  1 1   Change in appetite 0  0 0   Feeling bad or failure about yourself  0  0 0   Trouble concentrating 0  0 0   Moving slowly or fidgety/restless 0  0 0   Suicidal thoughts 0  0 0   PHQ-9 Score 0  3 3   Difficult doing work/chores Not difficult at all   Somewhat difficult     phq 9 is negative  Fall Risk:    06/13/2023    9:24 AM 03/22/2023    8:56 AM 02/08/2023   11:39 AM 02/07/2023    9:33 AM 01/03/2023     8:41 AM  Fall Risk   Falls in the past year? 0 0 0 0 0  Number falls in past yr: 0 0 0  0  Injury with Fall? 0 0 0  0  Risk for fall due to : No Fall Risks No Fall Risks No Fall Risks No Fall Risks No Fall Risks  Follow up Falls prevention discussed;Education provided;Falls evaluation completed Falls prevention discussed;Education provided;Falls evaluation completed Falls prevention discussed Falls prevention discussed;Education provided;Falls evaluation completed Falls prevention discussed      Assessment & Plan Hypertension Previously uncontrolled, now well-controlled with medication and lifestyle changes. Encouraged continued weight loss. - Continue metoprolol 25 mg twice daily. - Continue losartan 50 mg daily. - Provide a 90-day supply of losartan. - Encourage weight loss and healthy lifestyle changes.  Obstructive Sleep Apnea (suspected) Sleep study incomplete for CPAP titration. May need titration depending on results  - Await results of the sleep study. - If positive, schedule a follow-up sleep study for CPAP titration.  Erectile Dysfunction Refill for Cialis confirmed available. - Confirm availability of Cialis refill at pharmacy.

## 2023-07-22 ENCOUNTER — Encounter: Payer: Self-pay | Admitting: Family Medicine

## 2023-07-25 ENCOUNTER — Other Ambulatory Visit: Payer: Self-pay

## 2023-07-25 DIAGNOSIS — R0683 Snoring: Secondary | ICD-10-CM

## 2023-07-25 NOTE — Progress Notes (Signed)
 Referral ordered

## 2023-09-02 ENCOUNTER — Encounter (HOSPITAL_COMMUNITY): Payer: Self-pay

## 2023-09-09 ENCOUNTER — Ambulatory Visit (INDEPENDENT_AMBULATORY_CARE_PROVIDER_SITE_OTHER): Admitting: Family Medicine

## 2023-09-09 ENCOUNTER — Encounter: Payer: Self-pay | Admitting: Family Medicine

## 2023-09-09 VITALS — BP 134/70 | HR 64 | Temp 97.5°F | Ht 73.0 in | Wt 228.0 lb

## 2023-09-09 DIAGNOSIS — I700 Atherosclerosis of aorta: Secondary | ICD-10-CM

## 2023-09-09 DIAGNOSIS — J438 Other emphysema: Secondary | ICD-10-CM | POA: Diagnosis not present

## 2023-09-09 DIAGNOSIS — I771 Stricture of artery: Secondary | ICD-10-CM | POA: Diagnosis not present

## 2023-09-09 DIAGNOSIS — K219 Gastro-esophageal reflux disease without esophagitis: Secondary | ICD-10-CM

## 2023-09-09 DIAGNOSIS — N528 Other male erectile dysfunction: Secondary | ICD-10-CM

## 2023-09-09 DIAGNOSIS — I25119 Atherosclerotic heart disease of native coronary artery with unspecified angina pectoris: Secondary | ICD-10-CM | POA: Diagnosis not present

## 2023-09-09 DIAGNOSIS — G4733 Obstructive sleep apnea (adult) (pediatric): Secondary | ICD-10-CM

## 2023-09-09 DIAGNOSIS — M5441 Lumbago with sciatica, right side: Secondary | ICD-10-CM

## 2023-09-09 DIAGNOSIS — Z981 Arthrodesis status: Secondary | ICD-10-CM

## 2023-09-09 DIAGNOSIS — M5442 Lumbago with sciatica, left side: Secondary | ICD-10-CM

## 2023-09-09 DIAGNOSIS — I10 Essential (primary) hypertension: Secondary | ICD-10-CM

## 2023-09-09 DIAGNOSIS — M79601 Pain in right arm: Secondary | ICD-10-CM

## 2023-09-09 DIAGNOSIS — G8929 Other chronic pain: Secondary | ICD-10-CM

## 2023-09-09 MED ORDER — TADALAFIL 20 MG PO TABS: 10.0000 mg | ORAL_TABLET | ORAL | 1 refills | Status: DC | PRN

## 2023-09-09 MED ORDER — ATORVASTATIN CALCIUM 40 MG PO TABS: 40.0000 mg | ORAL_TABLET | Freq: Every day | ORAL | 1 refills | Status: DC

## 2023-09-09 MED ORDER — OMEPRAZOLE 40 MG PO CPDR: 40.0000 mg | DELAYED_RELEASE_CAPSULE | Freq: Every day | ORAL | 1 refills | Status: DC

## 2023-09-09 MED ORDER — BREZTRI AEROSPHERE 160-9-4.8 MCG/ACT IN AERO: 2.0000 | INHALATION_SPRAY | Freq: Two times a day (BID) | RESPIRATORY_TRACT | 5 refills | Status: DC

## 2023-09-09 NOTE — Progress Notes (Signed)
 Name: Wesley Adams   MRN: [id number]    DOB: June 17, 1965   Date:09/09/2023       Progress Note  Subjective  Chief Complaint  Chief Complaint  Patient presents with   Medical Management of Chronic Issues   Discussed the use of AI scribe software for clinical note transcription with the patient, who gave verbal consent to proceed.  History of Present Illness Wesley Adams is a 58 year old male with hypertension and coronary artery disease who presents for follow-up of his blood pressure and arm pain.  He was previously seen in February for a regular follow-up and BP was elevated, we started him on Losartan  50 mg and during his follow up in March bp was at goal, down to 128/76 mmHg. However, today his blood pressure is slightly elevated again at 142/62 mmHg when he arrived but normalized before he left the office . No dizziness or side effects from losartan .  He experiences arm pain after attempting to stop a lawnmower from rolling off a trailer last week. The pain is described as 'hurts like a toothache' and is exacerbated by certain movements of the arm. Squeezing the arm does not elicit pain, but specific arm positions do. The pain affects his sleep, as he cannot find a comfortable position at night. The pain is localized to the forearm and biceps area, with no shoulder involvement , no bruising or muscle deformities or swelling .  Regarding his coronary artery disease with angina, he reports no current chest pain and has not needed to use nitroglycerin . He continues to take atorvastatin  40 mg daily and metoprolol  twice a day. He has a history of atherosclerosis of the aorta and iliac artery stenosis on the left side, but no procedures have been performed. He does not take aspirin  regularly.  He has a history of smoking since age 75 and currently smokes a significant amount daily. He acknowledges a smoker's cough and recent lung cancer screening showing emphysema. He uses Advair for his  emphysema but continues to have daily cough and symptoms worse at night. Denies wheezing and SOB has improved over the past month.  He has severe obstructive sleep apnea with an AHI of 36 and oxygen saturation dropping to 84% during sleep. He has not completed the recommended follow-up sleep study for CPAP titration.  He experiences chronic low back pain with bilateral leg radiation, following a lumbar fusion. He has seen multiple specialists and has tried various treatments, including Celebrex  and tizanidine , but does not take Celebrex  regularly. He manages his pain with tizanidine  as needed, particularly after physical activities like mowing the lawn. He has tried Lyrica  and gabapentin  in the past without success. He reports difficulty with daily activities due to back pain, requiring assistance from his children for household chores. He can cook but needs help with cleaning. He is involved in his children's activities, such as sports and gymnastics, but finds prolonged standing or sitting challenging.    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   Lumbar radiculopathy 11/25/2020   Lumbar spondylosis 05/03/2018   Coronary artery disease involving native heart 12/12/2017   Iliac artery stenosis, left (HCC) 12/06/2017   Atherosclerosis of aorta (HCC) 12/06/2017   Chronic bilateral low back pain with bilateral sciatica 10/03/2017   Chronic pain syndrome 10/03/2017   Tobacco use 05/27/2017  Other intervertebral disc degeneration, lumbar region 01/30/2015    Past Surgical History:  Procedure Laterality Date   COLONOSCOPY WITH PROPOFOL  N/A 06/01/2017   Procedure: COLONOSCOPY WITH PROPOFOL ;  Surgeon: Selena Daily, MD;  Location: [facility];  Service: Endoscopy;  Laterality: N/A;   ESOPHAGOGASTRODUODENOSCOPY (EGD) WITH  PROPOFOL   06/01/2017   Procedure: ESOPHAGOGASTRODUODENOSCOPY (EGD) WITH PROPOFOL ;  Surgeon: Selena Daily, MD;  Location: [facility];  Service: Endoscopy;;   FEMUR FRACTURE SURGERY Right 1985   he has a rod   LEFT HEART CATH AND CORONARY ANGIOGRAPHY N/A 12/08/2017   Procedure: LEFT HEART CATH AND CORONARY ANGIOGRAPHY;  Surgeon: Michelle Aid, MD;  Location: [facility] INVASIVE CV LAB;  Service: Cardiovascular;  Laterality: N/A;   POLYPECTOMY  06/01/2017   Procedure: POLYPECTOMY;  Surgeon: Selena Daily, MD;  Location: [facility];  Service: Endoscopy;;   POSTERIOR LAMINECTOMY / DECOMPRESSION LUMBAR SPINE  01/30/2015   with fusion, Dr. Larrie Po   SPINAL CORD STIMULATOR INSERTION  03/23/2017   Dr Garry Kansas, [facility] Neurosurgery   SPINAL CORD STIMULATOR REMOVAL N/A 06/16/2017   Procedure: SPINAL CORD STIMULATOR REMOVAL;  Surgeon: Garry Kansas, MD;  Location: [facility] OR;  Service: Neurosurgery;  Laterality: N/A;    Family History  Problem Relation Age of Onset   Alcohol abuse Mother    Heart attack Father    Heart disease Father    Obesity Father    Heart attack Paternal Grandfather     Social History   Tobacco Use   Smoking status: Every Day    Current packs/day: 1.00    Average packs/day: 1 pack/day for 40.1 years (40.1 ttl pk-yrs)    Types: Cigarettes    Start date: 08/02/1983   Smokeless tobacco: Never  Substance Use Topics   Alcohol use: Yes    Alcohol/week: 0.0 standard drinks of alcohol    Comment: occasionally drinks beer     Current Outpatient Medications:    aspirin  EC 81 MG tablet, Take 1 tablet (81 mg total) by mouth daily. Swallow whole., Disp: , Rfl:    budesonide-glycopyrrolate -formoterol (BREZTRI AEROSPHERE) 160-9-4.8 MCG/ACT AERO inhaler, Inhale 2 puffs into the lungs in the morning and at bedtime., Disp: 10.7 g, Rfl: 5   fluticasone  (FLONASE ) 50 MCG/ACT nasal spray, Place 2 sprays into both nostrils daily., Disp: 16 g, Rfl: 0    losartan  (COZAAR ) 50 MG tablet, Take 1 tablet (50 mg total) by mouth daily., Disp: 90 tablet, Rfl: 1   metoprolol  tartrate (LOPRESSOR ) 25 MG tablet, Take 1 tablet (25 mg total) by mouth 2 (two) times daily., Disp: 180 tablet, Rfl: 3   nitroGLYCERIN  (NITROSTAT ) 0.4 MG SL tablet, Place 1 tablet (0.4 mg total) under the tongue every 5 (five) minutes as needed for chest pain., Disp: 25 tablet, Rfl: 6   tiZANidine  (ZANAFLEX ) 4 MG tablet, Take 1 tablet (4 mg total) by mouth at bedtime., Disp: 90 tablet, Rfl: 1   atorvastatin  (LIPITOR) 40 MG tablet, Take 1 tablet (40 mg total) by mouth daily., Disp: 90 tablet, Rfl: 1   celecoxib  (CELEBREX ) 100 MG capsule, Take 1 capsule (100 mg total) by mouth 2 (two) times daily. (Patient not taking: Reported on 09/09/2023), Disp: 180 capsule, Rfl: 1   cetirizine  (ZYRTEC ) 10 MG tablet, Take 1 tablet (10 mg total) by mouth daily. (Patient not taking: Reported on 09/09/2023), Disp: 30 tablet, Rfl: 0   omeprazole  (PRILOSEC) 40 MG capsule, Take 1 capsule (40 mg total) by mouth daily., Disp: 90 capsule,  Rfl: 1   tadalafil  (CIALIS ) 20 MG tablet, Take 0.5-1 tablets (10-20 mg total) by mouth every other day as needed for erectile dysfunction., Disp: 30 tablet, Rfl: 1  No Known Allergies  I personally reviewed active problem list, medication list, allergies, family history with the patient/caregiver today.   ROS  Ten systems reviewed and is negative except as mentioned in HPI    Objective Physical Exam VITALS: BP- 142/62 MEASUREMENTS: Weight- 228. CONSTITUTIONAL: Patient appears well-developed and well-nourished. No distress. HEENT: Head atraumatic, normocephalic, neck supple. CARDIOVASCULAR: Normal rate, regular rhythm and normal heart sounds. No murmur heard. No BLE edema. PULMONARY: Effort normal and breath sounds normal. No respiratory distress. ABDOMINAL: There is no tenderness or distention. MUSCULOSKELETAL: Normal gait. Without gross motor or sensory deficit.  Swelling to the right of the scar on the back. Spine flexion is good. Spine extension is limited. Lateral bending of the spine is almost normal. Elbows normal. Shoulders normal. Biceps tendon right arm seems intact PSYCHIATRIC: Patient has a normal mood and affect. Behavior is normal. Judgment and thought content normal.  Vitals:   09/09/23 1016 09/09/23 1106  BP: (!) 142/62 134/70  Pulse: 64   Temp: (!) 97.5 F (36.4 C)   TempSrc: Oral   SpO2: 100%   Weight: 228 lb (103.4 kg)   Height: 6\' 1"  (1.854 m)     Body mass index is 30.08 kg/m.   PHQ2/9:    09/09/2023   10:15 AM 06/13/2023    9:24 AM 03/22/2023    8:56 AM 02/08/2023   11:39 AM 02/07/2023    9:33 AM  Depression screen PHQ 2/9  Decreased Interest 0 0 0 1 1  Down, Depressed, Hopeless 0 0 0 1 1  PHQ - 2 Score 0 0 0 2 2  Altered sleeping 0 0  0 0  Tired, decreased energy 0 0  1 1  Change in appetite 0 0  0 0  Feeling bad or failure about yourself  0 0  0 0  Trouble concentrating 0 0  0 0  Moving slowly or fidgety/restless 0 0  0 0  Suicidal thoughts 0 0  0 0  PHQ-9 Score 0 0  3 3  Difficult doing work/chores Not difficult at all Not difficult at all   Somewhat difficult    phq 9 is negative  Fall Risk:    09/09/2023   10:15 AM 06/13/2023    9:24 AM 03/22/2023    8:56 AM 02/08/2023   11:39 AM 02/07/2023    9:33 AM  Fall Risk   Falls in the past year? 0 0 0 0 0  Number falls in past yr: 0 0 0 0   Injury with Fall? 0 0 0 0   Risk for fall due to : No Fall Risks No Fall Risks No Fall Risks No Fall Risks No Fall Risks  Follow up Falls evaluation completed Falls prevention discussed;Education provided;Falls evaluation completed Falls prevention discussed;Education provided;Falls evaluation completed Falls prevention discussed Falls prevention discussed;Education provided;Falls evaluation completed      Assessment & Plan Severe obstructive sleep apnea Severe obstructive sleep apnea with AHI of 36 and oxygen  saturation of 84%. Discussed autopap as an alternative to in-lab titration study. - Order autopap with pressure settings 4-16 cm H2O. - Educate on CPAP use and mask fitting. - Encourage use of CPAP for at least 4 hours per night.  Hypertension Blood pressure elevated at 142/62. No side effects from losartan . CPAP may help  control blood pressure. - Check blood pressure before leaving the office. - Continue losartan  50 mg daily. - Reassess blood pressure after CPAP initiation.  Coronary artery disease with angina No recent chest pain or nitroglycerin  use. Managed with atorvastatin , metoprolol , and lifestyle modifications. - Continue atorvastatin  40 mg daily. - Continue metoprolol  as prescribed by cardiologist.  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 Smoking cessation is critical. - Continue current management with blood pressure and cholesterol medications. - Encourage smoking cessation.  Emphysema Mild diffuse bronchial wall thickening and emphysema on imaging. Smoker's cough present. Discussed switching to Breztri. - Replace Advair with Breztri, two puffs twice daily. - Encourage smoking cessation.  Nicotine  dependence Long-term smoker with increased smoking habits. Emphysema and vascular disease exacerbated by smoking. Discussed gradual reduction strategy and nicotine  replacement options. - Encourage smoking cessation. - Discuss nicotine  replacement options such as gum or pouches - Consider Chantix if covered by insurance.  Chronic low back pain with lumbar fusion Chronic low back pain with bilateral leg pain. Lumbar fusion history. Pain management options limited due to insurance denial of spinal stimulator. - Continue tizanidine  and Celebrex  as needed. - Discuss alternative pain management options such as mindfulness and dry needling.

## 2023-09-26 ENCOUNTER — Telehealth: Payer: Self-pay | Admitting: Family Medicine

## 2023-09-26 DIAGNOSIS — M79601 Pain in right arm: Secondary | ICD-10-CM

## 2023-09-26 NOTE — Telephone Encounter (Signed)
 Having severe right arm pain. Pain wakes him out of sleep at night. You had suggested it was a pulled muscle but he think he has tore something inside of arm. Would like xray order

## 2023-09-27 NOTE — Telephone Encounter (Signed)
 Pt states he mentioned it in previous OV, some documentation was noted.

## 2023-09-28 NOTE — Telephone Encounter (Signed)
 No answer from pt left detailed vm to call back.

## 2023-09-28 NOTE — Addendum Note (Signed)
 Addended by: RENTERIA-GARCIA, Brinnley Lacap on: 09/28/2023 09:01 AM   Modules accepted: Orders

## 2023-09-29 DIAGNOSIS — M79631 Pain in right forearm: Secondary | ICD-10-CM | POA: Insufficient documentation

## 2023-09-29 DIAGNOSIS — G8929 Other chronic pain: Secondary | ICD-10-CM | POA: Insufficient documentation

## 2023-09-29 DIAGNOSIS — M25521 Pain in right elbow: Secondary | ICD-10-CM | POA: Insufficient documentation

## 2023-11-24 ENCOUNTER — Ambulatory Visit (INDEPENDENT_AMBULATORY_CARE_PROVIDER_SITE_OTHER): Admitting: Family Medicine

## 2023-11-24 ENCOUNTER — Encounter: Payer: Self-pay | Admitting: Family Medicine

## 2023-11-24 VITALS — BP 128/74 | HR 91 | Resp 16 | Ht 73.0 in | Wt 233.6 lb

## 2023-11-24 DIAGNOSIS — J209 Acute bronchitis, unspecified: Secondary | ICD-10-CM | POA: Diagnosis not present

## 2023-11-24 DIAGNOSIS — J438 Other emphysema: Secondary | ICD-10-CM

## 2023-11-24 DIAGNOSIS — G4733 Obstructive sleep apnea (adult) (pediatric): Secondary | ICD-10-CM

## 2023-11-24 DIAGNOSIS — J42 Unspecified chronic bronchitis: Secondary | ICD-10-CM | POA: Diagnosis not present

## 2023-11-24 MED ORDER — PREDNISONE 10 MG PO TABS: 10.0000 mg | ORAL_TABLET | Freq: Two times a day (BID) | ORAL | 0 refills | Status: DC

## 2023-11-24 MED ORDER — AMOXICILLIN-POT CLAVULANATE 875-125 MG PO TABS: 1.0000 | ORAL_TABLET | Freq: Two times a day (BID) | ORAL | 0 refills | Status: DC

## 2023-11-24 NOTE — Patient Instructions (Signed)
 Brezti for cough and emphysema

## 2023-11-24 NOTE — Progress Notes (Signed)
 Name: Wesley Adams   MRN: [id number]    DOB: 02-Nov-1965   Date:11/24/2023       Progress Note  Subjective  Chief Complaint  Chief Complaint  Patient presents with   Nasal Congestion    Green mucus, and coughing it up, drainage. Ongoing for a month ago right after using CPAP machine   Discussed the use of AI scribe software for clinical note transcription with the patient, who gave verbal consent to proceed.  History of Present Illness Wesley Adams is a 58 year old male with emphysema who presents with increased cough and respiratory symptoms.  Over the past month, he has experienced an increase in his cough, which is more frequent than his usual smoker's cough. He attributes this change to the initiation of CPAP therapy. He continues to smoke approximately one pack of cigarettes per day. A CT scan from 2024 was performed.  He experiences wheezing, which he describes as 'kind of, sort of' present, and notes that his cough has become more productive and mucousy. He also reports a foul-tasting drainage, which he finds unpleasant.  He is not currently using the Breztri  inhaler that was prescribed in May, as he does not recall receiving it. He has been prescribed prednisone  in the past and is not allergic to it. He takes an anti-inflammatory medication for joint pain, which worsens at night and in the morning, making it 'unbearable'.  He has gained weight, now weighing 233 pounds, and feels this has impacted his physical abilities, stating, 'I can't do the things I used to do.' He is concerned about his weight gain and its effect on his health.  He has Medicaid and [employer], with Medicaid being the primary for prescriptions. [facility].    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    Lumbar radiculopathy 11/25/2020   Lumbar spondylosis 05/03/2018   Coronary artery disease involving native heart 12/12/2017   Iliac artery stenosis, left (HCC) 12/06/2017   Atherosclerosis of aorta (HCC) 12/06/2017   Chronic bilateral low back pain with bilateral sciatica 10/03/2017   Chronic pain syndrome 10/03/2017   Tobacco use 05/27/2017   Other intervertebral disc degeneration, lumbar region 01/30/2015    Past Surgical History:  Procedure Laterality Date   COLONOSCOPY WITH PROPOFOL  N/A 06/01/2017   Procedure: COLONOSCOPY WITH PROPOFOL ;  Surgeon: Unk Corinn Skiff, MD;  Location: [facility];  Service: Endoscopy;  Laterality: N/A;   ESOPHAGOGASTRODUODENOSCOPY (EGD) WITH PROPOFOL   06/01/2017   Procedure: ESOPHAGOGASTRODUODENOSCOPY (EGD) WITH PROPOFOL ;  Surgeon: Unk Corinn Skiff, MD;  Location: [facility];  Service: Endoscopy;;   FEMUR FRACTURE SURGERY Right 1985   he has a rod   LEFT HEART CATH AND CORONARY ANGIOGRAPHY N/A 12/08/2017   Procedure: LEFT HEART CATH AND CORONARY ANGIOGRAPHY;  Surgeon: Hester Wolm PARAS, MD;  Location: [facility] INVASIVE CV LAB;  Service: Cardiovascular;  Laterality: N/A;   POLYPECTOMY  06/01/2017   Procedure: POLYPECTOMY;  Surgeon: Unk Corinn Skiff, MD;  Location: [facility];  Service: Endoscopy;;   POSTERIOR LAMINECTOMY / DECOMPRESSION LUMBAR SPINE  01/30/2015   with fusion, Dr. Mavis   SPINAL CORD STIMULATOR INSERTION  03/23/2017   Dr Reyes Mavis, [facility] Neurosurgery   SPINAL CORD STIMULATOR REMOVAL N/A 06/16/2017   Procedure: SPINAL CORD STIMULATOR REMOVAL;  Surgeon: Mavis Reyes, MD;  Location: [facility] OR;  Service: Neurosurgery;  Laterality: N/A;   SPINE SURGERY      Family History  Problem Relation Age of Onset   Alcohol abuse Mother    Heart attack Father    Heart disease Father    Obesity Father    Heart attack Paternal Grandfather     Social History   Tobacco Use   Smoking status: Every Day     Current packs/day: 1.00    Average packs/day: 1 pack/day for 40.3 years (40.3 ttl pk-yrs)    Types: Cigarettes    Start date: 08/02/1983   Smokeless tobacco: Never  Substance Use Topics   Alcohol use: Yes    Alcohol/week: 0.0 standard drinks of alcohol    Comment: occasionally drinks beer     Current Outpatient Medications:    aspirin  EC 81 MG tablet, Take 1 tablet (81 mg total) by mouth daily. Swallow whole., Disp: , Rfl:    atorvastatin  (LIPITOR) 40 MG tablet, Take 1 tablet (40 mg total) by mouth daily., Disp: 90 tablet, Rfl: 1   budesonide-glycopyrrolate -formoterol (BREZTRI  AEROSPHERE) 160-9-4.8 MCG/ACT AERO inhaler, Inhale 2 puffs into the lungs in the morning and at bedtime., Disp: 10.7 g, Rfl: 5   diclofenac  (VOLTAREN ) 75 MG EC tablet, TAKE 1 TABLET BY MOUTH 2 TIMES A DAY WITH MEALS, Disp: , Rfl:    fluticasone  (FLONASE ) 50 MCG/ACT nasal spray, Place 2 sprays into both nostrils daily., Disp: 16 g, Rfl: 0   losartan  (COZAAR ) 50 MG tablet, Take 1 tablet (50 mg total) by mouth daily., Disp: 90 tablet, Rfl: 1   metoprolol  tartrate (LOPRESSOR ) 25 MG tablet, Take 1 tablet (25 mg total) by mouth 2 (two) times daily., Disp: 180 tablet, Rfl: 3   nitroGLYCERIN  (NITROSTAT ) 0.4 MG SL tablet, Place 1 tablet (0.4 mg total) under the tongue every 5 (five) minutes as needed for chest pain., Disp: 25 tablet, Rfl: 6   omeprazole  (PRILOSEC) 40 MG capsule, Take 1 capsule (40 mg total) by mouth daily., Disp: 90 capsule, Rfl: 1   tadalafil  (CIALIS ) 20 MG tablet, Take 0.5-1 tablets (10-20 mg total) by mouth every other day as needed for erectile dysfunction., Disp: 30 tablet, Rfl: 1   tiZANidine  (ZANAFLEX ) 4 MG tablet, Take 1 tablet (4 mg total) by mouth at bedtime., Disp: 90 tablet, Rfl: 1   celecoxib  (CELEBREX ) 100 MG capsule, Take 1 capsule (100 mg total) by mouth 2 (two) times daily. (Patient not taking: Reported on 11/24/2023), Disp: 180 capsule, Rfl: 1   cetirizine  (ZYRTEC ) 10 MG tablet, Take 1 tablet (10  mg total) by mouth daily. (Patient not taking: Reported on 11/24/2023), Disp: 30 tablet, Rfl: 0  No Known Allergies  I personally reviewed active problem list, medication list, allergies with the patient/caregiver today.   ROS  Ten systems reviewed and is negative except as mentioned in HPI    Objective Physical Exam  CONSTITUTIONAL: Patient appears well-developed and well-nourished. No distress. HEENT: Head atraumatic, normocephalic, neck supple. CARDIOVASCULAR: Normal rate, regular rhythm and normal heart sounds. No murmur heard. No BLE edema. PULMONARY: Effort normal. Breath sounds reveal bronchi and wheezing, no crackles. No respiratory distress. PSYCHIATRIC: Patient has a normal mood and affect. Behavior is normal. Judgment and thought content normal.  Vitals:   11/24/23 1307  BP: 128/74  Pulse: 91  Resp: 16  SpO2: 97%  Weight: 233 lb 9.6 oz (106 kg)  Height: 6' 1 (1.854 m)    Body mass index is 30.82 kg/m.  No  results found for this or any previous visit (from the past 2160 hours).  Diabetic Foot Exam:     PHQ2/9:    11/24/2023    1:02 PM 09/09/2023   10:15 AM 06/13/2023    9:24 AM 03/22/2023    8:56 AM 02/08/2023   11:39 AM  Depression screen PHQ 2/9  Decreased Interest 0 0 0 0 1  Down, Depressed, Hopeless 0 0 0 0 1  PHQ - 2 Score 0 0 0 0 2  Altered sleeping  0 0  0  Tired, decreased energy  0 0  1  Change in appetite  0 0  0  Feeling bad or failure about yourself   0 0  0  Trouble concentrating  0 0  0  Moving slowly or fidgety/restless  0 0  0  Suicidal thoughts  0 0  0  PHQ-9 Score  0 0  3  Difficult doing work/chores  Not difficult at all Not difficult at all      phq 9 is negative  Fall Risk:    11/24/2023    1:02 PM 09/09/2023   10:15 AM 06/13/2023    9:24 AM 03/22/2023    8:56 AM 02/08/2023   11:39 AM  Fall Risk   Falls in the past year? 0 0 0 0 0  Number falls in past yr: 0 0 0 0 0  Injury with Fall? 0 0 0 0 0  Risk for fall due to :  No Fall Risks No Fall Risks No Fall Risks No Fall Risks No Fall Risks  Follow up Falls evaluation completed Falls evaluation completed Falls prevention discussed;Education provided;Falls evaluation completed Falls prevention discussed;Education provided;Falls evaluation completed Falls prevention discussed      Assessment & Plan Chronic bronchitis with acute exacerbation and emphysema Chronic bronchitis exacerbation with increased cough and congestion. Emphysema confirmed by CT. Differential includes COPD; pulmonary function test needed. Symptoms may be infection-related. Wheezing likely due to lung condition. - Prescribe prednisone  with food twice daily for five days. - Prescribe Augmentin  for seven days. - Ensure Breztri  inhaler pickup from pharmacy. - Advise against Aquafinax, ibuprofen , or Aleve while on prednisone .  Obesity Weight gain to 233 pounds, affecting activities. Discussed insurance coverage for weight loss medication. - Advise contacting insurance for weight loss medication coverage. - Encourage weight loss for health improvement.  OSA Continue CPAP use   Suspected acute sinus infection Reports drainage with foul taste, suggestive of sinus infection. Productive cough likely from lung condition. - Recommend resuming Zyrtec  and nasal spray.  Tobacco use disorder Continues smoking one pack per day, contributing to respiratory issues. - Discuss importance of smoking cessation for respiratory health.

## 2023-12-12 ENCOUNTER — Other Ambulatory Visit (HOSPITAL_COMMUNITY): Payer: Self-pay

## 2023-12-12 ENCOUNTER — Encounter: Payer: Self-pay | Admitting: Family Medicine

## 2023-12-12 ENCOUNTER — Ambulatory Visit (INDEPENDENT_AMBULATORY_CARE_PROVIDER_SITE_OTHER): Admitting: Family Medicine

## 2023-12-12 ENCOUNTER — Telehealth: Payer: Self-pay | Admitting: Pharmacy Technician

## 2023-12-12 VITALS — BP 132/82 | HR 91 | Resp 16 | Ht 73.0 in | Wt 234.1 lb

## 2023-12-12 DIAGNOSIS — G4733 Obstructive sleep apnea (adult) (pediatric): Secondary | ICD-10-CM

## 2023-12-12 DIAGNOSIS — I771 Stricture of artery: Secondary | ICD-10-CM | POA: Diagnosis not present

## 2023-12-12 DIAGNOSIS — K219 Gastro-esophageal reflux disease without esophagitis: Secondary | ICD-10-CM

## 2023-12-12 DIAGNOSIS — J410 Simple chronic bronchitis: Secondary | ICD-10-CM | POA: Diagnosis not present

## 2023-12-12 DIAGNOSIS — G8929 Other chronic pain: Secondary | ICD-10-CM

## 2023-12-12 DIAGNOSIS — I700 Atherosclerosis of aorta: Secondary | ICD-10-CM | POA: Diagnosis not present

## 2023-12-12 DIAGNOSIS — I25119 Atherosclerotic heart disease of native coronary artery with unspecified angina pectoris: Secondary | ICD-10-CM

## 2023-12-12 DIAGNOSIS — Z23 Encounter for immunization: Secondary | ICD-10-CM | POA: Diagnosis not present

## 2023-12-12 DIAGNOSIS — M5441 Lumbago with sciatica, right side: Secondary | ICD-10-CM

## 2023-12-12 DIAGNOSIS — J438 Other emphysema: Secondary | ICD-10-CM

## 2023-12-12 DIAGNOSIS — M5442 Lumbago with sciatica, left side: Secondary | ICD-10-CM

## 2023-12-12 DIAGNOSIS — I10 Essential (primary) hypertension: Secondary | ICD-10-CM

## 2023-12-12 MED ORDER — LOSARTAN POTASSIUM 50 MG PO TABS
50.0000 mg | ORAL_TABLET | Freq: Every day | ORAL | 1 refills | Status: DC
Start: 2023-12-12 — End: 2024-03-14

## 2023-12-12 MED ORDER — TIZANIDINE HCL 4 MG PO TABS: 4.0000 mg | ORAL_TABLET | Freq: Every day | ORAL | 1 refills | Status: DC

## 2023-12-12 MED ORDER — BREZTRI AEROSPHERE 160-9-4.8 MCG/ACT IN AERO: 2.0000 | INHALATION_SPRAY | Freq: Two times a day (BID) | RESPIRATORY_TRACT | 5 refills | Status: AC

## 2023-12-12 NOTE — Progress Notes (Addendum)
 Name: Wesley Adams   MRN: [id number]    DOB: Oct 27, 1965   Date:12/12/2023       Progress Note  Subjective  Chief Complaint  Chief Complaint  Patient presents with   Medical Management of Chronic Issues   Nasal Congestion    Still having drainage, not completely cleared   Discussed the use of AI scribe software for clinical note transcription with the patient, who gave verbal consent to proceed.  History of Present Illness Wesley Adams is a 58 year old male with emphysema and coronary artery disease who presents with a persistent cough and difficulty using CPAP due to coughing.  He has a persistent productive cough that has worsened since July, with difficulty clearing secretions, especially when reclining. He describes a 'nasty taste and stinky smell' associated with the cough. Previous treatment with antibiotics and prednisone  provided relief, but symptoms have recurred. He has not been using Mucinex  or his prescribed inhaler, Breztri , due to a pharmacy issue.  Previous CT scans have shown nodules, bronchial wall thickening, and emphysema. Increased coughing occurs when lying flat, affecting his ability to use his CPAP machine for obstructive sleep apnea. He uses the CPAP machine about three to four nights a week but finds it difficult to keep on due to coughing. No significant improvement in daytime fatigue with CPAP use is noted.  He reports a history of coronary artery disease and atherosclerosis and is currently taking metoprolol  25 mg twice daily, atorvastatin  40 mg daily, and losartan  for hypertension. He does not regularly take aspirin . No current chest pain or angina is reported.  He experiences chronic low back pain with radiculitis, previously managed with various medications and physical therapy. He reports a pain level of eight out of ten and notes difficulty with physical activities like mowing the lawn. He has tried Celebrex , tizanidine , and other medications without  significant relief. He has not been under active pain management recently.  He is concerned about weight gain, currently weighing 234 pounds, which he feels is affecting his overall well-being. He has not been successful in losing weight and is unsure about insurance coverage for weight loss medications.    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   Lumbar radiculopathy 11/25/2020   Lumbar spondylosis 05/03/2018   Coronary artery disease involving native heart 12/12/2017   Iliac artery stenosis, left (HCC) 12/06/2017   Atherosclerosis of aorta (HCC) 12/06/2017   Chronic bilateral low back pain with bilateral sciatica 10/03/2017   Chronic pain syndrome 10/03/2017   Tobacco use 05/27/2017   Other intervertebral disc degeneration, lumbar region 01/30/2015    Past Surgical History:  Procedure Laterality Date   COLONOSCOPY WITH PROPOFOL  N/A 06/01/2017   Procedure: COLONOSCOPY WITH PROPOFOL ;  Surgeon: Unk Corinn Skiff, MD;  Location: [facility];  Service: Endoscopy;  Laterality: N/A;   ESOPHAGOGASTRODUODENOSCOPY (EGD) WITH PROPOFOL   06/01/2017   Procedure: ESOPHAGOGASTRODUODENOSCOPY (EGD) WITH PROPOFOL ;  Surgeon: Unk Corinn Skiff, MD;  Location: [facility];  Service: Endoscopy;;   FEMUR FRACTURE SURGERY Right 1985   he has a rod   LEFT HEART CATH AND CORONARY ANGIOGRAPHY N/A 12/08/2017   Procedure: LEFT HEART CATH AND CORONARY ANGIOGRAPHY;  Surgeon: Hester Pin  J, MD;  Location: [facility] INVAS[facility] CV LAB;  Service: Cardiovascular;  Laterality: N/A;   POLYPECTOMY  06/01/2017   Procedure: POLYPECTOMY;  Surgeon: Unk Corinn Skiff, MD;  Location: [facility];  Service: Endoscopy;;    POSTERIOR LAMINECTOMY / DECOMPRESSION LUMBAR SPINE  01/30/2015   with fusion, Dr. Mavis   SPINAL CORD STIMULATOR INSERTION  03/23/2017   Dr Reyes Mavis, [facility] Neurosurgery   SPINAL CORD STIMULATOR REMOVAL N/A 06/16/2017   Procedure: SPINAL CORD STIMULATOR REMOVAL;  Surgeon: Mavis Reyes, MD;  Location: [facility] OR;  Service: Neurosurgery;  Laterality: N/A;   SPINE SURGERY      Family History  Problem Relation Age of Onset   Alcohol abuse Mother    Heart attack Father    Heart disease Father    Obesity Father    Heart attack Paternal Grandfather     Social History   Tobacco Use   Smoking status: Every Day    Current packs/day: 1.00    Average packs/day: 1 pack/day for 40.4 years (40.4 ttl pk-yrs)    Types: Cigarettes    Start date: 08/02/1983   Smokeless tobacco: Never  Substance Use Topics   Alcohol use: Yes    Alcohol/week: 0.0 standard drinks of alcohol    Comment: occasionally drinks beer     Current Outpatient Medications:    aspirin  EC 81 MG tablet, Take 1 tablet (81 mg total) by mouth daily. Swallow whole., Disp: , Rfl:    atorvastatin  (LIPITOR) 40 MG tablet, Take 1 tablet (40 mg total) by mouth daily., Disp: 90 tablet, Rfl: 1   diclofenac  (VOLTAREN ) 75 MG EC tablet, TAKE 1 TABLET BY MOUTH 2 TIMES A DAY WITH MEALS, Disp: , Rfl:    fluticasone  (FLONASE ) 50 MCG/ACT nasal spray, Place 2 sprays into both nostrils daily., Disp: 16 g, Rfl: 0   metoprolol  tartrate (LOPRESSOR ) 25 MG tablet, Take 1 tablet (25 mg total) by mouth 2 (two) times daily., Disp: 180 tablet, Rfl: 3   nitroGLYCERIN  (NITROSTAT ) 0.4 MG SL tablet, Place 1 tablet (0.4 mg total) under the tongue every 5 (five) minutes as needed for chest pain., Disp: 25 tablet, Rfl: 6   omeprazole  (PRILOSEC) 40 MG capsule, Take 1 capsule (40 mg total) by mouth daily., Disp: 90 capsule, Rfl: 1   tadalafil  (CIALIS ) 20 MG tablet, Take 0.5-1 tablets (10-20 mg total) by mouth every other day as needed for erectile  dysfunction., Disp: 30 tablet, Rfl: 1   budesonide-glycopyrrolate -formoterol (BREZTRI  AEROSPHERE) 160-9-4.8 MCG/ACT AERO inhaler, Inhale 2 puffs into the lungs in the morning and at bedtime., Disp: 10.7 g, Rfl: 5   cetirizine  (ZYRTEC ) 10 MG tablet, Take 1 tablet (10 mg total) by mouth daily. (Patient not taking: Reported on 12/12/2023), Disp: 30 tablet, Rfl: 0   losartan  (COZAAR ) 50 MG tablet, Take 1 tablet (50 mg total) by mouth daily., Disp: 90 tablet, Rfl: 1   tiZANidine  (ZANAFLEX ) 4 MG tablet, Take 1 tablet (4 mg total) by mouth at bedtime., Disp: 90 tablet, Rfl: 1  No Known Allergies  I personally reviewed active problem list, medication list, allergies, family history with the patient/caregiver today.   ROS  Ten systems reviewed and is negative except as mentioned in HPI    Objective Physical Exam  CONSTITUTIONAL: Patient appears well-developed and well-nourished. No distress. HEENT: Head atraumatic, normocephalic, neck supple. CARDIOVASCULAR: Normal rate, regular rhythm and normal heart sounds. No murmur heard. No BLE edema. PULMONARY: Effort normal and breath sounds clear. No respiratory distress. ABDOMINAL: There  is no tenderness or distention. MUSCULOSKELETAL: Normal gait. Pain during palpation of lumbar spine, negative straight leg raise PSYCHIATRIC: Patient has a normal mood and affect. Behavior is normal. Judgment and thought content normal.  Vitals:   12/12/23 1029  BP: 132/82  Pulse: 91  Resp: 16  SpO2: 97%  Weight: 234 lb 1.6 oz (106.2 kg)  Height: 6' 1 (1.854 m)    Body mass index is 30.89 kg/m.   PHQ2/9:    12/12/2023   10:25 AM 11/24/2023    1:02 PM 09/09/2023   10:15 AM 06/13/2023    9:24 AM 03/22/2023    8:56 AM  Depression screen PHQ 2/9  Decreased Interest 0 0 0 0 0  Down, Depressed, Hopeless 0 0 0 0 0  PHQ - 2 Score 0 0 0 0 0  Altered sleeping 0  0 0   Tired, decreased energy 0  0 0   Change in appetite 0  0 0   Feeling bad or failure about  yourself  0  0 0   Trouble concentrating 0  0 0   Moving slowly or fidgety/restless 0  0 0   Suicidal thoughts 0  0 0   PHQ-9 Score 0  0 0   Difficult doing work/chores Not difficult at all  Not difficult at all Not difficult at all     phq 9 is negative  Fall Risk:    12/12/2023   10:24 AM 11/24/2023    1:02 PM 09/09/2023   10:15 AM 06/13/2023    9:24 AM 03/22/2023    8:56 AM  Fall Risk   Falls in the past year? 0 0 0 0 0  Number falls in past yr: 0 0 0 0 0  Injury with Fall? 0 0 0 0 0  Risk for fall due to : No Fall Risks No Fall Risks No Fall Risks No Fall Risks No Fall Risks  Follow up Falls evaluation completed Falls evaluation completed Falls evaluation completed Falls prevention discussed;Education provided;Falls evaluation completed Falls prevention discussed;Education provided;Falls evaluation completed     Assessment & Plan Emphysema with chronic cough Chronic cough likely exacerbated by emphysema and recent infection. Persistent cough may be due to inadequate use of Breztri  inhaler. - Prescribe Breztri  inhaler and ensure pharmacy dispenses it. - Advise use of Mucinex  twice daily. - Advise resumption of allergy medication (cetirizine ). - Refer to pulmonology for further evaluation of cough and emphysema.  Severe obstructive sleep apnea Severe obstructive sleep apnea with AHI of 36 and oxygen saturation dropping to 84%. Inconsistent CPAP use due to coughing spells. CPAP compliance is crucial. - Refer to pulmonology to address sleep apnea and cough management. - Advise use of CPAP for at least four hours nightly. - Encourage consistent use of Breztri  inhaler to manage cough and improve CPAP tolerance.  Atherosclerotic cardiovascular disease (aorta, coronary artery, and left iliac artery) Atherosclerosis affecting aorta, coronary artery, and left iliac artery. Currently asymptomatic for angina. Managed with medications. - Continue metoprolol  25 mg twice daily. - Continue  atorvastatin  40 mg daily. - Continue losartan  for blood pressure management. - Initiate low-dose aspirin  81 mg daily.  Hypertension Hypertension managed with losartan  and metoprolol . Blood pressure generally controlled, slightly elevated today. - Continue losartan  and metoprolol . - Monitor blood pressure regularly.  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 Current pain level is 8.5/10. Previous pain management with Dr. Marcelino was discontinued  due to having a procedure done at [facility]. - Refer to a new pain management specialist, possibly in [place]. - Discontinue Celebrex  and diclofenac  as per recent changes by another provider, looks like Meloxicam  at pharmacy  - Failed PT for back pain  Obesity Obesity with current weight at 234 lbs, higher than previous weight of 200 lbs. Expresses desire to lose weight but insurance does not cover weight loss medication. - Advise logging dietary intake and physical activity. - Encourage use of MyFitnessPal app for tracking. - Document weight loss efforts for potential future insurance coverage.  Gastroesophageal reflux disease (GERD) GERD managed with omeprazole . - Continue omeprazole  as prescribed.  Strain of right forearm and biceps Strain of right forearm and biceps with recent prescription changes to meloxicam  and Robaxin. Physical therapy recommended. - Discontinue Voltaren  and tizanidine . - Start meloxicam  and Robaxin as prescribed. - Engage in physical therapy for forearm and biceps strain.

## 2023-12-12 NOTE — Telephone Encounter (Signed)
 Pharmacy Patient Advocate Encounter   Received notification from Onbase that prior authorization for Breztri  Aerosphere 160-9-4.8MCG/ACT aerosol  is required/requested.   Insurance verification completed.   The patient is insured through [facility] MEDICAID .   Per test claim: PA required; PA submitted to above mentioned insurance via Latent Key/confirmation #/[id number] Status is pending

## 2023-12-13 ENCOUNTER — Other Ambulatory Visit (HOSPITAL_COMMUNITY): Payer: Self-pay

## 2023-12-14 ENCOUNTER — Other Ambulatory Visit (HOSPITAL_COMMUNITY): Payer: Self-pay

## 2023-12-14 NOTE — Telephone Encounter (Signed)
 Pharmacy Patient Advocate Encounter   Received notification from Onbase that prior authorization for Breztri  Aerosphere 160-9-4.8MCG/ACT aerosol  is required/requested.   Insurance verification completed.   The patient is insured through [facility] MEDICAID .   Per test claim:  DULERA 200-5 MCG or SYMBICORT or ADVAIR is preferred by the insurance.  If suggested medication is appropriate, Please send in a new RX and discontinue this one. If not, please advise as to why it's not appropriate so that we may request a Prior Authorization. Please note, some preferred medications may still require a PA.  If the suggested medications have not been trialed and there are no contraindications to their use, the PA will not be submitted, as it will not be approved.

## 2023-12-19 ENCOUNTER — Telehealth: Payer: Self-pay | Admitting: Internal Medicine

## 2023-12-19 NOTE — Telephone Encounter (Signed)
 [facility] to schedule sleep consult. Needs to be scheduled with Dr. Isaiah.

## 2024-02-08 ENCOUNTER — Ambulatory Visit: Admitting: Internal Medicine

## 2024-02-08 NOTE — Patient Instructions (Incomplete)

## 2024-02-09 ENCOUNTER — Encounter: Payer: Self-pay | Admitting: Family Medicine

## 2024-02-21 ENCOUNTER — Telehealth: Payer: Self-pay | Admitting: Family Medicine

## 2024-02-21 NOTE — Telephone Encounter (Signed)
 Requesting referral to ENT due to on going sinus infection. Yellow, taste nasty, drainage

## 2024-02-22 ENCOUNTER — Other Ambulatory Visit: Payer: Self-pay | Admitting: Family Medicine

## 2024-02-22 DIAGNOSIS — J329 Chronic sinusitis, unspecified: Secondary | ICD-10-CM

## 2024-02-22 NOTE — Telephone Encounter (Signed)
 Pt states that he had already seen you for this about 2 months ado and you had tried him on antibiotics. It worked for 2 days and his symptoms returned. Pt already have a scheduled appointment with you in November. Does he need to be seen sooner or are you willing to send in more medication along with the referral? Please advise.

## 2024-03-14 ENCOUNTER — Ambulatory Visit: Admitting: Family Medicine

## 2024-03-14 ENCOUNTER — Encounter: Payer: Self-pay | Admitting: Family Medicine

## 2024-03-14 VITALS — BP 124/78 | HR 75 | Resp 16 | Ht 73.0 in | Wt 236.8 lb

## 2024-03-14 DIAGNOSIS — I10 Essential (primary) hypertension: Secondary | ICD-10-CM

## 2024-03-14 DIAGNOSIS — R739 Hyperglycemia, unspecified: Secondary | ICD-10-CM

## 2024-03-14 DIAGNOSIS — Z79899 Other long term (current) drug therapy: Secondary | ICD-10-CM

## 2024-03-14 DIAGNOSIS — J438 Other emphysema: Secondary | ICD-10-CM | POA: Diagnosis not present

## 2024-03-14 DIAGNOSIS — I771 Stricture of artery: Secondary | ICD-10-CM | POA: Diagnosis not present

## 2024-03-14 DIAGNOSIS — Z23 Encounter for immunization: Secondary | ICD-10-CM | POA: Diagnosis not present

## 2024-03-14 DIAGNOSIS — I700 Atherosclerosis of aorta: Secondary | ICD-10-CM

## 2024-03-14 DIAGNOSIS — M5442 Lumbago with sciatica, left side: Secondary | ICD-10-CM | POA: Diagnosis not present

## 2024-03-14 DIAGNOSIS — I25119 Atherosclerotic heart disease of native coronary artery with unspecified angina pectoris: Secondary | ICD-10-CM

## 2024-03-14 DIAGNOSIS — G4733 Obstructive sleep apnea (adult) (pediatric): Secondary | ICD-10-CM

## 2024-03-14 DIAGNOSIS — G8929 Other chronic pain: Secondary | ICD-10-CM

## 2024-03-14 DIAGNOSIS — F341 Dysthymic disorder: Secondary | ICD-10-CM

## 2024-03-14 DIAGNOSIS — M5441 Lumbago with sciatica, right side: Secondary | ICD-10-CM

## 2024-03-14 DIAGNOSIS — K219 Gastro-esophageal reflux disease without esophagitis: Secondary | ICD-10-CM

## 2024-03-14 MED ORDER — LOSARTAN POTASSIUM 50 MG PO TABS: 50.0000 mg | ORAL_TABLET | Freq: Every day | ORAL | 1 refills | Status: AC

## 2024-03-14 MED ORDER — DULOXETINE HCL 30 MG PO CPEP: 30.0000 mg | ORAL_CAPSULE | Freq: Every day | ORAL | 0 refills | Status: AC

## 2024-03-14 MED ORDER — ATORVASTATIN CALCIUM 40 MG PO TABS: 40.0000 mg | ORAL_TABLET | Freq: Every day | ORAL | 1 refills | Status: AC

## 2024-03-14 MED ORDER — TIZANIDINE HCL 4 MG PO TABS: 4.0000 mg | ORAL_TABLET | Freq: Every day | ORAL | 1 refills | Status: AC

## 2024-03-14 MED ORDER — OMEPRAZOLE 40 MG PO CPDR: 40.0000 mg | DELAYED_RELEASE_CAPSULE | Freq: Every day | ORAL | 1 refills | Status: AC

## 2024-03-14 NOTE — Progress Notes (Signed)
 Name: Wesley Adams   MRN: [id number]    DOB: 1965/08/05   Date:03/14/2024       Progress Note  Subjective  Chief Complaint  Chief Complaint  Patient presents with   Medical Management of Chronic Issues   Discussed the use of AI scribe software for clinical note transcription with the patient, who gave verbal consent to proceed.  History of Present Illness Wesley Adams is a 58 year old male with chronic low back pain who presents for follow-up and pain management.  He experiences chronic low back pain radiating down both legs, with the right leg more affected. The pain is accompanied by numbness, particularly when standing, sometimes leading to his right leg going numb. He has a history of a posterior laminectomy and decompression in 2016, a spinal cord stimulator placement in 2018, and its removal in 2019. He has not had surgery since then but has received injections for pain management.  He is currently taking tizanidine  at night, which helps him sleep better. He has previously tried gabapentin  and pregabalin  but could not tolerate them due to side effects such as insomnia. He is not taking methocarbamol, meloxicam , or Voltaren , which were prescribed for other issues. He has a history of using hydrocodone  for pain management but discontinued due to marijuana use, which affected his eligibility for opioid prescriptions.  He has coronary artery disease and is taking atorvastatin , aspirin , and metoprolol . No recent chest pain, palpitations, or shortness of breath. He also has a history of iliac artery stenosis and aortic atherosclerosis.  He has obstructive sleep apnea but is not currently using his CPAP machine due to severe sinus congestion. He has a history of emphysema and continues to smoke, although he reports reducing his intake to less than a pack a day. He experiences a chronic cough, which he attributes to sinus issues rather than lung problems.  He has hypertension and is  taking losartan . He also has a history of gastroesophageal reflux disease and is taking omeprazole . He reports occasional use of Cialis  for erectile dysfunction.  He has a history of prediabetes. He acknowledges difficulty in reducing soda consumption, particularly [facility], which he drinks regularly.    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   Lumbar radiculopathy 11/25/2020   Lumbar spondylosis 05/03/2018   Coronary artery disease involving native heart 12/12/2017   Iliac artery stenosis, left 12/06/2017   Atherosclerosis of aorta 12/06/2017   Chronic bilateral low back pain with bilateral sciatica 10/03/2017   Chronic pain syndrome 10/03/2017   Tobacco use 05/27/2017   Other intervertebral disc degeneration, lumbar region 01/30/2015    Past Surgical History:  Procedure Laterality Date   COLONOSCOPY WITH PROPOFOL  N/A 06/01/2017   Procedure: COLONOSCOPY WITH PROPOFOL ;  Surgeon: Unk Corinn Skiff, MD;  Location: [facility];  Service: Endoscopy;  Laterality: N/A;   ESOPHAGOGASTRODUODENOSCOPY (EGD) WITH PROPOFOL   06/01/2017   Procedure: ESOPHAGOGASTRODUODENOSCOPY (EGD) WITH PROPOFOL ;  Surgeon: Unk Corinn Skiff, MD;  Location: [facility];  Service: Endoscopy;;   FEMUR FRACTURE SURGERY Right 1985   he has a rod   LEFT HEART CATH AND CORONARY ANGIOGRAPHY N/A 12/08/2017   Procedure: LEFT  HEART CATH AND CORONARY ANGIOGRAPHY;  Surgeon: Hester Wolm PARAS, MD;  Location: [facility];  Service: Cardiovascular;  Laterality: N/A;   POLYPECTOMY  06/01/2017   Procedure: POLYPECTOMY;  Surgeon: Unk Corinn Skiff, MD;  Location: [facility];  Service: Endoscopy;;   POSTERIOR LAMINECTOMY / DECOMPRESSION LUMBAR SPINE  01/30/2015   with fusion, Dr. Mavis   SPINAL CORD STIMULATOR INSERTION  03/23/2017   Dr Reyes Mavis, [facility]   SPINAL CORD STIMULATOR REMOVAL N/A 06/16/2017   Procedure: SPINAL CORD STIMULATOR REMOVAL;  Surgeon: Mavis Reyes, MD;  Location: [facility] OR;  Service: Neurosurgery;  Laterality: N/A;    Family History  Problem Relation Age of Onset   Alcohol abuse Mother    Heart attack Father    Heart disease Father    Obesity Father    Heart attack Paternal Grandfather     Social History   Tobacco Use   Smoking status: Every Day    Current packs/day: 1.00    Average packs/day: 1 pack/day for 40.6 years (40.6 ttl pk-yrs)    Types: Cigarettes    Start date: 08/02/1983   Smokeless tobacco: Never  Substance Use Topics   Alcohol use: Yes    Alcohol/week: 0.0 standard drinks of alcohol    Comment: occasionally drinks beer     Current Outpatient Medications:    aspirin  EC 81 MG tablet, Take 1 tablet (81 mg total) by mouth daily. Swallow whole., Disp: , Rfl:    budesonide-glycopyrrolate -formoterol (BREZTRI  AEROSPHERE) 160-9-4.8 MCG/ACT AERO inhaler, Inhale 2 puffs into the lungs in the morning and at bedtime., Disp: 10.7 g, Rfl: 5   DULoxetine  (CYMBALTA ) 30 MG capsule, Take 1-2 capsules (30-60 mg total) by mouth daily., Disp: 30 capsule, Rfl: 0   fluticasone  (FLONASE ) 50 MCG/ACT nasal spray, Place 2 sprays into both nostrils daily., Disp: 16 g, Rfl: 0   metoprolol  tartrate (LOPRESSOR ) 25 MG tablet, Take 1 tablet (25 mg total) by mouth 2 (two) times daily., Disp: 180 tablet, Rfl: 3   nitroGLYCERIN  (NITROSTAT ) 0.4 MG SL tablet, Place 1 tablet (0.4 mg total) under the tongue every 5 (five) minutes as needed for chest pain., Disp: 25 tablet, Rfl: 6   tadalafil  (CIALIS ) 20 MG tablet, Take 0.5-1 tablets (10-20 mg total) by mouth every other day as needed for erectile dysfunction., Disp: 30 tablet,  Rfl: 1   atorvastatin  (LIPITOR) 40 MG tablet, Take 1 tablet (40 mg total) by mouth daily., Disp: 90 tablet, Rfl: 1   cetirizine  (ZYRTEC ) 10 MG tablet, Take 1 tablet (10 mg total) by mouth daily. (Patient not taking: Reported on 03/14/2024), Disp: 30 tablet, Rfl: 0   losartan  (COZAAR ) 50 MG tablet, Take 1 tablet (50 mg total) by mouth daily., Disp: 90 tablet, Rfl: 1   omeprazole  (PRILOSEC) 40 MG capsule, Take 1 capsule (40 mg total) by mouth daily., Disp: 90 capsule, Rfl: 1   tiZANidine  (ZANAFLEX ) 4 MG tablet, Take 1 tablet (4 mg total) by mouth at bedtime., Disp: 90 tablet, Rfl: 1  No Known Allergies  I personally reviewed active problem list, medication list, allergies, family history with the patient/caregiver today.   ROS  Ten systems reviewed and is negative except as mentioned in HPI    Objective Physical Exam  CONSTITUTIONAL: Patient appears well-developed and well-nourished.  No distress. HEENT: Head atraumatic, normocephalic, neck supple. CARDIOVASCULAR: Normal rate, regular rhythm and normal heart sounds.  No murmur heard. No BLE edema. PULMONARY: Effort normal and breath sounds normal. No respiratory  distress. ABDOMINAL: There is no tenderness or distention. MUSCULOSKELETAL: slow gait, pain during palpation of lumbar spine, positive straight leg raise PSYCHIATRIC: Patient has a normal mood and affect. behavior is normal. Judgment and thought content normal.  Vitals:   03/14/24 0859  BP: 124/78  Pulse: 75  Resp: 16  SpO2: 96%  Weight: 236 lb 12.8 oz (107.4 kg)  Height: 6' 1 (1.854 m)    Body mass index is 31.24 kg/m.   PHQ2/9:    03/14/2024    8:56 AM 12/12/2023   10:25 AM 11/24/2023    1:02 PM 09/09/2023   10:15 AM 06/13/2023    9:24 AM  Depression screen PHQ 2/9  Decreased Interest 0 0 0 0 0  Down, Depressed, Hopeless 0 0 0 0 0  PHQ - 2 Score 0 0 0 0 0  Altered sleeping 0 0  0 0  Tired, decreased energy 0 0  0 0  Change in appetite 0 0  0 0  Feeling bad  or failure about yourself  0 0  0 0  Trouble concentrating 0 0  0 0  Moving slowly or fidgety/restless 0 0  0 0  Suicidal thoughts 0 0  0 0  PHQ-9 Score 0 0   0  0   Difficult doing work/chores Not difficult at all Not difficult at all  Not difficult at all Not difficult at all     Data saved with a previous flowsheet row definition    phq 9 is negative  Fall Risk:    03/14/2024    8:56 AM 12/12/2023   10:24 AM 11/24/2023    1:02 PM 09/09/2023   10:15 AM 06/13/2023    9:24 AM  Fall Risk   Falls in the past year? 0 0 0 0 0  Number falls in past yr: 0 0 0 0 0  Injury with Fall? 0 0 0 0 0  Risk for fall due to : No Fall Risks No Fall Risks No Fall Risks No Fall Risks No Fall Risks  Follow up Falls evaluation completed Falls evaluation completed Falls evaluation completed Falls evaluation completed Falls prevention discussed;Education provided;Falls evaluation completed      Assessment & Plan Chronic low back pain with bilateral sciatica Chronic low back pain with bilateral sciatica, worsened by standing, causing right leg numbness. Multilevel arthritis limits surgical options. Previous intolerance to gabapentin  and pregabalin . Duloxetine  proposed for pain management. Referral to pain management pending. - Prescribed duloxetine  30 mg daily for one week, then increase to 60 mg daily if tolerated. - Referred to Dr. Marcelino  for chronic back pain management. - Advised to contact pain management clinic in [place] to expedite appointment. - Continue tizanidine  for muscle relaxation.  Atherosclerotic heart disease of native coronary artery, aortic atherosclerosis, and left iliac artery stenosis Atherosclerotic heart disease with coronary artery disease, aortic atherosclerosis, and left iliac artery stenosis. Managed with atorvastatin , aspirin , metoprolol , and losartan . Blood pressure well-controlled. Coronary calcium  score indicates advanced heart disease. - Continue atorvastatin  40 mg  daily. - Continue aspirin  daily. - Continue metoprolol  25 mg twice daily. - Continue losartan  50 mg daily. - Ordered CBC, comprehensive metabolic panel, and lipid panel. - Scheduled lung cancer screening.  Obstructive sleep apnea Non-compliance to CPAP due to sinus congestion. Sinus issues persist. - Referred to ENT for evaluation of sinus congestion. - Encouraged use of CPAP once sinus issues are addressed.  Emphysema with chronic cough Emphysema with chronic cough, likely exacerbated by sinus issues.  Smoking history with current reduction. Cough likely related to sinus issues. - Encouraged smoking cessation. - Advised to pick up inhaler from pharmacy and use as prescribed. - Scheduled lung cancer screening.  Gastroesophageal reflux disease (GERD) GERD managed with omeprazole . - Continue omeprazole  as prescribed.  Essential hypertension Well-controlled with current medication regimen. - Continue losartan  50 mg daily. - Continue metoprolol  25 mg twice daily.  Prediabetes Emphasis on dietary modifications to reduce carbohydrate intake. - Ordered A1c test. - Advised dietary modifications to reduce carbohydrate intake.  Dysthymia Symptoms of feeling down and frustrated due to chronic pain. Duloxetine  proposed for both pain and mood management. - Prescribed duloxetine  as outlined for chronic pain management.  Obesity Emphasis on reducing soda intake to aid weight management. - Advised gradual reduction of soda intake. - Encouraged increased water  consumption.

## 2024-03-15 ENCOUNTER — Ambulatory Visit: Payer: Self-pay | Admitting: Family Medicine

## 2024-03-15 LAB — COMPREHENSIVE METABOLIC PANEL WITH GFR
AG Ratio: 1.6 (calc) (ref 1.0–2.5)
ALT: 29 U/L (ref 9–46)
AST: 27 U/L (ref 10–35)
Albumin: 4.4 g/dL (ref 3.6–5.1)
Alkaline phosphatase (APISO): 62 U/L (ref 35–144)
BUN: 7 mg/dL (ref 7–25)
CO2: 28 mmol/L (ref 20–32)
Calcium: 9.4 mg/dL (ref 8.6–10.3)
Chloride: 102 mmol/L (ref 98–110)
Creat: 0.84 mg/dL (ref 0.70–1.30)
Globulin: 2.8 g/dL (ref 1.9–3.7)
Glucose, Bld: 90 mg/dL (ref 65–99)
Potassium: 4.3 mmol/L (ref 3.5–5.3)
Sodium: 139 mmol/L (ref 135–146)
Total Bilirubin: 0.5 mg/dL (ref 0.2–1.2)
Total Protein: 7.2 g/dL (ref 6.1–8.1)
eGFR: 101 mL/min/1.73m2 (ref >=60)

## 2024-03-15 LAB — CBC WITH DIFFERENTIAL/PLATELET
Absolute Lymphocytes: 2296 {cells}/uL (ref 850–3900)
Absolute Monocytes: 693 {cells}/uL (ref 200–950)
Basophils Absolute: 21 {cells}/uL (ref 0–200)
Basophils Relative: 0.3 %
Eosinophils Absolute: 70 {cells}/uL (ref 15–500)
Eosinophils Relative: 1 %
HCT: 45 % (ref 38.5–50.0)
Hemoglobin: 14.9 g/dL (ref 13.2–17.1)
MCH: 30.1 pg (ref 27.0–33.0)
MCHC: 33.1 g/dL (ref 32.0–36.0)
MCV: 90.9 fL (ref 80.0–100.0)
MPV: 11.4 fL (ref 7.5–12.5)
Monocytes Relative: 9.9 %
Neutro Abs: 3920 {cells}/uL (ref 1500–7800)
Neutrophils Relative %: 56 %
Platelets: 198 Thousand/uL (ref 140–400)
RBC: 4.95 Million/uL (ref 4.20–5.80)
RDW: 13.5 % (ref 11.0–15.0)
Total Lymphocyte: 32.8 %
WBC: 7 Thousand/uL (ref 3.8–10.8)

## 2024-03-15 LAB — LIPID PANEL
Cholesterol: 210 mg/dL — ABNORMAL HIGH (ref <200)
HDL: 53 mg/dL (ref >=40)
LDL Cholesterol (Calc): 134 mg/dL — ABNORMAL HIGH
Non-HDL Cholesterol (Calc): 157 mg/dL — ABNORMAL HIGH (ref <130)
Total CHOL/HDL Ratio: 4 (calc) (ref <5.0)
Triglycerides: 120 mg/dL (ref <150)

## 2024-03-15 LAB — HEMOGLOBIN A1C
Hgb A1c MFr Bld: 5.8 % — ABNORMAL HIGH (ref <5.7)
Mean Plasma Glucose: 120 mg/dL
eAG (mmol/L): 6.6 mmol/L

## 2024-04-09 ENCOUNTER — Ambulatory Visit

## 2024-05-31 ENCOUNTER — Other Ambulatory Visit: Payer: Self-pay

## 2024-05-31 DIAGNOSIS — N528 Other male erectile dysfunction: Secondary | ICD-10-CM

## 2024-05-31 MED ORDER — TADALAFIL 20 MG PO TABS: 10.0000 mg | ORAL_TABLET | ORAL | 0 refills | Status: AC | PRN

## 2024-06-14 ENCOUNTER — Ambulatory Visit: Admitting: Family Medicine
